# Patient Record
Sex: Male | Born: 1955
Health system: Southern US, Community
[De-identification: ages and names within clinical notes are randomized; demographics above are authoritative.]

## PROBLEM LIST (undated history)

## (undated) ENCOUNTER — Emergency Department (HOSPITAL_COMMUNITY): Payer: PPO

## (undated) DIAGNOSIS — R0600 Dyspnea, unspecified: Secondary | ICD-10-CM

## (undated) DIAGNOSIS — M1990 Unspecified osteoarthritis, unspecified site: Secondary | ICD-10-CM

## (undated) DIAGNOSIS — J189 Pneumonia, unspecified organism: Secondary | ICD-10-CM

## (undated) DIAGNOSIS — G43909 Migraine, unspecified, not intractable, without status migrainosus: Secondary | ICD-10-CM

## (undated) DIAGNOSIS — E7800 Pure hypercholesterolemia, unspecified: Secondary | ICD-10-CM

## (undated) DIAGNOSIS — I739 Peripheral vascular disease, unspecified: Secondary | ICD-10-CM

## (undated) DIAGNOSIS — I6529 Occlusion and stenosis of unspecified carotid artery: Secondary | ICD-10-CM

## (undated) DIAGNOSIS — N200 Calculus of kidney: Secondary | ICD-10-CM

## (undated) DIAGNOSIS — I214 Non-ST elevation (NSTEMI) myocardial infarction: Secondary | ICD-10-CM

## (undated) DIAGNOSIS — I25810 Atherosclerosis of coronary artery bypass graft(s) without angina pectoris: Secondary | ICD-10-CM

## (undated) DIAGNOSIS — Q2730 Arteriovenous malformation, site unspecified: Secondary | ICD-10-CM

## (undated) DIAGNOSIS — D500 Iron deficiency anemia secondary to blood loss (chronic): Secondary | ICD-10-CM

## (undated) DIAGNOSIS — I10 Essential (primary) hypertension: Secondary | ICD-10-CM

## (undated) DIAGNOSIS — I639 Cerebral infarction, unspecified: Secondary | ICD-10-CM

## (undated) DIAGNOSIS — Z86010 Personal history of colonic polyps: Secondary | ICD-10-CM

## (undated) DIAGNOSIS — I119 Hypertensive heart disease without heart failure: Secondary | ICD-10-CM

## (undated) DIAGNOSIS — D126 Benign neoplasm of colon, unspecified: Secondary | ICD-10-CM

## (undated) DIAGNOSIS — I209 Angina pectoris, unspecified: Secondary | ICD-10-CM

## (undated) DIAGNOSIS — Z8719 Personal history of other diseases of the digestive system: Secondary | ICD-10-CM

## (undated) DIAGNOSIS — E1110 Type 2 diabetes mellitus with ketoacidosis without coma: Secondary | ICD-10-CM

## (undated) DIAGNOSIS — E119 Type 2 diabetes mellitus without complications: Secondary | ICD-10-CM

## (undated) DIAGNOSIS — Z9289 Personal history of other medical treatment: Secondary | ICD-10-CM

## (undated) HISTORY — DX: Cerebral infarction, unspecified: I63.9

## (undated) HISTORY — DX: Peripheral vascular disease, unspecified: I73.9

## (undated) HISTORY — DX: Calculus of kidney: N20.0

## (undated) HISTORY — DX: Iron deficiency anemia secondary to blood loss (chronic): D50.0

## (undated) HISTORY — DX: Occlusion and stenosis of unspecified carotid artery: I65.29

## (undated) HISTORY — DX: Benign neoplasm of colon, unspecified: D12.6

## (undated) HISTORY — DX: Atherosclerosis of coronary artery bypass graft(s) without angina pectoris: I25.810

## (undated) HISTORY — DX: Non-ST elevation (NSTEMI) myocardial infarction: I21.4

## (undated) HISTORY — DX: Type 2 diabetes mellitus without complications: E11.9

## (undated) HISTORY — DX: Hypertensive heart disease without heart failure: I11.9

## (undated) HISTORY — DX: Angina pectoris, unspecified: I20.9

## (undated) HISTORY — DX: Personal history of colonic polyps: Z86.010

---

## 2005-10-04 HISTORY — PX: CORONARY ARTERY BYPASS GRAFT: SHX141

## 2006-04-04 ENCOUNTER — Ambulatory Visit: Payer: Self-pay | Admitting: Family Medicine

## 2006-04-04 ENCOUNTER — Ambulatory Visit: Payer: Self-pay | Admitting: *Deleted

## 2006-07-01 ENCOUNTER — Ambulatory Visit: Payer: Self-pay | Admitting: Family Medicine

## 2006-07-04 ENCOUNTER — Ambulatory Visit (HOSPITAL_COMMUNITY): Admission: RE | Admit: 2006-07-04 | Discharge: 2006-07-04 | Payer: Self-pay | Admitting: Family Medicine

## 2006-07-26 ENCOUNTER — Ambulatory Visit: Payer: Self-pay | Admitting: Family Medicine

## 2006-08-18 ENCOUNTER — Ambulatory Visit: Payer: Self-pay | Admitting: Family Medicine

## 2006-10-12 ENCOUNTER — Ambulatory Visit: Payer: Self-pay | Admitting: Family Medicine

## 2006-11-15 ENCOUNTER — Ambulatory Visit: Payer: Self-pay | Admitting: Family Medicine

## 2007-03-16 ENCOUNTER — Ambulatory Visit: Payer: Self-pay | Admitting: Family Medicine

## 2007-06-21 ENCOUNTER — Encounter (INDEPENDENT_AMBULATORY_CARE_PROVIDER_SITE_OTHER): Payer: Self-pay | Admitting: *Deleted

## 2007-12-18 ENCOUNTER — Ambulatory Visit: Payer: Self-pay | Admitting: Family Medicine

## 2007-12-18 LAB — CONVERTED CEMR LAB
ALT: 23 units/L (ref 0–53)
AST: 15 units/L (ref 0–37)
Alkaline Phosphatase: 72 units/L (ref 39–117)
CO2: 24 meq/L (ref 19–32)
Cholesterol: 116 mg/dL (ref 0–200)
Creatinine, Ser: 0.92 mg/dL (ref 0.40–1.50)
Free T4: 0.99 ng/dL (ref 0.89–1.80)
LDL Cholesterol: 66 mg/dL (ref 0–99)
PSA: 0.8 ng/mL (ref 0.10–4.00)
Sodium: 140 meq/L (ref 135–145)
TSH: 1.338 microintl units/mL (ref 0.350–5.50)
Total Bilirubin: 0.3 mg/dL (ref 0.3–1.2)
Total CHOL/HDL Ratio: 4.3
Total Protein: 6.5 g/dL (ref 6.0–8.3)
VLDL: 23 mg/dL (ref 0–40)

## 2008-01-24 ENCOUNTER — Ambulatory Visit: Payer: Self-pay | Admitting: Family Medicine

## 2008-09-20 ENCOUNTER — Ambulatory Visit: Payer: Self-pay | Admitting: Family Medicine

## 2008-10-24 ENCOUNTER — Encounter: Payer: Self-pay | Admitting: Family Medicine

## 2008-10-24 ENCOUNTER — Ambulatory Visit: Payer: Self-pay | Admitting: Family Medicine

## 2008-10-24 LAB — CONVERTED CEMR LAB
ALT: 18 units/L (ref 0–53)
AST: 17 units/L (ref 0–37)
Albumin: 3.9 g/dL (ref 3.5–5.2)
Alkaline Phosphatase: 62 units/L (ref 39–117)
BUN: 11 mg/dL (ref 6–23)
Basophils Absolute: 0 10*3/uL (ref 0.0–0.1)
Basophils Relative: 0 % (ref 0–1)
Calcium: 8.9 mg/dL (ref 8.4–10.5)
Chloride: 107 meq/L (ref 96–112)
Eosinophils Absolute: 0.2 10*3/uL (ref 0.0–0.7)
HDL: 36 mg/dL — ABNORMAL LOW (ref >39)
LDL Cholesterol: 129 mg/dL — ABNORMAL HIGH (ref 0–99)
MCHC: 32.2 g/dL (ref 30.0–36.0)
MCV: 86.5 fL (ref 78.0–100.0)
Monocytes Relative: 11 % (ref 3–12)
Neutro Abs: 1.8 10*3/uL (ref 1.7–7.7)
Neutrophils Relative %: 36 % — ABNORMAL LOW (ref 43–77)
PSA: 0.58 ng/mL (ref 0.10–4.00)
Platelets: 211 10*3/uL (ref 150–400)
Potassium: 4.3 meq/L (ref 3.5–5.3)
RDW: 15.6 % — ABNORMAL HIGH (ref 11.5–15.5)
TSH: 0.998 microintl units/mL (ref 0.350–4.50)
Total CHOL/HDL Ratio: 5.3

## 2008-12-19 ENCOUNTER — Ambulatory Visit: Payer: Self-pay | Admitting: Family Medicine

## 2009-09-18 ENCOUNTER — Emergency Department (HOSPITAL_COMMUNITY): Admission: EM | Admit: 2009-09-18 | Discharge: 2009-09-18 | Payer: Self-pay | Admitting: Family Medicine

## 2014-04-12 ENCOUNTER — Encounter (HOSPITAL_COMMUNITY): Payer: Self-pay | Admitting: Emergency Medicine

## 2014-04-12 ENCOUNTER — Emergency Department (INDEPENDENT_AMBULATORY_CARE_PROVIDER_SITE_OTHER)
Admission: EM | Admit: 2014-04-12 | Discharge: 2014-04-12 | Disposition: A | Discharge status: Home / Self Care | Payer: PRIVATE HEALTH INSURANCE | Source: Home / Self Care | Attending: Emergency Medicine | Admitting: Emergency Medicine

## 2014-04-12 DIAGNOSIS — R05 Cough: Secondary | ICD-10-CM

## 2014-04-12 DIAGNOSIS — N400 Enlarged prostate without lower urinary tract symptoms: Secondary | ICD-10-CM

## 2014-04-12 DIAGNOSIS — R059 Cough, unspecified: Secondary | ICD-10-CM

## 2014-04-12 DIAGNOSIS — E7800 Pure hypercholesterolemia, unspecified: Secondary | ICD-10-CM

## 2014-04-12 DIAGNOSIS — I10 Essential (primary) hypertension: Secondary | ICD-10-CM

## 2014-04-12 HISTORY — DX: Essential (primary) hypertension: I10

## 2014-04-12 HISTORY — DX: Pure hypercholesterolemia, unspecified: E78.00

## 2014-04-12 LAB — POCT I-STAT, CHEM 8
BUN: 7 mg/dL (ref 6–23)
CHLORIDE: 104 meq/L (ref 96–112)
CREATININE: 1.1 mg/dL (ref 0.50–1.35)
Calcium, Ion: 1.3 mmol/L — ABNORMAL HIGH (ref 1.12–1.23)
Glucose, Bld: 78 mg/dL (ref 70–99)
HCT: 45 % (ref 39.0–52.0)
Hemoglobin: 15.3 g/dL (ref 13.0–17.0)
POTASSIUM: 4.1 meq/L (ref 3.7–5.3)
SODIUM: 140 meq/L (ref 137–147)
TCO2: 27 mmol/L (ref 0–100)

## 2014-04-12 MED ORDER — LISINOPRIL 5 MG PO TABS: 5.0000 mg | ORAL_TABLET | Freq: Every day | ORAL | Status: DC

## 2014-04-12 MED ORDER — LOVASTATIN 40 MG PO TABS: 40.0000 mg | ORAL_TABLET | Freq: Every day | ORAL | Status: DC

## 2014-04-12 MED ORDER — TERAZOSIN HCL 1 MG PO CAPS: 1.0000 mg | ORAL_CAPSULE | Freq: Every day | ORAL | Status: DC

## 2014-04-12 MED ORDER — HYDROCHLOROTHIAZIDE 12.5 MG PO TABS: 12.5000 mg | ORAL_TABLET | Freq: Every day | ORAL | Status: DC

## 2014-04-12 MED ORDER — FLUTICASONE PROPIONATE 50 MCG/ACT NA SUSP: 2.0000 | Freq: Every day | NASAL | Status: DC

## 2014-04-12 MED ORDER — FEXOFENADINE HCL 180 MG PO TABS: 180.0000 mg | ORAL_TABLET | Freq: Every day | ORAL | Status: DC

## 2014-04-12 MED ORDER — DICLOFENAC SODIUM 75 MG PO TBEC: 75.0000 mg | DELAYED_RELEASE_TABLET | Freq: Two times a day (BID) | ORAL | Status: DC

## 2014-04-12 MED ORDER — METOPROLOL TARTRATE 50 MG PO TABS: 50.0000 mg | ORAL_TABLET | Freq: Two times a day (BID) | ORAL | Status: DC

## 2014-04-12 NOTE — ED Notes (Signed)
Pt     Reports       He  Is  Out of  His     Fluid  Pill  And  bp  Pill  He has  Not  Had      Timely  PCP   followup               He  Is  Awake  And  Alert  And  Oriented   At  This  Time

## 2014-04-12 NOTE — Discharge Instructions (Signed)
Blood pressure over the ideal can put you at higher risk for stroke, heart disease, and kidney failure.  For this reason, it's important to try to get your blood pressure as close as possible to the ideal.  The ideal blood pressure is 120/80.  Blood pressures from 469-629 systolic over 52-84 diastolic are labeled as "prehypertension."  This means you are at higher risk of developing hypertension in the future.  Blood pressures in this range are not treated with medication, but lifestyle changes are recommended to prevent progression to hypertension.  Blood pressures of 132 and above systolic over 90 and above diastolic are classified as hypertension and are treated with medications.  Lifestyle changes which can benefit both prehypertension and hypertension include the following:   Salt and sodium restriction.  Weight loss.  Regular exercise.  Avoidance of tobacco.  Avoidance of excess alcohol.  The "D.A.S.H" diet.   People with hypertension and prehypertension should limit their salt intake to less than 1500 mg daily.  Reading the nutrition information on the label of many prepared foods can give you an idea of how much sodium you're consuming at each meal.  Remember that the most important number on the nutrition information is the serving size.  It may be smaller than you think.  Try to avoid adding extra salt at the table.  You may add small amounts of salt while cooking.  Remember that salt is an acquired taste and you may get used to a using a whole lot less salt than you are using now.  Using less salt lets the food's natural flavors come through.  You might want to consider using salt substitutes, potassium chloride, pepper, or blends of herbs and spices to enhance the flavor of your food.  Foods that contain the most salt include: processed meats (like ham, bacon, lunch meat, sausage, hot dogs, and breakfast meat), chips, pretzels, salted nuts, soups, salty snacks, canned foods, junk  food, fast food, restaurant food, mustard, pickles, pizza, popcorn, soy sauce, and worcestershire sauce--quite a list!  You might ask, "Is there anything I can eat?"  The answer is, "yes."  Fruits and vegetables are usually low in salt.  Fresh is better than frozen which is better than canned.  If you have canned vegetables, you can cut down on the salt content by rinsing them in tap water 3 times before cooking.     Weight loss is the second thing you can do to lower your blood pressure.  Getting to and maintaining ideal weight will often normalize your blood pressure and allow you to avoid medications, entirely, cut way down on your dosage of medications, or allow to wean off your meds.  (Note, this should only be done under the supervision of your primary care doctor.)  Of course, weight loss takes time and you may need to be on medication in the meantime.  You shoot for a body mass index of 20-25.  When you go to the urgent care or to your primary care doctor, they should calculate your BMI.  If you don't know what it is, ask.  You can calculate your BMI with the following formula:  Weight in pounds x 703/ (height in inches) x (height in inches).  There are many good diets out there: Weight Watchers and the D.A.S.H. Diet are the best, but often, just modifying a few factors can be helpful:  Don't skip meals, don't eat out, and keeping a food diary.  I do not recommend  fad diets or diet pills which often raise blood pressure.  ° °· Everyone should get regular exercise, but this is particularly important for people with high blood pressure.  Just about any exercise is good.  The only exercise which may be harmful is lifting extreme heavy weights.  I recommend moderate exercise such as walking for 30 minutes 5 days a week.  Going to the gym for a 50 minute workout 3 times a week is also good.  This amounts to 150 minutes of exercise weekly. ° °· Anyone with high blood pressure should avoid any use of tobacco.   Tobacco use does not elevate blood pressure, but it increases the risk of heart disease and stroke.  If you are interested in quitting, discuss with your doctor how to quit.  If you are not interested in quitting, ask yourself, "What would my life be like in 10 years if I continue to smoke?"  "How will I know when it is time to quit?"  "How would my life be better if I were to quit." ° °· Excess alcohol intake can raise the blood pressure.  The safe alcohol intake is 2 drinks or less per day for men and 1 drink per day or less for women. ° °· There is a very good diet which I recommend that has been designed for people with blood pressure called the D.A.S.H. Diet (dietary approaches to stop hypertension).  It consists of fruits, vegetables, lean meats, low fat dairy, whole grains, nuts and seeds.  It is very low in salt and sodium.  It has also been found to have other beneficial health effects such as lowering cholesterol and helping lose weight.  It has been developed by the National Institutes of Health and can be downloaded from the internet without any cost. Just do a web search on "D.A.S.H. Diet." or go the NIH website (www.nih.gov).  There are also cookbooks and diet plans that can be gotten from [employer] to help you with this diet. ° °DASH Eating Plan °DASH stands for "Dietary Approaches to Stop Hypertension." The DASH eating plan is a healthy eating plan that has been shown to reduce high blood pressure (hypertension). Additional health benefits may include reducing the risk of type 2 diabetes mellitus, heart disease, and stroke. The DASH eating plan may also help with weight loss. °WHAT DO I NEED TO KNOW ABOUT THE DASH EATING PLAN? °For the DASH eating plan, you will follow these general guidelines: °· Choose foods with a percent daily value for sodium of less than 5% (as listed on the food label). °· Use salt-free seasonings or herbs instead of table salt or sea salt. °· Check with your health care provider  or pharmacist before using salt substitutes. °· Eat lower-sodium products, often labeled as "lower sodium" or "no salt added." °· Eat fresh foods. °· Eat more vegetables, fruits, and low-fat dairy products. °· Choose whole grains. Look for the word "whole" as the first word in the ingredient list. °· Choose fish and skinless chicken or turkey more often than red meat. Limit fish, poultry, and meat to 6 oz (170 g) each day. °· Limit sweets, desserts, sugars, and sugary drinks. °· Choose heart-healthy fats. °· Limit cheese to 1 oz (28 g) per day. °· Eat more home-cooked food and less restaurant, buffet, and fast food. °· Limit fried foods. °· Cook foods using methods other than frying. °· Limit canned vegetables. If you do use them, rinse them well to decrease   the sodium.  When eating at a restaurant, ask that your food be prepared with less salt, or no salt if possible. WHAT FOODS CAN I EAT? Seek help from a dietitian for individual calorie needs. Grains Whole grain or whole wheat bread. Brown rice. Whole grain or whole wheat pasta. Quinoa, bulgur, and whole grain cereals. Low-sodium cereals. Corn or whole wheat flour tortillas. Whole grain cornbread. Whole grain crackers. Low-sodium crackers. Vegetables Fresh or frozen vegetables (raw, steamed, roasted, or grilled). Low-sodium or reduced-sodium tomato and vegetable juices. Low-sodium or reduced-sodium tomato sauce and paste. Low-sodium or reduced-sodium canned vegetables.  Fruits All fresh, canned (in natural juice), or frozen fruits. Meat and Other Protein Products Ground beef (85% or leaner), grass-fed beef, or beef trimmed of fat. Skinless chicken or [place]. Ground chicken or [place]. Pork trimmed of fat. All fish and seafood. Eggs. Dried beans, peas, or lentils. Unsalted nuts and seeds. Unsalted canned beans. Dairy Low-fat dairy products, such as skim or 1% milk, 2% or reduced-fat cheeses, low-fat ricotta or cottage cheese, or plain low-fat yogurt.  Low-sodium or reduced-sodium cheeses. Fats and Oils Tub margarines without trans fats. Light or reduced-fat mayonnaise and salad dressings (reduced sodium). Avocado. Safflower, olive, or canola oils. Natural peanut or almond butter. Other Unsalted popcorn and pretzels. The items listed above may not be a complete list of recommended foods or beverages. Contact your dietitian for more options. WHAT FOODS ARE NOT RECOMMENDED? Grains White bread. White pasta. White rice. Refined cornbread. Bagels and croissants. Crackers that contain trans fat. Vegetables Creamed or fried vegetables. Vegetables in a cheese sauce. Regular canned vegetables. Regular canned tomato sauce and paste. Regular tomato and vegetable juices. Fruits Dried fruits. Canned fruit in light or heavy syrup. Fruit juice. Meat and Other Protein Products Fatty cuts of meat. Ribs, chicken wings, bacon, sausage, bologna, salami, chitterlings, fatback, hot dogs, bratwurst, and packaged luncheon meats. Salted nuts and seeds. Canned beans with salt. Dairy Whole or 2% milk, cream, half-and-half, and cream cheese. Whole-fat or sweetened yogurt. Full-fat cheeses or blue cheese. Nondairy creamers and whipped toppings. Processed cheese, cheese spreads, or cheese curds. Condiments Onion and garlic salt, seasoned salt, table salt, and sea salt. Canned and packaged gravies. Worcestershire sauce. Tartar sauce. Barbecue sauce. Teriyaki sauce. Soy sauce, including reduced sodium. Steak sauce. Fish sauce. Oyster sauce. Cocktail sauce. Horseradish. Ketchup and mustard. Meat flavorings and tenderizers. Bouillon cubes. Hot sauce. Tabasco sauce. Marinades. Taco seasonings. Relishes. Fats and Oils Butter, stick margarine, lard, shortening, ghee, and bacon fat. Coconut, palm kernel, or palm oils. Regular salad dressings. Other Pickles and olives. Salted popcorn and pretzels. The items listed above may not be a complete list of foods and beverages to avoid.  Contact your dietitian for more information. WHERE CAN I FIND MORE INFORMATION? National Heart, Lung, and Blood Institute: travelstabloid.com Document Released: 09/09/2011 Document Revised: 09/25/2013 Document Reviewed: 07/25/2013 [facility] Patient Information 2015 [place], [place]. This information is not intended to replace advice given to you by your health care provider. Make sure you discuss any questions you have with your health care provider.

## 2014-04-12 NOTE — ED Provider Notes (Signed)
Chief Complaint   Chief Complaint  Patient presents with  . Medication Refill    History of Present Illness   Jason Velasquez is a 58 year old male who comes in today for refills on some medications. He is on hydrochlorothiazide 12.5 mg once a day and lisinopril 5 mg a day for his blood pressure as well as metoprolol tartarate 50 mg twice a day. 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 He denies any side effects from his medications. Specifically his head no dizziness, cough, muscle aches, syncope, presyncope, or abdominal pain. He denies any headache, he states his vision is sometimes blurry, he is occasionally short of breath although he describes this as being more nasal congestion, he denies any coughing or wheezing. There's been no chest pain. He has occasional dizzy spells. Also he notes numbness of his tongue at times. He's had a one-month history of sneezing, nasal congestion, rhinorrhea, and intermittent cough.  Review of Systems     Other than as noted above, the patient denies any of the following symptoms: Systemic:  No fever, chills, fatigue, myalgias, headache, or anorexia. Eye:  No redness, pain or drainage. ENT:  No earache, nasal congestion, rhinorrhea, sinus pressure, or sore throat. Lungs:  No cough, sputum production, wheezing, shortness of breath.  Cardiovascular:  No chest pain, palpitations, or syncope. GI:  No nausea, vomiting, abdominal pain or diarrhea. GU:  No dysuria, frequency, or hematuria. Skin:  No rash or pruritis.   [place]     Past medical history, family history, social history, meds, and allergies were reviewed.    Physical Examination    Vital signs:  BP 147/91  Pulse 82  Temp(Src) 98 F (36.7 C) (Oral)  Resp 14  SpO2 99% General:  Alert, in no distress. Eye:  PERRL, full EOMs.  Lids and conjunctivas were normal. ENT:  TMs and canals  were normal, without erythema or inflammation.  Nasal mucosa was clear and uncongested, without drainage.  Mucous membranes were moist.  Pharynx was clear, without exudate or drainage.  There were no oral ulcerations or lesions. Neck:  Supple, no adenopathy, tenderness or mass. Thyroid was normal. Lungs:  No respiratory distress.  Lungs were clear to auscultation, without wheezes, rales or rhonchi.  Breath sounds were clear and equal bilaterally. Heart:  Regular rhythm, without gallops, murmers or rubs. Abdomen:  Soft, flat, and non-tender to palpation.  No hepatosplenomagaly or mass. Skin:  Clear, warm, and dry, without rash or lesions.  Labs    Results for orders placed during the hospital encounter of 04/12/14  POCT I-STAT, CHEM 8      Result Value Ref Range   Sodium 140  137 - 147 mEq/L   Potassium 4.1  3.7 - 5.3 mEq/L   Chloride 104  96 - 112 mEq/L   BUN 7  6 - 23 mg/dL   Creatinine, Ser 1.10  0.50 - 1.35 mg/dL   Glucose, Bld 78  70 - 99 mg/dL   Calcium, Ion 1.30 (*) 1.12 - 1.23 mmol/L   TCO2 27  0 - 100 mmol/L   Hemoglobin 15.3  13.0 - 17.0 g/dL   HCT 45.0  39.0 - 52.0 %    Assessment   The primary encounter diagnosis was Essential hypertension. Diagnoses of Hypercholesterolemia, BPH (benign prostatic hyperplasia), and Cough were also pertinent to this visit.  The patient  was provided with enough refills to get him through the next several months and suggest [facility].  Plan     1.  Meds:  The following meds were prescribed:   Discharge Medication List as of 04/12/2014  6:08 PM    START taking these medications   Details  diclofenac (VOLTAREN) 75 MG EC tablet Take 1 tablet (75 mg total) by mouth 2 (two) times daily., Starting 04/12/2014, Until Discontinued, Normal    fexofenadine (ALLEGRA) 180 MG tablet Take 1 tablet (180 mg total) by mouth daily., Starting 04/12/2014, Until Discontinued, Normal    fluticasone (FLONASE) 50 MCG/ACT nasal  spray Place 2 sprays into both nostrils daily., Starting 04/12/2014, Until Discontinued, Normal    hydrochlorothiazide (HYDRODIURIL) 12.5 MG tablet Take 1 tablet (12.5 mg total) by mouth daily., Starting 04/12/2014, Until Discontinued, Normal    !! lisinopril (PRINIVIL,ZESTRIL) 5 MG tablet Take 1 tablet (5 mg total) by mouth daily., Starting 04/12/2014, Until Discontinued, Normal    !! lovastatin (MEVACOR) 40 MG tablet Take 1 tablet (40 mg total) by mouth at bedtime., Starting 04/12/2014, Until Discontinued, Normal    !! metoprolol (LOPRESSOR) 50 MG tablet Take 1 tablet (50 mg total) by mouth 2 (two) times daily., Starting 04/12/2014, Until Discontinued, Normal    terazosin (HYTRIN) 1 MG capsule Take 1 capsule (1 mg total) by mouth at bedtime., Starting 04/12/2014, Until Discontinued, Normal     !! - Potential duplicate medications found. Please discuss with provider.      2.  Patient Education/Counseling:  The patient was given appropriate handouts, self care instructions, and instructed in symptomatic relief.    3.  Follow up:  The patient was told to follow up here if no better in 3 to 4 days, or sooner if becoming worse in any way, and given some red flag symptoms such as headache, vision changes, shortness of breath, chest pain, or dizziness which would prompt immediate return.  [facility].        Harden Mo, MD 04/12/14 2213

## 2014-06-25 ENCOUNTER — Ambulatory Visit: Payer: Self-pay

## 2014-06-25 ENCOUNTER — Ambulatory Visit (INDEPENDENT_AMBULATORY_CARE_PROVIDER_SITE_OTHER): Payer: No Typology Code available for payment source | Admitting: Internal Medicine

## 2014-06-25 ENCOUNTER — Encounter: Payer: Self-pay | Admitting: Internal Medicine

## 2014-06-25 VITALS — BP 138/87 | HR 68 | Temp 98.0°F | Ht 70.5 in | Wt 207.0 lb

## 2014-06-25 DIAGNOSIS — I209 Angina pectoris, unspecified: Secondary | ICD-10-CM

## 2014-06-25 DIAGNOSIS — I25708 Atherosclerosis of coronary artery bypass graft(s), unspecified, with other forms of angina pectoris: Secondary | ICD-10-CM

## 2014-06-25 DIAGNOSIS — Z0000 Encounter for general adult medical examination without abnormal findings: Secondary | ICD-10-CM

## 2014-06-25 DIAGNOSIS — I10 Essential (primary) hypertension: Secondary | ICD-10-CM

## 2014-06-25 DIAGNOSIS — I25810 Atherosclerosis of coronary artery bypass graft(s) without angina pectoris: Secondary | ICD-10-CM

## 2014-06-25 HISTORY — DX: Atherosclerosis of coronary artery bypass graft(s) without angina pectoris: I25.810

## 2014-06-25 LAB — LIPID PANEL
CHOL/HDL RATIO: 6 ratio
Cholesterol: 162 mg/dL (ref 0–200)
HDL: 27 mg/dL — ABNORMAL LOW (ref >39)
LDL CALC: 61 mg/dL (ref 0–99)
Triglycerides: 370 mg/dL — ABNORMAL HIGH (ref <150)
VLDL: 74 mg/dL — AB (ref 0–40)

## 2014-06-25 LAB — COMPLETE METABOLIC PANEL WITH GFR
ALT: 46 U/L (ref 0–53)
AST: 28 U/L (ref 0–37)
Albumin: 4.1 g/dL (ref 3.5–5.2)
Alkaline Phosphatase: 68 U/L (ref 39–117)
BUN: 13 mg/dL (ref 6–23)
CALCIUM: 9.5 mg/dL (ref 8.4–10.5)
CHLORIDE: 100 meq/L (ref 96–112)
CO2: 27 meq/L (ref 19–32)
Creat: 1.2 mg/dL (ref 0.50–1.35)
GFR, EST AFRICAN AMERICAN: 76 mL/min
GFR, Est Non African American: 66 mL/min
Glucose, Bld: 112 mg/dL — ABNORMAL HIGH (ref 70–99)
POTASSIUM: 4.1 meq/L (ref 3.5–5.3)
SODIUM: 138 meq/L (ref 135–145)
TOTAL PROTEIN: 6.6 g/dL (ref 6.0–8.3)
Total Bilirubin: 0.4 mg/dL (ref 0.2–1.2)

## 2014-06-25 LAB — GLUCOSE, CAPILLARY: Glucose-Capillary: 120 mg/dL — ABNORMAL HIGH (ref 70–99)

## 2014-06-25 LAB — POCT GLYCOSYLATED HEMOGLOBIN (HGB A1C): Hemoglobin A1C: 6.5

## 2014-06-25 LAB — TSH: TSH: 1.582 u[IU]/mL (ref 0.350–4.500)

## 2014-06-25 MED ORDER — METOPROLOL TARTRATE 50 MG PO TABS: 50.0000 mg | ORAL_TABLET | Freq: Two times a day (BID) | ORAL | Status: DC

## 2014-06-25 MED ORDER — LOVASTATIN 40 MG PO TABS: 40.0000 mg | ORAL_TABLET | Freq: Every day | ORAL | Status: DC

## 2014-06-25 MED ORDER — TERAZOSIN HCL 1 MG PO CAPS: 1.0000 mg | ORAL_CAPSULE | Freq: Every day | ORAL | Status: DC

## 2014-06-25 MED ORDER — ASPIRIN 81 MG PO TABS: 81.0000 mg | ORAL_TABLET | Freq: Every day | ORAL | Status: DC

## 2014-06-25 MED ORDER — HYDROCHLOROTHIAZIDE 12.5 MG PO TABS: 12.5000 mg | ORAL_TABLET | Freq: Every day | ORAL | Status: DC

## 2014-06-25 NOTE — Patient Instructions (Addendum)
General Instructions: We will be doing some blood work on you today.  We will also like to get information form your former PCPs office to update our records since you will be establishing care here.  Thank you for bringing your medicines today. This helps [place] keep you safe from mistakes.    Exercise to Lose Weight Exercise and a healthy diet may help you lose weight. Your doctor may suggest specific exercises. EXERCISE IDEAS AND TIPS  Choose low-cost things you enjoy doing, such as walking, bicycling, or exercising to workout videos.  Take stairs instead of the elevator.  Walk during your lunch break.  Park your car further away from work or school.  Go to a gym or an exercise class.  Start with 5 to 10 minutes of exercise each day. Build up to 30 minutes of exercise 4 to 6 days a week.  Wear shoes with good support and comfortable clothes.  Stretch before and after working out.  Work out until you breathe harder and your heart beats faster.  Drink extra water when you exercise.  Do not do so much that you hurt yourself, feel dizzy, or get very short of breath. Exercises that burn about 150 calories:  Running 1  miles in 15 minutes.  Playing volleyball for 45 to 60 minutes.  Washing and waxing a car for 45 to 60 minutes.  Playing touch football for 45 minutes.  Walking 1  miles in 35 minutes.  Pushing a stroller 1  miles in 30 minutes.  Playing basketball for 30 minutes.  Raking leaves for 30 minutes.  Bicycling 5 miles in 30 minutes.  Walking 2 miles in 30 minutes.  Dancing for 30 minutes.  Shoveling snow for 15 minutes.  Swimming laps for 20 minutes.  Walking up stairs for 15 minutes.  Bicycling 4 miles in 15 minutes.  Gardening for 30 to 45 minutes.  Jumping rope for 15 minutes.  Washing windows or floors for 45 to 60 minutes.

## 2014-06-25 NOTE — Assessment & Plan Note (Signed)
Pt refused flu shot, never had it and doesn't want to start now. Get records from PCP on colonoscopy.

## 2014-06-25 NOTE — Assessment & Plan Note (Addendum)
BP Readings from Last 3 Encounters:  06/25/14 138/87  04/12/14 147/91    Lab Results  Component Value Date   NA 140 04/12/2014   K 4.1 04/12/2014   CREATININE 1.10 04/12/2014    Assessment: Blood pressure control:  Controlled Progress toward BP goal:   At goal Comments: Currently on Lisinopril- 5mg  daily, HCTZ- 12.5 mg daily, Metoprolol tartrate - 50mg  BID, also on terazosin- 1mg  daily ( No hx of BPH or prostate issues in the past).  Plan: Medications:  continue current medications Educational resources provided:   Self management tools provided:   Other plans: CMEt today. - TSH - Request records from former PCP. Pt signed release of information paper work today. - UA. - meet with deb Hill to sort out orange card and insurance.

## 2014-06-25 NOTE — Progress Notes (Signed)
Patient ID: Jason Velasquez, male   DOB: June 05, 1956, 59 y.o.   MRN: [id number]   Subjective:   Patient ID: Jason Velasquez male   DOB: Sep 12, 1956 58 y.o.   MRN: [id number]  HPI: Jason Velasquez is a 58 y.o. presented here today as a new patient to establish care here- Previous PCP- Dr Amalia Hailey blount, pt couldnt afford his PCP and had trouble seeing him and making appointments. He is currently unemployed. Pt therefore will like to establish care here. Pt has a past medical hx of HTN, CAD- s/p CADG in 2007, and hypercholesterolemia. Pt is very complaint with his medication. Was in the ED- 04/12/2014 to ask for refill of his medication, as he had run out.  Pt today is in good health, except for ben tired during the day, most days, which is very unusual for him, denies snoring at night, constipation, or skin changes, no cold intolerance, without chest pains, SOB, cough , headaches, fever, dysuria or frequency. Denies dizziness or falls. Takes flonase for occasional allergies. Denies diabetes hx.   HTN- Checks his blood pressure at home. Usually < 967R systolic. Denies dizziness. On metop, Lisinopril, terazosin, and HCTZ.  CAD- Had CABG- 2007, saw a cardiologist- about 6 months after his procedure. Has not followed up with them ever since. Quit smoking 2011, and has not smoked since then. Currently on Lisinopril, Aspirin, metop- tartrate. Doesn't take alcohol and doesn't use illicit drugs.   Past Medical History  Diagnosis Date  . Hypertension   . Hypercholesterolemia    Current Outpatient Prescriptions  Medication Sig Dispense Refill  . aspirin 81 MG tablet Take 1 tablet (81 mg total) by mouth daily.  30 tablet  5  . diclofenac (VOLTAREN) 75 MG EC tablet Take 1 tablet (75 mg total) by mouth 2 (two) times daily.  60 tablet  0  . fexofenadine (ALLEGRA) 180 MG tablet Take 1 tablet (180 mg total) by mouth daily.  30 tablet  3  . fluticasone (FLONASE) 50 MCG/ACT nasal spray Place 2 sprays into both nostrils daily.   16 g  3  . hydrochlorothiazide (HYDRODIURIL) 12.5 MG tablet Take 1 tablet (12.5 mg total) by mouth daily.  30 tablet  5  . lisinopril (PRINIVIL,ZESTRIL) 5 MG tablet Take 1 tablet (5 mg total) by mouth daily.  30 tablet  5  . lovastatin (MEVACOR) 40 MG tablet Take 1 tablet (40 mg total) by mouth at bedtime.  30 tablet  5  . metoprolol (LOPRESSOR) 50 MG tablet Take 1 tablet (50 mg total) by mouth 2 (two) times daily.  60 tablet  5  . NIACIN PO Take by mouth.      . terazosin (HYTRIN) 1 MG capsule Take 1 capsule (1 mg total) by mouth at bedtime.  30 capsule  5   No current facility-administered medications for this visit.   Family History  Problem Relation Age of Onset  . Hypertension Mother   . Diabetes Father   . Hypertension Sister    History   Social History  . Marital Status: Divorced    Spouse Name: N/A    Number of Children: N/A  . Years of Education: N/A   Social History Main Topics  . Smoking status: Former Smoker    Quit date: 10/04/2009  . Smokeless tobacco: None  . Alcohol Use: No  . Drug Use: None  . Sexual Activity: None   Other Topics Concern  . None   Social History Narrative  . None  Review of Systems: CONSTITUTIONAL- No Fever, weightloss, night sweat or change in appetite. SKIN- No Rash, colour changes or itching. HEAD- No Headache or dizziness. Mouth/throat- No Sorethroat, dentures, or bleeding gums. RESPIRATORY- No Cough or SOB. CARDIAC- No Palpitations, DOE, PND or chest pain. GI- No nausea, vomiting, diarrhoea, constipation, abd pain. URINARY- No Frequency, urgency, straining or dysuria. NEUROLOGIC- No Numbness, syncope, seizures or burning. [facility]- Denies depression or anxiety symptoms.  Objective:  Physical Exam: Filed Vitals:   06/25/14 0957  BP: 138/87  Pulse: 68  Temp: 98 F (36.7 C)  TempSrc: Oral  Height: 5' 10.5" (1.791 m)  Weight: 207 lb (93.895 kg)  SpO2: 99%   GENERAL- alert, pleasant , co-operative, appears as stated age,  not in any distress. HEENT- Atraumatic, normocephalic, PERRL, EOMI, oral mucosa appears moist, no cervical LN enlargement, thyroid does not appear enlarged, neck supple. CARDIAC- RRR, no murmurs, rubs or gallops. RESP- Moving equal volumes of air, and clear to auscultation bilaterally, no wheezes or crackles. ABDOMEN- Soft, nontender, no guarding or rebound, no palpable masses or organomegaly, bowel sounds present. BACK- Normal curvature of the spine, No tenderness along the vertebrae, no CVA tenderness. NEURO- No obvious Cr N abnormality, strenght upper and lower extremities- 5/5, Gait- Normal. EXTREMITIES- pulse 2+, symmetric, no pedal edema. SKIN- Warm, dry, No rash or lesion. PSYCH- Normal mood and affect, appropriate thought content and speech.  Assessment & Plan:  The patient's case and plan of care was discussed with attending physician, Dr. Eppie Gibson.  Please see problem based charting for assessment and plan.

## 2014-06-25 NOTE — Assessment & Plan Note (Signed)
Currently stable. On aspirin- 81mg  daily,Metop- tartrate- 50mg  BID, lisinopril- 5mg  daily. No chest pains. Pt say she has not had lab work done in over a year.  Plan- Refill of all medications given to patient. - Explained how our clinic operates and that he will see his PCP hopefully next visit. - Will check Hgba1c to rule out diabetes. - Lipid profile.

## 2014-06-26 LAB — URINALYSIS, ROUTINE W REFLEX MICROSCOPIC
Bilirubin Urine: NEGATIVE
GLUCOSE, UA: NEGATIVE mg/dL
Hgb urine dipstick: NEGATIVE
Ketones, ur: NEGATIVE mg/dL
LEUKOCYTES UA: NEGATIVE
Nitrite: NEGATIVE
PH: 5.5 (ref 5.0–8.0)
Protein, ur: NEGATIVE mg/dL
SPECIFIC GRAVITY, URINE: 1.027 (ref 1.005–1.030)
Urobilinogen, UA: 0.2 mg/dL (ref 0.0–1.0)

## 2014-06-26 NOTE — Progress Notes (Signed)
Case discussed with Dr. Emokpae soon after the resident saw the patient. We reviewed the resident's history and exam and pertinent patient test results. I agree with the assessment, diagnosis, and plan of care documented in the resident's note. 

## 2014-07-04 ENCOUNTER — Emergency Department (HOSPITAL_COMMUNITY): Payer: Self-pay

## 2014-07-04 ENCOUNTER — Emergency Department (HOSPITAL_COMMUNITY)
Admission: EM | Admit: 2014-07-04 | Discharge: 2014-07-04 | Disposition: A | Discharge status: Home / Self Care | Payer: Self-pay | Attending: Emergency Medicine | Admitting: Emergency Medicine

## 2014-07-04 ENCOUNTER — Encounter (HOSPITAL_COMMUNITY): Payer: Self-pay | Admitting: Emergency Medicine

## 2014-07-04 DIAGNOSIS — R200 Anesthesia of skin: Secondary | ICD-10-CM

## 2014-07-04 DIAGNOSIS — I6789 Other cerebrovascular disease: Secondary | ICD-10-CM | POA: Insufficient documentation

## 2014-07-04 DIAGNOSIS — Z7982 Long term (current) use of aspirin: Secondary | ICD-10-CM | POA: Insufficient documentation

## 2014-07-04 DIAGNOSIS — Z7951 Long term (current) use of inhaled steroids: Secondary | ICD-10-CM | POA: Insufficient documentation

## 2014-07-04 DIAGNOSIS — E780 Pure hypercholesterolemia: Secondary | ICD-10-CM | POA: Insufficient documentation

## 2014-07-04 DIAGNOSIS — Z791 Long term (current) use of non-steroidal anti-inflammatories (NSAID): Secondary | ICD-10-CM | POA: Insufficient documentation

## 2014-07-04 DIAGNOSIS — I10 Essential (primary) hypertension: Secondary | ICD-10-CM | POA: Insufficient documentation

## 2014-07-04 DIAGNOSIS — Z951 Presence of aortocoronary bypass graft: Secondary | ICD-10-CM | POA: Insufficient documentation

## 2014-07-04 DIAGNOSIS — R202 Paresthesia of skin: Secondary | ICD-10-CM

## 2014-07-04 DIAGNOSIS — Z87891 Personal history of nicotine dependence: Secondary | ICD-10-CM | POA: Insufficient documentation

## 2014-07-04 DIAGNOSIS — Z9889 Other specified postprocedural states: Secondary | ICD-10-CM | POA: Insufficient documentation

## 2014-07-04 DIAGNOSIS — Z79899 Other long term (current) drug therapy: Secondary | ICD-10-CM | POA: Insufficient documentation

## 2014-07-04 LAB — URINALYSIS, ROUTINE W REFLEX MICROSCOPIC
BILIRUBIN URINE: NEGATIVE
GLUCOSE, UA: NEGATIVE mg/dL
HGB URINE DIPSTICK: NEGATIVE
Ketones, ur: NEGATIVE mg/dL
Leukocytes, UA: NEGATIVE
Nitrite: NEGATIVE
PH: 5.5 (ref 5.0–8.0)
Protein, ur: NEGATIVE mg/dL
SPECIFIC GRAVITY, URINE: 1.02 (ref 1.005–1.030)
Urobilinogen, UA: 0.2 mg/dL (ref 0.0–1.0)

## 2014-07-04 LAB — CBC WITH DIFFERENTIAL/PLATELET
BASOS ABS: 0 10*3/uL (ref 0.0–0.1)
Basophils Relative: 0 % (ref 0–1)
EOS ABS: 0.1 10*3/uL (ref 0.0–0.7)
Eosinophils Relative: 3 % (ref 0–5)
HCT: 40.2 % (ref 39.0–52.0)
Hemoglobin: 13.3 g/dL (ref 13.0–17.0)
LYMPHS ABS: 2.1 10*3/uL (ref 0.7–4.0)
LYMPHS PCT: 46 % (ref 12–46)
MCH: 27.3 pg (ref 26.0–34.0)
MCHC: 33.1 g/dL (ref 30.0–36.0)
MCV: 82.5 fL (ref 78.0–100.0)
Monocytes Absolute: 0.4 10*3/uL (ref 0.1–1.0)
Monocytes Relative: 10 % (ref 3–12)
NEUTROS PCT: 41 % — AB (ref 43–77)
Neutro Abs: 1.8 10*3/uL (ref 1.7–7.7)
PLATELETS: 164 10*3/uL (ref 150–400)
RBC: 4.87 MIL/uL (ref 4.22–5.81)
RDW: 14.4 % (ref 11.5–15.5)
WBC: 4.4 10*3/uL (ref 4.0–10.5)

## 2014-07-04 LAB — COMPREHENSIVE METABOLIC PANEL
ALK PHOS: 66 U/L (ref 39–117)
ALT: 39 U/L (ref 0–53)
AST: 27 U/L (ref 0–37)
Albumin: 3.8 g/dL (ref 3.5–5.2)
Anion gap: 13 (ref 5–15)
BUN: 17 mg/dL (ref 6–23)
CALCIUM: 9 mg/dL (ref 8.4–10.5)
CO2: 24 mEq/L (ref 19–32)
Chloride: 102 mEq/L (ref 96–112)
Creatinine, Ser: 1.24 mg/dL (ref 0.50–1.35)
GFR calc non Af Amer: 62 mL/min — ABNORMAL LOW (ref >90)
GFR, EST AFRICAN AMERICAN: 72 mL/min — AB (ref >90)
GLUCOSE: 95 mg/dL (ref 70–99)
POTASSIUM: 4.2 meq/L (ref 3.7–5.3)
SODIUM: 139 meq/L (ref 137–147)
TOTAL PROTEIN: 7.1 g/dL (ref 6.0–8.3)
Total Bilirubin: 0.4 mg/dL (ref 0.3–1.2)

## 2014-07-04 LAB — CBG MONITORING, ED: GLUCOSE-CAPILLARY: 103 mg/dL — AB (ref 70–99)

## 2014-07-04 MED ORDER — KETOROLAC TROMETHAMINE 0.5 % OP SOLN
1.0000 [drp] | Freq: Four times a day (QID) | OPHTHALMIC | Status: DC
Start: 2014-07-04 — End: 2014-07-04

## 2014-07-04 MED ORDER — ERYTHROMYCIN 5 MG/GM OP OINT: 1.0000 "application " | TOPICAL_OINTMENT | Freq: Once | OPHTHALMIC | Status: DC

## 2014-07-04 NOTE — Discharge Instructions (Signed)
Continue taking 325 mg of aspirin daily for stroke prophylaxis. Followup with neurology. Return to the ER if you develop any sudden weakness, change in vision, slurred speech, loss of function, severe headache, nausea, vomiting, chest pain, shortness of breath.   Stroke Prevention Some medical conditions and behaviors are associated with an increased chance of having a stroke. You may prevent a stroke by making healthy choices and managing medical conditions. HOW CAN I REDUCE MY RISK OF HAVING A STROKE?   Stay physically active. Get at least 30 minutes of activity on most or all days.  Do not smoke. It may also be helpful to avoid exposure to secondhand smoke.  Limit alcohol use. Moderate alcohol use is considered to be:  No more than 2 drinks per day for men.  No more than 1 drink per day for nonpregnant women.  Eat healthy foods. This involves:  Eating 5 or more servings of fruits and vegetables a day.  Making dietary changes that address high blood pressure (hypertension), high cholesterol, diabetes, or obesity.  Manage your cholesterol levels.  Making food choices that are high in fiber and low in saturated fat, trans fat, and cholesterol may control cholesterol levels.  Take any prescribed medicines to control cholesterol as directed by your health care provider.  Manage your diabetes.  Controlling your carbohydrate and sugar intake is recommended to manage diabetes.  Take any prescribed medicines to control diabetes as directed by your health care provider.  Control your hypertension.  Making food choices that are low in salt (sodium), saturated fat, trans fat, and cholesterol is recommended to manage hypertension.  Take any prescribed medicines to control hypertension as directed by your health care provider.  Maintain a healthy weight.  Reducing calorie intake and making food choices that are low in sodium, saturated fat, trans fat, and cholesterol are recommended  to manage weight.  Stop drug abuse.  Avoid taking birth control pills.  Talk to your health care provider about the risks of taking birth control pills if you are over 22 years old, smoke, get migraines, or have ever had a blood clot.  Get evaluated for sleep disorders (sleep apnea).  Talk to your health care provider about getting a sleep evaluation if you snore a lot or have excessive sleepiness.  Take medicines only as directed by your health care provider.  For some people, aspirin or blood thinners (anticoagulants) are helpful in reducing the risk of forming abnormal blood clots that can lead to stroke. If you have the irregular heart rhythm of atrial fibrillation, you should be on a blood thinner unless there is a good reason you cannot take them.  Understand all your medicine instructions.  Make sure that other conditions (such as anemia or atherosclerosis) are addressed. SEEK IMMEDIATE MEDICAL CARE IF:   You have sudden weakness or numbness of the face, arm, or leg, especially on one side of the body.  Your face or eyelid droops to one side.  You have sudden confusion.  You have trouble speaking (aphasia) or understanding.  You have sudden trouble seeing in one or both eyes.  You have sudden trouble walking.  You have dizziness.  You have a loss of balance or coordination.  You have a sudden, severe headache with no known cause.  You have new chest pain or an irregular heartbeat. Any of these symptoms may represent a serious problem that is an emergency. Do not wait to see if the symptoms will go away. Get medical  help at once. Call your local emergency services (911 in [place] Do not drive yourself to the hospital. Document Released: 10/28/2004 Document Revised: 02/04/2014 Document Reviewed: 03/23/2013 [facility] Patient Information 2015 [place], [place]. This information is not intended to replace advice given to you by your health care provider. Make sure you discuss  any questions you have with your health care provider.

## 2014-07-04 NOTE — Discharge Planning (Signed)
[place] [facility]   Patient is a current orange card holder at [facility]. Follow up appointment made with the practice for  Friday Oct 9,2015 at 3:45pm, pt verbalized understanding of the appointment. My contact information provided for any future questions or concerns. No other needs identified at this time.

## 2014-07-04 NOTE — ED Provider Notes (Signed)
CSN: [id number]     Arrival date & time 07/04/14  7371 History   First MD Initiated Contact with Patient 07/04/14 0935     Chief Complaint  Patient presents with  . Weakness     (Consider location/radiation/quality/duration/timing/severity/associated sxs/prior Treatment) HPI Mr. Fauver is a 58 year old male with past medical history of hypertension, hypercholesterolemia who presents to the ER today with one day of numbness. Patient states she's eating dinner last night, and noticed the numbness began in his mouth. Patient states he was "unable to taste on the left side of his mouth". He stated the numbness gradually progressed into his neck, trunk and left leg, sparing his left arm. He reports mild associated, intermittent headache this morning, and states the headache resolves spontaneously. Patient also reports some mild associated weakness in his left leg, however he has continued to be able to bear weight, and has since not affected his gait. He also reports some mild associated dizziness, and states this has been ongoing for "a long time". Patient also states that 8 years ago he experienced similar symptoms with the numbness in the left side of his body in the mouth which resolved spontaneously. Patient states he did not seek any treatment at that time. Patient denies any associated fever, nausea, vomiting, chest pain, shortness of breath, syncope, blurred vision. Patient denies history of cancer, IV drug use, saddle anesthesia.   Past Medical History  Diagnosis Date  . Hypertension   . Hypercholesterolemia    Past Surgical History  Procedure Laterality Date  . Cardiac surgery    . Coronary artery bypass graft     Family History  Problem Relation Age of Onset  . Hypertension Mother   . Diabetes Father   . Hypertension Sister    History  Substance Use Topics  . Smoking status: Former Smoker    Quit date: 10/04/2009  . Smokeless tobacco: Not on file  . Alcohol Use: No     Review of Systems  Constitutional: Negative for fever.  HENT: Negative for trouble swallowing.   Eyes: Negative for visual disturbance.  Respiratory: Negative for shortness of breath.   Cardiovascular: Negative for chest pain.  Gastrointestinal: Negative for nausea, vomiting and abdominal pain.  Genitourinary: Negative for dysuria.  Musculoskeletal: Negative for neck pain.  Skin: Negative for rash.  Neurological: Positive for numbness and headaches. Negative for dizziness, syncope, facial asymmetry, weakness and light-headedness.  Psychiatric/Behavioral: Negative.       Allergies  Review of patient's allergies indicates no known allergies.  Home Medications   Prior to Admission medications   Medication Sig Start Date End Date Taking? Authorizing Provider  aspirin EC 325 MG tablet Take 325 mg by mouth daily.   Yes Historical Provider, MD  diclofenac (VOLTAREN) 75 MG EC tablet Take 1 tablet (75 mg total) by mouth 2 (two) times daily. 04/12/14  Yes Harden Mo, MD  fexofenadine (ALLEGRA) 180 MG tablet Take 1 tablet (180 mg total) by mouth daily. 04/12/14  Yes Harden Mo, MD  fluticasone (FLONASE) 50 MCG/ACT nasal spray Place 2 sprays into both nostrils daily. 04/12/14  Yes Harden Mo, MD  hydrochlorothiazide (HYDRODIURIL) 12.5 MG tablet Take 1 tablet (12.5 mg total) by mouth daily. 06/25/14  Yes Ejiroghene E Emokpae, MD  lisinopril (PRINIVIL,ZESTRIL) 5 MG tablet Take 1 tablet (5 mg total) by mouth daily. 04/12/14  Yes Harden Mo, MD  lovastatin (MEVACOR) 40 MG tablet Take 1 tablet (40 mg total) by mouth at bedtime. 06/25/14  Yes Ejiroghene Arlyce Dice, MD  metoprolol (LOPRESSOR) 50 MG tablet Take 1 tablet (50 mg total) by mouth 2 (two) times daily. 06/25/14  Yes Ejiroghene E Emokpae, MD  NIACIN PO Take 1 tablet by mouth daily.    Yes Historical Provider, MD  terazosin (HYTRIN) 1 MG capsule Take 1 capsule (1 mg total) by mouth at bedtime. 06/25/14  Yes Ejiroghene Arlyce Dice, MD   aspirin 81 MG tablet Take 1 tablet (81 mg total) by mouth daily. 06/25/14   Ejiroghene E Emokpae, MD   BP 156/92  Pulse 74  Temp(Src) 98 F (36.7 C) (Oral)  Resp 13  SpO2 100% Physical Exam  Constitutional: He appears well-developed and well-nourished. No distress.  HENT:  Head: Normocephalic and atraumatic.  Mouth/Throat: Oropharynx is clear and moist. No oropharyngeal exudate.  Eyes: Right eye exhibits no discharge. Left eye exhibits no discharge. No scleral icterus.  Neck: Normal range of motion.  Cardiovascular: Normal rate, regular rhythm and normal heart sounds.   No murmur heard. Pulmonary/Chest: Effort normal and breath sounds normal. No respiratory distress.  Abdominal: Soft. There is no tenderness.  Musculoskeletal: Normal range of motion. He exhibits no edema and no tenderness.  Neurological: He has normal strength. No cranial nerve deficit or sensory deficit. He displays a negative Romberg sign. Coordination and gait normal. GCS eye subscore is 4. GCS verbal subscore is 5. GCS motor subscore is 6.  Patient fully alert answering questions appropriately in full, clear sentences. No slurred speech. Motor strength 5 out of 5 in all major muscle groups of upper and lower extremities. Cranial nerves II through XII grossly intact. Finger to nose normal. Patient has normal sensation to bilateral lower extremities to soft, sharp, dull touch.  Skin: Skin is warm and dry. No rash noted. He is not diaphoretic.  Psychiatric: He has a normal mood and affect.    ED Course  Procedures (including critical care time) Labs Review Labs Reviewed  CBC WITH DIFFERENTIAL - Abnormal; Notable for the following:    Neutrophils Relative % 41 (*)    All other components within normal limits  COMPREHENSIVE METABOLIC PANEL - Abnormal; Notable for the following:    GFR calc non Af Amer 62 (*)    GFR calc Af Amer 72 (*)    All other components within normal limits  CBG MONITORING, ED - Abnormal;  Notable for the following:    Glucose-Capillary 103 (*)    All other components within normal limits  URINALYSIS, ROUTINE W REFLEX MICROSCOPIC    Imaging Review Ct Head Wo Contrast  07/04/2014   CLINICAL DATA:  Onset of left-sided weakness and numbness beginning yesterday; difficulty walking ; no neurologic deficits currently  EXAM: CT HEAD WITHOUT CONTRAST  TECHNIQUE: Contiguous axial images were obtained from the base of the skull through the vertex without intravenous contrast.  COMPARISON:  Noncontrast CT scan of the brain dated July 04, 2006  FINDINGS: The ventricles are normal in size and position. There is an old lacunar infarction in the anterior limb of the left internal capsule. There is slightly decreased density superior and medial to this which may reflect subacute or acute ischemic change. There is no acute intracranial hemorrhage. The cerebellum and brainstem exhibit no acute abnormalities. There is an old anterior left cerebellar lacune.  The observed paranasal sinuses and mastoid air cells are clear. There is no skull fracture.  IMPRESSION: *There is an old lacunar infarction in the right cerebellar hemisphere and in the anterior limb  of the left internal capsule. Slightly decreased density just above the old lacunar infarction in the left internal capsule may reflect an area of acute ischemic change. *There is no acute intracranial hemorrhage nor intracranial mass effect. *Follow-up noncontrast CT scanning or MRI is recommended. * These results were called by telephone at the time of interpretation on 07/04/2014 at 12:32 pm to Dr. Vanita Panda, who verbally acknowledged these results.   Electronically Signed   By: David  Martinique   On: 07/04/2014 12:35   Mr Brain Wo Contrast  07/04/2014   CLINICAL DATA:  Onset of left-sided weakness and numbness beginning yesterday. Difficulty walking.  EXAM: MRI HEAD WITHOUT CONTRAST  TECHNIQUE: Multiplanar, multiecho pulse sequences of the brain and  surrounding structures were obtained without intravenous contrast.  COMPARISON:  Head CT 07/04/2014  FINDINGS: Images are mildly degraded by motion artifact.  There is no evidence of acute infarct, mass, midline shift, or extra-axial fluid collection. There is a remote infarct in the left basal ganglia with associated chronic blood products. Focus of remote microhemorrhage is noted in the left thalamus/ posterior limb of the left internal capsule. Remote, small infarcts are noted in the right cerebellum. Foci of T2 hyperintensity throughout the subcortical and deep cerebral white matter bilaterally are nonspecific but compatible with mild chronic small vessel ischemic disease, mildly advanced for age. There is mild cerebral and cerebellar atrophy.  Orbits are unremarkable. Paranasal sinuses and mastoid air cells are clear. Major intracranial vascular flow voids are preserved.  IMPRESSION: 1. No acute intracranial abnormality. 2. 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 3. Mild chronic small vessel ischemic disease and cerebral atrophy.   Electronically Signed   By: Logan Bores   On: 07/04/2014 14:32     EKG Interpretation None      MDM   Final diagnoses:  Numbness and tingling  Generalized ischemic cerebrovascular disease    Left-sided numbness starting yesterday in patient's mouth with difficulty tasting, and numbness radiating to patient's left leg, sparing his left arm. Patient with no obvious neuro deficit on exam today, alert, oriented, answering questions in full clear sentences, in no acute distress. Will workup with rule out of CVA, TIA. Patient does not meet any criteria for code stroke.   Patient's labs unremarkable. No leukocytosis, no anemia--no concern for B12 deficiency. Renal function within normal limits.   CT head without contrast impression: IMPRESSION: *There is an old lacunar infarction in the right cerebellar hemisphere and in the anterior limb of  the left internal capsule. Slightly decreased density just above the old lacunar infarction in the left internal capsule may reflect an area of acute ischemic change. *There is no acute intracranial hemorrhage nor intracranial mass effect. *Follow-up noncontrast CT scanning or MRI is recommended.   MR brain without contrast impression:  1. No acute intracranial abnormality. 2. 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 3. Mild chronic small vessel ischemic disease and cerebral atrophy.  Patient remaining fully alert, with normal neuro exam. Patient asymptomatic during entire ED stay. Based on results of CT and MRI, benign neuro exam and asymptomatic stay in the ED, we will have patient followup with neurology outpatient. I gave patient strict return precautions and spent time educating on signs and symptoms of CVA/TIA. I recommended patient continue 325 mg aspirin for stroke prophylaxis. I strongly encouraged patient to follow up with neurology and return to the ER if his symptoms return, worsen, change or should he have any  of the discussed aforementioned symptoms included in return precautions.  BP 156/92  Pulse 74  Temp(Src) 98 F (36.7 C) (Oral)  Resp 13  SpO2 100%  Signed,  Dahlia Bailiff, PA-C 5:21 PM  This patient seen and discussed with Dr. Carmin Muskrat, M.D.    Carrie Mew, PA-C 07/04/14 1721

## 2014-07-04 NOTE — ED Notes (Signed)
Pt c/o left sided weakness and numbness starting yesterday; pt sts some difficulty walking and numbness to left side of tongue; pt with no obvious neuro deficits at present

## 2014-07-04 NOTE — ED Notes (Signed)
Pt. Reports that his lt. Arm numbness is gone. He reports having numbness to his lt.cheek area very mild

## 2014-07-05 NOTE — ED Provider Notes (Signed)
  This was a shared visit with a mid-level provided (NP or PA).  Throughout the patient's course I was available for consultation/collaboration.  I saw the ECG (if appropriate), relevant labs and studies - I agree with the interpretation.  On my exam the patient was in no distress.  He had no ongoing substantial neurologic complaints, and his neurologic exam was unremarkable.  Given his history of prior stroke, CT, MRI were performed. The MRI was reassuring. Given the absence of objective neurologic deficits, the patient was discharged in stable condition to follow up with primary care, with a mitral prophylaxis via full-strength aspirin.        Carmin Muskrat, MD 07/05/14 Kathyrn Drown

## 2014-07-12 ENCOUNTER — Encounter: Payer: Self-pay | Admitting: Internal Medicine

## 2014-07-12 ENCOUNTER — Ambulatory Visit (INDEPENDENT_AMBULATORY_CARE_PROVIDER_SITE_OTHER): Payer: Self-pay | Admitting: Internal Medicine

## 2014-07-12 VITALS — BP 140/90 | HR 99 | Temp 98.4°F | Ht 70.5 in | Wt 211.7 lb

## 2014-07-12 DIAGNOSIS — Z8673 Personal history of transient ischemic attack (TIA), and cerebral infarction without residual deficits: Secondary | ICD-10-CM | POA: Insufficient documentation

## 2014-07-12 DIAGNOSIS — I25708 Atherosclerosis of coronary artery bypass graft(s), unspecified, with other forms of angina pectoris: Secondary | ICD-10-CM

## 2014-07-12 DIAGNOSIS — R7309 Other abnormal glucose: Secondary | ICD-10-CM

## 2014-07-12 DIAGNOSIS — I10 Essential (primary) hypertension: Secondary | ICD-10-CM

## 2014-07-12 DIAGNOSIS — I639 Cerebral infarction, unspecified: Secondary | ICD-10-CM

## 2014-07-12 DIAGNOSIS — R7303 Prediabetes: Secondary | ICD-10-CM | POA: Insufficient documentation

## 2014-07-12 DIAGNOSIS — I25810 Atherosclerosis of coronary artery bypass graft(s) without angina pectoris: Secondary | ICD-10-CM

## 2014-07-12 HISTORY — DX: Cerebral infarction, unspecified: I63.9

## 2014-07-12 LAB — POCT GLYCOSYLATED HEMOGLOBIN (HGB A1C): HEMOGLOBIN A1C: 6.4

## 2014-07-12 LAB — GLUCOSE, CAPILLARY: Glucose-Capillary: 114 mg/dL — ABNORMAL HIGH (ref 70–99)

## 2014-07-12 MED ORDER — ASPIRIN 81 MG PO TABS: ORAL_TABLET | ORAL | Status: DC

## 2014-07-12 MED ORDER — LISINOPRIL 20 MG PO TABS: 20.0000 mg | ORAL_TABLET | Freq: Every day | ORAL | Status: DC

## 2014-07-12 NOTE — Assessment & Plan Note (Signed)
Seen in the ED on 07/04/2014 for stroke symptoms. It seems like it was a TIA but brain MRI revealed remote infarct in the right cerebellum and left basal ganglion. No neurologic deficits and his neurologic exam is normal today. LDL is 61(on statin therapy). A1c 6.4%. TSH 1.582. Plan -Patient has been doubling his 81 mg aspirin, which I encouraged him to continue. -I discussed with him that Clopidogrel would be a better antiplatelet for him but is unable to afford it at this time. It is not on the [employer] $4 list. -referral to neurologist. Has orange card -Due to his risk factors for stroke, will tighten his treatment for hypertension. I will increase lisinopril  from 5 mg daily to 20 mg daily. -For his pre-diabetes, however, encouraged him to continue with lifestyle modifications with followup.

## 2014-07-12 NOTE — Patient Instructions (Signed)
General Instructions: Please increase Lisinopril from 5 mg to 20 mg daily  Please come back in 2-4 weeks to see your regular doctor Please take aspirin as before  We will refer you to neurology   Please bring your medicines with you each time you come to clinic.  Medicines may include prescription medications, over-the-counter medications, herbal remedies, eye drops, vitamins, or other pills. Please bring your medicines with you each time you come.   Medicines may be  Eye drops  Herbal   Vitamins  Pills  Seeing these help [place] take care of you.  Progress Toward Treatment Goals:  Treatment Goal 07/12/2014  Blood pressure unchanged    Self Care Goals & Plans:  Self Care Goal 07/12/2014  Manage my medications take my medicines as prescribed; bring my medications to every visit; refill my medications on time; follow the sick day instructions if I am sick    No flowsheet data found.   Care Management & Community Referrals:  Referral 07/12/2014  Referrals made for care management support none needed

## 2014-07-12 NOTE — Assessment & Plan Note (Signed)
Currently asymptomatic. Has not seen a cardiologist for a while. He will need to be referred once insurance issues have been sorted out.

## 2014-07-12 NOTE — Progress Notes (Signed)
Patient ID: Jason Velasquez, male   DOB: 1956/08/24, 58 y.o.   MRN: [id number]   Subjective:   HPI: [name] is a 58 y.o. gentleman with PMH of HTN, CAD- s/p CADG in 2007, and hypercholesterolemia and remote CVA seen on brain MRI recently.     Reason(s) for this visit: ED followup for stroke: He was evaluated in the ED on 07/04/2014 for left sided numbness and tingling associated with weakness, which resolved shortly. Head CT scan did not reveal acute intracranial process, but brain MRI revealed remote infarct in the right cerebellum and left basal ganglia. There was also noted mild chronic small vessel ischemic disease and cerebral atrophy. Patient currently feels well and without neurological symptoms. Symptoms resolved in the emergency department. He was encouraged to see neurology as outpatient. In the emergency department, he was discharged on aspirin 325 mg daily, but instead he has been taking 2 pills of aspirin 81 mg due to cost   Patient has significant problems with affording his medications. He recently got the orange card and is collection coupons to assist pay for his medications.   Please see the A&P for the status of the pt's chronic medical problems.  ROS: Constitutional: Denies fever, chills, diaphoresis, appetite change and fatigue.  Respiratory: Denies SOB, DOE, cough, chest tightness, and wheezing. Denies chest pain. CVS: No chest pain, palpitations and leg swelling.  GI: No abdominal pain, nausea, vomiting, bloody stools GU: No dysuria, frequency, hematuria, or flank pain.  MSK: No myalgias, back pain, joint swelling, arthralgias  Psych: No depression symptoms. No SI or SA.    Objective:  Physical Exam: Filed Vitals:   07/12/14 1616  BP: 140/90  Pulse: 99  Temp: 98.4 F (36.9 C)  TempSrc: Oral  Height: 5' 10.5" (1.791 m)  Weight: 211 lb 11.2 oz (96.026 kg)  SpO2: 100%   General: Well nourished. No acute distress.  HEENT: Normal oral mucosa. MMM.  Lungs:  CTA bilaterally. Heart: RRR; no extra sounds or murmurs  Abdomen: Non-distended, normal bowel sounds, soft, nontender; no hepatosplenomegaly  Extremities: No pedal edema. No joint swelling or tenderness. Neurologic: Normal EOM,  pupils are equal and round, and reactive to light. Alert and oriented x3. No focal neurological symptoms. Sensation is intact. Assessment & Plan:  Discussed case with my attending in the clinic, Dr. Lynnae January. See problem based charting.

## 2014-07-12 NOTE — Assessment & Plan Note (Addendum)
BP Readings from Last 3 Encounters:  07/12/14 140/90  07/04/14 156/92  06/25/14 138/87    Lab Results  Component Value Date   NA 139 07/04/2014   K 4.2 07/04/2014   CREATININE 1.24 07/04/2014    Assessment: Blood pressure control: mildly elevated Progress toward BP goal:  unchanged Comments: compliant   Plan: Medications:  Increase lisinopril from 5 mg daily to 20 mg daily. Continue with terazosin 1 mg at bedtime, metoprolol 50 mg twice daily, and hydrochlorothiazide 12.5 mg daily. Educational resources provided:   Self management tools provided:   Other plans: If blood pressure is not well controlled on next visit, we can increase hydrochlorothiazide to 50 mg daily.  Follow up in 2-3 weeks with PCP

## 2014-07-12 NOTE — Assessment & Plan Note (Signed)
Cont with lifestyle modification. Will be followed up closely.

## 2014-07-15 NOTE — Progress Notes (Signed)
Internal Medicine Clinic Attending  Case discussed with Dr. Kazibwe soon after the resident saw the patient.  We reviewed the resident's history and exam and pertinent patient test results.  I agree with the assessment, diagnosis, and plan of care documented in the resident's note. 

## 2014-08-12 ENCOUNTER — Encounter: Payer: Self-pay | Admitting: *Deleted

## 2014-09-11 NOTE — Addendum Note (Signed)
Addended by: Hulan Fray on: 09/11/2014 05:48 PM   Modules accepted: Orders

## 2014-11-27 ENCOUNTER — Telehealth: Payer: Self-pay | Admitting: Internal Medicine

## 2014-11-27 NOTE — Telephone Encounter (Signed)
Call to patient to confirm appointment for 11/28/14 at 2:15. Phone number is invalid

## 2014-11-28 ENCOUNTER — Ambulatory Visit (INDEPENDENT_AMBULATORY_CARE_PROVIDER_SITE_OTHER): Payer: Self-pay | Admitting: Internal Medicine

## 2014-11-28 ENCOUNTER — Encounter: Payer: Self-pay | Admitting: Internal Medicine

## 2014-11-28 VITALS — BP 113/94 | HR 96 | Temp 97.4°F | Wt 197.6 lb

## 2014-11-28 DIAGNOSIS — Z8673 Personal history of transient ischemic attack (TIA), and cerebral infarction without residual deficits: Secondary | ICD-10-CM

## 2014-11-28 DIAGNOSIS — H538 Other visual disturbances: Secondary | ICD-10-CM | POA: Insufficient documentation

## 2014-11-28 DIAGNOSIS — I10 Essential (primary) hypertension: Secondary | ICD-10-CM

## 2014-11-28 DIAGNOSIS — R7309 Other abnormal glucose: Secondary | ICD-10-CM

## 2014-11-28 DIAGNOSIS — I25810 Atherosclerosis of coronary artery bypass graft(s) without angina pectoris: Secondary | ICD-10-CM

## 2014-11-28 DIAGNOSIS — I25708 Atherosclerosis of coronary artery bypass graft(s), unspecified, with other forms of angina pectoris: Secondary | ICD-10-CM

## 2014-11-28 DIAGNOSIS — R7303 Prediabetes: Secondary | ICD-10-CM

## 2014-11-28 DIAGNOSIS — Z7982 Long term (current) use of aspirin: Secondary | ICD-10-CM

## 2014-11-28 MED ORDER — LOVASTATIN 20 MG PO TABS: 20.0000 mg | ORAL_TABLET | Freq: Every day | ORAL | Status: DC

## 2014-11-28 MED ORDER — LISINOPRIL-HYDROCHLOROTHIAZIDE 10-12.5 MG PO TABS: 1.0000 | ORAL_TABLET | Freq: Every day | ORAL | Status: DC

## 2014-11-28 NOTE — Progress Notes (Signed)
[facility] Subjective:   Patient ID: Jason Velasquez male   DOB: 1955-12-25 59 y.o.   MRN: [id number]  HPI: [name] is a 59 y.o. male with a PMH below who presents for follow up as he received a letter from our office that he was past due for an appointment..  At his last visit his lisinopril was increased, and was referred to neurology for some remote CVAs seen on MRI at ED visit in Oct 2015.  He reports that he has been out of all of his medications for a few months.  He has only been taking Asprin.  His only complaint today is some blurry vision,  He notes that for the last few weeks his vision he feels is a little worse,  Feels like he needs new glasses.  He denies any eye pain.  He does reports some depression, decreased energy, poor sleep.  Denies any suicidal ideation.   Past Medical History  Diagnosis Date  . Hypertension   . Hypercholesterolemia   . CAD (coronary artery disease) of artery bypass graft 06/25/2014    CABG- 2007   . History of CVA (cerebrovascular accident) 07/12/2014   Current Outpatient Prescriptions  Medication Sig Dispense Refill  . aspirin 81 MG tablet Take 2 pills daily 30 tablet 5  . lisinopril-hydrochlorothiazide (PRINZIDE) 10-12.5 MG per tablet Take 1 tablet by mouth daily. 30 tablet 5  . lovastatin (MEVACOR) 20 MG tablet Take 1 tablet (20 mg total) by mouth at bedtime. 30 tablet 11   No current facility-administered medications for this visit.   Family History  Problem Relation Age of Onset  . Hypertension Mother   . Diabetes Father   . Hypertension Sister    History   Social History  . Marital Status: Divorced    Spouse Name: N/A  . Number of Children: N/A  . Years of Education: N/A   Social History Main Topics  . Smoking status: Former Smoker    Quit date: 10/04/2009  . Smokeless tobacco: Not on file  . Alcohol Use: No  . Drug Use: Not on file  . Sexual Activity: Not on file   Other Topics Concern  .  None   Social History Narrative   Review of Systems: Review of Systems  Constitutional: Positive for malaise/fatigue. Negative for fever.  Eyes: Positive for blurred vision. Negative for double vision, photophobia, pain, discharge and redness.  Respiratory: Negative for cough and shortness of breath.   Cardiovascular: Negative for chest pain and leg swelling.  Gastrointestinal: Negative for heartburn and abdominal pain.  Genitourinary: Negative for dysuria.  Musculoskeletal: Negative for myalgias.  Neurological: Negative for dizziness and headaches.  Psychiatric/Behavioral: Positive for depression. The patient has insomnia.      Objective:  Physical Exam: Filed Vitals:   11/28/14 1428  BP: 113/94  Pulse: 96  Temp: 97.4 F (36.3 C)  TempSrc: Oral  Weight: 197 lb 9.6 oz (89.631 kg)  SpO2: 100%  Physical Exam  Constitutional: He is well-developed, well-nourished, and in no distress.  HENT:  Head: Normocephalic and atraumatic.  Eyes: Conjunctivae and EOM are normal. Pupils are equal, round, and reactive to light.  Cardiovascular: Normal rate, regular rhythm and normal heart sounds.   No murmur heard. Pulmonary/Chest: Effort normal and breath sounds normal.  Abdominal: Soft. Bowel sounds are normal.  Musculoskeletal: He exhibits no edema.  Psychiatric: Mood and affect normal.  Nursing note and vitals reviewed.   Assessment & Plan:  Case discussed  with Dr. Daryll Drown  HTN (hypertension) BP Readings from Last 3 Encounters:  11/28/14 113/94  07/12/14 140/90  07/04/14 156/92    Lab Results  Component Value Date   NA 139 07/04/2014   K 4.2 07/04/2014   CREATININE 1.24 07/04/2014    Assessment: Blood pressure control: mildly elevated Progress toward BP goal:  improved Comments: he is Rx multiple medications but only has mild htn currently off all medications  Plan: Medications:  D/C previous medications, Start Lisinopril-HCTZ 10-12.5mg  Educational resources provided:  brochure Self management tools provided:   Other plans: Check BMP in one month, follow up in 3 for cost.  I am simplifying his regimen as much as possible for his affordability.  He reports he can only afford about 12 dollars of medications a month now.  This combination pill should only cost $4.      CAD (coronary artery disease) of artery bypass graft -Given his extreme difficulty in affording his medications I do not feel that we need to add the extra cost right now of sending him to cardiology.  Will try to maximize the benefit with as much cost effective medications as possible. - Lisinopril HCTZ for BP - Lovastatin 20mg  for CAD ($4 list) - Continue 162mg  ASA. - If able to afford more medications can resume BB.   Prediabetes -Encouraged diet and exercise.   History of CVA (cerebrovascular accident) - May benefit from change to plavix but unable to afford this. - Continue ASA 162mg  daily - Lovastatin 20mg  daily - Do not see much additional value in neuro referral at this time as well be unlikely to afford additional medications.  Will focus on risk factor control in primary care setting.   Blurry vision, bilateral - place referral for optometry.     Medications Ordered Meds ordered this encounter  Medications  . lovastatin (MEVACOR) 20 MG tablet    Sig: Take 1 tablet (20 mg total) by mouth at bedtime.    Dispense:  30 tablet    Refill:  11    D/C 40mg  Lovastatin Rx  . lisinopril-hydrochlorothiazide (PRINZIDE) 10-12.5 MG per tablet    Sig: Take 1 tablet by mouth daily.    Dispense:  30 tablet    Refill:  5    Please D/C Rx for Lisinopril 20mg , HCTZ 12.5mg , Terazosin 1mg , Metoprolol 50mg    Other Orders Orders Placed This Encounter  Procedures  . BMP with Estimated GFR ([id number])    Standing Status: Future     Number of Occurrences:      Standing Expiration Date: 01/31/2015  . Ambulatory referral to Optometry    Referral Priority:  Routine    Referral Type:   Vision Transport planner)    Referral Reason:  Specialty Services Required    Requested Specialty:  Optometry    Number of Visits Requested:  1

## 2014-11-28 NOTE — Patient Instructions (Signed)
General Instructions: Please continue taking Aspirin daily and pick up and take the medications Lovastatin 20mg  daily and Lisinopril-HCTZ 10-12.5mg  daily.  We will call you about the eye doctor referral  Please bring your medicines with you each time you come to clinic.  Medicines may include prescription medications, over-the-counter medications, herbal remedies, eye drops, vitamins, or other pills.   Progress Toward Treatment Goals:  Treatment Goal 11/28/2014  Blood pressure improved    Self Care Goals & Plans:  Self Care Goal 11/28/2014  Manage my medications take my medicines as prescribed; bring my medications to every visit; refill my medications on time  Eat healthy foods eat foods that are low in salt; eat baked foods instead of fried foods  Be physically active find an activity I enjoy    No flowsheet data found.   Care Management & Community Referrals:  Referral 11/28/2014  Referrals made for care management support none needed

## 2014-11-29 NOTE — Assessment & Plan Note (Addendum)
-  Given his extreme difficulty in affording his medications I do not feel that we need to add the extra cost right now of sending him to cardiology.  Will try to maximize the benefit with as much cost effective medications as possible. - Lisinopril HCTZ for BP - Lovastatin 20mg  for CAD ($4 list) - Continue 162mg  ASA. - If able to afford more medications can resume BB.

## 2014-11-29 NOTE — Assessment & Plan Note (Signed)
BP Readings from Last 3 Encounters:  11/28/14 113/94  07/12/14 140/90  07/04/14 156/92    Lab Results  Component Value Date   NA 139 07/04/2014   K 4.2 07/04/2014   CREATININE 1.24 07/04/2014    Assessment: Blood pressure control: mildly elevated Progress toward BP goal:  improved Comments: he is Rx multiple medications but only has mild htn currently off all medications  Plan: Medications:  D/C previous medications, Start Lisinopril-HCTZ 10-12.5mg  Educational resources provided: brochure Self management tools provided:   Other plans: Check BMP in one month, follow up in 3 for cost.  I am simplifying his regimen as much as possible for his affordability.  He reports he can only afford about 12 dollars of medications a month now.  This combination pill should only cost $4.

## 2014-11-29 NOTE — Assessment & Plan Note (Signed)
-   May benefit from change to plavix but unable to afford this. - Continue ASA 162mg  daily - Lovastatin 20mg  daily - Do not see much additional value in neuro referral at this time as well be unlikely to afford additional medications.  Will focus on risk factor control in primary care setting.

## 2014-11-29 NOTE — Assessment & Plan Note (Signed)
-   place referral for optometry.

## 2014-11-29 NOTE — Assessment & Plan Note (Signed)
Encouraged diet and exercise.  

## 2014-12-03 NOTE — Progress Notes (Signed)
Internal Medicine Clinic Attending  Case discussed with Dr. Hoffman soon after the resident saw the patient.  We reviewed the resident's history and exam and pertinent patient test results.  I agree with the assessment, diagnosis, and plan of care documented in the resident's note. 

## 2014-12-06 ENCOUNTER — Encounter (HOSPITAL_COMMUNITY): Payer: Self-pay | Admitting: *Deleted

## 2014-12-06 DIAGNOSIS — I129 Hypertensive chronic kidney disease with stage 1 through stage 4 chronic kidney disease, or unspecified chronic kidney disease: Secondary | ICD-10-CM | POA: Diagnosis present

## 2014-12-06 DIAGNOSIS — Z8249 Family history of ischemic heart disease and other diseases of the circulatory system: Secondary | ICD-10-CM

## 2014-12-06 DIAGNOSIS — N179 Acute kidney failure, unspecified: Secondary | ICD-10-CM | POA: Diagnosis present

## 2014-12-06 DIAGNOSIS — N182 Chronic kidney disease, stage 2 (mild): Secondary | ICD-10-CM | POA: Diagnosis present

## 2014-12-06 DIAGNOSIS — E1310 Other specified diabetes mellitus with ketoacidosis without coma: Principal | ICD-10-CM | POA: Diagnosis present

## 2014-12-06 DIAGNOSIS — I25810 Atherosclerosis of coronary artery bypass graft(s) without angina pectoris: Secondary | ICD-10-CM | POA: Diagnosis present

## 2014-12-06 DIAGNOSIS — K760 Fatty (change of) liver, not elsewhere classified: Secondary | ICD-10-CM | POA: Diagnosis present

## 2014-12-06 DIAGNOSIS — Z87891 Personal history of nicotine dependence: Secondary | ICD-10-CM

## 2014-12-06 DIAGNOSIS — Z833 Family history of diabetes mellitus: Secondary | ICD-10-CM

## 2014-12-06 DIAGNOSIS — Z7982 Long term (current) use of aspirin: Secondary | ICD-10-CM

## 2014-12-06 DIAGNOSIS — E780 Pure hypercholesterolemia: Secondary | ICD-10-CM | POA: Diagnosis present

## 2014-12-06 DIAGNOSIS — Z9114 Patient's other noncompliance with medication regimen: Secondary | ICD-10-CM | POA: Diagnosis present

## 2014-12-06 DIAGNOSIS — Z8673 Personal history of transient ischemic attack (TIA), and cerebral infarction without residual deficits: Secondary | ICD-10-CM

## 2014-12-06 DIAGNOSIS — H538 Other visual disturbances: Secondary | ICD-10-CM | POA: Diagnosis present

## 2014-12-06 DIAGNOSIS — E860 Dehydration: Secondary | ICD-10-CM | POA: Diagnosis present

## 2014-12-06 DIAGNOSIS — Z79899 Other long term (current) drug therapy: Secondary | ICD-10-CM

## 2014-12-06 DIAGNOSIS — R748 Abnormal levels of other serum enzymes: Secondary | ICD-10-CM | POA: Diagnosis present

## 2014-12-06 DIAGNOSIS — N200 Calculus of kidney: Secondary | ICD-10-CM | POA: Diagnosis present

## 2014-12-06 LAB — CBC WITH DIFFERENTIAL/PLATELET
Basophils Absolute: 0 10*3/uL (ref 0.0–0.1)
Basophils Relative: 0 % (ref 0–1)
Eosinophils Absolute: 0 10*3/uL (ref 0.0–0.7)
Eosinophils Relative: 0 % (ref 0–5)
HEMATOCRIT: 49.1 % (ref 39.0–52.0)
Hemoglobin: 16.3 g/dL (ref 13.0–17.0)
LYMPHS PCT: 22 % (ref 12–46)
Lymphs Abs: 1.7 10*3/uL (ref 0.7–4.0)
MCH: 26.8 pg (ref 26.0–34.0)
MCHC: 33.2 g/dL (ref 30.0–36.0)
MCV: 80.8 fL (ref 78.0–100.0)
Monocytes Absolute: 0.5 10*3/uL (ref 0.1–1.0)
Monocytes Relative: 7 % (ref 3–12)
NEUTROS ABS: 5.7 10*3/uL (ref 1.7–7.7)
NEUTROS PCT: 71 % (ref 43–77)
PLATELETS: 303 10*3/uL (ref 150–400)
RBC: 6.08 MIL/uL — AB (ref 4.22–5.81)
RDW: 15 % (ref 11.5–15.5)
WBC: 8 10*3/uL (ref 4.0–10.5)

## 2014-12-06 LAB — COMPREHENSIVE METABOLIC PANEL
ALT: 29 U/L (ref 0–53)
AST: 20 U/L (ref 0–37)
Albumin: 4.1 g/dL (ref 3.5–5.2)
Alkaline Phosphatase: 103 U/L (ref 39–117)
Anion gap: 21 — ABNORMAL HIGH (ref 5–15)
BUN: 41 mg/dL — ABNORMAL HIGH (ref 6–23)
CHLORIDE: 95 mmol/L — AB (ref 96–112)
CO2: 20 mmol/L (ref 19–32)
Calcium: 9.9 mg/dL (ref 8.4–10.5)
Creatinine, Ser: 2.32 mg/dL — ABNORMAL HIGH (ref 0.50–1.35)
GFR calc Af Amer: 34 mL/min — ABNORMAL LOW (ref >90)
GFR calc non Af Amer: 29 mL/min — ABNORMAL LOW (ref >90)
GLUCOSE: 968 mg/dL — AB (ref 70–99)
Potassium: 4.9 mmol/L (ref 3.5–5.1)
Sodium: 136 mmol/L (ref 135–145)
Total Bilirubin: 1.3 mg/dL — ABNORMAL HIGH (ref 0.3–1.2)
Total Protein: 7.7 g/dL (ref 6.0–8.3)

## 2014-12-06 NOTE — ED Notes (Signed)
No pain anywhere 

## 2014-12-06 NOTE — ED Notes (Signed)
The pt is c/o being  Weak since he left the ed last Monday for the same symptoms.  The wife reports that the pt has been falling around at home. The pt is alert he reports that  He is more tired than usual and is more thirsty .

## 2014-12-07 ENCOUNTER — Observation Stay (HOSPITAL_COMMUNITY): Payer: Self-pay

## 2014-12-07 ENCOUNTER — Encounter (HOSPITAL_COMMUNITY): Payer: Self-pay | Admitting: Internal Medicine

## 2014-12-07 ENCOUNTER — Inpatient Hospital Stay (HOSPITAL_COMMUNITY)
Admission: EM | Admit: 2014-12-07 | Discharge: 2014-12-09 | DRG: 638 | Disposition: A | Discharge status: Home / Self Care | Payer: Self-pay | Attending: Internal Medicine | Admitting: Internal Medicine

## 2014-12-07 ENCOUNTER — Other Ambulatory Visit (HOSPITAL_COMMUNITY): Payer: Self-pay

## 2014-12-07 DIAGNOSIS — E119 Type 2 diabetes mellitus without complications: Secondary | ICD-10-CM

## 2014-12-07 DIAGNOSIS — Z8673 Personal history of transient ischemic attack (TIA), and cerebral infarction without residual deficits: Secondary | ICD-10-CM

## 2014-12-07 DIAGNOSIS — E1110 Type 2 diabetes mellitus with ketoacidosis without coma: Secondary | ICD-10-CM | POA: Diagnosis present

## 2014-12-07 DIAGNOSIS — I10 Essential (primary) hypertension: Secondary | ICD-10-CM | POA: Diagnosis present

## 2014-12-07 DIAGNOSIS — R739 Hyperglycemia, unspecified: Secondary | ICD-10-CM

## 2014-12-07 DIAGNOSIS — K760 Fatty (change of) liver, not elsewhere classified: Secondary | ICD-10-CM | POA: Diagnosis present

## 2014-12-07 DIAGNOSIS — R112 Nausea with vomiting, unspecified: Secondary | ICD-10-CM

## 2014-12-07 DIAGNOSIS — R748 Abnormal levels of other serum enzymes: Secondary | ICD-10-CM

## 2014-12-07 DIAGNOSIS — E11319 Type 2 diabetes mellitus with unspecified diabetic retinopathy without macular edema: Secondary | ICD-10-CM

## 2014-12-07 DIAGNOSIS — I120 Hypertensive chronic kidney disease with stage 5 chronic kidney disease or end stage renal disease: Secondary | ICD-10-CM

## 2014-12-07 DIAGNOSIS — R0600 Dyspnea, unspecified: Secondary | ICD-10-CM

## 2014-12-07 DIAGNOSIS — N200 Calculus of kidney: Secondary | ICD-10-CM

## 2014-12-07 DIAGNOSIS — E1151 Type 2 diabetes mellitus with diabetic peripheral angiopathy without gangrene: Secondary | ICD-10-CM

## 2014-12-07 DIAGNOSIS — N182 Chronic kidney disease, stage 2 (mild): Secondary | ICD-10-CM | POA: Diagnosis present

## 2014-12-07 HISTORY — DX: Type 2 diabetes mellitus with ketoacidosis without coma: E11.10

## 2014-12-07 HISTORY — DX: Calculus of kidney: N20.0

## 2014-12-07 HISTORY — DX: Type 2 diabetes mellitus without complications: E11.9

## 2014-12-07 LAB — MRSA PCR SCREENING: MRSA by PCR: NEGATIVE

## 2014-12-07 LAB — URINALYSIS, ROUTINE W REFLEX MICROSCOPIC
Bilirubin Urine: NEGATIVE
Glucose, UA: >1000 mg/dL — AB
Hgb urine dipstick: NEGATIVE
KETONES UR: 15 mg/dL — AB
LEUKOCYTES UA: NEGATIVE
NITRITE: NEGATIVE
Protein, ur: NEGATIVE mg/dL
Specific Gravity, Urine: 1.035 — ABNORMAL HIGH (ref 1.005–1.030)
UROBILINOGEN UA: 0.2 mg/dL (ref 0.0–1.0)
pH: 5 (ref 5.0–8.0)

## 2014-12-07 LAB — BASIC METABOLIC PANEL
Anion gap: 14 (ref 5–15)
Anion gap: 10 (ref 5–15)
Anion gap: 10 (ref 5–15)
Anion gap: 9 (ref 5–15)
BUN: 42 mg/dL — ABNORMAL HIGH (ref 6–23)
BUN: 39 mg/dL — AB (ref 6–23)
BUN: 40 mg/dL — ABNORMAL HIGH (ref 6–23)
BUN: 43 mg/dL — ABNORMAL HIGH (ref 6–23)
CALCIUM: 9.8 mg/dL (ref 8.4–10.5)
CHLORIDE: 104 mmol/L (ref 96–112)
CHLORIDE: 112 mmol/L (ref 96–112)
CHLORIDE: 117 mmol/L — AB (ref 96–112)
CO2: 22 mmol/L (ref 19–32)
CO2: 27 mmol/L (ref 19–32)
CO2: 25 mmol/L (ref 19–32)
CO2: 26 mmol/L (ref 19–32)
CREATININE: 1.77 mg/dL — AB (ref 0.50–1.35)
Calcium: 9.1 mg/dL (ref 8.4–10.5)
Calcium: 9.3 mg/dL (ref 8.4–10.5)
Calcium: 9.5 mg/dL (ref 8.4–10.5)
Chloride: 116 mmol/L — ABNORMAL HIGH (ref 96–112)
Creatinine, Ser: 2 mg/dL — ABNORMAL HIGH (ref 0.50–1.35)
Creatinine, Ser: 1.73 mg/dL — ABNORMAL HIGH (ref 0.50–1.35)
Creatinine, Ser: 1.37 mg/dL — ABNORMAL HIGH (ref 0.50–1.35)
GFR calc Af Amer: 40 mL/min — ABNORMAL LOW (ref >90)
GFR calc Af Amer: 64 mL/min — ABNORMAL LOW (ref >90)
GFR calc Af Amer: 47 mL/min — ABNORMAL LOW (ref >90)
GFR calc non Af Amer: 35 mL/min — ABNORMAL LOW (ref >90)
GFR calc non Af Amer: 41 mL/min — ABNORMAL LOW (ref >90)
GFR calc non Af Amer: 40 mL/min — ABNORMAL LOW (ref >90)
GFR, EST AFRICAN AMERICAN: 48 mL/min — AB (ref >90)
GFR, EST NON AFRICAN AMERICAN: 55 mL/min — AB (ref >90)
GLUCOSE: 799 mg/dL — AB (ref 70–99)
GLUCOSE: 317 mg/dL — AB (ref 70–99)
GLUCOSE: 105 mg/dL — AB (ref 70–99)
Glucose, Bld: 151 mg/dL — ABNORMAL HIGH (ref 70–99)
POTASSIUM: 4.6 mmol/L (ref 3.5–5.1)
POTASSIUM: 4.2 mmol/L (ref 3.5–5.1)
Potassium: 3.8 mmol/L (ref 3.5–5.1)
Potassium: 3.8 mmol/L (ref 3.5–5.1)
SODIUM: 140 mmol/L (ref 135–145)
SODIUM: 149 mmol/L — AB (ref 135–145)
SODIUM: 151 mmol/L — AB (ref 135–145)
Sodium: 152 mmol/L — ABNORMAL HIGH (ref 135–145)

## 2014-12-07 LAB — GLUCOSE, CAPILLARY
GLUCOSE-CAPILLARY: 224 mg/dL — AB (ref 70–99)
GLUCOSE-CAPILLARY: 90 mg/dL (ref 70–99)
GLUCOSE-CAPILLARY: 387 mg/dL — AB (ref 70–99)
Glucose-Capillary: 213 mg/dL — ABNORMAL HIGH (ref 70–99)
Glucose-Capillary: 345 mg/dL — ABNORMAL HIGH (ref 70–99)
Glucose-Capillary: 356 mg/dL — ABNORMAL HIGH (ref 70–99)

## 2014-12-07 LAB — TROPONIN I
TROPONIN I: 0.06 ng/mL — AB (ref <0.031)
Troponin I: 0.04 ng/mL — ABNORMAL HIGH (ref <0.031)
Troponin I: 0.05 ng/mL — ABNORMAL HIGH (ref <0.031)
Troponin I: 0.04 ng/mL — ABNORMAL HIGH (ref <0.031)

## 2014-12-07 LAB — CBG MONITORING, ED
GLUCOSE-CAPILLARY: 599 mg/dL — AB (ref 70–99)
GLUCOSE-CAPILLARY: 346 mg/dL — AB (ref 70–99)
GLUCOSE-CAPILLARY: 110 mg/dL — AB (ref 70–99)
Glucose-Capillary: >600 mg/dL (ref 70–99)
Glucose-Capillary: 260 mg/dL — ABNORMAL HIGH (ref 70–99)
Glucose-Capillary: 271 mg/dL — ABNORMAL HIGH (ref 70–99)
Glucose-Capillary: 156 mg/dL — ABNORMAL HIGH (ref 70–99)
Glucose-Capillary: 150 mg/dL — ABNORMAL HIGH (ref 70–99)

## 2014-12-07 LAB — URINE MICROSCOPIC-ADD ON

## 2014-12-07 LAB — LACTIC ACID, PLASMA
LACTIC ACID, VENOUS: 2.2 mmol/L — AB (ref 0.5–2.0)
LACTIC ACID, VENOUS: 3.1 mmol/L — AB (ref 0.5–2.0)

## 2014-12-07 LAB — PHOSPHORUS: PHOSPHORUS: 4.2 mg/dL (ref 2.3–4.6)

## 2014-12-07 LAB — BILIRUBIN, FRACTIONATED(TOT/DIR/INDIR)
BILIRUBIN DIRECT: 0.2 mg/dL (ref 0.0–0.5)
Indirect Bilirubin: 1.2 mg/dL — ABNORMAL HIGH (ref 0.3–0.9)
Total Bilirubin: 1.4 mg/dL — ABNORMAL HIGH (ref 0.3–1.2)

## 2014-12-07 LAB — MAGNESIUM: Magnesium: 3.4 mg/dL — ABNORMAL HIGH (ref 1.5–2.5)

## 2014-12-07 LAB — OSMOLALITY: Osmolality: 356 mOsm/kg — ABNORMAL HIGH (ref 275–300)

## 2014-12-07 LAB — SODIUM, URINE, RANDOM: Sodium, Ur: 14 mmol/L

## 2014-12-07 LAB — RAPID URINE DRUG SCREEN, HOSP PERFORMED
Amphetamines: NOT DETECTED
BENZODIAZEPINES: NOT DETECTED
Barbiturates: NOT DETECTED
Cocaine: NOT DETECTED
Opiates: NOT DETECTED
TETRAHYDROCANNABINOL: NOT DETECTED

## 2014-12-07 LAB — UREA NITROGEN, URINE: Urea Nitrogen, Ur: 1576 mg/dL

## 2014-12-07 LAB — BETA-HYDROXYBUTYRIC ACID: BETA-HYDROXYBUTYRIC ACID: 4.01 mmol/L — AB (ref 0.05–0.27)

## 2014-12-07 LAB — LIPASE, BLOOD: Lipase: 82 U/L — ABNORMAL HIGH (ref 11–59)

## 2014-12-07 MED ORDER — SODIUM CHLORIDE 0.9 % IV SOLN
1000.0000 mL | Freq: Once | INTRAVENOUS | Status: AC
  Administered 2014-12-07: 1000 mL via INTRAVENOUS

## 2014-12-07 MED ORDER — INSULIN NPH (HUMAN) (ISOPHANE) 100 UNIT/ML ~~LOC~~ SUSP
10.0000 [IU] | Freq: Two times a day (BID) | SUBCUTANEOUS | Status: DC
  Administered 2014-12-07: 10 [IU] via SUBCUTANEOUS

## 2014-12-07 MED ORDER — INSULIN NPH (HUMAN) (ISOPHANE) 100 UNIT/ML ~~LOC~~ SUSP
10.0000 [IU] | Freq: Once | SUBCUTANEOUS | Status: AC
  Administered 2014-12-07: 10 [IU] via SUBCUTANEOUS
  Filled 2014-12-07: qty 10

## 2014-12-07 MED ORDER — HEPARIN SODIUM (PORCINE) 5000 UNIT/ML IJ SOLN
5000.0000 [IU] | Freq: Three times a day (TID) | INTRAMUSCULAR | Status: DC
  Administered 2014-12-07 – 2014-12-09 (×6): 5000 [IU] via SUBCUTANEOUS
  Filled 2014-12-07 (×9): qty 1

## 2014-12-07 MED ORDER — SODIUM CHLORIDE 0.9 % IV SOLN
INTRAVENOUS | Status: DC
  Administered 2014-12-07: 5.4 [IU]/h via INTRAVENOUS
  Filled 2014-12-07: qty 2.5

## 2014-12-07 MED ORDER — DEXTROSE-NACL 5-0.45 % IV SOLN
INTRAVENOUS | Status: DC
  Administered 2014-12-07: 11:00:00 via INTRAVENOUS

## 2014-12-07 MED ORDER — SODIUM CHLORIDE 0.9 % IV SOLN
1000.0000 mL | INTRAVENOUS | Status: DC
  Administered 2014-12-07: 1000 mL via INTRAVENOUS

## 2014-12-07 MED ORDER — SODIUM CHLORIDE 0.45 % IV SOLN
INTRAVENOUS | Status: DC
  Administered 2014-12-07: 21:00:00 via INTRAVENOUS

## 2014-12-07 MED ORDER — HEPARIN SODIUM (PORCINE) 5000 UNIT/ML IJ SOLN: 5000.0000 [IU] | Freq: Three times a day (TID) | INTRAMUSCULAR | Status: DC

## 2014-12-07 MED ORDER — ONDANSETRON HCL 4 MG PO TABS: 4.0000 mg | ORAL_TABLET | Freq: Four times a day (QID) | ORAL | Status: DC | PRN

## 2014-12-07 MED ORDER — SODIUM CHLORIDE 0.9 % IV SOLN
INTRAVENOUS | Status: DC
  Administered 2014-12-07: 19:00:00 via INTRAVENOUS

## 2014-12-07 MED ORDER — INSULIN ASPART 100 UNIT/ML ~~LOC~~ SOLN
0.0000 [IU] | Freq: Three times a day (TID) | SUBCUTANEOUS | Status: DC
  Administered 2014-12-08: 9 [IU] via SUBCUTANEOUS

## 2014-12-07 MED ORDER — ONDANSETRON HCL 4 MG/2ML IJ SOLN: 4.0000 mg | Freq: Four times a day (QID) | INTRAMUSCULAR | Status: DC | PRN

## 2014-12-07 NOTE — ED Notes (Signed)
Spoke with Admitting MD Internal Medicine. She is going to review the chart.  Serum Osmality 356, no orders received

## 2014-12-07 NOTE — ED Notes (Signed)
CBG 110. 

## 2014-12-07 NOTE — ED Notes (Signed)
Reported to Dr. Arlyce Dice that pt.s CBG 156 and we started th D5%-45% Sodium Chloride. Informed Dr. Denton Brick that pt. Is on an Insulin drip with a Glucomander.  No orders receive tp stop the Glucomander/insulin drip.

## 2014-12-07 NOTE — Progress Notes (Signed)
PT Cancellation Note  Patient Details Name: Jason Velasquez MRN: [id number] DOB: 09-Dec-1955   Cancelled Treatment:    Reason Eval/Treat Not Completed: Patient not medically ready;Medical issues which prohibited therapy. Troponin level trending up at this time. Will hold on PT until medically ready.    Elie Confer [place], [place] 12/07/2014, 12:28 PM

## 2014-12-07 NOTE — Progress Notes (Signed)
Subjective:  Patient was seen and examined this morning. Patient states he has been feeling unwell and weak for the past 2-3 weeks. He admits to polyuria, polydipsia, blurry vision and a 20 pound weight loss in the last several weeks. He admits to nausea and vomiting which have now greatly improved. He also has increased shortness of breath above baseline, but none currently. He denies any fever, chills, chest pain, abdominal pain, back pain, or dysuria.   Objective: Vital signs in last 24 hours: Filed Vitals:   12/07/14 0448 12/07/14 0500 12/07/14 0515 12/07/14 0630  BP: 115/75 125/85 122/93 135/96  Pulse: 115 115 116 115  Temp:      TempSrc:      Resp: 18 16 16 14   Height:      SpO2: 97% 100% 99% 98%   General: Vital signs reviewed.  Patient is well-developed and well-nourished, in no acute distress and cooperative with exam.  HEENT: No scleral icterus, dry oral mucosa, no ketone breath Cardiovascular: RRR Pulmonary/Chest: Clear to auscultation bilaterally, no wheezes, rales, or rhonchi. Abdominal: Soft, non-tender, non-distended, BS + Extremities: No lower extremity edema bilaterally Neurological: A&O x3 Psychiatric: Normal mood and affect. Speech and behavior is normal. Cognition and memory are normal.   Lab Results: Basic Metabolic Panel:  Recent Labs Lab 12/07/14 0238 12/07/14 0721  NA 140 149*  K 4.6 4.2  CL 104 112  CO2 22 27  GLUCOSE 799* 317*  BUN 42* 39*  CREATININE 2.00* 1.73*  CALCIUM 9.1 9.8  MG 3.4*  --   PHOS 4.2  --    Liver Function Tests:  Recent Labs Lab 12/06/14 2301 12/07/14 0238  AST 20  --   ALT 29  --   ALKPHOS 103  --   BILITOT 1.3* 1.4*  PROT 7.7  --   ALBUMIN 4.1  --     Recent Labs Lab 12/07/14 0238  LIPASE 82*   CBC:  Recent Labs Lab 12/06/14 2301  WBC 8.0  NEUTROABS 5.7  HGB 16.3  HCT 49.1  MCV 80.8  PLT 303   Cardiac Enzymes:  Recent Labs Lab 12/07/14 0238 12/07/14 0841  TROPONINI 0.04* 0.05*    CBG:  Recent Labs Lab 12/07/14 0227 12/07/14 0405 12/07/14 0506 12/07/14 0638 12/07/14 0756 12/07/14 0910  GLUCAP >600* 599* >600* 346* 260* 271*   Urinalysis:  Recent Labs Lab 12/07/14 0112  COLORURINE YELLOW  LABSPEC 1.035*  PHURINE 5.0  GLUCOSEU >1000*  HGBUR NEGATIVE  BILIRUBINUR NEGATIVE  KETONESUR 15*  PROTEINUR NEGATIVE  UROBILINOGEN 0.2  NITRITE NEGATIVE  LEUKOCYTESUR NEGATIVE   Studies/Results: Ct Abdomen Pelvis Wo Contrast  12/07/2014   CLINICAL DATA:  Initial encounter. Weakness. Nausea and vomiting. Symptoms for 3 weeks.  EXAM: CT ABDOMEN AND PELVIS WITHOUT CONTRAST  TECHNIQUE: Multidetector CT imaging of the abdomen and pelvis was performed following the standard protocol without IV contrast.  COMPARISON:  12/07/2014.  FINDINGS: Musculoskeletal: Motion artifact is present on the sagittal reconstructions. No aggressive osseous lesions. Scattered Schmorl's nodes in the thoracic spine.  Lung Bases: Dependent atelectasis. Coronary artery atherosclerosis is present. If office based assessment of coronary risk factors has not been performed, it is now recommended.  Liver: Unenhanced CT was performed per clinician order. Lack of IV contrast limits sensitivity and specificity, especially for evaluation of abdominal/pelvic solid viscera. Old granulomatous disease of the liver.  Spleen: Tiny low-density lesion measuring under a cm, probably a cyst or hemangioma.  Gallbladder:  Normal.  Common bile duct:  Normal.  Pancreas:  Normal.  Adrenal glands:  Normal bilaterally.  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 9 mm nonobstructing LEFT inferior pole renal collecting system calculus. Vascular renal hilar calcifications bilaterally. Both ureters are normal.  Stomach:  Grossly normal.  Small bowel: Submucosal fatty infiltration is present along portions of distal small bowel, a nonspecific finding that can be seen in inflammatory bowel  disease. No stranding, bowel dilation or mesenteric adenopathy to suggest an acute abnormality.  Colon:   Normal appendix.  Diverticulosis without diverticulitis.  Pelvic Genitourinary:  Bladder partially collapsed.  Peritoneum: No free air or free fluid.  Vasculature: Atherosclerosis. Calcified mural plaque in the infrarenal abdominal aorta. No acute vascular abnormality.  Body Wall: Normal.  IMPRESSION: 1. No acute abnormality. 2. Nonobstructing 9 mm LEFT inferior pole renal collecting system calculus. 3. Atherosclerosis.   Electronically Signed   By: Dereck Ligas M.D.   On: 12/07/2014 07:49   Dg Chest 2 View  12/07/2014   CLINICAL DATA:  Dyspnea.  Fall.  EXAM: CHEST  2 VIEW  COMPARISON:  None.  FINDINGS: Normal heart size and aortic contours post CABG. There is no edema, consolidation, effusion, or pneumothorax. No acute osseous findings related to reported fall.  IMPRESSION: No active cardiopulmonary disease.   Electronically Signed   By: Monte Fantasia M.D.   On: 12/07/2014 04:35   US Abdomen Complete  12/07/2014   CLINICAL DATA:  Nausea and vomiting  EXAM: ULTRASOUND ABDOMEN COMPLETE  COMPARISON:  None.  FINDINGS: Gallbladder: No gallstones or wall thickening visualized. No sonographic Murphy sign noted.  Common bile duct: Diameter: 4 mm  Liver: Diffuse coarse and echogenic texture. No focal lesion is seen. Antegrade flow in the imaged portal venous system.  IVC: Not visualized  Pancreas: Not visualized  Spleen: Size and appearance within normal limits.  Right Kidney: Length: 10 cm. Echogenicity within normal limits. No mass or hydronephrosis visualized.  Left Kidney: Length: 10 cm. Echogenicity within normal limits. No mass or hydronephrosis visualized.  Abdominal aorta: Not visualized  IMPRESSION: 1. No explanation for nausea and vomiting. 2. Hepatic steatosis. 3. Nonvisualization of the pancreas, IVC, and aorta due to bowel gas.   Electronically Signed   By: Monte Fantasia M.D.   On: 12/07/2014  06:12   Medications: I have reviewed the patient's current medications. Prior to Admission:  (Not in a hospital admission) Scheduled Meds: . heparin  5,000 Units Subcutaneous 3 times per day   Continuous Infusions: . sodium chloride 1,000 mL (12/07/14 0236)  . dextrose 5 % and 0.45% NaCl    . insulin (NOVOLIN-R) infusion 8.4 Units/hr (12/07/14 0913)   PRN Meds:.ondansetron **OR** ondansetron (ZOFRAN) IV Assessment/Plan: Principal Problem:   DKA (diabetic ketoacidoses) Active Problems:   HTN (hypertension)   CAD (coronary artery disease) of artery bypass graft   History of CVA (cerebrovascular accident)   Diabetes mellitus, new onset   Chronic kidney disease (CKD), stage II (mild)   Hepatic steatosis  Anion-Gapped Diabetic Ketoacidosis: Patient presented with nausea and vomiting and was found to have a glucose of 968, UA with ketones 15 and an anion gap of 21. Unclear cause as previous HgbA1c 6.4 on 07/12/14. CXR showed no active cardiopulmonary disease. CT abdomen/pelvis was only significant for a non-obstructing 9 mm left inferior pold renal collecting system calculus. While lipase was elevated at 82, pancreas was normal on CT. Patient was started on an insulin drip with NS at 125 mL/hr and frequent BMET  and CBG checks. Patient's symptoms have greatly improved and he admits to hunger and thirst. AG is closed x 1 at 10 and glucose is down to 260. Lactic acid 3.1, osmolality 356, We will continue drip until gap is closed x 2, give Lantus (around 15 units), wait 1-2 hours and then d/c the drip. We will transition to D5-1/2NS once glucose is less than 250.  -Check BMET Q4H and CBGs QH2 prn  -HgbA1c pending -Normal saline 125 mL/hr -Give Lantus 15 units once gap closed x 2 and then 1-2 hours later d/c insulin gtt -Start D5-1/2 NS when CBG less than 250  Elevated Troponin: Troponin I 0.04. Patient denies any chest pain. Patient does have history of CABG. EKG showed tachycardia and  non-specific TWI in V2 and is without evidence of significant ST elevation or depression. EKG compared to prior on 07/04/14 and is similar. We will repeat EKG tomorrow morning and continue to trend troponins.  -Continue to trend troponins -Repeat EKG tomorrow am  Acute on Chronic Kidney Disease Stage II: Creatinine 2.3>1.73, up from baseline 1-1.2. Likely in the setting of his acute illness. Elevated magnesium at 3.4 and normal phosphorus.  -Holding home lisinopril/HCTZ pending resolution of AKI -Continue IVF -Continue to trend BMETs  Coronary Artery Disease s/p CABG: No chest pain or current shortness of breath. -EKG and troponin as noted above -Hold home aspirin pending resolution DKA  History of CVA: Found to have remote infarcts of the right cerebellum and left basal ganglion on 07/04/14. Unsure of his current gait instability is related to this. -Consult PT/OT -Hold lovastatin pending resolution of DKA  Hypertension: 135/96. -Continue to hold home lisinopril/HCTZ in setting of AKI  #FEN:  -Diet: NPO  #DVT prophylaxis: Heparin  #CODE STATUS: FULL CODE -Defer to wife Seth Bake [phone] if patients lacks decision-making capacity -Confirmed with patient on admission  Dispo: Disposition is deferred at this time, awaiting improvement of current medical problems.  Anticipated discharge in approximately 1-2 day(s).   The patient does have a current PCP [name], DO) and does need an [facility] hospital follow-up appointment after discharge.  The patient does not have transportation limitations that hinder transportation to clinic appointments.  .Services Needed at time of discharge: Y = Yes, Blank = No PT:   OT:   RN:   Equipment:   Other:     LOS: 0 days   Osa Craver, DO PGY-1 Internal Medicine Resident Pager # [phone] 12/07/2014 9:45 AM

## 2014-12-07 NOTE — H&P (Signed)
Date: 12/07/2014               Patient Name:  Jason Velasquez MRN: [id number]  DOB: 10-19-1955 Age / Sex: 59 y.o., male   PCP: Jason Groves, DO         Medical Service: Internal Medicine Teaching Service         Attending Physician: Dr. Aldine Contes, MD    First Contact: Dr. Osa Craver  Pager: [phone]  Second Contact: Dr. Bing Neighbors  Pager: [phone]       After Hours (After 5p/  First Contact Pager: [phone]  weekends / holidays): Second Contact Pager: [phone]   Chief Complaint: Hyperglycemia  History of Present Illness: [name] is a 59 year old male with hypertension, type 2 diabetes, coronary artery disease status post CABG, history of CVA who presents to the ED with hyperglycemia. His wife Jason Velasquez is present at bedside and contributed to the interview when he gave inconsistent details.  For the past 2-3 weeks, he reports that he has had increased weakness as well as trouble with his balance. He also reports associated dizziness which he describes as lightheadedness. He feels things got worse about maybe 1 week ago when he had a salad from the food bank which precipitated nausea and vomiting. He says his appetite has been poor during this time though he is hungry but just cannot tolerate food. Of note, he recently started a "fatty food" diet from one of his wife's magazines which recommends eating fatty foods in the morning to stave off  hunger later in the day; he reports his diet mostly consists of cheese, eggs, salads, grapes, cookies, candy, Coke. During this time, he also reports polydipsia [drinking 4-5 bottles of water], weight loss of about 16 pounds over the past month, polyuria, worsening blurry vision, metallic taste associated with drinking water though he denies any sick contacts, recent tobacco/alcohol/illicit drug use, abdominal pain, hematuria, hematochezia, chest pain, shortness of breath. He was last seen in clinic on 11/28/14 by his PCP, Dr. Heber [place] which  time he was started on lisinopril/HCTZ 10/12.5 mg and lovastatin 20 mg. He reports that he had been out of his medications since August as he lost his job in July. In the ED, he was found to have glucose 968 and was given 2 L of normal saline.    Meds: Current Facility-Administered Medications  Medication Dose Route Frequency Provider Last Rate Last Dose  . 0.9 %  sodium chloride infusion  1,000 mL Intravenous Continuous Shari A Upstill, PA-C      . dextrose 5 %-0.45 % sodium chloride infusion   Intravenous Continuous Shari A Upstill, PA-C      . insulin regular (NOVOLIN R,HUMULIN R) 250 Units in sodium chloride 0.9 % 250 mL (1 Units/mL) infusion   Intravenous Continuous Dewaine Oats, PA-C       Current Outpatient Prescriptions  Medication Sig Dispense Refill  . aspirin 81 MG tablet Take 2 pills daily 30 tablet 5  . lisinopril-hydrochlorothiazide (PRINZIDE) 10-12.5 MG per tablet Take 1 tablet by mouth daily. 30 tablet 5  . lovastatin (MEVACOR) 20 MG tablet Take 1 tablet (20 mg total) by mouth at bedtime. 30 tablet 11    Allergies: Allergies as of 12/06/2014  . (No Known Allergies)   Past Medical History  Diagnosis Date  . Hypertension   . Hypercholesterolemia   . CAD (coronary artery disease) of artery bypass graft 06/25/2014    CABG- 2007   . History  of CVA (cerebrovascular accident) 07/12/2014   Past Surgical History  Procedure Laterality Date  . Cardiac surgery    . Coronary artery bypass graft     Family History  Problem Relation Age of Onset  . Hypertension Mother   . Diabetes Father   . Hypertension Sister    History   Social History  . Marital Status: Divorced    Spouse Name: N/A  . Number of Children: N/A  . Years of Education: N/A   Occupational History  . Not on file.   Social History Main Topics  . Smoking status: Former Smoker    Quit date: 10/04/2009  . Smokeless tobacco: Not on file  . Alcohol Use: No  . Drug Use: Not on file  . Sexual  Activity: Not on file   Other Topics Concern  . Not on file   Social History Narrative    Review of Systems: Review of Systems  Constitutional: Positive for weight loss. Negative for fever.  Respiratory: Negative for cough and shortness of breath.   Cardiovascular: Negative for chest pain.  Gastrointestinal: Positive for nausea. Negative for vomiting, abdominal pain and diarrhea.  Genitourinary: Negative for hematuria.  Neurological: Positive for dizziness.  Endo/Heme/Allergies: Positive for polydipsia.  Psychiatric/Behavioral: Negative for substance abuse.     Physical Exam: Blood pressure 130/104, pulse 132, temperature 97.9 F (36.6 C), temperature source Oral, resp. rate 18, height 5\' 10"  (1.778 m), SpO2 98 %. General: Thin African-American male, NAD HEENT: PERRL, EOMI, no scleral icterus, dry mucous membranes Cardiac: Sinus tachycardia, no rubs, murmurs or gallops; midsternal scar consistent with prior CABG procedure Pulm: clear to auscultation bilaterally, no wheezes, rales, or rhonchi Abd: soft, nontender, nondistended, BS present, Murphy's sign negative Ext: warm and well perfused, no pedal or tibial edema Neuro: CN II-XII intact, 5/5 upper and lower extremity strength, 2+ grip strength, finger to nose intact, unable to assess rapid alternating movements as he could not follow instructions    Lab results: Basic Metabolic Panel:  Recent Labs  12/06/14 2301  NA 136  K 4.9  CL 95*  CO2 20  GLUCOSE 968*  BUN 41*  CREATININE 2.32*  CALCIUM 9.9   Liver Function Tests:  Recent Labs  12/06/14 2301  AST 20  ALT 29  ALKPHOS 103  BILITOT 1.3*  PROT 7.7  ALBUMIN 4.1    CBC:  Recent Labs  12/06/14 2301  WBC 8.0  NEUTROABS 5.7  HGB 16.3  HCT 49.1  MCV 80.8  PLT 303    Urinalysis:  Recent Labs  12/07/14 0112  COLORURINE YELLOW  LABSPEC 1.035*  PHURINE 5.0  GLUCOSEU >1000*  HGBUR NEGATIVE  BILIRUBINUR NEGATIVE  KETONESUR 15*  PROTEINUR  NEGATIVE  UROBILINOGEN 0.2  NITRITE NEGATIVE  LEUKOCYTESUR NEGATIVE     Assessment & Plan by Problem: Diabetic ketoacidosis: UA with ketones 15 and anion gap 21 in conjunction with elevated glucose as noted above though A1c 6.4 on 07/12/14. Unsure of what may precipitated this episode though he did have suspected food poisoning from eating a salad. Poor diet is also contributory. Polyuria, polydipsia, weight loss likely related to his diabetes. Mildly elevated bilirubin suggestive of hepatobiliary process though physical exam findings were otherwise reassuring. ACS less likely in the absence of chest pain though cannot be ascertained without an EKG. Infection, like pneumonia, appears also less likely in the absence of fever or elevated white count. -Check BMET every 4 hours and CBGs every 2 hours -Recheck A1c -Continue normal saline  125 mL an hour -Continue Glucomander until anion gap is closed on BMET 2  -Start D5 half-normal saline when CBG less than 250 -Check point-of-care troponin and EKG to rule out ACS -Order chest x-ray  -Check complete abdominal ultrasound, lipase to better characterize his abnormal total bilirubin -Check lactate and serum ketones  Acute on chronic kidney disease stage II: Creatinine 2.3, up from baseline 1-1.2. Likely in the setting of his acute illness. -Check magnesium and phosphorus -Holding home lisinopril/HCTZ pending resolution of AKI  Coronary artery disease status post CABG: No chest pain at the time of interview is reassuring. Currently ACS may be a trigger for DKA, especially given prior history of cardiac disease. -EKG and troponin as noted above -Hold home aspirin pending resolution DKA  History of CVA: Found to have remote infarcts of the right cerebellum and left basal ganglion on 07/04/14. Unsure of his current gait instability is related to this. -Consult PT/OT -Hold lovastatin pending resolution of DKA  Hypertension: Blood pressure initially  elevated but now 112/66. -As noted above  #FEN:  -Diet: Nothing by mouth  #DVT prophylaxis: Heparin  #CODE STATUS: FULL CODE -Defer to wife Jason Velasquez [phone] if patients lacks decision-making capacity -Confirmed with patient on admission  Dispo: Disposition is deferred at this time, awaiting improvement of current medical problems.   The patient does have a current PCP Jason Groves, DO) and does need an [facility] hospital follow-up appointment after discharge.  The patient does not know have transportation limitations that hinder transportation to clinic appointments.  Signed: Charlott Rakes, MD 12/07/2014, 1:25 AM

## 2014-12-07 NOTE — ED Notes (Addendum)
Patient transported to radiology

## 2014-12-07 NOTE — ED Provider Notes (Signed)
CSN: [id number]     Arrival date & time 12/06/14  2250 History   First MD Initiated Contact with Patient 12/07/14 0019     Chief Complaint  Patient presents with  . Weakness     (Consider location/radiation/quality/duration/timing/severity/associated sxs/prior Treatment) Patient is a 59 y.o. male presenting with weakness. The history is provided by the patient. No language interpreter was used.  Weakness This is a new problem. The current episode started in the past 7 days. Associated symptoms include nausea, vomiting and weakness. Pertinent negatives include no abdominal pain, arthralgias, chest pain, chills, fever, headaches or myalgias. Associated symptoms comments: Increasing weakness over the last week causing multiple falls without injury. He has had excessive urination without dysuria or hematuria. No fevers. He denies cough, congestion, headache, visual changes. He has experienced nausea with vomiting x 2 in the last 1-2 days. No chest pain, SOB or peripheral swelling..    Past Medical History  Diagnosis Date  . Hypertension   . Hypercholesterolemia   . CAD (coronary artery disease) of artery bypass graft 06/25/2014    CABG- 2007   . History of CVA (cerebrovascular accident) 07/12/2014  . DKA (diabetic ketoacidoses) 12/07/2014   Past Surgical History  Procedure Laterality Date  . Cardiac surgery    . Coronary artery bypass graft     Family History  Problem Relation Age of Onset  . Hypertension Mother   . Diabetes Father   . Hypertension Sister    History  Substance Use Topics  . Smoking status: Former Smoker    Quit date: 10/04/2009  . Smokeless tobacco: Not on file  . Alcohol Use: No    Review of Systems  Constitutional: Negative for fever and chills.  HENT: Negative.   Respiratory: Negative.  Negative for shortness of breath.   Cardiovascular: Negative.  Negative for chest pain.  Gastrointestinal: Positive for nausea and vomiting. Negative for abdominal pain.   Endocrine: Positive for polydipsia.  Genitourinary: Positive for frequency. Negative for dysuria and hematuria.  Musculoskeletal: Negative.  Negative for myalgias and arthralgias.  Skin: Negative.  Negative for wound.  Neurological: Positive for weakness and light-headedness. Negative for syncope and headaches.  Psychiatric/Behavioral: Negative for confusion.      Allergies  Review of patient's allergies indicates no known allergies.  Home Medications   Prior to Admission medications   Medication Sig Start Date End Date Taking? Authorizing Provider  aspirin 81 MG tablet Take 2 pills daily 07/12/14  Yes Jessee Avers, MD  lisinopril-hydrochlorothiazide (PRINZIDE) 10-12.5 MG per tablet Take 1 tablet by mouth daily. 11/28/14 11/28/15 Yes [name], DO  lovastatin (MEVACOR) 20 MG tablet Take 1 tablet (20 mg total) by mouth at bedtime. 11/28/14 11/28/15 Yes Erik C Hoffman, DO   BP 112/66 mmHg  Pulse 112  Temp(Src) 97.7 F (36.5 C) (Oral)  Resp 18  Ht 5\' 10"  (1.778 m)  SpO2 98% Physical Exam  Constitutional: He is oriented to person, place, and time. He appears well-developed and well-nourished.  HENT:  Head: Normocephalic.  Mouth/Throat: Mucous membranes are dry.  Neck: Normal range of motion. Neck supple.  Cardiovascular: Normal rate and regular rhythm.   Pulmonary/Chest: Effort normal and breath sounds normal.  Abdominal: Soft. Bowel sounds are normal. There is no tenderness. There is no rebound and no guarding.  Musculoskeletal: Normal range of motion. He exhibits no edema.  Neurological: He is alert and oriented to person, place, and time.  Skin: Skin is warm and dry. No rash noted.  Psychiatric: He has a normal mood and affect.    ED Course  Procedures (including critical care time) Labs Review Labs Reviewed  CBC WITH DIFFERENTIAL/PLATELET - Abnormal; Notable for the following:    RBC 6.08 (*)    All other components within normal limits  COMPREHENSIVE METABOLIC  PANEL - Abnormal; Notable for the following:    Chloride 95 (*)    Glucose, Bld 968 (*)    BUN 41 (*)    Creatinine, Ser 2.32 (*)    Total Bilirubin 1.3 (*)    GFR calc non Af Amer 29 (*)    GFR calc Af Amer 34 (*)    Anion gap 21 (*)    All other components within normal limits  URINALYSIS, ROUTINE W REFLEX MICROSCOPIC - Abnormal; Notable for the following:    Specific Gravity, Urine 1.035 (*)    Glucose, UA >1000 (*)    Ketones, ur 15 (*)    All other components within normal limits  BASIC METABOLIC PANEL - Abnormal; Notable for the following:    Glucose, Bld 799 (*)    BUN 42 (*)    Creatinine, Ser 2.00 (*)    GFR calc non Af Amer 35 (*)    GFR calc Af Amer 40 (*)    All other components within normal limits  MAGNESIUM - Abnormal; Notable for the following:    Magnesium 3.4 (*)    All other components within normal limits  BILIRUBIN, FRACTIONATED(TOT/DIR/INDIR) - Abnormal; Notable for the following:    Total Bilirubin 1.4 (*)    Indirect Bilirubin 1.2 (*)    All other components within normal limits  LIPASE, BLOOD - Abnormal; Notable for the following:    Lipase 82 (*)    All other components within normal limits  TROPONIN I - Abnormal; Notable for the following:    Troponin I 0.04 (*)    All other components within normal limits  LACTIC ACID, PLASMA - Abnormal; Notable for the following:    Lactic Acid, Venous 2.2 (*)    All other components within normal limits  CBG MONITORING, ED - Abnormal; Notable for the following:    Glucose-Capillary >600 (*)    All other components within normal limits  MRSA PCR SCREENING  PHOSPHORUS  URINE MICROSCOPIC-ADD ON  SODIUM, URINE, RANDOM  UREA NITROGEN, URINE  BASIC METABOLIC PANEL  BASIC METABOLIC PANEL  BASIC METABOLIC PANEL  HIV ANTIBODY (ROUTINE TESTING)  BETA-HYDROXYBUTYRIC ACID  HEMOGLOBIN A1C  OSMOLALITY  URINE RAPID DRUG SCREEN (HOSP PERFORMED)    Imaging Review No results found.   EKG Interpretation None       MDM   Final diagnoses:  Diabetic ketoacidosis without coma associated with type 2 diabetes mellitus  Diabetes mellitus, new onset    He appears well. VS - tachycardia without hypoxia or fever. He appears dry.  Glucose significantly elevated (968) without history of DM, with anion gap of 21 indicating DKA. IV boluses started. Patient placed on glucomander for stabilization. No further vomiting in ED. Discussed with Internal Medicine for admission.     Dewaine Oats, PA-C 12/07/14 0407  Everlene Balls, MD 12/07/14 [phone]

## 2014-12-07 NOTE — Discharge Summary (Signed)
Name: Jason Velasquez MRN: [id number] DOB: 05-Mar-1956 59 y.o. PCP: [name], DO  Date of Admission: 12/07/2014 12:12 AM Date of Discharge: 12/09/2014 Attending Physician: Aldine Contes, MD  Discharge Diagnosis:  Principal Problem:   DKA (diabetic ketoacidoses) Active Problems:   HTN (hypertension)   CAD (coronary artery disease) of artery bypass graft   History of CVA (cerebrovascular accident)   Diabetes mellitus, new onset   Chronic kidney disease (CKD), stage II (mild)   Hepatic steatosis   Left nephrolithiasis  Discharge Medications:   Medication List    STOP taking these medications        lisinopril-hydrochlorothiazide 10-12.5 MG per tablet  Commonly known as:  PRINZIDE      TAKE these medications        aspirin 81 MG tablet  Take 2 pills daily     BLOOD GLUCOSE METER DISPOSABLE Devi  1 strip by Does not apply route 4 (four) times daily -  before meals and at bedtime.     insulin NPH-regular Human (70-30) 100 UNIT/ML injection  Commonly known as:  NOVOLIN 70/30 RELION  Inject 30 Units into the skin 2 (two) times daily with a meal.     Insulin Syringes (Disposable) U-100 0.5 ML Misc  1 applicator by Does not apply route 2 (two) times daily with a meal.     Lancets Misc  1 Device by Does not apply route 4 (four) times daily -  before meals and at bedtime.     living well with diabetes book Misc  1 each by Does not apply route once.     lovastatin 20 MG tablet  Commonly known as:  MEVACOR  Take 1 tablet (20 mg total) by mouth at bedtime.     RELION PRIME MONITOR Devi  1 Device by Does not apply route 4 (four) times daily -  with meals and at bedtime.        Disposition and follow-up:   Jason Velasquez was discharged from [facility] in Good condition.  At the hospital follow up visit please address:  1.  New-onset T2DM: HgbA1c >15. Please assess glucose control on 70/30 30 units BID. Please assess patient's understanding of  insulin administration and checking glucose. Consider diabetes education with Butch Penny.  HTN: Normotensive off meds. ACEI and HCTZ likely causing AKI.   AKI: Resolved, but please consider repeating BMET  2.  Labs / imaging needed at time of follow-up: BMET  3.  Pending labs/ test needing follow-up: None  Follow-up Appointments: Follow-up Information    Follow up with [name], DO On 12/19/2014.   Specialty:  Internal Medicine   Why:  2:15 pm   Contact information:   [place] [id number] [phone]       Discharge Instructions:   Consultations:  None  Procedures Performed:  Ct Abdomen Pelvis Wo Contrast  12/07/2014   CLINICAL DATA:  Initial encounter. Weakness. Nausea and vomiting. Symptoms for 3 weeks.  EXAM: CT ABDOMEN AND PELVIS WITHOUT CONTRAST  TECHNIQUE: Multidetector CT imaging of the abdomen and pelvis was performed following the standard protocol without IV contrast.  COMPARISON:  12/07/2014.  FINDINGS: Musculoskeletal: Motion artifact is present on the sagittal reconstructions. No aggressive osseous lesions. Scattered Schmorl's nodes in the thoracic spine.  Lung Bases: Dependent atelectasis. Coronary artery atherosclerosis is present. If office based assessment of coronary risk factors has not been performed, it is now recommended.  Liver: Unenhanced CT was  performed per clinician order. Lack of IV contrast limits sensitivity and specificity, especially for evaluation of abdominal/pelvic solid viscera. Old granulomatous disease of the liver.  Spleen: Tiny low-density lesion measuring under a cm, probably a cyst or hemangioma.  Gallbladder:  Normal.  Common bile duct:  Normal.  Pancreas:  Normal.  Adrenal glands:  Normal bilaterally.  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 9 mm nonobstructing LEFT inferior pole renal collecting system calculus. Vascular renal hilar calcifications bilaterally. Both ureters are  normal.  Stomach:  Grossly normal.  Small bowel: Submucosal fatty infiltration is present along portions of distal small bowel, a nonspecific finding that can be seen in inflammatory bowel disease. No stranding, bowel dilation or mesenteric adenopathy to suggest an acute abnormality.  Colon:   Normal appendix.  Diverticulosis without diverticulitis.  Pelvic Genitourinary:  Bladder partially collapsed.  Peritoneum: No free air or free fluid.  Vasculature: Atherosclerosis. Calcified mural plaque in the infrarenal abdominal aorta. No acute vascular abnormality.  Body Wall: Normal.  IMPRESSION: 1. No acute abnormality. 2. Nonobstructing 9 mm LEFT inferior pole renal collecting system calculus. 3. Atherosclerosis.   Electronically Signed   By: Dereck Ligas M.D.   On: 12/07/2014 07:49   Dg Chest 2 View  12/07/2014   CLINICAL DATA:  Dyspnea.  Fall.  EXAM: CHEST  2 VIEW  COMPARISON:  None.  FINDINGS: Normal heart size and aortic contours post CABG. There is no edema, consolidation, effusion, or pneumothorax. No acute osseous findings related to reported fall.  IMPRESSION: No active cardiopulmonary disease.   Electronically Signed   By: Monte Fantasia M.D.   On: 12/07/2014 04:35   US Abdomen Complete  12/07/2014   CLINICAL DATA:  Nausea and vomiting  EXAM: ULTRASOUND ABDOMEN COMPLETE  COMPARISON:  None.  FINDINGS: Gallbladder: No gallstones or wall thickening visualized. No sonographic Murphy sign noted.  Common bile duct: Diameter: 4 mm  Liver: Diffuse coarse and echogenic texture. No focal lesion is seen. Antegrade flow in the imaged portal venous system.  IVC: Not visualized  Pancreas: Not visualized  Spleen: Size and appearance within normal limits.  Right Kidney: Length: 10 cm. Echogenicity within normal limits. No mass or hydronephrosis visualized.  Left Kidney: Length: 10 cm. Echogenicity within normal limits. No mass or hydronephrosis visualized.  Abdominal aorta: Not visualized  IMPRESSION: 1. No explanation  for nausea and vomiting. 2. Hepatic steatosis. 3. Nonvisualization of the pancreas, IVC, and aorta due to bowel gas.   Electronically Signed   By: Monte Fantasia M.D.   On: 12/07/2014 06:12    Admission HPI: Jason Velasquez is a 59 year old male with hypertension, type 2 diabetes, coronary artery disease status post CABG, history of CVA who presents to the ED with hyperglycemia. His wife Seth Bake is present at bedside and contributed to the interview when he gave inconsistent details.  For the past 2-3 weeks, he reports that he has had increased weakness as well as trouble with his balance. He also reports associated dizziness which he describes as lightheadedness. He feels things got worse about maybe 1 week ago when he had a salad from the food bank which precipitated nausea and vomiting. He says his appetite has been poor during this time though he is hungry but just cannot tolerate food. Of note, he recently started a "fatty food" diet from one of his wife's magazines which recommends eating fatty foods in the morning to stave off hunger later in the day; he reports  his diet mostly consists of cheese, eggs, salads, grapes, cookies, candy, Coke. During this time, he also reports polydipsia [drinking 4-5 bottles of water], weight loss of about 16 pounds over the past month, polyuria, worsening blurry vision, metallic taste associated with drinking water though he denies any sick contacts, recent tobacco/alcohol/illicit drug use, abdominal pain, hematuria, hematochezia, chest pain, shortness of breath. He was last seen in clinic on 11/28/14 by his PCP, Dr. Heber [place] which time he was started on lisinopril/HCTZ 10/12.5 mg and lovastatin 20 mg. He reports that he had been out of his medications since August as he lost his job in July. In the ED, he was found to have glucose 968 and was given 2 L of normal saline.  Hospital Course by problem list: Principal Problem:   DKA (diabetic ketoacidoses) Active  Problems:   HTN (hypertension)   CAD (coronary artery disease) of artery bypass graft   History of CVA (cerebrovascular accident)   Diabetes mellitus, new onset   Chronic kidney disease (CKD), stage II (mild)   Hepatic steatosis   Left nephrolithiasis   Diabetic Ketoacidosis vs HHS: Secondary to new-onset T2DM.  Patient presented with nausea and vomiting and was found to have a glucose of 968, UA with ketones 15 and an anion gap of 21. Previous HgbA1c 6.4 on 07/12/14, HgbA1c >15 on admission. CXR showed no active cardiopulmonary disease. CT abdomen/pelvis was only significant for a non-obstructing 9 mm left inferior pold renal collecting system calculus. Patient's DKA versus HHS resolved and patient was managed on NPH 22 units BID. However, his blood glucose remained elevated. Given concern for patient understanding of insulin administration, patient was kept for an additional day for education. Patient was discharged on Novolin 70/30 30 units BID and given appropriate supplies for new-onset diabetes. On follow up, please assess glucose control on current regimen and adjust as necessary. Please evaluate patient's understand of glucose checks and insulin administration.   Acute on Chronic Kidney Disease Stage II: Resolved. Creatinine 2.3>1.73>1.3>1.7>>1.7>1.19, baseline 1-1.2 (last check in October). AKI may be secondary to dehydration and ACEI/HCTZ intolerance. We discontinued Lisinopril/HCTZ. Please repeat BMET as outpatient on discharge  Coronary Artery Disease s/p CABG: No chest pain or shortness of breath.Patient is on ASA at home, statin and lisinopril. Troponins trended down 0.04>0.05>0.06>0.04>0.05>0.04. Patient denies any chest pain or shortness of breath. EKG showed tachycardia and non-specific TWI in V2 and is without evidence of significant ST elevation or depression. Repeat EKG unchanged from prior. ASA 81 mg daily, lovastatin 20 mg daily continued on discharge.  History of CVA: Found to  have remote infarcts of the right cerebellum and left basal ganglion on 07/04/14. PT recommended home PT which was ordered.   Hypertension: Patient was normotensive off of medications. Due to AKI likely secondary to ACEI/HCTZ, we discontinued ACEI/HCTZ. Please follow up blood pressure on discharge and consider BB or very low dose ACEI on follow up and trend BMETs if restarted on ACEI.   Discharge Vitals:   BP 123/57 mmHg  Pulse 78  Temp(Src) 99.3 F (37.4 C) (Oral)  Resp 16  Ht 5\' 10"  (1.778 m)  Wt 179 lb 7.3 oz (81.4 kg)  BMI 25.75 kg/m2  SpO2 100%  Discharge Labs:  Results for orders placed or performed during the hospital encounter of 12/07/14 (from the past 24 hour(s))  Glucose, capillary     Status: Abnormal   Collection Time: 12/08/14  5:36 PM  Result Value Ref Range   Glucose-Capillary 294 (H) 70 -  99 mg/dL  Glucose, capillary     Status: Abnormal   Collection Time: 12/08/14  8:04 PM  Result Value Ref Range   Glucose-Capillary 391 (H) 70 - 99 mg/dL  Glucose, capillary     Status: Abnormal   Collection Time: 12/09/14 12:05 AM  Result Value Ref Range   Glucose-Capillary 372 (H) 70 - 99 mg/dL   Comment 1 Notify RN    Comment 2 Documented in Char   Glucose, capillary     Status: Abnormal   Collection Time: 12/09/14  5:09 AM  Result Value Ref Range   Glucose-Capillary 325 (H) 70 - 99 mg/dL  Glucose, capillary     Status: Abnormal   Collection Time: 12/09/14  7:49 AM  Result Value Ref Range   Glucose-Capillary 309 (H) 70 - 99 mg/dL  Basic metabolic panel     Status: Abnormal   Collection Time: 12/09/14  8:40 AM  Result Value Ref Range   Sodium 137 135 - 145 mmol/L   Potassium 3.8 3.5 - 5.1 mmol/L   Chloride 107 96 - 112 mmol/L   CO2 25 19 - 32 mmol/L   Glucose, Bld 329 (H) 70 - 99 mg/dL   BUN 31 (H) 6 - 23 mg/dL   Creatinine, Ser 1.19 0.50 - 1.35 mg/dL   Calcium 8.4 8.4 - 10.5 mg/dL   GFR calc non Af Amer 65 (L) >90 mL/min   GFR calc Af Amer 76 (L) >90 mL/min    Anion gap 5 5 - 15  Glucose, capillary     Status: Abnormal   Collection Time: 12/09/14 12:38 PM  Result Value Ref Range   Glucose-Capillary 277 (H) 70 - 99 mg/dL    Signed: Osa Craver, DO PGY-1 Internal Medicine Resident Pager # [phone] 12/09/2014 3:12 PM

## 2014-12-07 NOTE — ED Notes (Signed)
Spoke with Dr. Marvel Plan, and she will notify us when she wants [place] to stop the Insulin drip.  She wants the pt. To receive the NPH insulin and to eat  Lunch.

## 2014-12-07 NOTE — ED Notes (Signed)
Patient transported to Ultrasound 

## 2014-12-08 DIAGNOSIS — R740 Nonspecific elevation of levels of transaminase and lactic acid dehydrogenase [LDH]: Secondary | ICD-10-CM

## 2014-12-08 DIAGNOSIS — N182 Chronic kidney disease, stage 2 (mild): Secondary | ICD-10-CM

## 2014-12-08 DIAGNOSIS — N179 Acute kidney failure, unspecified: Secondary | ICD-10-CM

## 2014-12-08 DIAGNOSIS — E1010 Type 1 diabetes mellitus with ketoacidosis without coma: Secondary | ICD-10-CM

## 2014-12-08 DIAGNOSIS — I129 Hypertensive chronic kidney disease with stage 1 through stage 4 chronic kidney disease, or unspecified chronic kidney disease: Secondary | ICD-10-CM

## 2014-12-08 LAB — HIV ANTIBODY (ROUTINE TESTING W REFLEX): HIV Screen 4th Generation wRfx: NONREACTIVE

## 2014-12-08 LAB — BASIC METABOLIC PANEL
ANION GAP: 10 (ref 5–15)
BUN: 45 mg/dL — ABNORMAL HIGH (ref 6–23)
CHLORIDE: 110 mmol/L (ref 96–112)
CO2: 23 mmol/L (ref 19–32)
Calcium: 8.8 mg/dL (ref 8.4–10.5)
Creatinine, Ser: 1.77 mg/dL — ABNORMAL HIGH (ref 0.50–1.35)
GFR calc non Af Amer: 40 mL/min — ABNORMAL LOW (ref >90)
GFR, EST AFRICAN AMERICAN: 47 mL/min — AB (ref >90)
Glucose, Bld: 350 mg/dL — ABNORMAL HIGH (ref 70–99)
POTASSIUM: 4.5 mmol/L (ref 3.5–5.1)
Sodium: 143 mmol/L (ref 135–145)

## 2014-12-08 LAB — TROPONIN I
Troponin I: 0.05 ng/mL — ABNORMAL HIGH (ref <0.031)
Troponin I: 0.04 ng/mL — ABNORMAL HIGH (ref <0.031)

## 2014-12-08 LAB — GLUCOSE, CAPILLARY
GLUCOSE-CAPILLARY: 355 mg/dL — AB (ref 70–99)
GLUCOSE-CAPILLARY: 368 mg/dL — AB (ref 70–99)
Glucose-Capillary: 371 mg/dL — ABNORMAL HIGH (ref 70–99)
Glucose-Capillary: 294 mg/dL — ABNORMAL HIGH (ref 70–99)

## 2014-12-08 MED ORDER — INSULIN ASPART 100 UNIT/ML ~~LOC~~ SOLN
0.0000 [IU] | Freq: Three times a day (TID) | SUBCUTANEOUS | Status: DC
  Administered 2014-12-08: 15 [IU] via SUBCUTANEOUS
  Administered 2014-12-08: 8 [IU] via SUBCUTANEOUS
  Administered 2014-12-09: 5 [IU] via SUBCUTANEOUS
  Administered 2014-12-09: 8 [IU] via SUBCUTANEOUS
  Administered 2014-12-09: 11 [IU] via SUBCUTANEOUS

## 2014-12-08 MED ORDER — INSULIN NPH (HUMAN) (ISOPHANE) 100 UNIT/ML ~~LOC~~ SUSP
15.0000 [IU] | Freq: Two times a day (BID) | SUBCUTANEOUS | Status: DC
  Administered 2014-12-08 (×2): 15 [IU] via SUBCUTANEOUS

## 2014-12-08 MED ORDER — ASPIRIN EC 81 MG PO TBEC
81.0000 mg | DELAYED_RELEASE_TABLET | Freq: Every day | ORAL | Status: DC
  Administered 2014-12-08 – 2014-12-09 (×2): 81 mg via ORAL
  Filled 2014-12-08 (×2): qty 1

## 2014-12-08 MED ORDER — INSULIN STARTER KIT- SYRINGES (ENGLISH)
1.0000 | Freq: Once | Status: AC
  Administered 2014-12-08: 1
  Filled 2014-12-08: qty 1

## 2014-12-08 NOTE — Progress Notes (Signed)
PT Cancellation Note  Patient Details Name: Jason Velasquez MRN: [id number] DOB: Apr 17, 1956   Cancelled Treatment:    Reason Eval/Treat Not Completed: Medical issues which prohibited therapy Patient continues to have elevated troponin. Per departmental guideline will hold formal therapy evaluation until pt demonstrates downward trend of troponin or is cleared by MD.  Ellouise Newer 12/08/2014, 8:48 AM Elayne Snare, [place]

## 2014-12-08 NOTE — Evaluation (Signed)
Physical Therapy Evaluation Patient Details Name: Jason Velasquez MRN: [id number] DOB: Feb 12, 1956 Today's Date: 12/08/2014   History of Present Illness  59 y.o. male admitted with diabetic ketoacidosis. Hx of CVA.  Clinical Impression  Pt admitted with above diagnosis. Pt currently with functional limitations due to the deficits listed below (see PT Problem List). Reports he has had several falls at home in the past month. Ambulates with minor balance deficits today and declines using a cane for support. He did not require physical assist for balance and states that although he feels that he is tipping a little towards his left side, his balance feels much better today than it has in a while. Jason Velasquez would greatly benefit from HHPT follow up for home evaluation. Pt will benefit from skilled PT to increase their independence and safety with mobility to allow discharge to the venue listed below.       Follow Up Recommendations Home health PT;Supervision - Intermittent    Equipment Recommendations  Cane    Recommendations for Other Services       Precautions / Restrictions Precautions Precautions: Fall Restrictions Weight Bearing Restrictions: No      Mobility  Bed Mobility               General bed mobility comments: Edge of bed at start of therapy  Transfers Overall transfer level: Modified independent                  Ambulation/Gait Ambulation/Gait assistance: Supervision Ambulation Distance (Feet): 175 Feet Assistive device: None Gait Pattern/deviations: Step-through pattern;Decreased stride length;Staggering right     General Gait Details: Ambulates with occasional stagger towards his right side. States he feels as if he is leaning towards his left side. Denies dizziness. No balance loss that required physical assistance. Tolerated dynamic gait challenges including high marching, variable speeds, quick turns and backwards stepping. Declines use of  cane.  Stairs            Wheelchair Mobility    Modified Rankin (Stroke Patients Only)       Balance Overall balance assessment: Needs assistance;History of Falls Sitting-balance support: No upper extremity supported;Feet supported Sitting balance-Leahy Scale: Normal     Standing balance support: No upper extremity supported Standing balance-Leahy Scale: Fair                               Pertinent Vitals/Pain Pain Assessment: No/denies pain    Home Living Family/patient expects to be discharged to:: Private residence Living Arrangements: Alone Available Help at Discharge: Neighbor;Available PRN/intermittently;Family Type of Home: House Home Access: Stairs to enter Entrance Stairs-Rails: Psychiatric nurse of Steps: 5 Home Layout: One level Home Equipment: None Additional Comments: States his ex-wife checks on him    Prior Function Level of Independence: Independent               Hand Dominance   Dominant Hand: Right    Extremity/Trunk Assessment   Upper Extremity Assessment: Defer to OT evaluation           Lower Extremity Assessment: Overall WFL for tasks assessed (normal heel to shin test)         Communication   Communication: No difficulties  Cognition Arousal/Alertness: Awake/alert Behavior During Therapy: WFL for tasks assessed/performed Overall Cognitive Status: Within Functional Limits for tasks assessed  General Comments General comments (skin integrity, edema, etc.): States he has had several falls at home in the past month. Cannot recall why he falls, and thinks it is due to his feet being dry.    Exercises        Assessment/Plan    PT Assessment Patient needs continued PT services  PT Diagnosis Difficulty walking;Abnormality of gait   PT Problem List Decreased activity tolerance;Decreased balance;Decreased mobility;Decreased knowledge of use of DME;Decreased  safety awareness  PT Treatment Interventions DME instruction;Gait training;Stair training;Functional mobility training;Therapeutic activities;Therapeutic exercise;Balance training;Neuromuscular re-education;Patient/family education   PT Goals (Current goals can be found in the Care Plan section) Acute Rehab PT Goals Patient Stated Goal: go home PT Goal Formulation: With patient Time For Goal Achievement: 12/22/14 Potential to Achieve Goals: Good    Frequency Min 3X/week   Barriers to discharge Decreased caregiver support lives alone    Co-evaluation               End of Session Equipment Utilized During Treatment: Gait belt Activity Tolerance: Patient tolerated treatment well Patient left: in chair;with call bell/phone within reach;with chair alarm set Nurse Communication: Mobility status         Time: 0932-3557 PT Time Calculation (min) (ACUTE ONLY): 32 min   Charges:   PT Evaluation $Initial PT Evaluation Tier I: 1 Procedure PT Treatments $Gait Training: 8-22 mins   PT G CodesEllouise Newer 12/08/2014, 4:28 PM Camille Bal [place], [place]

## 2014-12-08 NOTE — Progress Notes (Signed)
OT Cancellation Note  Patient Details Name: Jason Velasquez MRN: [id number] DOB: April 11, 1956   Cancelled Treatment:    Reason Eval/Treat Not Completed: Medical issues which prohibited therapy. Pt with continued elevated troponins. Acute OT will hold evaluation until pt medically ready.   V[place] Herb M 12/08/2014, 9:10 AM

## 2014-12-08 NOTE — Progress Notes (Signed)
Subjective:  Patient was seen and examined this morning. Patient is feeling well, tolerating a regular diet, no nausea, vomiting or abdominal pain. No headaches, fever or chills. Patient does feel overwhelmed with new diagnosis of diabetes.   Objective: Vital signs in last 24 hours: Filed Vitals:   12/07/14 1400 12/07/14 1427 12/07/14 2140 12/08/14 0533  BP: 119/87 116/77 130/70 129/72  Pulse: 104   92  Temp:  97.8 F (36.6 C) 99.4 F (37.4 C) 99.4 F (37.4 C)  TempSrc:  Oral Axillary Oral  Resp: _0 Height:  5' 10" (1.778 m)    Weight:  179 lb 7.3 oz (81.4 kg)  179 lb 7.3 oz (81.4 kg)  SpO2: 99% 100% 99% 100%   General: Vital signs reviewed.  Patient is well-developed and well-nourished, in no acute distress and cooperative with exam.  Cardiovascular: RRR Pulmonary/Chest: Clear to auscultation bilaterally, no wheezes, rales, or rhonchi. Abdominal: Soft, non-tender, non-distended, BS + Extremities: No lower extremity edema bilaterally Neurological: A&O x3 Psychiatric: Normal mood and affect. Speech and behavior is normal. Cognition and memory are normal.   Lab Results: Basic Metabolic Panel:  Recent Labs Lab 12/07/14 0238  12/07/14 1558 12/08/14 0557  NA 140  < > 151* 143  K 4.6  < > 3.8 4.5  CL 104  < > 116* 110  CO2 22  < > 26 23  GLUCOSE 799*  < > 151* 350*  BUN 42*  < > 43* 45*  CREATININE 2.00*  < > 1.77* 1.77*  CALCIUM 9.1  < > 9.5 8.8  MG 3.4*  --   --   --   PHOS 4.2  --   --   --   < > = values in this interval not displayed. Liver Function Tests:  Recent Labs Lab 12/06/14 2301 12/07/14 0238  AST 20  --   ALT 29  --   ALKPHOS 103  --   BILITOT 1.3* 1.4*  PROT 7.7  --   ALBUMIN 4.1  --     Recent Labs Lab 12/07/14 0238  LIPASE 82*   CBC:  Recent Labs Lab 12/06/14 2301  WBC 8.0  NEUTROABS 5.7  HGB 16.3  HCT 49.1  MCV 80.8  PLT 303   Cardiac Enzymes:  Recent Labs Lab 12/07/14 2149 12/08/14 0557 12/08/14 1040    TROPONINI 0.04* 0.05* 0.04*   CBG:  Recent Labs Lab 12/07/14 1606 12/07/14 1805 12/07/14 2136 12/08/14 0001 12/08/14 0411 12/08/14 0805  GLUCAP 213* 90 387* 356* 355* 371*   Urinalysis:  Recent Labs Lab 12/07/14 0112  COLORURINE YELLOW  LABSPEC 1.035*  PHURINE 5.0  GLUCOSEU >1000*  HGBUR NEGATIVE  BILIRUBINUR NEGATIVE  KETONESUR 15*  PROTEINUR NEGATIVE  UROBILINOGEN 0.2  NITRITE NEGATIVE  LEUKOCYTESUR NEGATIVE   Studies/Results: Ct Abdomen Pelvis Wo Contrast  12/07/2014   CLINICAL DATA:  Initial encounter. Weakness. Nausea and vomiting. Symptoms for 3 weeks.  EXAM: CT ABDOMEN AND PELVIS WITHOUT CONTRAST  TECHNIQUE: Multidetector CT imaging of the abdomen and pelvis was performed following the standard protocol without IV contrast.  COMPARISON:  12/07/2014.  FINDINGS: Musculoskeletal: Motion artifact is present on the sagittal reconstructions. No aggressive osseous lesions. Scattered Schmorl's nodes in the thoracic spine.  Lung Bases: Dependent atelectasis. Coronary artery atherosclerosis is present. If office based assessment of coronary risk factors has not been performed, it is now recommended.  Liver: Unenhanced CT was performed per clinician order. Lack of IV contrast limits sensitivity  and specificity, especially for evaluation of abdominal/pelvic solid viscera. Old granulomatous disease of the liver.  Spleen: Tiny low-density lesion measuring under a cm, probably a cyst or hemangioma.  Gallbladder:  Normal.  Common bile duct:  Normal.  Pancreas:  Normal.  Adrenal glands:  Normal bilaterally.  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 9 mm nonobstructing LEFT inferior pole renal collecting system calculus. Vascular renal hilar calcifications bilaterally. Both ureters are normal.  Stomach:  Grossly normal.  Small bowel: Submucosal fatty infiltration is present along portions of distal small bowel, a nonspecific finding that can be seen  in inflammatory bowel disease. No stranding, bowel dilation or mesenteric adenopathy to suggest an acute abnormality.  Colon:   Normal appendix.  Diverticulosis without diverticulitis.  Pelvic Genitourinary:  Bladder partially collapsed.  Peritoneum: No free air or free fluid.  Vasculature: Atherosclerosis. Calcified mural plaque in the infrarenal abdominal aorta. No acute vascular abnormality.  Body Wall: Normal.  IMPRESSION: 1. No acute abnormality. 2. Nonobstructing 9 mm LEFT inferior pole renal collecting system calculus. 3. Atherosclerosis.   Electronically Signed   By: Dereck Ligas M.D.   On: 12/07/2014 07:49   Dg Chest 2 View  12/07/2014   CLINICAL DATA:  Dyspnea.  Fall.  EXAM: CHEST  2 VIEW  COMPARISON:  None.  FINDINGS: Normal heart size and aortic contours post CABG. There is no edema, consolidation, effusion, or pneumothorax. No acute osseous findings related to reported fall.  IMPRESSION: No active cardiopulmonary disease.   Electronically Signed   By: Monte Fantasia M.D.   On: 12/07/2014 04:35   US Abdomen Complete  12/07/2014   CLINICAL DATA:  Nausea and vomiting  EXAM: ULTRASOUND ABDOMEN COMPLETE  COMPARISON:  None.  FINDINGS: Gallbladder: No gallstones or wall thickening visualized. No sonographic Murphy sign noted.  Common bile duct: Diameter: 4 mm  Liver: Diffuse coarse and echogenic texture. No focal lesion is seen. Antegrade flow in the imaged portal venous system.  IVC: Not visualized  Pancreas: Not visualized  Spleen: Size and appearance within normal limits.  Right Kidney: Length: 10 cm. Echogenicity within normal limits. No mass or hydronephrosis visualized.  Left Kidney: Length: 10 cm. Echogenicity within normal limits. No mass or hydronephrosis visualized.  Abdominal aorta: Not visualized  IMPRESSION: 1. No explanation for nausea and vomiting. 2. Hepatic steatosis. 3. Nonvisualization of the pancreas, IVC, and aorta due to bowel gas.   Electronically Signed   By: Monte Fantasia  M.D.   On: 12/07/2014 06:12   Medications: I have reviewed the patient's current medications. Prior to Admission:  Prescriptions prior to admission  Medication Sig Dispense Refill Last Dose  . aspirin 81 MG tablet Take 2 pills daily 30 tablet 5 12/05/2014  . lisinopril-hydrochlorothiazide (PRINZIDE) 10-12.5 MG per tablet Take 1 tablet by mouth daily. 30 tablet 5 12/05/2014  . lovastatin (MEVACOR) 20 MG tablet Take 1 tablet (20 mg total) by mouth at bedtime. 30 tablet 11 12/05/2014   Scheduled Meds: . aspirin EC  81 mg Oral Daily  . heparin  5,000 Units Subcutaneous 3 times per day  . insulin aspart  0-15 Units Subcutaneous TID WC  . insulin NPH Human  15 Units Subcutaneous BID AC & HS  . insulin starter kit- syringes  1 kit Other Once   Continuous Infusions:   PRN Meds:. Assessment/Plan: Principal Problem:   DKA (diabetic ketoacidoses) Active Problems:   HTN (hypertension)   CAD (coronary artery disease) of artery bypass graft  History of CVA (cerebrovascular accident)   Diabetes mellitus, new onset   Chronic kidney disease (CKD), stage II (mild)   Hepatic steatosis   Left nephrolithiasis  Anion-Gapped Diabetic Ketoacidosis vs HHS: New diagnosis of T2DM. Patient's gapped closed yesterday and blood sugars trended down. Patient was transitioned off of the insulin gtt and on to NPH 10 units BID and SSI-S. Glucose remains elevated in the 300s.  -HgbA1c pending -Carb modified diet -Increase NPH to 15 units BID -Increase SSI-Moderate -CBG checks AC/HS -Consult to diabetic educator -Insulin starter kit -Consult to dietician  Elevated Troponin: In setting of CAD s/p CABG. Patient is on ASA at home, statin and lisinopril. Troponins have trended down. 0.04>0.05>0.06>0.04>0.05>0.04. Patient denies any chest pain or shortness of breath. EKG showed tachycardia and non-specific TWI in V2 and is without evidence of significant ST elevation or depression. Repeat EKG unchanged from  prior. -Restart ASA 81 mg daily -Restart home lovastatin 20 mg daily on discharge -Hold Lisinopril/HCTZ in setting of AKI  Acute on Chronic Kidney Disease Stage II: Creatinine 2.3>1.73>1.7, up from baseline 1-1.2 (last check in October). AKI may be secondary to dehydration as it slightly improved to 1.3, but then trended back up to 1.7. However, since last check of creatinine in October at 1.2, patient had ACEI increased to 20 mg daily. He was out of it for a while, then it was restarted in February. -[place] -Repeat BMET as outpatient  Hypertension: Patient continues to be normotensive off of medications. Due to AKI likely secondary to ACEI/HCTZ and dehydration and normotension, we will discontinue ACEI/HCTZ. -Discontinue lisinopril/HCTZ in setting of AKI -Follow up blood pressure on discharge -Check BMET on follow up  -Consider BB or very low dose ACEI if AKI resolves  FEN:  -Diet: Carb modified  DVT prophylaxis: Heparin SQ TID  CODE STATUS: FULL CODE -Defer to wife Seth Bake [phone] if patients lacks decision-making capacity -Confirmed with patient on admission  Dispo: Disposition is deferred at this time, awaiting improvement of current medical problems.  Anticipated discharge in approximately 1-2 day(s).   The patient does have a current PCP [name], DO) and does need an [facility] hospital follow-up appointment after discharge.  The patient does not have transportation limitations that hinder transportation to clinic appointments.  .Services Needed at time of discharge: Y = Yes, Blank = No PT:   OT:   RN:   Equipment:   Other:     LOS: 1 day   Osa Craver, DO PGY-1 Internal Medicine Resident Pager # [phone] 12/08/2014 12:20 PM

## 2014-12-09 DIAGNOSIS — I2510 Atherosclerotic heart disease of native coronary artery without angina pectoris: Secondary | ICD-10-CM

## 2014-12-09 DIAGNOSIS — Z8673 Personal history of transient ischemic attack (TIA), and cerebral infarction without residual deficits: Secondary | ICD-10-CM

## 2014-12-09 LAB — BASIC METABOLIC PANEL
ANION GAP: 5 (ref 5–15)
BUN: 31 mg/dL — ABNORMAL HIGH (ref 6–23)
CALCIUM: 8.4 mg/dL (ref 8.4–10.5)
CHLORIDE: 107 mmol/L (ref 96–112)
CO2: 25 mmol/L (ref 19–32)
CREATININE: 1.19 mg/dL (ref 0.50–1.35)
GFR calc Af Amer: 76 mL/min — ABNORMAL LOW (ref >90)
GFR, EST NON AFRICAN AMERICAN: 65 mL/min — AB (ref >90)
Glucose, Bld: 329 mg/dL — ABNORMAL HIGH (ref 70–99)
Potassium: 3.8 mmol/L (ref 3.5–5.1)
Sodium: 137 mmol/L (ref 135–145)

## 2014-12-09 LAB — HEMOGLOBIN A1C
HEMOGLOBIN A1C: 15.8 % — AB (ref 4.8–5.6)
Mean Plasma Glucose: 407 mg/dL

## 2014-12-09 LAB — GLUCOSE, CAPILLARY
GLUCOSE-CAPILLARY: 266 mg/dL — AB (ref 70–99)
GLUCOSE-CAPILLARY: 277 mg/dL — AB (ref 70–99)
GLUCOSE-CAPILLARY: 372 mg/dL — AB (ref 70–99)
GLUCOSE-CAPILLARY: 391 mg/dL — AB (ref 70–99)
Glucose-Capillary: 325 mg/dL — ABNORMAL HIGH (ref 70–99)
Glucose-Capillary: 309 mg/dL — ABNORMAL HIGH (ref 70–99)
Glucose-Capillary: 238 mg/dL — ABNORMAL HIGH (ref 70–99)

## 2014-12-09 MED ORDER — BLOOD GLUC METER DISP-STRIPS DEVI: 1.0000 | Freq: Three times a day (TID) | Status: DC

## 2014-12-09 MED ORDER — LANCETS MISC: 1.0000 | Freq: Three times a day (TID) | Status: DC

## 2014-12-09 MED ORDER — INSULIN NPH ISOPHANE & REGULAR (70-30) 100 UNIT/ML ~~LOC~~ SUSP
30.0000 [IU] | Freq: Two times a day (BID) | SUBCUTANEOUS | Status: DC
Start: 2014-12-09 — End: 2014-12-12

## 2014-12-09 MED ORDER — RELION PRIME MONITOR DEVI
1.0000 | Freq: Three times a day (TID) | Status: DC
Start: 2014-12-09 — End: 2017-02-18

## 2014-12-09 MED ORDER — LIVING WELL WITH DIABETES BOOK
Freq: Once | Status: AC
  Administered 2014-12-09: 16:00:00
  Filled 2014-12-09 (×3): qty 1

## 2014-12-09 MED ORDER — INSULIN NPH (HUMAN) (ISOPHANE) 100 UNIT/ML ~~LOC~~ SUSP
22.0000 [IU] | Freq: Two times a day (BID) | SUBCUTANEOUS | Status: DC
  Administered 2014-12-09 (×2): 22 [IU] via SUBCUTANEOUS
  Filled 2014-12-09 (×3): qty 10

## 2014-12-09 MED ORDER — INSULIN SYRINGES (DISPOSABLE) U-100 0.5 ML MISC
1.0000 | Freq: Two times a day (BID) | Status: DC
Start: 2014-12-09 — End: 2017-02-18

## 2014-12-09 MED ORDER — LIVING WELL WITH DIABETES BOOK: 1.0000 | Freq: Once | Status: DC

## 2014-12-09 NOTE — Progress Notes (Signed)
Subjective:  Patient was seen and examined this morning. Patient denies any complaints. No nausea, vomiting or abdominal pain. He continues to have blurry vision. He injected himself with the NPH last night.   Objective: Vital signs in last 24 hours: Filed Vitals:   12/08/14 0533 12/08/14 1519 12/08/14 2134 12/09/14 0443  BP: 129/72 129/86 139/74 117/69  Pulse: 92 91 71 78  Temp: 99.4 F (37.4 C)  97.8 F (36.6 C) 98.5 F (36.9 C)  TempSrc: Oral  Oral   Resp: $Remo'14 16 18 18  'fQuIK$ Height:      Weight: 179 lb 7.3 oz (81.4 kg)     SpO2: 100% 100% 100% 100%   General: Vital signs reviewed.  Patient is well-developed and well-nourished, in no acute distress and cooperative with exam.  Cardiovascular: RRR Pulmonary/Chest: Clear to auscultation bilaterally, no wheezes, rales, or rhonchi. Abdominal: Soft, non-tender, non-distended, BS + Extremities: No lower extremity edema bilaterally Neurological: A&O x3 Psychiatric: Normal mood and affect. Speech and behavior is normal. Cognition and memory are normal.   Lab Results: Basic Metabolic Panel:  Recent Labs Lab 12/07/14 0238  12/08/14 0557 12/09/14 0840  NA 140  < > 143 137  K 4.6  < > 4.5 3.8  CL 104  < > 110 107  CO2 22  < > 23 25  GLUCOSE 799*  < > 350* 329*  BUN 42*  < > 45* 31*  CREATININE 2.00*  < > 1.77* 1.19  CALCIUM 9.1  < > 8.8 8.4  MG 3.4*  --   --   --   PHOS 4.2  --   --   --   < > = values in this interval not displayed. Liver Function Tests:  Recent Labs Lab 12/06/14 2301 12/07/14 0238  AST 20  --   ALT 29  --   ALKPHOS 103  --   BILITOT 1.3* 1.4*  PROT 7.7  --   ALBUMIN 4.1  --     Recent Labs Lab 12/07/14 0238  LIPASE 82*   CBC:  Recent Labs Lab 12/06/14 2301  WBC 8.0  NEUTROABS 5.7  HGB 16.3  HCT 49.1  MCV 80.8  PLT 303   Cardiac Enzymes:  Recent Labs Lab 12/07/14 2149 12/08/14 0557 12/08/14 1040  TROPONINI 0.04* 0.05* 0.04*   CBG:  Recent Labs Lab 12/08/14 1151  12/08/14 1736 12/08/14 2004 12/09/14 0005 12/09/14 0509 12/09/14 0749  GLUCAP 368* 294* 391* 372* 325* 309*   Urinalysis:  Recent Labs Lab 12/07/14 0112  COLORURINE YELLOW  LABSPEC 1.035*  PHURINE 5.0  GLUCOSEU >1000*  HGBUR NEGATIVE  BILIRUBINUR NEGATIVE  KETONESUR 15*  PROTEINUR NEGATIVE  UROBILINOGEN 0.2  NITRITE NEGATIVE  LEUKOCYTESUR NEGATIVE   Studies/Results: No results found. Medications: I have reviewed the patient's current medications. Prior to Admission:  Prescriptions prior to admission  Medication Sig Dispense Refill Last Dose  . aspirin 81 MG tablet Take 2 pills daily 30 tablet 5 12/05/2014  . lisinopril-hydrochlorothiazide (PRINZIDE) 10-12.5 MG per tablet Take 1 tablet by mouth daily. 30 tablet 5 12/05/2014  . lovastatin (MEVACOR) 20 MG tablet Take 1 tablet (20 mg total) by mouth at bedtime. 30 tablet 11 12/05/2014   Scheduled Meds: . aspirin EC  81 mg Oral Daily  . heparin  5,000 Units Subcutaneous 3 times per day  . insulin aspart  0-15 Units Subcutaneous TID WC  . insulin NPH Human  22 Units Subcutaneous BID AC & HS  . living well  with diabetes book   Does not apply Once   Continuous Infusions:   PRN Meds:. Assessment/Plan: Principal Problem:   DKA (diabetic ketoacidoses) Active Problems:   HTN (hypertension)   CAD (coronary artery disease) of artery bypass graft   History of CVA (cerebrovascular accident)   Diabetes mellitus, new onset   Chronic kidney disease (CKD), stage II (mild)   Hepatic steatosis   Left nephrolithiasis  Anion-Gapped Diabetic Ketoacidosis vs HHS: Resolved. New diagnosis of T2DM with HgbA1c of 15.8. Glucose remains elevated in the 300s on NPH 15 BID and SSI-M. Will increase NPH and await consults today. Likely discharge home later today.  -Carb modified diet -Increase NPH to 22 units BID -SSI-Moderate -CBG checks AC/HS -Consult to diabetic educator -Consult diabetic coordinator -Insulin starter kit -Consult to  dietician -Will discharge on 70/30 30 units BID with follow up with outpatient clinic  CAD s/p CABG: Patient is on ASA at home, statin and lisinopril. No chest pain or shortness of breath. -ASA 81 mg daily -Restart home lovastatin 20 mg daily on discharge  Acute on Chronic Kidney Disease Stage II: Resolved. Creatinine 2.3>1.73>1.3>1.7>>1.7>1.19, baseline 1-1.2 (last check in October). AKI may be secondary to dehydration and ACEI/HCTZ intolerance. -Discontinue Lisinopril/HCTZ -Repeat BMET as outpatient on discharge  Hypertension: Patient continues to be normotensive off of medications. Due to AKI likely secondary to ACEI/HCTZ and dehydration and normotension, we will discontinue ACEI/HCTZ. -Discontinue lisinopril/HCTZ in setting of AKI -Follow up blood pressure on discharge -Consider BB or very low dose ACEI on follow up and trend BMETs  FEN:  -Diet: Carb modified  DVT prophylaxis: Heparin SQ TID  CODE STATUS: FULL CODE -Defer to wife Seth Bake [phone] if patients lacks decision-making capacity -Confirmed with patient on admission  Dispo: Disposition is deferred at this time, awaiting improvement of current medical problems.  Anticipated discharge in approximately 1-2 day(s).   The patient does have a current PCP [name], DO) and does need an [facility] hospital follow-up appointment after discharge.  The patient does not have transportation limitations that hinder transportation to clinic appointments.  .Services Needed at time of discharge: Y = Yes, Blank = No PT:   OT:   RN:   Equipment:   Other:     LOS: 2 days   Osa Craver, DO PGY-1 Internal Medicine Resident Pager # [phone] 12/09/2014 11:57 AM

## 2014-12-09 NOTE — Progress Notes (Signed)
Paged HO Narendra to ask if a consult to diabetic case management and diabetic educator could be put in for the patient due to newly diagnosed diabetes.

## 2014-12-09 NOTE — Discharge Instructions (Signed)
Thank you for allowing [place] to be involved in your healthcare while you were hospitalized at [facility].   Please note that there have been changes to your home medications.  --> PLEASE LOOK AT YOUR DISCHARGE MEDICATION LIST FOR DETAILS.  Please call your PCP if you have any questions or concerns, or any difficulty getting any of your medications.  Please return to the ER if you have worsening of your symptoms or new severe symptoms arise.  FOR YOUR DIABETES:  1. CHECK YOUR BLOOD SUGAR BEFORE BREAKFAST, LUNCH, DINNER, AND BEFORE BEDTIME  2. INJECT YOURSELF WITH 30 UNITS OF INSULIN (IT'S CALLED 70/30 INSULIN) WITH BREAKFAST AND WITH DINNER. MAKE SURE YOU EAT REGULAR MEALS EACH DAY  3. FOLLOW UP IN THE CLINIC ON 12/19/14 AT 2:15 PM AS LISTED IN THIS PACKET  Blood Glucose Monitoring Monitoring your blood glucose (also know as blood sugar) helps you to manage your diabetes. It also helps you and your health care provider monitor your diabetes and determine how well your treatment plan is working. WHY SHOULD YOU MONITOR YOUR BLOOD GLUCOSE?  It can help you understand how food, exercise, and medicine affect your blood glucose.  It allows you to know what your blood glucose is at any given moment. You can quickly tell if you are having low blood glucose (hypoglycemia) or high blood glucose (hyperglycemia).  It can help you and your health care provider know how to adjust your medicines.  It can help you understand how to manage an illness or adjust medicine for exercise. WHEN SHOULD YOU TEST? Your health care provider will help you decide how often you should check your blood glucose. This may depend on the type of diabetes you have, your diabetes control, or the types of medicines you are taking. Be sure to write down all of your blood glucose readings so that this information can be reviewed with your health care provider. See below for examples of testing times that your  health care provider may suggest. Type 1 Diabetes  Test 4 times a day if you are in good control, using an insulin pump, or perform multiple daily injections.  If your diabetes is not well controlled or if you are sick, you may need to monitor more often.  It is a good idea to also monitor:  Before and after exercise.  Between meals and 2 hours after a meal.  Occasionally between 2:00 a.m. and 3:00 a.m. Type 2 Diabetes  It can vary with each person, but generally, if you are on insulin, test 4 times a day.  If you take medicines by mouth (orally), test 2 times a day.  If you are on a controlled diet, test once a day.  If your diabetes is not well controlled or if you are sick, you may need to monitor more often. HOW TO MONITOR YOUR BLOOD GLUCOSE Supplies Needed  Blood glucose meter.  Test strips for your meter. Each meter has its own strips. You must use the strips that go with your own meter.  A pricking needle (lancet).  A device that holds the lancet (lancing device).  A journal or log book to write down your results. Procedure  Wash your hands with soap and water. Alcohol is not preferred.  Prick the side of your finger (not the tip) with the lancet.  Gently milk the finger until a small drop of blood appears.  Follow the instructions that come with your meter for inserting the  test strip, applying blood to the strip, and using your blood glucose meter. Other Areas to Get Blood for Testing Some meters allow you to use other areas of your body (other than your finger) to test your blood. These areas are called alternative sites. The most common alternative sites are:  The forearm.  The thigh.  The back area of the lower leg.  The palm of the hand. The blood flow in these areas is slower. Therefore, the blood glucose values you get may be delayed, and the numbers are different from what you would get from your fingers. Do not use alternative sites if you think  you are having hypoglycemia. Your reading will not be accurate. Always use a finger if you are having hypoglycemia. Also, if you cannot feel your lows (hypoglycemia unawareness), always use your fingers for your blood glucose checks. ADDITIONAL TIPS FOR GLUCOSE MONITORING  Do not reuse lancets.  Always carry your supplies with you.  All blood glucose meters have a 24-hour "hotline" number to call if you have questions or need help.  Adjust (calibrate) your blood glucose meter with a control solution after finishing a few boxes of strips. BLOOD GLUCOSE RECORD KEEPING It is a good idea to keep a daily record or log of your blood glucose readings. Most glucose meters, if not all, keep your glucose records stored in the meter. Some meters come with the ability to download your records to your home computer. Keeping a record of your blood glucose readings is especially helpful if you are wanting to look for patterns. Make notes to go along with the blood glucose readings because you might forget what happened at that exact time. Keeping good records helps you and your health care provider to work together to achieve good diabetes management.  Document Released: 09/23/2003 Document Revised: 02/04/2014 Document Reviewed: 02/12/2013 [facility] Patient Information 2015 [place], [place]. This information is not intended to replace advice given to you by your health care provider. Make sure you discuss any questions you have with your health care provider.

## 2014-12-09 NOTE — Progress Notes (Signed)
NURSING PROGRESS NOTE  Jason Velasquez [id number] Discharge Data: Patient To Be Discharged Tonight At Roughly 2100 per Patient, Night Shift RN confirmed she will right progress note to update official time of d/c. Attending Provider: Aldine Contes, MD JQG:BEEFEOF, Rachel Moulds, DO   [name] to be D/C'd Home per MD order.    All IV's will be discontinued and monitored for bleeding.  All belongings will be returned to patient for patient to take home. All d/c paperwork given to patient, patient verbalizes understanding that he needs to pick up prescriptions from the [facility] he provided listed on his d/c paperwork.  Last Documented Vital Signs:  Blood pressure 123/57, pulse 78, temperature 99.3 F (37.4 C), temperature source Oral, resp. rate 16, height 5\' 10"  (1.778 m), weight 81.4 kg (179 lb 7.3 oz), SpO2 100 %.  Hendricks Limes RN, BS, BSN

## 2014-12-09 NOTE — Progress Notes (Signed)
Diabetic Booklet received from pharmacy patient states he is calling his ride now will keep [place] updated on arrival. He estimates his ride will be here around 5pm.

## 2014-12-09 NOTE — Progress Notes (Signed)
Patient performed self injection of insulin with limited prompting. Joslyn Hy, MSN, RN, [employer]

## 2014-12-09 NOTE — Progress Notes (Addendum)
Inpatient Diabetes Program Recommendations  AACE/ADA: New Consensus Statement on Inpatient Glycemic Control (2013)  Target Ranges:  Prepandial:   less than 140 mg/dL      Peak postprandial:   less than 180 mg/dL (1-2 hours)      Critically ill patients:  140 - 180 mg/dL     Results for Jason Velasquez, BOLEY (MRN [id number]) as of 12/09/2014 11:07  Ref. Range 12/09/2014 00:05 12/09/2014 05:09 12/09/2014 07:49  Glucose-Capillary Latest Range: 70-99 mg/dL 372 (H) 325 (H) 309 (H)    Chief Complaint: Hyperglycemia/ Glucose 968 mg/dl on admission/ New diagnosis of DM  History: HTN, CAD, CABG, CVA  Home DM Meds: None  Current DM Orders: NPH insulin- 22 units bid (increased today)    Novolog Moderate SSI   **Spoke with pt about new diagnosis.  Explained basic pathophysiology of DM Type 2, basic home care, basic diabetes diet nutrition principles, importance of checking CBGs and maintaining good CBG control to prevent long-term and short-term complications.  Reviewed signs and symptoms of hyperglycemia and hypoglycemia and how to treat hypoglycemia at home.  Also reviewed blood sugar goals at home.    **RNs to provide ongoing basic DM education at bedside with this patient.  Have ordered educational booklet, insulin starter kit, and DM videos.  Have also placed RD consult for DM diet education for this patient.  **Patient needs major reinforcement with his learning.  Had many questions about timing of insulin, what kind of insulin, when to eat, etc.  I explained to patient that the MD needs to decide what kind of insulin to send patient home on b/c that will determine the timing of his insulin.  Explained to patient that if he only goes home on NPH insulin that he will only need to take the insulin once with breakfast and once at bedtime but that he will need to check his CBGs often.  Gave patient a blood sugar meter diary and explained to him how to use it.  **Also discussed DM diet information with patient.   Encouraged patient to avoid beverages with sugar (regular soda, sweet tea, lemonade, fruit juice) and to consume mostly water.  Discussed what foods contain carbohydrates and how carbohydrates affect the body's blood sugar levels.  Encouraged patient to be careful with his portion sizes (especially grains, starchy vegetables, and fruits). Explained the plate method with patient using a visual aid and encouraged patient to increase his consumption of non starchy veggies.  **Have RN caring for patient to allow pt to draw up and administer all insulin injections in the hospital.  Have asked the NT to please allow pt to practice checking his own fingerstick glucose levels with the hospital lancets.   12PM Addendum: Spoke with Dr. Marvel Plan by phone.  Discussed the possibility of switching patient to 70/30 insulin bid instead of NPH bid.  Recommend starting patient on 70/30 insulin 30 units bidwc to start.  Patient can get 70/30 insulin from [facility] for $25 per vial.  Also recommend patient buy CBG meter OTC at [facility] as well.  Reli-on Prime blood glucose Meter is $16 and a box of 50 strips is $9.     Will follow Wyn Quaker RN, MSN, CDE Diabetes Coordinator Inpatient Diabetes Program Team Pager: [phone] (8a-10p)

## 2014-12-09 NOTE — Progress Notes (Addendum)
Patient states his ex-wife whom will be picking him up gets off of work at [place], after he talked to his ex-wife's sister. His phone died, and he is waiting on the ex-wife's sister to call his room phone back to give him the ex- wife's phone number so he can get in contact with his ex-wife to arrange for her to pick him up. Currently he states she gets off work at [employer] and will most likely be here at 9pm. This was just conveyed to me by the patient.

## 2014-12-09 NOTE — Plan of Care (Signed)
Problem: Food- and Nutrition-Related Knowledge Deficit (NB-1.1) Goal: Nutrition education Formal process to instruct or train a patient/client in a skill or to impart knowledge to help patients/clients voluntarily manage or modify food choices and eating behavior to maintain or improve health.  Outcome: Adequate for Discharge  RD consulted for nutrition education regarding diabetes.     Lab Results  Component Value Date    HGBA1C 15.8* 12/07/2014   Pt reports concern over diabetes diagnosis, as he "thought it skipped a generation". He reports he is concerned not only for himself, but for his family who is also at risk. Additionally, pt reports concern about being able to care for his diabetes due to lack on insurance and financial difficulties. He reported that DM coordinator educated pt on plate method earlier this AM. Education session was directed towards ideas for low-cost food options to help pt best manage his diabetes.   RD provided "Carbohydrate Counting for People with Diabetes" handout from the Academy of Nutrition and Dietetics. Discussed different food groups and their effects on blood sugar, emphasizing carbohydrate-containing foods. Provided list of carbohydrates and recommended serving sizes of common foods.  Discussed importance of controlled and consistent carbohydrate intake throughout the day. Provided examples of ways to balance meals/snacks and encouraged intake of high-fiber, whole grain complex carbohydrates. Teach back method used.  Expect fair to good compliance.  Body mass index is 25.75 kg/(m^2). Pt meets criteria for overweight based on current BMI.  Current diet order is carb modified, patient is consuming approximately 100% of meals at this time. Labs and medications reviewed. No further nutrition interventions warranted at this time. RD contact information provided. If additional nutrition issues arise, please re-consult RD.  Panzy Bubeck A. Jimmye [name], RD, LDN,  CDE Pager: [phone] After hours Pager: [phone]

## 2014-12-09 NOTE — Progress Notes (Signed)
Patient ready for d/c, still waiting on diabetic booklet from pharmacy, pharmacy messaged.

## 2014-12-09 NOTE — Progress Notes (Signed)
Pt was was given his discharged instructions and voiced understanding had no questions IV were removed  And 22 units of Noviln given as scheduled. Arthor Captain LPN

## 2014-12-10 LAB — GLUTAMIC ACID DECARBOXYLASE AUTO ABS: Glutamic Acid Decarb Ab: <5 U/mL (ref 0.0–5.0)

## 2014-12-12 ENCOUNTER — Ambulatory Visit (INDEPENDENT_AMBULATORY_CARE_PROVIDER_SITE_OTHER): Payer: Self-pay | Admitting: Internal Medicine

## 2014-12-12 ENCOUNTER — Telehealth: Payer: Self-pay | Admitting: *Deleted

## 2014-12-12 VITALS — BP 109/59 | HR 87 | Temp 98.6°F | Wt 190.5 lb

## 2014-12-12 DIAGNOSIS — E1165 Type 2 diabetes mellitus with hyperglycemia: Secondary | ICD-10-CM

## 2014-12-12 DIAGNOSIS — E1122 Type 2 diabetes mellitus with diabetic chronic kidney disease: Secondary | ICD-10-CM

## 2014-12-12 DIAGNOSIS — I10 Essential (primary) hypertension: Secondary | ICD-10-CM

## 2014-12-12 DIAGNOSIS — IMO0002 Reserved for concepts with insufficient information to code with codable children: Secondary | ICD-10-CM

## 2014-12-12 DIAGNOSIS — N182 Chronic kidney disease, stage 2 (mild): Secondary | ICD-10-CM

## 2014-12-12 LAB — GLUCOSE, CAPILLARY: GLUCOSE-CAPILLARY: 343 mg/dL — AB (ref 70–99)

## 2014-12-12 MED ORDER — INSULIN NPH ISOPHANE & REGULAR (70-30) 100 UNIT/ML ~~LOC~~ SUSP: 40.0000 [IU] | Freq: Two times a day (BID) | SUBCUTANEOUS | Status: DC

## 2014-12-12 NOTE — Progress Notes (Signed)
[facility] Subjective:   Patient ID: Bynum Mccullars male   DOB: 1956/08/23 59 y.o.   MRN: [id number]  HPI: Mr.Edenilson Lederman is a 59 y.o. male with a PMH below who presents after recently being discharged from the hospital where he was found to have uncontrolled DM with DKA and AKI.  DM: He was found to have ketone bodies in his blood during the admission and an A1c of 15 up from 6.4 just a few months ago.  He has not gained a significant amount of weight. He was started on 70/30 insulin 30 u BID which he reports he is compliant with and brings his meter which reveals he has continued to be uncontrolled with most readings >300.  HTN: patient has borderline low BP today, per D/C Summary he was instructed to hold ACEi/HCTZ at discharge but resumed it.  He does have some dizziness and generalized weakness.    Past Medical History  Diagnosis Date  . Hypertension   . Hypercholesterolemia   . CAD (coronary artery disease) of artery bypass graft 06/25/2014    CABG- 2007   . History of CVA (cerebrovascular accident) 07/12/2014  . DKA (diabetic ketoacidoses) 12/07/2014   Current Outpatient Prescriptions  Medication Sig Dispense Refill  . aspirin 81 MG tablet Take 2 pills daily (Patient taking differently: Take 81 mg by mouth daily. Take 2 pills daily) 30 tablet 5  . Blood Gluc Meter Disp-Strips (BLOOD GLUCOSE METER DISPOSABLE) DEVI 1 strip by Does not apply route 4 (four) times daily -  before meals and at bedtime. 50 each 11  . Blood Glucose Monitoring Suppl (RELION PRIME MONITOR) DEVI 1 Device by Does not apply route 4 (four) times daily -  with meals and at bedtime. 1 Device 3  . insulin NPH-regular Human (NOVOLIN 70/30 RELION) (70-30) 100 UNIT/ML injection Inject 40 Units into the skin 2 (two) times daily with a meal. 10 mL 11  . Insulin Syringes, Disposable, U-100 0.5 ML MISC 1 applicator by Does not apply route 2 (two) times daily with a meal. 100 each 11  . Lancets MISC 1  Device by Does not apply route 4 (four) times daily -  before meals and at bedtime. 100 each 11  . living well with diabetes book MISC 1 each by Does not apply route once. 1 each 0  . lovastatin (MEVACOR) 20 MG tablet Take 1 tablet (20 mg total) by mouth at bedtime. 30 tablet 11   No current facility-administered medications for this visit.   Family History  Problem Relation Age of Onset  . Hypertension Mother   . Diabetes Father   . Hypertension Sister    History   Social History  . Marital Status: Divorced    Spouse Name: N/A  . Number of Children: N/A  . Years of Education: N/A   Social History Main Topics  . Smoking status: Former Smoker    Quit date: 10/04/2009  . Smokeless tobacco: Not on file  . Alcohol Use: No  . Drug Use: Not on file  . Sexual Activity: Not on file   Other Topics Concern  . Not on file   Social History Narrative   Review of Systems: Review of Systems  Constitutional: Positive for malaise/fatigue. Negative for fever and chills.  Eyes: Negative for blurred vision.  Respiratory: Negative for cough and shortness of breath.   Cardiovascular: Negative for chest pain.  Gastrointestinal: Negative for heartburn and abdominal pain.  Genitourinary: Positive for frequency.  Negative for dysuria.  Musculoskeletal: Negative for back pain.  Neurological: Positive for weakness. Negative for headaches.  Endo/Heme/Allergies: Positive for polydipsia.  Psychiatric/Behavioral: Negative for depression.     Objective:  Physical Exam: Filed Vitals:   12/12/14 1350  BP: 109/59  Pulse: 87  Temp: 98.6 F (37 C)  TempSrc: Oral  Weight: 190 lb 8 oz (86.41 kg)  SpO2: 100%   Physical Exam  Constitutional: No distress.  HENT:  Head: Normocephalic and atraumatic.  Cardiovascular: Normal rate, regular rhythm, normal heart sounds and intact distal pulses.   No murmur heard. 2+ dp and pt bilaterally  Pulmonary/Chest: Effort normal and breath sounds normal.   Abdominal: Soft. Bowel sounds are normal. There is no tenderness.  Musculoskeletal: He exhibits no edema.  Neurological:  Monofilament exam of feet normal bilaterally  Skin: Skin is warm and dry.  Psychiatric: Affect and judgment normal.  Nursing note and vitals reviewed.   Assessment & Plan:  Case discussed with Dr. Lynnae January  Essential hypertension - Borderline BP today, he has resumed his Lisinopril-HCTZ, I suspect he is still mildly dehyrated from his hyperglycemia.   - Hold Lisinopril-HCTZ until follow up visit in 1 week.   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  I suspect he may have some sort of autoimmune or pancreatic problem like a type 1 diabetic.  He did have anti GAD65 testing in the hospital which was negative, however the sensitivity of this test alone is poor.  HE did have a CT of the  - Will test Anti-islet cell ab to increase sensitivity of T1DM testing - If negative also could consider ZnT8 testing -Will increase his 70/30 insulin to 40u BID - Obtain urine microalbumin/Cr - Obtain retinal scan - DM foot exam completed today -FU in 1 week     Medications Ordered Meds ordered this encounter  Medications  . DISCONTD: lisinopril-hydrochlorothiazide (PRINZIDE,ZESTORETIC) 10-12.5 MG per tablet    Sig: Take 1 tablet by mouth daily.  . insulin NPH-regular Human (NOVOLIN 70/30 RELION) (70-30) 100 UNIT/ML injection    Sig: Inject 40 Units into the skin 2 (two) times daily with a meal.    Dispense:  10 mL    Refill:  11   Other Orders Orders Placed This Encounter  Procedures  . Glucose, capillary  . Microalbumin / Creatinine Urine Ratio  . Anti-islet cell antibody  . Ambulatory referral to  diabetic education    Referral Priority:  Routine    Referral Type:  Consultation    Referral Reason:  Specialty Services Required    Referred to Provider:  Chauncey Reading Plyler, RD    Number of Visits Requested:  1  . Retinal/fundus photography    Standing Status: Future     Number of Occurrences:      Standing Expiration Date: 12/12/2015

## 2014-12-12 NOTE — Progress Notes (Signed)
Internal Medicine Clinic Attending  Case discussed with Dr. Hoffman at the time of the visit.  We reviewed the resident's history and exam and pertinent patient test results.  I agree with the assessment, diagnosis, and plan of care documented in the resident's note.  

## 2014-12-12 NOTE — Patient Instructions (Signed)
General Instructions: Please see Marlana Latus on your way out  I want you to increase the insulin to 40u two times a day.  Please continue checking your sugars and do not take your blood pressure medication for now.  Thank you for bringing your medicines today. This helps [place] keep you safe from mistakes.   Progress Toward Treatment Goals:  Treatment Goal 11/28/2014  Blood pressure improved    Self Care Goals & Plans:  Self Care Goal 11/28/2014  Manage my medications take my medicines as prescribed; bring my medications to every visit; refill my medications on time  Eat healthy foods eat foods that are low in salt; eat baked foods instead of fried foods  Be physically active find an activity I enjoy    No flowsheet data found.   Care Management & Community Referrals:  Referral 11/28/2014  Referrals made for care management support none needed

## 2014-12-12 NOTE — Assessment & Plan Note (Signed)
-   Very odd case, he has become a very uncontrolled diabetic very quickly with an essentially normal (6.4) A1c in October to very uncontrolled now (>15) he is only mildly overweight and has not gained a significant amount of weight in that time.  I suspect he may have some sort of autoimmune or pancreatic problem like a type 1 diabetic.  He did have anti GAD65 testing in the hospital which was negative, however the sensitivity of this test alone is poor.  HE did have a CT of the  - Will test Anti-islet cell ab to increase sensitivity of T1DM testing - If negative also could consider ZnT8 testing -Will increase his 70/30 insulin to 40u BID - Obtain urine microalbumin/Cr - Obtain retinal scan - DM foot exam completed today -FU in 1 week

## 2014-12-12 NOTE — Assessment & Plan Note (Signed)
-   Borderline BP today, he has resumed his Lisinopril-HCTZ, I suspect he is still mildly dehyrated from his hyperglycemia.   - Hold Lisinopril-HCTZ until follow up visit in 1 week.

## 2014-12-12 NOTE — Telephone Encounter (Signed)
Pt presented to front desk c/o meter not working and feeling weak and dizzy, placed in 1345 dr Heber [place]

## 2014-12-17 ENCOUNTER — Telehealth: Payer: Self-pay | Admitting: Internal Medicine

## 2014-12-17 NOTE — Telephone Encounter (Signed)
Call to patient to confirm appointment for 12/18/14 at 2:00 busy signal

## 2014-12-18 ENCOUNTER — Telehealth: Payer: Self-pay | Admitting: Internal Medicine

## 2014-12-18 ENCOUNTER — Ambulatory Visit: Payer: Self-pay

## 2014-12-18 NOTE — Telephone Encounter (Signed)
Call to patient to confirm appointment for 12/19/14 at 2:15 phone is bad number

## 2014-12-19 ENCOUNTER — Ambulatory Visit (INDEPENDENT_AMBULATORY_CARE_PROVIDER_SITE_OTHER): Payer: Self-pay | Admitting: Internal Medicine

## 2014-12-19 ENCOUNTER — Encounter: Payer: Self-pay | Admitting: Internal Medicine

## 2014-12-19 ENCOUNTER — Telehealth: Payer: Self-pay | Admitting: Internal Medicine

## 2014-12-19 ENCOUNTER — Ambulatory Visit: Payer: Self-pay | Admitting: Dietician

## 2014-12-19 VITALS — BP 133/75 | HR 72 | Temp 98.4°F | Ht 70.0 in | Wt 198.3 lb

## 2014-12-19 DIAGNOSIS — I129 Hypertensive chronic kidney disease with stage 1 through stage 4 chronic kidney disease, or unspecified chronic kidney disease: Secondary | ICD-10-CM

## 2014-12-19 DIAGNOSIS — I10 Essential (primary) hypertension: Secondary | ICD-10-CM

## 2014-12-19 DIAGNOSIS — IMO0002 Reserved for concepts with insufficient information to code with codable children: Secondary | ICD-10-CM

## 2014-12-19 DIAGNOSIS — N182 Chronic kidney disease, stage 2 (mild): Secondary | ICD-10-CM

## 2014-12-19 DIAGNOSIS — E1165 Type 2 diabetes mellitus with hyperglycemia: Secondary | ICD-10-CM

## 2014-12-19 DIAGNOSIS — E1122 Type 2 diabetes mellitus with diabetic chronic kidney disease: Secondary | ICD-10-CM

## 2014-12-19 LAB — HM DIABETES EYE EXAM

## 2014-12-19 LAB — GLUCOSE, CAPILLARY
GLUCOSE-CAPILLARY: 60 mg/dL — AB (ref 70–99)
GLUCOSE-CAPILLARY: 174 mg/dL — AB (ref 70–99)

## 2014-12-19 MED ORDER — INSULIN ASPART PROT & ASPART (70-30 MIX) 100 UNIT/ML ~~LOC~~ SUSP: SUBCUTANEOUS | Status: DC

## 2014-12-19 NOTE — Assessment & Plan Note (Signed)
Lab Results  Component Value Date   HGBA1C 15.8* 12/07/2014   HGBA1C 6.4 07/12/2014   HGBA1C 6.5 06/25/2014     Assessment: Diabetes control: poor control (HgbA1C >9%) Progress toward A1C goal:  improved Comments: hypoglycemia on 40 u of 70/30 BID  Plan: Medications:  Novlin 70/30 40 u in AM and 30 u in PM Home glucose monitoring: Frequency: 3 times a day Timing: before meals Instruction/counseling given: reminded to get eye exam and discussed diet Educational resources provided:   Self management tools provided:   Other plans: Decrease his PM dose to 30 units for AM hypoglycemia.  He may be recovering from insulin resistance in DKA.  Will check Anti-Islet cell ab and have him get an eye exam.  Close follow up in 2-4 weeks.

## 2014-12-19 NOTE — Progress Notes (Signed)
[facility] Subjective:   Patient ID: Jason Velasquez male   DOB: 06/11/1956 59 y.o.   MRN: [id number]  HPI: Jason Velasquez is a 59 y.o. male with a PMH below who presents after recently being discharged from the hospital where he was found to have uncontrolled DM with DKA and AKI.  I saw him last week as a walk in after his discharge.  He reports he has been taking Novolin 70/30 40u BID (although varies the time he takes his PM dose) he [facility] meter [facility] which he feels is more accurate.  He does bring his meter and download shows that over the past 2 days he has had some lows in talking about this it seems that his lows are in the morning and he is very hyperglycemic at night.  Here in our office his is hypoglycemic at 59 with some mild "gitters" he reports he only had a biscuit all day so far because he was concerned about getting blood work.     Past Medical History  Diagnosis Date  . Hypertension   . Hypercholesterolemia   . CAD (coronary artery disease) of artery bypass graft 06/25/2014    CABG- 2007   . History of CVA (cerebrovascular accident) 07/12/2014  . DKA (diabetic ketoacidoses) 12/07/2014   Current Outpatient Prescriptions  Medication Sig Dispense Refill  . aspirin 81 MG tablet Take 2 pills daily (Patient taking differently: Take 81 mg by mouth daily. Take 2 pills daily) 30 tablet 5  . Blood Gluc Meter Disp-Strips (BLOOD GLUCOSE METER DISPOSABLE) DEVI 1 strip by Does not apply route 4 (four) times daily -  before meals and at bedtime. 50 each 11  . Blood Glucose Monitoring Suppl (RELION PRIME MONITOR) DEVI 1 Device by Does not apply route 4 (four) times daily -  with meals and at bedtime. 1 Device 3  . insulin aspart protamine- aspart (NOVOLOG MIX 70/30) (70-30) 100 UNIT/ML injection Inject 40 units in the morning and 30 units in the evening before meals. 10 mL 2  . Insulin Syringes, Disposable, U-100 0.5 ML MISC 1  applicator by Does not apply route 2 (two) times daily with a meal. 100 each 11  . Lancets MISC 1 Device by Does not apply route 4 (four) times daily -  before meals and at bedtime. 100 each 11  . living well with diabetes book MISC 1 each by Does not apply route once. 1 each 0  . lovastatin (MEVACOR) 20 MG tablet Take 1 tablet (20 mg total) by mouth at bedtime. 30 tablet 11   No current facility-administered medications for this visit.   Family History  Problem Relation Age of Onset  . Hypertension Mother   . Diabetes Father   . Hypertension Sister    History   Social History  . Marital Status: Divorced    Spouse Name: N/A  . Number of Children: N/A  . Years of Education: N/A   Social History Main Topics  . Smoking status: Former Smoker    Quit date: 10/04/2009  . Smokeless tobacco: Not on file  . Alcohol Use: No  . Drug Use: Not on file  . Sexual Activity: Not on file   Other Topics Concern  . None   Social History Narrative   Review of Systems: Review of Systems  Constitutional: Positive for malaise/fatigue. Negative for fever and chills.  Eyes: Negative for blurred vision.  Respiratory: Negative for cough  and shortness of breath.   Cardiovascular: Negative for chest pain.  Gastrointestinal: Negative for abdominal pain.  Genitourinary: Negative for dysuria.  Musculoskeletal: Positive for falls (a couple over the last few weeks).  Skin: Negative for rash.  Neurological: Negative for headaches.  Endo/Heme/Allergies: Negative for polydipsia.     Objective:  Physical Exam: Filed Vitals:   12/19/14 1445  BP: 133/75  Pulse: 72  Temp: 98.4 F (36.9 C)  TempSrc: Oral  Height: 5\' 10"  (1.778 m)  Weight: 198 lb 4.8 oz (89.948 kg)  SpO2: 100%   Physical Exam  Constitutional: No distress.  HENT:  Head: Normocephalic and atraumatic.  Cardiovascular: Normal rate, regular rhythm, normal heart sounds and intact distal pulses.   No murmur heard. Pulmonary/Chest:  Effort normal and breath sounds normal.  Abdominal: Soft. Bowel sounds are normal. There is no tenderness.  Musculoskeletal: He exhibits no edema.  Nursing note and vitals reviewed.   Assessment & Plan:  Case discussed with Dr. 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  He may be recovering from insulin resistance in DKA.  Will check Anti-Islet cell ab and have him get an eye exam.  Close follow up in 2-4 weeks.      Essential hypertension BP Readings from Last 3 Encounters:  12/19/14 133/75  12/12/14 109/59  12/09/14 130/83    Lab Results  Component Value Date   NA 137 12/09/2014   K 3.8 12/09/2014   CREATININE 1.19 12/09/2014    Assessment: Blood pressure control: controlled Progress toward BP goal:  at goal Comments: off medication  Plan: Medications:  none Educational resources provided:   Self management tools provided:   Other plans: normotensive today, continue to hold BP medications      Medications Ordered Meds ordered this encounter  Medications  . insulin aspart protamine- aspart (NOVOLOG MIX 70/30) (70-30) 100 UNIT/ML injection    Sig: Inject 40 units in the morning and 30 units in the evening before meals.    Dispense:  10 mL    Refill:  2   Other Orders Orders Placed This Encounter  Procedures  . Glucose, capillary  .  Glucose, capillary

## 2014-12-19 NOTE — Telephone Encounter (Signed)
Call to patient to confirm appointment for 12/23/14 at 2:00 bad phone number

## 2014-12-19 NOTE — Patient Instructions (Signed)
General Instructions: Please continue to hold the blood pressure medication  Continue taking Novolog 70/30 40 units in the morning but take 30 units at night.  Thank you for bringing your medicines today. This helps [place] keep you safe from mistakes.   Progress Toward Treatment Goals:  Treatment Goal 12/19/2014  Hemoglobin A1C improved  Blood pressure at goal  Prevent falls unchanged    Self Care Goals & Plans:  Self Care Goal 12/19/2014  Manage my medications take my medicines as prescribed; bring my medications to every visit; refill my medications on time  Eat healthy foods eat more vegetables; eat foods that are low in salt; eat baked foods instead of fried foods  Be physically active find an activity I enjoy    Home Blood Glucose Monitoring 12/19/2014  Check my blood sugar 3 times a day  When to check my blood sugar before meals     Care Management & Community Referrals:  Referral 12/19/2014  Referrals made for care management support diabetes educator

## 2014-12-19 NOTE — Assessment & Plan Note (Signed)
BP Readings from Last 3 Encounters:  12/19/14 133/75  12/12/14 109/59  12/09/14 130/83    Lab Results  Component Value Date   NA 137 12/09/2014   K 3.8 12/09/2014   CREATININE 1.19 12/09/2014    Assessment: Blood pressure control: controlled Progress toward BP goal:  at goal Comments: off medication  Plan: Medications:  none Educational resources provided:   Self management tools provided:   Other plans: normotensive today, continue to hold BP medications

## 2014-12-20 LAB — MICROALBUMIN / CREATININE URINE RATIO
Creatinine, Urine: 86.1 mg/dL
MICROALB UR: 0.5 mg/dL (ref <2.0)
MICROALB/CREAT RATIO: 5.8 mg/g (ref 0.0–30.0)

## 2014-12-23 ENCOUNTER — Ambulatory Visit: Payer: Self-pay

## 2014-12-23 ENCOUNTER — Encounter: Payer: Self-pay | Admitting: *Deleted

## 2014-12-24 LAB — ANTI-ISLET CELL ANTIBODY: Pancreatic Islet Cell Antibody: <5 JDF Units (ref <5)

## 2014-12-24 NOTE — Progress Notes (Signed)
Case discussed with Dr. Hoffman at time of visit. We reviewed the resident's history and exam and pertinent patient test results. I agree with the assessment, diagnosis, and plan of care documented in the resident's note. 

## 2014-12-26 ENCOUNTER — Other Ambulatory Visit: Payer: Self-pay

## 2014-12-26 ENCOUNTER — Other Ambulatory Visit: Payer: Self-pay | Admitting: Internal Medicine

## 2014-12-26 ENCOUNTER — Telehealth: Payer: Self-pay | Admitting: *Deleted

## 2014-12-26 MED ORDER — ROSUVASTATIN CALCIUM 20 MG PO TABS: 20.0000 mg | ORAL_TABLET | Freq: Every day | ORAL | Status: DC

## 2014-12-26 NOTE — Telephone Encounter (Signed)
[facility] called about insulin - pt had been on Novolin 70/30 and Rx was written for Novolog 70/30. Talked with Dr Heber [place] - depends on which insulin pharmacy carrys - either Novolin or Novolog - has to be 70/30. Also called in Crestor 20mg  #30 with 11 refills  for Dr Heber [place] at [facility]. Hilda Blades Odessia Asleson RN 12/26/14 1:20PM

## 2015-01-03 ENCOUNTER — Telehealth: Payer: Self-pay | Admitting: *Deleted

## 2015-01-03 NOTE — Telephone Encounter (Signed)
Patient has a history of severe CAD s/p CABG he ideally should be on a high intensity statin such as Crestor 20mg  daily, we had reduced him to Lovastatin 20mg  for cost purposes however he was at goal LDL with Lovastatin 40mg  daily.  I would recommend that he continue Crestor 20mg  or if not available he could do Crestor 10mg  daily.

## 2015-01-03 NOTE — Telephone Encounter (Signed)
Pharmacist at [facility] calls to question new script for crestor, pt has been on lovastatin 20mg  but pharm can no longer provide that so they ask for a change to crestor, it was changed to 20mg  crestor from 20mg  lovastatin, crestor it is much stronger, do you want 20 mg or would 5 or 10mg  be good?

## 2015-01-03 NOTE — Telephone Encounter (Signed)
Verified w/ pharm, will do 20mg  crestor

## 2015-01-07 ENCOUNTER — Ambulatory Visit (INDEPENDENT_AMBULATORY_CARE_PROVIDER_SITE_OTHER): Payer: Self-pay | Admitting: Internal Medicine

## 2015-01-07 ENCOUNTER — Telehealth: Payer: Self-pay | Admitting: *Deleted

## 2015-01-07 VITALS — BP 155/97 | HR 82 | Temp 98.7°F | Ht 70.0 in | Wt 201.4 lb

## 2015-01-07 DIAGNOSIS — E1165 Type 2 diabetes mellitus with hyperglycemia: Secondary | ICD-10-CM

## 2015-01-07 DIAGNOSIS — E1122 Type 2 diabetes mellitus with diabetic chronic kidney disease: Secondary | ICD-10-CM

## 2015-01-07 DIAGNOSIS — IMO0002 Reserved for concepts with insufficient information to code with codable children: Secondary | ICD-10-CM

## 2015-01-07 DIAGNOSIS — N182 Chronic kidney disease, stage 2 (mild): Secondary | ICD-10-CM

## 2015-01-07 DIAGNOSIS — I10 Essential (primary) hypertension: Secondary | ICD-10-CM

## 2015-01-07 LAB — BASIC METABOLIC PANEL WITH GFR
BUN: 7 mg/dL (ref 6–23)
CHLORIDE: 104 meq/L (ref 96–112)
CO2: 24 mEq/L (ref 19–32)
Calcium: 8.7 mg/dL (ref 8.4–10.5)
Creat: 0.92 mg/dL (ref 0.50–1.35)
GFR, Est African American: >89 mL/min
Glucose, Bld: 124 mg/dL — ABNORMAL HIGH (ref 70–99)
POTASSIUM: 3.9 meq/L (ref 3.5–5.3)
SODIUM: 138 meq/L (ref 135–145)

## 2015-01-07 NOTE — Assessment & Plan Note (Signed)
Lab Results  Component Value Date   HGBA1C 15.8* 12/07/2014   HGBA1C 6.4 07/12/2014   HGBA1C 6.5 06/25/2014     Assessment: Diabetes control: poor control (HgbA1C >9%) Progress toward A1C goal:  improved Comments: glucose control improved per report  Plan: Medications:  Novolin 70/30 40u in AM and 30u in PM Home glucose monitoring: Frequency: 2 times a day Timing: before meals Instruction/counseling given: reminded to get eye exam, reminded to bring blood glucose meter & log to each visit and reminded to bring medications to each visit Educational resources provided:   Self management tools provided:   Other plans: none

## 2015-01-07 NOTE — Progress Notes (Signed)
[facility] Subjective:   Patient ID: Jason Velasquez male   DOB: Mar 27, 1956 59 y.o.   MRN: [id number]  HPI: [name] is a 59 y.o. male with a PMH detailed below who presents to clinic today with concern about elevated blood pressures at home.  He has a previous history of HTN but his BP medication (lisinopril-hctz) was discontinued at his hospitalization for DKA due to AKI and normotension.  HTN: Recently started using a home blood pressure cuff and reports readings of >160 which made him concerned to come in.  He reports an occasional mild headache.  DM: He has taking Novolin 70/30 40u in AM and 30 u in PM.  He does not bring his meter in today but reports his sugars have been much better ranging from 80s-150s.  He notes that he in general is feeling better and that his blurry vision has improved some lately.  Past Medical History  Diagnosis Date  . Hypertension   . Hypercholesterolemia   . CAD (coronary artery disease) of artery bypass graft 06/25/2014    CABG- 2007   . History of CVA (cerebrovascular accident) 07/12/2014  . DKA (diabetic ketoacidoses) 12/07/2014   Current Outpatient Prescriptions  Medication Sig Dispense Refill  . aspirin 81 MG tablet Take 2 pills daily (Patient taking differently: Take 81 mg by mouth daily. Take 2 pills daily) 30 tablet 5  . Blood Gluc Meter Disp-Strips (BLOOD GLUCOSE METER DISPOSABLE) DEVI 1 strip by Does not apply route 4 (four) times daily -  before meals and at bedtime. 50 each 11  . Blood Glucose Monitoring Suppl (RELION PRIME MONITOR) DEVI 1 Device by Does not apply route 4 (four) times daily -  with meals and at bedtime. 1 Device 3  . insulin aspart protamine- aspart (NOVOLOG MIX 70/30) (70-30) 100 UNIT/ML injection Inject 40 units in the morning and 30 units in the evening before meals. 10 mL 2  . Insulin Syringes, Disposable, U-100 0.5 ML MISC 1 applicator by Does not apply route 2 (two) times daily with a meal. 100  each 11  . Lancets MISC 1 Device by Does not apply route 4 (four) times daily -  before meals and at bedtime. 100 each 11  . lisinopril-hydrochlorothiazide (PRINZIDE,ZESTORETIC) 10-12.5 MG per tablet Take 1 tablet by mouth daily.    . rosuvastatin (CRESTOR) 20 MG tablet Take 1 tablet (20 mg total) by mouth at bedtime. (Patient not taking: Reported on 01/07/2015) 30 tablet 11   No current facility-administered medications for this visit.   Family History  Problem Relation Age of Onset  . Hypertension Mother   . Diabetes Father   . Hypertension Sister    History   Social History  . Marital Status: Divorced    Spouse Name: N/A  . Number of Children: N/A  . Years of Education: 13   Occupational History  .  Unemployed   Social History Main Topics  . Smoking status: Former Smoker    Quit date: 10/04/2009  . Smokeless tobacco: Not on file  . Alcohol Use: No  . Drug Use: Not on file  . Sexual Activity: Not on file   Other Topics Concern  . Not on file   Social History Narrative   Review of Systems: Review of Systems  Constitutional: Negative for fever and malaise/fatigue.  HENT: Negative for tinnitus.   Eyes: Positive for blurred vision (improved). Negative for double vision.  Respiratory: Negative for cough and shortness of breath.  Cardiovascular: Negative for chest pain, palpitations and leg swelling.  Gastrointestinal: Negative for abdominal pain.  Genitourinary: Negative for dysuria.  Musculoskeletal: Negative for myalgias.  Neurological: Positive for headaches.  Endo/Heme/Allergies: Negative for polydipsia.     Objective:  Physical Exam: Filed Vitals:   01/07/15 1154  BP: 155/97  Pulse: 82  Temp: 98.7 F (37.1 C)  TempSrc: Oral  Height: 5\' 10"  (1.778 m)  Weight: 201 lb 6.4 oz (91.354 kg)  SpO2: 100%   Physical Exam  Constitutional: He is well-developed, well-nourished, and in no distress.  HENT:  Head: Normocephalic and atraumatic.  Eyes: Conjunctivae  are normal.  Cardiovascular: Normal rate, regular rhythm and normal heart sounds.   No murmur heard. Pulmonary/Chest: Effort normal and breath sounds normal. No respiratory distress. He has no wheezes.  Abdominal: Soft. Bowel sounds are normal.  Musculoskeletal: He exhibits no edema.  Skin: Skin is warm and dry.  Nursing note and vitals reviewed.    Assessment & Plan:  Case discussed with Dr. Eppie Gibson  Essential hypertension BP Readings from Last 3 Encounters:  01/07/15 155/97  12/19/14 133/75  12/12/14 109/59    Lab Results  Component Value Date   NA 137 12/09/2014   K 3.8 12/09/2014   CREATININE 1.19 12/09/2014    Assessment: Blood pressure control: moderately elevated Progress toward BP goal:  deteriorated Comments:   Plan: Medications:  Will have patient resume lisinopril HCTZ 10-12.5mg  daily Educational resources provided:   Self management tools provided:   Other plans: Check BMP for renal function and potassium today, patient to return in 3-4 weeks for repeat BMP and return visit in 2 months.    Uncontrolled diabetes mellitus with stage 2 chronic kidney disease Lab Results  Component Value Date   HGBA1C 15.8* 12/07/2014   HGBA1C 6.4 07/12/2014   HGBA1C 6.5 06/25/2014     Assessment: Diabetes control: poor control (HgbA1C >9%) Progress toward A1C goal:  improved Comments: glucose control improved per report  Plan: Medications:  Novolin 70/30 40u in AM and 30u in PM Home glucose monitoring: Frequency: 2 times a day Timing: before meals Instruction/counseling given: reminded to get eye exam, reminded to bring blood glucose meter & log to each visit and reminded to bring medications to each visit Educational resources provided:   Self management tools provided:   Other plans: none      Chronic kidney disease (CKD), stage II (mild) - Resuming lisinopril and monitoring renal function.     Medications Ordered Meds ordered this encounter   Medications  . lisinopril-hydrochlorothiazide (PRINZIDE,ZESTORETIC) 10-12.5 MG per tablet    Sig: Take 1 tablet by mouth daily.   Other Orders Orders Placed This Encounter  Procedures  . BMP with Estimated GFR (CPT-80048)  . BMP with Estimated GFR ([id number])    Standing Status: Future     Number of Occurrences:      Standing Expiration Date: 01/07/2016

## 2015-01-07 NOTE — Patient Instructions (Signed)
General Instructions:  I want you to restart your blood pressure medication, call me if you feel weak like you are going to pass out or have very low blood pressures.   Please bring your medicines with you each time you come to clinic.  Medicines may include prescription medications, over-the-counter medications, herbal remedies, eye drops, vitamins, or other pills.   Progress Toward Treatment Goals:  Treatment Goal 12/19/2014  Hemoglobin A1C improved  Blood pressure at goal  Prevent falls unchanged    Self Care Goals & Plans:  Self Care Goal 12/19/2014  Manage my medications take my medicines as prescribed; bring my medications to every visit; refill my medications on time  Eat healthy foods eat more vegetables; eat foods that are low in salt; eat baked foods instead of fried foods  Be physically active find an activity I enjoy    Home Blood Glucose Monitoring 12/19/2014  Check my blood sugar 3 times a day  When to check my blood sugar before meals     Care Management & Community Referrals:  Referral 12/19/2014  Referrals made for care management support diabetes educator

## 2015-01-07 NOTE — Telephone Encounter (Signed)
Walk in, states BP up from use of a BP machine at home, hasnt been used in a while, not been calibrated recently, spoke to dr Heber Centuria, he will see the pt

## 2015-01-07 NOTE — Assessment & Plan Note (Signed)
BP Readings from Last 3 Encounters:  01/07/15 155/97  12/19/14 133/75  12/12/14 109/59    Lab Results  Component Value Date   NA 137 12/09/2014   K 3.8 12/09/2014   CREATININE 1.19 12/09/2014    Assessment: Blood pressure control: moderately elevated Progress toward BP goal:  deteriorated Comments:   Plan: Medications:  Will have patient resume lisinopril HCTZ 10-12.5mg  daily Educational resources provided:   Self management tools provided:   Other plans: Check BMP for renal function and potassium today, patient to return in 3-4 weeks for repeat BMP and return visit in 2 months.

## 2015-01-07 NOTE — Assessment & Plan Note (Signed)
-   Resuming lisinopril and monitoring renal function.

## 2015-01-08 NOTE — Progress Notes (Signed)
Case discussed with Dr. Hoffman soon after the resident saw the patient.  We reviewed the resident's history and exam and pertinent patient test results.  I agree with the assessment, diagnosis, and plan of care documented in the resident's note. 

## 2015-01-10 MED ORDER — INSULIN ASPART PROT & ASPART (70-30 MIX) 100 UNIT/ML ~~LOC~~ SUSP: SUBCUTANEOUS | Status: DC

## 2015-01-10 NOTE — Addendum Note (Signed)
Addended by: Joni Reining C on: 01/10/2015 04:55 PM   Modules accepted: Orders

## 2015-02-06 LAB — HM DIABETES EYE EXAM

## 2015-02-12 ENCOUNTER — Encounter: Payer: Self-pay | Admitting: *Deleted

## 2015-02-26 ENCOUNTER — Telehealth: Payer: Self-pay | Admitting: Internal Medicine

## 2015-02-26 NOTE — Telephone Encounter (Signed)
Call to patient to confirm appointment for 02/27/15 at 2:15 and 3:30 phone is disconnected

## 2015-02-27 ENCOUNTER — Ambulatory Visit (INDEPENDENT_AMBULATORY_CARE_PROVIDER_SITE_OTHER): Payer: Self-pay | Admitting: Dietician

## 2015-02-27 ENCOUNTER — Encounter: Payer: Self-pay | Admitting: Internal Medicine

## 2015-02-27 ENCOUNTER — Other Ambulatory Visit: Payer: Self-pay | Admitting: Internal Medicine

## 2015-02-27 ENCOUNTER — Ambulatory Visit (INDEPENDENT_AMBULATORY_CARE_PROVIDER_SITE_OTHER): Payer: Self-pay | Admitting: Internal Medicine

## 2015-02-27 VITALS — BP 129/78 | HR 96 | Temp 98.2°F | Ht 70.0 in | Wt 210.0 lb

## 2015-02-27 DIAGNOSIS — Z0000 Encounter for general adult medical examination without abnormal findings: Secondary | ICD-10-CM

## 2015-02-27 DIAGNOSIS — Z79899 Other long term (current) drug therapy: Secondary | ICD-10-CM

## 2015-02-27 DIAGNOSIS — I2510 Atherosclerotic heart disease of native coronary artery without angina pectoris: Secondary | ICD-10-CM

## 2015-02-27 DIAGNOSIS — I10 Essential (primary) hypertension: Secondary | ICD-10-CM

## 2015-02-27 DIAGNOSIS — N182 Chronic kidney disease, stage 2 (mild): Secondary | ICD-10-CM

## 2015-02-27 DIAGNOSIS — Z713 Dietary counseling and surveillance: Secondary | ICD-10-CM

## 2015-02-27 DIAGNOSIS — IMO0002 Reserved for concepts with insufficient information to code with codable children: Secondary | ICD-10-CM

## 2015-02-27 DIAGNOSIS — E1122 Type 2 diabetes mellitus with diabetic chronic kidney disease: Secondary | ICD-10-CM

## 2015-02-27 DIAGNOSIS — I129 Hypertensive chronic kidney disease with stage 1 through stage 4 chronic kidney disease, or unspecified chronic kidney disease: Secondary | ICD-10-CM

## 2015-02-27 DIAGNOSIS — Z794 Long term (current) use of insulin: Secondary | ICD-10-CM

## 2015-02-27 DIAGNOSIS — E1165 Type 2 diabetes mellitus with hyperglycemia: Secondary | ICD-10-CM

## 2015-02-27 DIAGNOSIS — Z7982 Long term (current) use of aspirin: Secondary | ICD-10-CM

## 2015-02-27 DIAGNOSIS — E119 Type 2 diabetes mellitus without complications: Secondary | ICD-10-CM

## 2015-02-27 DIAGNOSIS — I25708 Atherosclerosis of coronary artery bypass graft(s), unspecified, with other forms of angina pectoris: Secondary | ICD-10-CM

## 2015-02-27 LAB — GLUCOSE, CAPILLARY: Glucose-Capillary: 106 mg/dL — ABNORMAL HIGH (ref 65–99)

## 2015-02-27 MED ORDER — INSULIN ASPART PROT & ASPART (70-30 MIX) 100 UNIT/ML ~~LOC~~ SUSP: SUBCUTANEOUS | Status: DC

## 2015-02-27 MED ORDER — METFORMIN HCL 500 MG PO TABS: 500.0000 mg | ORAL_TABLET | Freq: Two times a day (BID) | ORAL | Status: DC

## 2015-02-27 NOTE — Patient Instructions (Signed)
Please make an appointment for 2 weeks.    Be sure to eat a small snack about 2-3 PM and bedtime  You can have low blood sugars if the metformin helps your body use your insulin better.  Keep on the lookout and always carry candy in your pocket.  Call if you have questions- you can leave a message on my machine any time of day.   Butch Penny [phone]

## 2015-02-27 NOTE — Progress Notes (Signed)
[facility] Subjective:   Patient ID: Jason Velasquez male   DOB: May 07, 1956 59 y.o.   MRN: [id number]  HPI: [name] is a 59 y.o. male with a PMH detailed below who presents for follow up of HTN and DM  DM: he has been taking novolog 70/30, 40 units in AM and 30 in PM, reports no issues, he brings his meter, review shows some mild lows (high 60s) in the afternoon with at goal am sugars.  HTN: reports he is doing well with BP meds.    Past Medical History  Diagnosis Date  . Hypertension   . Hypercholesterolemia   . CAD (coronary artery disease) of artery bypass graft 06/25/2014    CABG- 2007   . History of CVA (cerebrovascular accident) 07/12/2014  . DKA (diabetic ketoacidoses) 12/07/2014   Current Outpatient Prescriptions  Medication Sig Dispense Refill  . aspirin 81 MG tablet Take 2 pills daily (Patient taking differently: Take 81 mg by mouth daily. Take 2 pills daily) 30 tablet 5  . insulin aspart protamine- aspart (NOVOLOG MIX 70/30) (70-30) 100 UNIT/ML injection Inject 30 units in the morning and 30 units in the evening before meals. 10 mL 5  . lisinopril-hydrochlorothiazide (PRINZIDE,ZESTORETIC) 10-12.5 MG per tablet Take 1 tablet by mouth daily.    . rosuvastatin (CRESTOR) 20 MG tablet Take 1 tablet (20 mg total) by mouth at bedtime. 30 tablet 11  . Blood Gluc Meter Disp-Strips (BLOOD GLUCOSE METER DISPOSABLE) DEVI 1 strip by Does not apply route 4 (four) times daily -  before meals and at bedtime. 50 each 11  . Blood Glucose Monitoring Suppl (RELION PRIME MONITOR) DEVI 1 Device by Does not apply route 4 (four) times daily -  with meals and at bedtime. 1 Device 3  . Insulin Syringes, Disposable, U-100 0.5 ML MISC 1 applicator by Does not apply route 2 (two) times daily with a meal. 100 each 11  . Lancets MISC 1 Device by Does not apply route 4 (four) times daily -  before meals and at bedtime. 100 each 11  . metFORMIN (GLUCOPHAGE) 500 MG tablet Take 1  tablet (500 mg total) by mouth 2 (two) times daily with a meal. (Patient not taking: Reported on 02/28/2015) 60 tablet 11   No current facility-administered medications for this visit.   Family History  Problem Relation Age of Onset  . Hypertension Mother   . Diabetes Father   . Hypertension Sister    History   Social History  . Marital Status: Divorced    Spouse Name: N/A  . Number of Children: N/A  . Years of Education: 13   Occupational History  .  Unemployed   Social History Main Topics  . Smoking status: Former Smoker    Quit date: 10/04/2009  . Smokeless tobacco: Not on file  . Alcohol Use: No  . Drug Use: Not on file  . Sexual Activity: Not on file   Other Topics Concern  . None   Social History Narrative   Review of Systems: Review of Systems  Constitutional: Negative for fever, chills, weight loss and malaise/fatigue.  Eyes: Negative for blurred vision.  Respiratory: Negative for cough and shortness of breath.   Cardiovascular: Negative for chest pain and leg swelling.  Gastrointestinal: Negative for heartburn.  Genitourinary: Negative for dysuria and frequency.  Neurological: Negative for headaches.  Endo/Heme/Allergies: Negative for polydipsia.  Psychiatric/Behavioral: Negative for depression.     Objective:  Physical Exam: Filed Vitals:  02/27/15 1446  BP: 129/78  Pulse: 96  Temp: 98.2 F (36.8 C)  TempSrc: Oral  Height: 5\' 10"  (1.778 m)  Weight: 210 lb (95.255 kg)  SpO2: 100%  Physical Exam  Constitutional: He is well-developed, well-nourished, and in no distress.  HENT:  Head: Normocephalic and atraumatic.  Cardiovascular: Normal rate, regular rhythm, normal heart sounds and intact distal pulses.   No murmur heard. Pulmonary/Chest: Effort normal and breath sounds normal.  Abdominal: Soft. Bowel sounds are normal.  Musculoskeletal: He exhibits no edema.  Skin:  Feet visually inspected, no lesions.  Nursing note and vitals reviewed.    Assessment & Plan:  Case discussed with Dr. Eppie Gibson  Uncontrolled diabetes mellitus with stage 2 chronic kidney disease Lab Results  Component Value Date   HGBA1C 15.8* 12/07/2014   HGBA1C 6.4 07/12/2014   HGBA1C 6.5 06/25/2014     Assessment: Diabetes control: poor control (HgbA1C >9%) Progress toward A1C goal:  improved Comments: Great improvement  Plan: Medications:  Decrease insulin to Novolog 70/30 30u BID, will add metformin 500mg  BID Home glucose monitoring: Frequency: 2 times a day Timing:   Instruction/counseling given: discussed the need for weight loss Educational resources provided:   Self management tools provided:   Other plans: weight gain with insulin will add metformin and decrease insulin some. Return in 1 month for A1c.      Preventative health care Has never had colonoscopy will place referral for screening colonoscopy.   CAD (coronary artery disease) of artery bypass graft No chest pain Now on much more ideal medical regimen thanks to [employer] med assist. Continue ASA, Lisinioril-HCTZ, Crestor 20mg  daily, can add BB if needed in future.   Essential hypertension BP Readings from Last 3 Encounters:  02/27/15 129/78  01/07/15 155/97  12/19/14 133/75    Lab Results  Component Value Date   NA 138 01/07/2015   K 3.9 01/07/2015   CREATININE 0.92 01/07/2015    Assessment: Blood pressure control: controlled Progress toward BP goal:  at goal Comments:   Plan: Medications:  Lisinopril HCTZ 10-12.5 mg daily Educational resources provided:   Self management tools provided:   Other plans:       Medications Ordered Meds ordered this encounter  Medications  . metFORMIN (GLUCOPHAGE) 500 MG tablet    Sig: Take 1 tablet (500 mg total) by mouth 2 (two) times daily with a meal.    Dispense:  60 tablet    Refill:  11  . insulin aspart protamine- aspart (NOVOLOG MIX 70/30) (70-30) 100 UNIT/ML injection    Sig: Inject 30 units in the  morning and 30 units in the evening before meals.    Dispense:  10 mL    Refill:  5   Other Orders Orders Placed This Encounter  Procedures  . Ambulatory referral to Gastroenterology    Referral Priority:  Routine    Referral Type:  Consultation    Referral Reason:  Specialty Services Required    Requested Specialty:  Gastroenterology    Number of Visits Requested:  1

## 2015-02-27 NOTE — Patient Instructions (Addendum)
I am starting you on metformin take 500mg  once a day for a week then start taking twice a day with meals.  Decrease your insulin to 30units twice a day.  General Instructions:   Thank you for bringing your medicines today. This helps [place] keep you safe from mistakes.   Progress Toward Treatment Goals:  Treatment Goal 02/27/2015  Hemoglobin A1C improved  Blood pressure at goal  Prevent falls -    Self Care Goals & Plans:  Self Care Goal 02/27/2015  Manage my medications take my medicines as prescribed; bring my medications to every visit; refill my medications on time  Eat healthy foods eat more vegetables; eat foods that are low in salt; eat baked foods instead of fried foods  Be physically active take a walk every day    Home Blood Glucose Monitoring 02/27/2015  Check my blood sugar 2 times a day  When to check my blood sugar -     Care Management & Community Referrals:  Referral 02/27/2015  Referrals made for care management support diabetes educator

## 2015-02-28 ENCOUNTER — Encounter: Payer: Self-pay | Admitting: Internal Medicine

## 2015-02-28 NOTE — Assessment & Plan Note (Signed)
Lab Results  Component Value Date   HGBA1C 15.8* 12/07/2014   HGBA1C 6.4 07/12/2014   HGBA1C 6.5 06/25/2014     Assessment: Diabetes control: poor control (HgbA1C >9%) Progress toward A1C goal:  improved Comments: Great improvement  Plan: Medications:  Decrease insulin to Novolog 70/30 30u BID, will add metformin 500mg  BID Home glucose monitoring: Frequency: 2 times a day Timing:   Instruction/counseling given: discussed the need for weight loss Educational resources provided:   Self management tools provided:   Other plans: weight gain with insulin will add metformin and decrease insulin some. Return in 1 month for A1c.

## 2015-02-28 NOTE — Progress Notes (Signed)
Diabetes Self-Management Education  Visit Type: First/Initial Appt. Start Time: 1500 Appt. End Time: 1600 02/28/2015  Jason Velasquez, identified by name and date of birth, is a 59 y.o. male with a diagnosis of Diabetes: Type 2.        ASSESSMENT  Has gained 9# since last visit, He is not comfortable with this weight gain.  Initial Visit Information:  Are you currently following a meal plan?: Yes What type of meal plan do you follow?:  (balanced meals, decreased starch and sweets) Are you taking your medications as prescribed?: Yes     How often do you need to have someone help you when you read instructions, pamphlets, or other written materials from your doctor or pharmacy?: 2 - Rarely What is the last grade level you completed in school?: 12  Psychosocial:   Patient Belief/Attitude about Diabetes: Motivated to manage diabetes Self-care barriers: Lack of transportation, Lack of material resources Self-management support: Doctor's office, Friends, CDE visits Patient Concerns: Nutrition/Meal planning, Medication, Monitoring, Glycemic Control, Weight Control Special Needs: None Preferred Learning Style: No preference indicated Learning Readiness: Ready  Complications:   How often do you check your blood sugar?: 1-2 times/day Fasting Blood glucose range (mg/dL): 70-129, 130-179 Postprandial Blood glucose range (mg/dL): 130-179, 180-200, >200 Number of hypoglycemic episodes per month: 2 Can you tell when your blood sugar is low?: Yes What do you do if your blood sugar is low?: eat candy or drink juice Number of hyperglycemic episodes per week:  (2) Have you had a dilated eye exam in the past 12 months?: Yes Have you had a dental exam in the past 12 months?: No  Diet Intake:    he eats three meals a day, would like to reduce his weight, trying to decrease portions of starches, sugars and eat more vegetables, no fried foods  Exercise:  Exercise: Light (walking / raking  leaves)  Individualized Plan for Diabetes Self-Management Training:   Learning Objective:  Patient will have a greater understanding of diabetes self-management.  Patient education plan per assessed needs and concerns is to attend individual sessions for What is diabetes    Meal planning Monitoring Exercise Medications Acute and long term complications   Education Topics Reviewed with Patient Today:    Role of diet in the treatment of diabetes and the relationship between the three main macronutrients and blood glucose level Role of exercise on diabetes management, blood pressure control and cardiac health. Reviewed patients medication for diabetes, action, purpose, timing of dose and side effects. Identified appropriate SMBG and/or A1C goals. Taught treatment of hypoglycemia - the 15 rule.          PATIENTS GOALS/Plan (Developed by the patient):  Reducing Risk: treat hypoglycemia with 15 grams of carbs if blood glucose less than 70mg /dL  Plan:   Patient Instructions  Please make an appointment for 2 weeks.    Be sure to eat a small snack about 2-3 PM and bedtime  You can have low blood sugars if the metformin helps your body use your insulin better.  Keep on the lookout and always carry candy in your pocket.  Call if you have questions- you can leave a message on my machine any time of day.   Butch Penny [phone]   Expected Outcomes:  Demonstrated interest in learning. Expect positive outcomes Education material provided: My Plate If problems or questions, patient to contact team via:  Phone Future DSME appointment: 2 wks

## 2015-02-28 NOTE — Assessment & Plan Note (Signed)
BP Readings from Last 3 Encounters:  02/27/15 129/78  01/07/15 155/97  12/19/14 133/75    Lab Results  Component Value Date   NA 138 01/07/2015   K 3.9 01/07/2015   CREATININE 0.92 01/07/2015    Assessment: Blood pressure control: controlled Progress toward BP goal:  at goal Comments:   Plan: Medications:  Lisinopril HCTZ 10-12.5 mg daily Educational resources provided:   Self management tools provided:   Other plans:

## 2015-02-28 NOTE — Assessment & Plan Note (Signed)
Has never had colonoscopy will place referral for screening colonoscopy.

## 2015-02-28 NOTE — Assessment & Plan Note (Signed)
No chest pain Now on much more ideal medical regimen thanks to [employer] med assist. Continue ASA, Lisinioril-HCTZ, Crestor 20mg  daily, can add BB if needed in future.

## 2015-03-04 NOTE — Progress Notes (Signed)
Case discussed with Dr. Hoffman at time of visit. We reviewed the resident's history and exam and pertinent patient test results. I agree with the assessment, diagnosis, and plan of care documented in the resident's note. 

## 2015-03-07 NOTE — Addendum Note (Signed)
Addended by: Orson Gear on: 03/07/2015 11:19 AM   Modules accepted: Orders

## 2015-03-28 ENCOUNTER — Other Ambulatory Visit: Payer: Self-pay

## 2015-03-28 ENCOUNTER — Ambulatory Visit (INDEPENDENT_AMBULATORY_CARE_PROVIDER_SITE_OTHER): Payer: Self-pay | Admitting: Internal Medicine

## 2015-03-28 ENCOUNTER — Encounter: Payer: Self-pay | Admitting: Internal Medicine

## 2015-03-28 VITALS — BP 113/76 | HR 92 | Temp 98.2°F | Ht 70.0 in | Wt 210.7 lb

## 2015-03-28 DIAGNOSIS — R252 Cramp and spasm: Secondary | ICD-10-CM

## 2015-03-28 DIAGNOSIS — E119 Type 2 diabetes mellitus without complications: Secondary | ICD-10-CM

## 2015-03-28 DIAGNOSIS — I10 Essential (primary) hypertension: Secondary | ICD-10-CM

## 2015-03-28 DIAGNOSIS — I2510 Atherosclerotic heart disease of native coronary artery without angina pectoris: Secondary | ICD-10-CM

## 2015-03-28 DIAGNOSIS — Z87891 Personal history of nicotine dependence: Secondary | ICD-10-CM

## 2015-03-28 DIAGNOSIS — Z79899 Other long term (current) drug therapy: Secondary | ICD-10-CM

## 2015-03-28 DIAGNOSIS — Z23 Encounter for immunization: Secondary | ICD-10-CM

## 2015-03-28 DIAGNOSIS — Z951 Presence of aortocoronary bypass graft: Secondary | ICD-10-CM

## 2015-03-28 DIAGNOSIS — Z0000 Encounter for general adult medical examination without abnormal findings: Secondary | ICD-10-CM

## 2015-03-28 DIAGNOSIS — Z794 Long term (current) use of insulin: Secondary | ICD-10-CM

## 2015-03-28 DIAGNOSIS — Z7982 Long term (current) use of aspirin: Secondary | ICD-10-CM

## 2015-03-28 LAB — POCT GLYCOSYLATED HEMOGLOBIN (HGB A1C): Hemoglobin A1C: 6.6

## 2015-03-28 LAB — GLUCOSE, CAPILLARY: Glucose-Capillary: 105 mg/dL — ABNORMAL HIGH (ref 65–99)

## 2015-03-28 MED ORDER — METFORMIN HCL 1000 MG PO TABS: 1000.0000 mg | ORAL_TABLET | Freq: Two times a day (BID) | ORAL | Status: DC

## 2015-03-28 MED ORDER — INSULIN ASPART PROT & ASPART (70-30 MIX) 100 UNIT/ML ~~LOC~~ SUSP: SUBCUTANEOUS | Status: DC

## 2015-03-28 NOTE — Assessment & Plan Note (Addendum)
BP Readings from Last 3 Encounters:  03/28/15 113/76  02/27/15 129/78  01/07/15 155/97    Lab Results  Component Value Date   NA 138 01/07/2015   K 3.9 01/07/2015   CREATININE 0.92 01/07/2015    Assessment: Blood pressure control: controlled Progress toward BP goal:  at goal Comments:   Plan: Medications:  Continue lisinopril- HCTZ 10-12.5mg  Educational resources provided:   Self management tools provided:   Other plans: if needed would add BB in the future for CAD but currently doing fine and cost is a MAJOR factor.

## 2015-03-28 NOTE — Progress Notes (Signed)
[facility] Subjective:   Patient ID: Jason Velasquez male   DOB: 10/11/1955 59 y.o.   MRN: [id number]  HPI: [name] is a 59 y.o. male with a PMH detailed below who presents for 1 month follow up of his DM.  He has done very well with his diabeties since being diagnosed about 3 months ago.  It was certainly very odd that he quickly became a very uncontrolled diabetic and likely has regained some insulin sensitivity with starting on insulin. He has been doing very well on 70/30 taking 30 units a day.  HE is taking metformin 500mg  twice a day without side effects.  HE is very pleased with his progress.   Past Medical History  Diagnosis Date  . Hypertension   . Hypercholesterolemia   . CAD (coronary artery disease) of artery bypass graft 06/25/2014    CABG- 2007   . History of CVA (cerebrovascular accident) 07/12/2014  . DKA (diabetic ketoacidoses) 12/07/2014  . Controlled type 2 diabetes mellitus without complication 06/04/4781    Dx March of 2016. GAD65 negative Anti Islet cell negative    . CAD (coronary artery disease), native coronary artery 06/25/2014    CABG- 2007   . 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 aspirin 81 MG tablet Take 2 pills daily (Patient taking differently: Take 81 mg by mouth daily. Take 2 pills daily) 30 tablet 5  . Blood Gluc Meter Disp-Strips (BLOOD GLUCOSE METER DISPOSABLE) DEVI 1 strip by Does not apply route 4 (four) times daily -  before meals and at bedtime. 50 each 11  . Blood Glucose Monitoring Suppl (RELION PRIME MONITOR) DEVI 1 Device by Does not apply route 4 (four) times daily -  with meals and at bedtime. 1 Device 3  . insulin aspart protamine- aspart (NOVOLOG MIX 70/30) (70-30) 100 UNIT/ML injection Inject 20 units in the morning and 20 units in the evening before meals. 10 mL 5  .  lisinopril-hydrochlorothiazide (PRINZIDE,ZESTORETIC) 10-12.5 MG per tablet Take 1 tablet by mouth daily.    . metFORMIN (GLUCOPHAGE) 1000 MG tablet Take 1 tablet (1,000 mg total) by mouth 2 (two) times daily with a meal. 60 tablet 11  . rosuvastatin (CRESTOR) 20 MG tablet Take 1 tablet (20 mg total) by mouth at bedtime. 30 tablet 11  . Insulin Syringes, Disposable, U-100 0.5 ML MISC 1 applicator by Does not apply route 2 (two) times daily with a meal. 100 each 11  . Lancets MISC 1 Device by Does not apply route 4 (four) times daily -  before meals and at bedtime. 100 each 11   No current facility-administered medications for this visit.   Family History  Problem Relation Age of Onset  . Hypertension Mother   . Diabetes Father   . Hypertension Sister    History   Social History  . Marital Status: Divorced    Spouse Name: N/A  . Number of Children: N/A  . Years of Education: 13   Occupational History  .  Unemployed   Social History Main Topics  . Smoking status: Former Smoker    Quit date: 10/04/2009  . Smokeless tobacco: Not on file  . Alcohol Use: No  . Drug Use: No  . Sexual Activity: Not on file   Other Topics Concern  . None   Social History Narrative   Review of  Systems: Review of Systems  Constitutional: Negative for fever, chills and malaise/fatigue.  Eyes: Negative for blurred vision.  Respiratory: Negative for shortness of breath.   Cardiovascular: Negative for chest pain and leg swelling.  Gastrointestinal: Negative for heartburn and abdominal pain.  Genitourinary: Negative for dysuria.  Musculoskeletal: Negative for myalgias.  Skin: Negative for rash.  Neurological: Negative for dizziness and headaches.  Psychiatric/Behavioral: Negative for depression.     Objective:  Physical Exam: Filed Vitals:   03/28/15 0925  BP: 113/76  Pulse: 92  Temp: 98.2 F (36.8 C)  TempSrc: Oral  Height: 5\' 10"  (1.778 m)  Weight: 210 lb 11.2 oz (95.573 kg)  SpO2: 99%   Physical Exam  Constitutional: He is oriented to person, place, and time and well-developed, well-nourished, and in no distress.  HENT:  Head: Normocephalic and atraumatic.  Cardiovascular: Normal rate and regular rhythm.   No murmur heard. Pulmonary/Chest: Effort normal and breath sounds normal.  Abdominal: Soft. Bowel sounds are normal.  Neurological: He is alert and oriented to person, place, and time.  Skin: Skin is warm and dry.  Psychiatric: Mood normal.  Nursing note and vitals reviewed.   Wt Readings from Last 5 Encounters:  03/28/15 210 lb 11.2 oz (95.573 kg)  02/27/15 210 lb (95.255 kg)  01/07/15 201 lb 6.4 oz (91.354 kg)  12/19/14 198 lb 4.8 oz (89.948 kg)  12/12/14 190 lb 8 oz (86.41 kg)    Assessment & Plan:  Case discussed with Dr. Daryll Drown  Controlled type 2 diabetes mellitus without complication Lab Results  Component Value Date   HGBA1C 6.6 03/28/2015   HGBA1C 15.8* 12/07/2014   HGBA1C 6.4 07/12/2014     Assessment: Diabetes control: good control (HgbA1C at goal) Progress toward A1C goal:  at goal Comments: he has done remarkably well, very compliant  Plan: Medications:  Increase metformin to 1g BID, decrease Novolog 70/30 to 20u BID.  Home glucose monitoring: Frequency: 2 times a day Timing: before meals Instruction/counseling given: discussed the need for weight loss and discussed diet Educational resources provided:   Self management tools provided: copy of home glucose meter download Other plans: I told him it is my hope we can eventually get off insulin (at least for a period of time).  He is very motivated and has been aggressive about his care.  I will drop visits back to Q3 months. But invited patient to please call with insulin issues and I would be happy to assit him with further titration by phone.  I have removed CKD as a issue has his renal function has now returned to normal and his urine micro showed no  microalbuminuria.    Preventative health care - Gave Tdap, and pneumococcal 23 vaccine - Has appointment for colonoscopy. - It is very inspiring how much care he has taken of his health after the diagnosis of DM, I hope this will continue.  Bilateral leg cramps - He reports to me today that he has noticed bilateral leg and calf cramping when walking for over 10 minutes, he does notice this also if he walks uphill. - Given his previous CAD, HTN, DM, and HLD I have a high suspicion for atherosclerosis, however he is being treated now with ASA and high intensity statin. - I will obtain ABI on his legs to ascertain disease severity.  I encouraged him to increase his exercise.  Essential hypertension BP Readings from Last 3 Encounters:  03/28/15 113/76  02/27/15 129/78  01/07/15 155/97  Lab Results  Component Value Date   NA 138 01/07/2015   K 3.9 01/07/2015   CREATININE 0.92 01/07/2015    Assessment: Blood pressure control: controlled Progress toward BP goal:  at goal Comments:   Plan: Medications:  Continue lisinopril- HCTZ 10-12.5mg  Educational resources provided:   Self management tools provided:   Other plans: if needed would add BB in the future for CAD but currently doing fine and cost is a MAJOR factor.   CAD (coronary artery disease), native coronary artery - continue asa, crestor 20.  No BB right now BP is at goal and cost is a factor.  S/P CABG (coronary artery bypass graft) - No current issues.    Medications Ordered Meds ordered this encounter  Medications  . metFORMIN (GLUCOPHAGE) 1000 MG tablet    Sig: Take 1 tablet (1,000 mg total) by mouth 2 (two) times daily with a meal.    Dispense:  60 tablet    Refill:  11    Please note change in dose (d/c 500mg  BID)  . insulin aspart protamine- aspart (NOVOLOG MIX 70/30) (70-30) 100 UNIT/ML injection    Sig: Inject 20 units in the morning and 20 units in the evening before meals.    Dispense:  10 mL     Refill:  5   Other Orders Orders Placed This Encounter  Procedures  . Pneumococcal polysaccharide vaccine 23-valent greater than or equal to 2yo subcutaneous/IM  . Tdap vaccine greater than or equal to 7yo IM  . Glucose, capillary  . POCT HgB A1C   Follow Up: Return in about 3 months (around 06/28/2015).

## 2015-03-28 NOTE — Assessment & Plan Note (Signed)
-   He reports to me today that he has noticed bilateral leg and calf cramping when walking for over 10 minutes, he does notice this also if he walks uphill. - Given his previous CAD, HTN, DM, and HLD I have a high suspicion for atherosclerosis, however he is being treated now with ASA and high intensity statin. - I will obtain ABI on his legs to ascertain disease severity.  I encouraged him to increase his exercise.

## 2015-03-28 NOTE — Assessment & Plan Note (Addendum)
Lab Results  Component Value Date   HGBA1C 6.6 03/28/2015   HGBA1C 15.8* 12/07/2014   HGBA1C 6.4 07/12/2014     Assessment: Diabetes control: good control (HgbA1C at goal) Progress toward A1C goal:  at goal Comments: he has done remarkably well, very compliant  Plan: Medications:  Increase metformin to 1g BID, decrease Novolog 70/30 to 20u BID.  Home glucose monitoring: Frequency: 2 times a day Timing: before meals Instruction/counseling given: discussed the need for weight loss and discussed diet Educational resources provided:   Self management tools provided: copy of home glucose meter download Other plans: I told him it is my hope we can eventually get off insulin (at least for a period of time).  He is very motivated and has been aggressive about his care.  I will drop visits back to Q3 months. But invited patient to please call with insulin issues and I would be happy to assit him with further titration by phone.  I have removed CKD as a issue has his renal function has now returned to normal and his urine micro showed no microalbuminuria.

## 2015-03-28 NOTE — Assessment & Plan Note (Signed)
-   Gave Tdap, and pneumococcal 23 vaccine - Has appointment for colonoscopy. - It is very inspiring how much care he has taken of his health after the diagnosis of DM, I hope this will continue.

## 2015-03-28 NOTE — Assessment & Plan Note (Signed)
No current issues

## 2015-03-28 NOTE — Patient Instructions (Signed)
General Instructions:  I want you to decrease insulin to 20 units twice a day and increase the metformin to 1g BID, please call if you have low sugars and I will have you decrease insulin further.  Thank you for bringing your medicines today. This helps [place] keep you safe from mistakes.   Progress Toward Treatment Goals:  Treatment Goal 03/28/2015  Hemoglobin A1C at goal  Blood pressure at goal  Prevent falls -    Self Care Goals & Plans:  Self Care Goal 03/28/2015  Manage my medications take my medicines as prescribed; bring my medications to every visit; refill my medications on time  Eat healthy foods eat more vegetables; eat foods that are low in salt; eat baked foods instead of fried foods  Be physically active take a walk every day    Home Blood Glucose Monitoring 03/28/2015  Check my blood sugar 2 times a day  When to check my blood sugar before meals     Care Management & Community Referrals:  Referral 02/27/2015  Referrals made for care management support diabetes educator

## 2015-03-28 NOTE — Assessment & Plan Note (Addendum)
-   continue asa, crestor 20.  No BB right now BP is at goal and cost is a factor.

## 2015-04-01 NOTE — Progress Notes (Signed)
Internal Medicine Clinic Attending  Case discussed with Dr. Hoffman soon after the resident saw the patient.  We reviewed the resident's history and exam and pertinent patient test results.  I agree with the assessment, diagnosis, and plan of care documented in the resident's note. 

## 2015-05-06 ENCOUNTER — Ambulatory Visit (AMBULATORY_SURGERY_CENTER): Payer: Self-pay | Admitting: *Deleted

## 2015-05-06 VITALS — Ht 70.5 in | Wt 206.0 lb

## 2015-05-06 DIAGNOSIS — Z1211 Encounter for screening for malignant neoplasm of colon: Secondary | ICD-10-CM

## 2015-05-06 MED ORDER — POLYETHYLENE GLYCOL 3350 17 GM/SCOOP PO POWD: 1.0000 | Freq: Once | ORAL | Status: DC

## 2015-05-06 MED ORDER — BISACODYL 5 MG PO TBEC: DELAYED_RELEASE_TABLET | ORAL | Status: DC

## 2015-05-06 NOTE — Progress Notes (Signed)
No egg or soy allergy. No anesthesia problems.  No home O2.  No diet meds.  

## 2015-05-19 ENCOUNTER — Telehealth: Payer: Self-pay | Admitting: Internal Medicine

## 2015-05-20 ENCOUNTER — Ambulatory Visit (AMBULATORY_SURGERY_CENTER): Payer: Self-pay | Admitting: Internal Medicine

## 2015-05-20 ENCOUNTER — Encounter: Payer: Self-pay | Admitting: Internal Medicine

## 2015-05-20 VITALS — BP 118/82 | HR 81 | Temp 98.7°F | Resp 49 | Ht 70.5 in | Wt 206.0 lb

## 2015-05-20 DIAGNOSIS — D124 Benign neoplasm of descending colon: Secondary | ICD-10-CM

## 2015-05-20 DIAGNOSIS — D125 Benign neoplasm of sigmoid colon: Secondary | ICD-10-CM

## 2015-05-20 DIAGNOSIS — Z1211 Encounter for screening for malignant neoplasm of colon: Secondary | ICD-10-CM

## 2015-05-20 LAB — GLUCOSE, CAPILLARY
GLUCOSE-CAPILLARY: 67 mg/dL (ref 65–99)
Glucose-Capillary: 63 mg/dL — ABNORMAL LOW (ref 65–99)
Glucose-Capillary: 175 mg/dL — ABNORMAL HIGH (ref 65–99)

## 2015-05-20 MED ORDER — SODIUM CHLORIDE 0.9 % IV SOLN: 500.0000 mL | INTRAVENOUS | Status: DC

## 2015-05-20 MED ORDER — DEXTROSE 5 % IV SOLN: INTRAVENOUS | Status: DC

## 2015-05-20 NOTE — Progress Notes (Addendum)
Pt. CBG=67 rechecked 63. Pt. Denies s/s of  Hypoglycemia D5W  Up doctor aware. pt. Taken to procedure room before rechecking will follow up repeat of CBG In recovery.

## 2015-05-20 NOTE — Progress Notes (Signed)
Transferred to recovery room. A/O x3, pleased with MAC.  VSS.  Report to Jane, RN. 

## 2015-05-20 NOTE — Progress Notes (Signed)
Called to room to assist during endoscopic procedure.  Patient ID and intended procedure confirmed with present staff. Received instructions for my participation in the procedure from the performing physician.  

## 2015-05-20 NOTE — Op Note (Signed)
[facility]  [address] [place], [address]   COLONOSCOPY PROCEDURE REPORT  PATIENT: Jason, Velasquez  MR#: [id number] BIRTHDATE: December 03, 1955 , 59  yrs. old GENDER: male ENDOSCOPIST: Gatha Mayer, MD, [facility] PROCEDURE DATE:  05/20/2015 PROCEDURE:   Colonoscopy, screening First Screening Colonoscopy - Avg.  risk and is 50 yrs.  old or older Yes.  Prior Negative Screening - Now for repeat screening. N/A  History of Adenoma - Now for follow-up colonoscopy & has been > or = to 3 yrs.  N/A  Polyps removed today? Yes ASA CLASS:   Class II INDICATIONS:Screening for colonic neoplasia and Colorectal Neoplasm Risk Assessment for this procedure is average risk. MEDICATIONS: Propofol 300 mg IV and Monitored anesthesia care  DESCRIPTION OF PROCEDURE:   After the risks benefits and alternatives of the procedure were thoroughly explained, informed consent was obtained.  The digital rectal exam revealed no abnormalities of the rectum, revealed no prostatic nodules, and revealed the prostate was not enlarged.   The LB [id number] endoscope was introduced through the anus and advanced to the cecum, which was identified by both the appendix and ileocecal valve. No adverse events experienced.   The quality of the prep was adequate (MiraLax was used)  The instrument was then slowly withdrawn as the colon was fully examined. Estimated blood loss is zero unless otherwise noted in this procedure report.  COLON FINDINGS: Three  polyps were found in the descending colon (1 diminutive cold snare)  and distal sigmoid colon (both hot 1 diminutive and 1 20 mm pretx w/ EPI injection x 3 cc polyp and stalk).  Polypectomies were performed with a cold snare and using snare cautery.  The resection was complete, the polyp tissue was completely retrieved and sent to histology.   The examination was otherwise normal.  Retroflexed views revealed no abnormalities. The time to cecum = 3.0  Withdrawal time = 15.6   The scope was withdrawn and the procedure completed. COMPLICATIONS: There were no immediate complications.  ENDOSCOPIC IMPRESSION: 1.   Three pedunculated polyps were found in the descending colon and sigmoid colon; polypectomies were performed with a cold snare and using snare cautery 2.   The examination was otherwise normal  RECOMMENDATIONS: 1.  Hold Aspirin and all other NSAIDS for 2 weeks. 2.  Timing of repeat colonoscopy will be determined by pathology findings.  eSigned:  Gatha Mayer, MD, [facility] 05/20/2015 11:14 AM   cc: Dr. Joni Reining and The Patient

## 2015-05-20 NOTE — Patient Instructions (Addendum)
I found and removed 3 polyps - one was large. I will let you know pathology results and when to have another routine colonoscopy by mail.  I appreciate the opportunity to care for you. Gatha Mayer, MD, FACG  YOU HAD AN ENDOSCOPIC PROCEDURE TODAY AT [facility]:   Refer to the procedure report that was given to you for any specific questions about what was found during the examination.  If the procedure report does not answer your questions, please call your gastroenterologist to clarify.  If you requested that your care partner not be given the details of your procedure findings, then the procedure report has been included in a sealed envelope for you to review at your convenience later.  YOU SHOULD EXPECT: Some feelings of bloating in the abdomen. Passage of more gas than usual.  Walking can help get rid of the air that was put into your GI tract during the procedure and reduce the bloating. If you had a lower endoscopy (such as a colonoscopy or flexible sigmoidoscopy) you may notice spotting of blood in your stool or on the toilet paper. If you underwent a bowel prep for your procedure, you may not have a normal bowel movement for a few days.  Please Note:  You might notice some irritation and congestion in your nose or some drainage.  This is from the oxygen used during your procedure.  There is no need for concern and it should clear up in a day or so.  SYMPTOMS TO REPORT IMMEDIATELY:   Following lower endoscopy (colonoscopy or flexible sigmoidoscopy):  Excessive amounts of blood in the stool  Significant tenderness or worsening of abdominal pains  Swelling of the abdomen that is new, acute  Fever of 100F or higher    For urgent or emergent issues, a gastroenterologist can be reached at any hour by calling [phone].   DIET: Your first meal following the procedure should be a small meal and then it is ok to progress to your normal diet. Heavy or fried  foods are harder to digest and may make you feel nauseous or bloated.  Likewise, meals heavy in dairy and vegetables can increase bloating.  Drink plenty of fluids but you should avoid alcoholic beverages for 24 hours.  ACTIVITY:  You should plan to take it easy for the rest of today and you should NOT DRIVE or use heavy machinery until tomorrow (because of the sedation medicines used during the test).    FOLLOW UP: Our staff will call the number listed on your records the next business day following your procedure to check on you and address any questions or concerns that you may have regarding the information given to you following your procedure. If we do not reach you, we will leave a message.  However, if you are feeling well and you are not experiencing any problems, there is no need to return our call.  We will assume that you have returned to your regular daily activities without incident.  If any biopsies were taken you will be contacted by phone or by letter within the next 1-3 weeks.  Please call us at [phone] if you have not heard about the biopsies in 3 weeks.    SIGNATURES/CONFIDENTIALITY: You and/or your care partner have signed paperwork which will be entered into your electronic medical record.  These signatures attest to the fact that that the information above on your After Visit Summary has been reviewed and  is understood.  Full responsibility of the confidentiality of this discharge information lies with you and/or your care-partner.  Polyp information given.

## 2015-05-21 ENCOUNTER — Telehealth: Payer: Self-pay | Admitting: *Deleted

## 2015-05-21 NOTE — Telephone Encounter (Signed)
  Follow up Call-  Call back number 05/20/2015  Post procedure Call Back phone  # [phone]  Permission to leave phone message Yes     Patient questions:  Do you have a fever, pain , or abdominal swelling? No. Pain Score  0 *  Have you tolerated food without any problems? Yes.    Have you been able to return to your normal activities? Yes.    Do you have any questions about your discharge instructions: Diet   No. Medications  No. Follow up visit  No.  Do you have questions or concerns about your Care? No.  Actions: * If pain score is 4 or above: No action needed, pain <4.

## 2015-05-27 ENCOUNTER — Encounter: Payer: Self-pay | Admitting: Internal Medicine

## 2015-05-27 DIAGNOSIS — Z86010 Personal history of colonic polyps: Secondary | ICD-10-CM

## 2015-05-27 DIAGNOSIS — Z860101 Personal history of adenomatous and serrated colon polyps: Secondary | ICD-10-CM | POA: Insufficient documentation

## 2015-05-27 HISTORY — DX: Personal history of colonic polyps: Z86.010

## 2015-05-27 HISTORY — DX: Personal history of adenomatous and serrated colon polyps: Z86.0101

## 2015-05-27 NOTE — Progress Notes (Signed)
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

## 2015-05-31 ENCOUNTER — Encounter: Payer: Self-pay | Admitting: Internal Medicine

## 2015-05-31 DIAGNOSIS — D126 Benign neoplasm of colon, unspecified: Secondary | ICD-10-CM

## 2015-05-31 HISTORY — DX: Benign neoplasm of colon, unspecified: D12.6

## 2015-07-02 ENCOUNTER — Telehealth: Payer: Self-pay | Admitting: Dietician

## 2015-07-02 NOTE — Telephone Encounter (Signed)
Called to follow up on diabetes self management training started in may 2016 after new diagnosis. Patient says he is maintaining blood sugars at 90-120 most of the time with 160 sometimes after eating. He rates himself a 4/5 on  Mis goal of treating and carrying candy or something sweet for treatment of low blood sugar. He reports no other issues or concerns. Scheduled him to follow up with CDE on same day he sees the doctor.

## 2015-07-10 ENCOUNTER — Ambulatory Visit (INDEPENDENT_AMBULATORY_CARE_PROVIDER_SITE_OTHER): Payer: Self-pay | Admitting: Internal Medicine

## 2015-07-10 ENCOUNTER — Ambulatory Visit: Payer: Self-pay | Admitting: Dietician

## 2015-07-10 ENCOUNTER — Ambulatory Visit (INDEPENDENT_AMBULATORY_CARE_PROVIDER_SITE_OTHER): Payer: Self-pay | Admitting: Dietician

## 2015-07-10 VITALS — BP 121/74 | HR 94 | Temp 98.4°F | Ht 71.0 in | Wt 209.5 lb

## 2015-07-10 DIAGNOSIS — R0789 Other chest pain: Secondary | ICD-10-CM

## 2015-07-10 DIAGNOSIS — Z0000 Encounter for general adult medical examination without abnormal findings: Secondary | ICD-10-CM

## 2015-07-10 DIAGNOSIS — E119 Type 2 diabetes mellitus without complications: Secondary | ICD-10-CM

## 2015-07-10 DIAGNOSIS — Z860101 Personal history of adenomatous and serrated colon polyps: Secondary | ICD-10-CM

## 2015-07-10 DIAGNOSIS — Z794 Long term (current) use of insulin: Secondary | ICD-10-CM

## 2015-07-10 DIAGNOSIS — Z951 Presence of aortocoronary bypass graft: Secondary | ICD-10-CM

## 2015-07-10 DIAGNOSIS — R079 Chest pain, unspecified: Secondary | ICD-10-CM | POA: Insufficient documentation

## 2015-07-10 DIAGNOSIS — I2510 Atherosclerotic heart disease of native coronary artery without angina pectoris: Secondary | ICD-10-CM

## 2015-07-10 DIAGNOSIS — Z86010 Personal history of colonic polyps: Secondary | ICD-10-CM

## 2015-07-10 DIAGNOSIS — I10 Essential (primary) hypertension: Secondary | ICD-10-CM

## 2015-07-10 DIAGNOSIS — Z713 Dietary counseling and surveillance: Secondary | ICD-10-CM

## 2015-07-10 MED ORDER — INSULIN ASPART PROT & ASPART (70-30 MIX) 100 UNIT/ML ~~LOC~~ SUSP: SUBCUTANEOUS | Status: DC

## 2015-07-10 NOTE — Assessment & Plan Note (Signed)
-  Declined Flu shot - Hep C screening

## 2015-07-10 NOTE — Patient Instructions (Addendum)
General Instructions: You are doing a great job.  I do want to refer you over to the cardiologist to talk about your chest pain.  Thank you for bringing your medicines today. This helps [place] keep you safe from mistakes.   Progress Toward Treatment Goals:  Treatment Goal 07/10/2015  Hemoglobin A1C at goal  Blood pressure at goal  Prevent falls improved    Self Care Goals & Plans:  Self Care Goal 07/10/2015  Manage my medications take my medicines as prescribed; bring my medications to every visit; refill my medications on time  Monitor my health keep track of my blood glucose; bring my glucose meter and log to each visit; check my feet daily  Eat healthy foods -  Be physically active -    Home Blood Glucose Monitoring 07/10/2015  Check my blood sugar 2 times a day  When to check my blood sugar before breakfast     Care Management & Community Referrals:  Referral 07/10/2015  Referrals made for care management support none needed

## 2015-07-10 NOTE — Assessment & Plan Note (Signed)
Since our last visit he has had a colonosocpy that found TV adenoma, his repeat Colonoscopy with [name] is due in 2019.

## 2015-07-10 NOTE — Assessment & Plan Note (Signed)
Of concern is his report of exertional chest pain.  We have no records of recent cardiac catheretization or stress testing.  He cannot remember any visits with cardiology since his CABG.    -Given his known history of CAD as well as multiple risk factors (DM, HTN, HLD) and until recently poor control of these risk factors I am concerned for CAD of his CABG.  I will refer him over to cardiology without a stress test given I am concerned for his need of a cardiac catheretization. - His risk factors today are very well controlled and he is on ideal medications with the exception that he is not on a BB.  This can be added but he is hesitant to start another medication.  I have also offered him SL NTG but he was concerned and said he would not take it if it was prescribed.

## 2015-07-10 NOTE — Progress Notes (Signed)
Diabetes Self-Management Education  Visit Type:  Follow-up  Appt. Start Time: 1400 Appt. End Time: 1430  07/10/2015  [name], identified by name and date of birth, is a 59 y.o. male with a diagnosis of Diabetes Type 2  .   ASSESSMENT   weight  209.5# BMI.29       Diabetes Self-Management Education - 07/10/15 1500    Health Coping   How would you rate your overall health? Good   Psychosocial Assessment   Patient Belief/Attitude about Diabetes Motivated to manage diabetes   Self-care barriers Lack of transportation;Lack of material resources   Self-management support Doctor's office;Friends;CDE visits   Patient Concerns Nutrition/Meal planning;Problem Solving;Support   Special Needs None   Preferred Learning Style No preference indicated   Learning Readiness Ready   Complications   Last HgB A1C per patient/outside source --  6.6   How often do you check your blood sugar? 1-2 times/day   Fasting Blood glucose range (mg/dL) 70-129;130-179   Postprandial Blood glucose range (mg/dL) 130-179;180-200   Number of hypoglycemic episodes per month --  0   Number of hyperglycemic episodes per week 1   Can you tell when your blood sugar is high? Yes   What do you do if your blood sugar is high? --  he didn;t know so we covered this today   Have you had a dilated eye exam in the past 12 months? Yes   Have you had a dental exam in the past 12 months? No  he does not have teeth   Are you checking your feet? Yes   How many days per week are you checking your feet? 4   Dietary Intake   Breakfast cereal- noney nuit or frosted flakes or oatmeal  2 cups coffee with sweet n low   Lunch beans, yans, cabbage, carrots, peppers   Beverage(s) coffee, water, diet pepsi   Exercise   Exercise Type ADL's;Light (walking / raking leaves)   Patient Education   Previous Diabetes Education Yes -here last spring-02/27/15 and in the hospital   Disease state  Definition of diabetes, type 1 and 2,  and the diagnosis of diabetes;Factors that contribute to the development of diabetes   Nutrition management  Effects of alcohol on blood glucose and safety factors with consumption of alcohol.;Meal options for control of blood glucose level and chronic complications.   Acute complications Discussed and identified patients' treatment of hyperglycemia.   Chronic complications Relationship between chronic complications and blood glucose control;Assessed and discussed foot care and prevention of foot problems;Lipid levels, blood glucose control and heart disease;Retinopathy and reason for yearly dilated eye exams;Nephropathy, what it is, prevention of, the use of ACE, ARB's and early detection of through urine microalbumia.   Psychosocial adjustment Role of stress on diabetes   Personal strategies to promote health DSMS is to talk with his woman friends who looks up information on the Internet and has personal experience with diabetes, Doctor's office and CDE    Individualized Goals (developed by patient)   Reducing Risk treat hypoglycemia with 15 grams of carbs if blood glucose less than 70mg /dL   Patient Self-Evaluation of Goals - Patient rates self as meeting previously set goals (% of time)   Monitoring >75%   Outcomes   Program Status Completed   Subsequent Visit   Since your last visit have you continued or begun to take your medications as prescribed? Yes   Since your last visit have you had your blood  pressure checked? Yes   Is your most recent blood pressure lower, unchanged, or higher since your last visit? Unchanged   Since your last visit have you experienced any weight changes? No change   Since your last visit, are you checking your blood glucose at least once a day? Yes      Learning Objective:  Patient will have a greater understanding of diabetes self-management. Patient education plan is to attend individual and/or group sessions per assessed needs and concerns.   Plan:    There are no Patient Instructions on file for this visit.   Expected Outcomes:  Demonstrated interest in learning. Expect positive outcomes Education material provided: Living Well with Diabetes If problems or questions, patient to contact team via:  Phone Future DSME appointment: - Yearly

## 2015-07-10 NOTE — Assessment & Plan Note (Signed)
BP Readings from Last 3 Encounters:  07/10/15 121/74  05/20/15 118/82  03/28/15 113/76    Lab Results  Component Value Date   NA 138 01/07/2015   K 3.9 01/07/2015   CREATININE 0.92 01/07/2015    Assessment: Blood pressure control: controlled Progress toward BP goal:  at goal Comments: well controlled  Plan: Medications:  Lisinopril-HCTZ 10-12.5mg  daily Educational resources provided:   Self management tools provided:   Other plans: can consider adding low dose BB if future

## 2015-07-10 NOTE — Assessment & Plan Note (Signed)
Lab Results  Component Value Date   HGBA1C 6.6 03/28/2015   HGBA1C 15.8* 12/07/2014   HGBA1C 6.4 07/12/2014    He reports he has been taking metformin BID and 20 unitso f 70/30 Novolin BID.  He notes sugars in 80-130 range, highest was 200 (after meal),  He does note one sugar of 60s due to waiting to long to eat.  He does report a couple of other times feeling like his sugar is low and eating sweet tarts (which he keeps in his pocket). Assessment: Diabetes control: good control (HgbA1C at goal) Progress toward A1C goal:  at goal Comments: reports well controlled sugars (no meter) suspect he may be having some mild hypoglycemic symptoms.  Plan: Medications:  Metformin 1g BID,. Novoling 70/30 15 units BID (decrease) Home glucose monitoring: Frequency: 2 times a day Timing: before breakfast Instruction/counseling given: reminded to bring blood glucose meter & log to each visit and reminded to bring medications to each visit Educational resources provided:  (REFUSED) Self management tools provided:   Other plans: dropping his insulin as I suspect he is underreporting some hypoglycemia.  Will have him back in 3 months.  We discussed the possibility of getting off insulin at that time.

## 2015-07-10 NOTE — Progress Notes (Signed)
[name] Subjective:   Patient ID: Jason Velasquez male   DOB: [birth year] 59 y.o.   MRN: [id number]  HPI: [name] is a 59 y.o. male with a PMH detailed below who presents for 3 month follow up of HTN and DM. For his DM he has done very well, gaining rapid glycemic control after an admission for DKA in march.  Please see problem based A&P below for further details of the status of his chronic medical problems.  Past Medical History  Diagnosis Date  . Hypertension   . Hypercholesterolemia   . CAD (coronary artery disease) of artery bypass graft 06/25/2014    CABG- 2007   . History of CVA (cerebrovascular accident) 07/12/2014  . DKA (diabetic ketoacidoses) 12/07/2014  . Controlled type 2 diabetes mellitus without complication 11/08/4980    Dx March of 2016. GAD65 negative Anti Islet cell negative    . CAD (coronary artery disease), native coronary artery 06/25/2014    CABG- 2007   . 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 Stroke     2015  . Hx of adenomatous colonic polyps 05/27/2015  . Tubular adenoma of colon 05/31/2015    Colonoscopy 05/20/15 with [name]: 3 tubullar adenoma's largest 2.3cm.  Plan for repeat Colonoscopy in 2019.   Current Outpatient Prescriptions  Medication Sig Dispense Refill  . aspirin 81 MG tablet Take 2 pills daily (Patient taking differently: Take 81 mg by mouth daily. Take 2 pills daily) 30 tablet 5  . Blood Gluc Meter Disp-Strips (BLOOD GLUCOSE METER DISPOSABLE) DEVI 1 strip by Does not apply route 4 (four) times daily -  before meals and at bedtime. 50 each 11  . Blood Glucose Monitoring Suppl (RELION PRIME MONITOR) DEVI 1 Device by Does not apply route 4 (four) times daily -  with meals and at bedtime. 1 Device 3  . insulin aspart protamine- aspart (NOVOLOG MIX 70/30) (70-30) 100 UNIT/ML injection Inject 15 units in the morning and 15 units in the evening before meals. 10 mL 0  .  Insulin Syringes, Disposable, U-100 0.5 ML MISC 1 applicator by Does not apply route 2 (two) times daily with a meal. 100 each 11  . Lancets MISC 1 Device by Does not apply route 4 (four) times daily -  before meals and at bedtime. 100 each 11  . lisinopril-hydrochlorothiazide (PRINZIDE,ZESTORETIC) 10-12.5 MG per tablet Take 1 tablet by mouth daily.    . metFORMIN (GLUCOPHAGE) 1000 MG tablet Take 1 tablet (1,000 mg total) by mouth 2 (two) times daily with a meal. 60 tablet 11  . rosuvastatin (CRESTOR) 20 MG tablet Take 1 tablet (20 mg total) by mouth at bedtime. 30 tablet 11   No current facility-administered medications for this visit.   Family History  Problem Relation Age of Onset  . Hypertension Mother   . Diabetes Father   . Hypertension Sister   . Colon cancer Neg Hx    Social History   Social History  . Marital Status: Divorced    Spouse Name: N/A  . Number of Children: N/A  . Years of Education: 13   Occupational History  .  Unemployed   Social History Main Topics  . Smoking status: Former Smoker    Quit date: 10/04/2009  . Smokeless tobacco: Never Used  . Alcohol Use: No  . Drug Use: No  . Sexual Activity: Not on file   Other Topics  Concern  . Not on file   Social History Narrative   Review of Systems: Review of Systems  Constitutional: Negative for fever, weight loss and malaise/fatigue.  Eyes: Negative for blurred vision.  Respiratory: Negative for cough and shortness of breath.   Cardiovascular: Positive for chest pain (reports when doing heavy lifting. substernal, left sided, with no radiation). Negative for palpitations, orthopnea, claudication and leg swelling.  Gastrointestinal: Negative for heartburn and abdominal pain.  Genitourinary: Negative for dysuria.  Musculoskeletal: Negative for myalgias.  Skin: Negative for rash.  Neurological: Negative for dizziness and headaches.  Endo/Heme/Allergies: Negative for polydipsia.  Psychiatric/Behavioral:  Negative for depression.     Objective:  Physical Exam: Filed Vitals:   07/10/15 1325  BP: 121/74  Pulse: 94  Temp: 98.4 F (36.9 C)  TempSrc: Oral  Height: 5\' 11"  (1.803 m)  Weight: 209 lb 8 oz (95.029 kg)  SpO2: 99%   Physical Exam  Constitutional: He is well-developed, well-nourished, and in no distress. No distress.  HENT:  Head: Normocephalic and atraumatic.  Eyes: Conjunctivae are normal.  Cardiovascular: Normal rate, regular rhythm, normal heart sounds and intact distal pulses.   No murmur heard. Pulmonary/Chest: Effort normal and breath sounds normal. No respiratory distress. He has no wheezes. He has no rales.  Abdominal: Soft. Bowel sounds are normal. He exhibits no distension. There is no tenderness.  Musculoskeletal: He exhibits no edema.  Neurological: He is alert.  Skin: Skin is warm and dry. He is not diaphoretic.  Psychiatric: Affect normal.  Nursing note and vitals reviewed.    Assessment & Plan:  Case discussed with Dr. [name]  Hx of adenomatous colonic polyps Since our last visit he has had a colonosocpy that found TV adenoma, his repeat Colonoscopy with [name] is due in 2019.  Essential hypertension BP Readings from Last 3 Encounters:  07/10/15 121/74  05/20/15 118/82  03/28/15 113/76    Lab Results  Component Value Date   NA 138 01/07/2015   K 3.9 01/07/2015   CREATININE 0.92 01/07/2015    Assessment: Blood pressure control: controlled Progress toward BP goal:  at goal Comments: well controlled  Plan: Medications:  Lisinopril-HCTZ 10-12.5mg  daily Educational resources provided:   Self management tools provided:   Other plans: can consider adding low dose BB if future   CAD (coronary artery disease), native coronary artery Of concern is his report of exertional chest pain.  We have no records of recent cardiac catheretization or stress testing.  He cannot remember any visits with cardiology since his CABG.    -Given his known  history of CAD as well as multiple risk factors (DM, HTN, HLD) and until recently poor control of these risk factors I am concerned for CAD of his CABG.  I will refer him over to cardiology without a stress test given I am concerned for his need of a cardiac catheretization. - His risk factors today are very well controlled and he is on ideal medications with the exception that he is not on a BB.  This can be added but he is hesitant to start another medication.  I have also offered him SL NTG but he was concerned and said he would not take it if it was prescribed.  Controlled type 2 diabetes mellitus without complication Lab Results  Component Value Date   HGBA1C 6.6 03/28/2015   HGBA1C 15.8* 12/07/2014   HGBA1C 6.4 07/12/2014    He reports he has been taking metformin BID and 20 unitso  f 70/30 Novolin BID.  He notes sugars in 80-130 range, highest was 200 (after meal),  He does note one sugar of 60s due to waiting to long to eat.  He does report a couple of other times feeling like his sugar is low and eating sweet tarts (which he keeps in his pocket). Assessment: Diabetes control: good control (HgbA1C at goal) Progress toward A1C goal:  at goal Comments: reports well controlled sugars (no meter) suspect he may be having some mild hypoglycemic symptoms.  Plan: Medications:  Metformin 1g BID,. Novoling 70/30 15 units BID (decrease) Home glucose monitoring: Frequency: 2 times a day Timing: before breakfast Instruction/counseling given: reminded to bring blood glucose meter & log to each visit and reminded to bring medications to each visit Educational resources provided:  (REFUSED) Self management tools provided:   Other plans: dropping his insulin as I suspect he is underreporting some hypoglycemia.  Will have him back in 3 months.  We discussed the possibility of getting off insulin at that time.     Preventative health care -Declined Flu shot - Hep C screening  Exertional chest  pain - Referral to Cardiology for consideration of cardiac cath.    Medications Ordered Meds ordered this encounter  Medications  . insulin aspart protamine- aspart (NOVOLOG MIX 70/30) (70-30) 100 UNIT/ML injection    Sig: Inject 15 units in the morning and 15 units in the evening before meals.    Dispense:  10 mL    Refill:  0   Other Orders Orders Placed This Encounter  Procedures  . Lipid Profile  . Hepatitis c antibody (reflex)  . HCV Comment:  . Ambulatory referral to Cardiology    Referral Priority:  Routine    Referral Type:  Consultation    Referral Reason:  Specialty Services Required    Requested Specialty:  Cardiology    Number of Visits Requested:  1   Follow Up: Return in about 3 months (around 10/10/2015).

## 2015-07-11 ENCOUNTER — Telehealth: Payer: Self-pay | Admitting: *Deleted

## 2015-07-11 ENCOUNTER — Ambulatory Visit: Payer: Self-pay

## 2015-07-11 LAB — LIPID PANEL
CHOL/HDL RATIO: 6.1 ratio — AB (ref 0.0–5.0)
Cholesterol, Total: 129 mg/dL (ref 100–199)
HDL: 21 mg/dL — AB (ref >39)
LDL CALC: 45 mg/dL (ref 0–99)
Triglycerides: 314 mg/dL — ABNORMAL HIGH (ref 0–149)
VLDL CHOLESTEROL CAL: 63 mg/dL — AB (ref 5–40)

## 2015-07-11 LAB — HCV COMMENT:

## 2015-07-11 LAB — HEPATITIS C ANTIBODY (REFLEX): HCV Ab: <0.1 s/co ratio (ref 0.0–0.9)

## 2015-07-11 NOTE — Telephone Encounter (Signed)
LVM FOR  PATIENT REGARDING HIS CARDIOLOGY APPT.  OCT. 24.016 / ARRIVE 1:15PM / DR. Engelhard Eschbach. / [address]  / [phone].

## 2015-07-13 NOTE — Assessment & Plan Note (Signed)
-   Referral to Cardiology for consideration of cardiac cath.

## 2015-07-16 ENCOUNTER — Ambulatory Visit: Payer: Self-pay

## 2015-07-18 ENCOUNTER — Telehealth: Payer: Self-pay | Admitting: *Deleted

## 2015-07-18 NOTE — Telephone Encounter (Signed)
Pt walked in to clinic for an appt. Since last visit  07/10/15 having problems with left eye - tunnel vision, if he lightly touches eyelid see stars and any fast motion with turning or sitting /standing - vision goes black. Suggest to go to ER. Pt insists in an appt - Wed 07/23/15 8:15AM Dr Arcelia Jew. No open appt in clinic 07/18/15, 07/21/15 and 07/22/15. Long talk with pt about ER. Hilda Blades Jalisa Sacco RN 07/18/15 11:30AM

## 2015-07-18 NOTE — Progress Notes (Signed)
Internal Medicine Clinic Attending  Case discussed with Dr. Hoffman soon after the resident saw the patient.  We reviewed the resident's history and exam and pertinent patient test results.  I agree with the assessment, diagnosis, and plan of care documented in the resident's note. 

## 2015-07-21 ENCOUNTER — Encounter: Payer: Self-pay | Admitting: Internal Medicine

## 2015-07-21 NOTE — Telephone Encounter (Signed)
Nurse spoke to pt. °

## 2015-07-23 ENCOUNTER — Ambulatory Visit (INDEPENDENT_AMBULATORY_CARE_PROVIDER_SITE_OTHER): Payer: Self-pay | Admitting: Internal Medicine

## 2015-07-23 ENCOUNTER — Ambulatory Visit (HOSPITAL_COMMUNITY)
Admission: RE | Admit: 2015-07-23 | Discharge: 2015-07-23 | Disposition: A | Discharge status: Home / Self Care | Payer: Self-pay | Source: Ambulatory Visit | Attending: Internal Medicine | Admitting: Internal Medicine

## 2015-07-23 ENCOUNTER — Encounter: Payer: Self-pay | Admitting: Internal Medicine

## 2015-07-23 VITALS — BP 130/76 | HR 90 | Temp 98.0°F | Ht 70.0 in | Wt 209.8 lb

## 2015-07-23 DIAGNOSIS — I672 Cerebral atherosclerosis: Secondary | ICD-10-CM | POA: Insufficient documentation

## 2015-07-23 DIAGNOSIS — G319 Degenerative disease of nervous system, unspecified: Secondary | ICD-10-CM | POA: Insufficient documentation

## 2015-07-23 DIAGNOSIS — H539 Unspecified visual disturbance: Secondary | ICD-10-CM

## 2015-07-23 DIAGNOSIS — Z8673 Personal history of transient ischemic attack (TIA), and cerebral infarction without residual deficits: Secondary | ICD-10-CM | POA: Insufficient documentation

## 2015-07-23 LAB — CBC WITH DIFFERENTIAL/PLATELET
Basophils Absolute: 0 10*3/uL (ref 0.0–0.1)
Basophils Relative: 0 %
EOS ABS: 0.1 10*3/uL (ref 0.0–0.7)
EOS PCT: 2 %
HCT: 39.7 % (ref 39.0–52.0)
HEMOGLOBIN: 12.7 g/dL — AB (ref 13.0–17.0)
LYMPHS ABS: 2.3 10*3/uL (ref 0.7–4.0)
Lymphocytes Relative: 50 %
MCH: 26.3 pg (ref 26.0–34.0)
MCHC: 32 g/dL (ref 30.0–36.0)
MCV: 82.4 fL (ref 78.0–100.0)
MONOS PCT: 12 %
Monocytes Absolute: 0.5 10*3/uL (ref 0.1–1.0)
Neutro Abs: 1.6 10*3/uL — ABNORMAL LOW (ref 1.7–7.7)
Neutrophils Relative %: 36 %
PLATELETS: 201 10*3/uL (ref 150–400)
RBC: 4.82 MIL/uL (ref 4.22–5.81)
RDW: 16.5 % — ABNORMAL HIGH (ref 11.5–15.5)
WBC: 4.6 10*3/uL (ref 4.0–10.5)

## 2015-07-23 LAB — SEDIMENTATION RATE: SED RATE: 8 mm/h (ref 0–16)

## 2015-07-23 LAB — GLUCOSE, CAPILLARY: Glucose-Capillary: 138 mg/dL — ABNORMAL HIGH (ref 65–99)

## 2015-07-23 LAB — HM DIABETES EYE EXAM

## 2015-07-23 LAB — C-REACTIVE PROTEIN: CRP: <0.5 mg/dL (ref <1.0)

## 2015-07-23 NOTE — Patient Instructions (Signed)
-   CT head negative for new stroke. Only shows your old strokes. - Please go across the street to Dr. Zenia Resides office - I will have you follow up with Dr. Heber [place]  General Instructions:   Please bring your medicines with you each time you come to clinic.  Medicines may include prescription medications, over-the-counter medications, herbal remedies, eye drops, vitamins, or other pills.   Progress Toward Treatment Goals:  Treatment Goal 07/10/2015  Hemoglobin A1C at goal  Blood pressure at goal  Prevent falls improved    Self Care Goals & Plans:  Self Care Goal 07/23/2015  Manage my medications bring my medications to every visit; take my medicines as prescribed; refill my medications on time  Monitor my health keep track of my blood glucose; bring my glucose meter and log to each visit  Eat healthy foods drink diet soda or water instead of juice or soda; eat more vegetables; eat foods that are low in salt; eat baked foods instead of fried foods; eat fruit for snacks and desserts  Be physically active take a walk every day  Meeting treatment goals maintain the current self-care plan    Home Blood Glucose Monitoring 07/10/2015  Check my blood sugar 2 times a day  When to check my blood sugar before breakfast     Care Management & Community Referrals:  Referral 07/10/2015  Referrals made for care management support none needed

## 2015-07-23 NOTE — Assessment & Plan Note (Signed)
Patient's symptoms of acute transient visual changes in setting of prior history of stroke, diabetes, and HTN concerning for possible TIA/stroke vs an ophthalmologic etiology such as retinal detachment. Will send patient for CT head without contrast STAT and send over to opthalmology to be evaluated. - CT Head without contrast>> No acute infarct. Evidence of prior strokes in right cerebellum and left basal ganglia which is consistent with his scans in 2015.  - Immediate evaluation with opthalmology- I spoke with Dr. Katy Fitch on phone and he graciously agreed to see patient today for urgent evaluation. Patient given instructions/directions to report to Dr. Zenia Resides office immediately. - Checking ESR, CRP, CBC per Dr. Zenia Resides recommendations - Will have patient follow up with PCP in a few weeks

## 2015-07-23 NOTE — Progress Notes (Signed)
   Subjective:    Patient ID: Jason Velasquez, male    DOB: 01-29-1956, 59 y.o.   MRN: [id number]  HPI Mr. Oelkers is a 59yo man with PMHx of HTN, CAD s/p CABG, type 2 DM, and previous CVA who presents today for acute vision changes.  He reports about 1 week ago he started to see darkness that would turn into tunnel vision and then slowly come back to normal vision anywhere between 30 seconds to 2 minutes. This first occurred in both eyes for a few days but is now only on the left side. He describes these visual changes happened every 20-30 minutes for the first few days, but now is only occurring if he rubs his left eye. He notes the symptoms were brought on whenever he leaned forward or turned his head too fast as well, but this has resolved. He also describes a pressure in his left eye that started 2 days ago. He denies any associated eye pain, headache, trouble swallowing, dizziness, vertigo, weakness in his extremities, difficulty walking, or changes in speech. He notes the sunlight will sometimes bring on the visual changes as well. He also reports he noticed while driving at night he lost vision in both of his eyes temporarily while looking at the oncoming car's headlights. He reports he was told in the past that he may be developing a cataract.    Review of Systems General: Denies fever, chills, night sweats, changes in weight, changes in appetite HEENT: Denies ear pain, rhinorrhea, sore throat CV: Denies CP, palpitations, SOB, orthopnea Pulm: Denies SOB, cough, wheezing GI: Denies abdominal pain, nausea, vomiting, diarrhea, constipation, melena, hematochezia GU: Denies dysuria, hematuria, frequency Msk: Denies muscle cramps, joint pains Neuro: See HPI Skin: Denies rashes, bruising Psych: Denies depression, anxiety, hallucinations    Objective:   Physical Exam General: alert, sitting up, NAD HEENT: [place]/AT, EOMI, PERRLA. Fundoscopic examination performed and no hemorrhages appreciated.    Neck: supple, no carotid bruits appreciated, no tenderness or limitation in ROM CV: RRR, no m/g/r Pulm: CTA bilaterally, breaths non-labored Ext: warm, no peripheral edema Neuro: alert and oriented x 3. EOMI, PERRLA. Visual fields intact. Smile symmetric. Shoulder shrug intact. Tongue midline. Strength symmetric and 5/5 in both upper and lower extremities. Gait normal.      Assessment & Plan:  Please refer to A&P documentation.

## 2015-07-25 NOTE — Progress Notes (Signed)
Internal Medicine Clinic Attending  Case discussed with Dr. Rivet soon after the resident saw the patient.  We reviewed the resident's history and exam and pertinent patient test results.  I agree with the assessment, diagnosis, and plan of care documented in the resident's note.  

## 2015-07-27 NOTE — Progress Notes (Signed)
Cardiology Office Note   Date:  07/28/2015   ID:  Jason Velasquez, DOB 09/30/1956, MRN [id number]  PCP:  Jason Groves, DO  Cardiologist:   Jason Harness, MD   Chief Complaint  Patient presents with  . New Evaluation    patient reports chest pain with exertion, goes away with rest, pain in the back of legs when walking.      History of Present Illness: Jason Velasquez is a 59 y.o. male with CAD s/p CABG, hypertension, diabetes and CVA who presents for an evaluation of chest pain.  He mentioned to Dr. Heber Velasquez that he had an episode of chest pain when helping his friend move.  He only notices it when he is lifting heavy things or running.  The pain is substernal and does not radiate.  It is 5/10 in severity and lasts for minutes.  He denies associated nausea, shortness of breath or diaphoresis.  This has been onging for the last couple of months.  Jason Velasquez underwent CABG in 2007.  Prior to CABG he had similar symptoms but the pain was sharper and lasted longer.  Jason Velasquez denies lower extremity edema, orthopnea or PND. He also denies lightheadedness, dizziness or palpitations. He likes to walk for exercise most days of the week. He typically walks for 10-15 minutes.  He notes pain in his calves that happens while walking and is alleviated with rest. 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 He reports eating a good diet and avoids fried foods. He likes to eat lots of vegetables and salads. He tries to avoid high-calorie drinks.  Jason Velasquez quit smoking 4 years ago.    Past Medical History  Diagnosis Date  . Hypertension   . Hypercholesterolemia   . CAD (coronary artery disease) of artery bypass graft 06/25/2014    CABG- 2007   . History of CVA (cerebrovascular accident) 07/12/2014  . DKA (diabetic ketoacidoses) ([place]) 12/07/2014  . Controlled type 2 diabetes mellitus without complication ([place]) 10/06/7515    Dx  March of 2016. GAD65 negative Anti Islet cell negative    . CAD (coronary artery disease), native coronary artery 06/25/2014    CABG- 2007   . S/P CABG (coronary artery bypass graft) 03/28/2015    2007 at [facility]   . Stroke ([place])     2015  . Hx of adenomatous colonic polyps 05/27/2015  . Tubular adenoma of colon 05/31/2015    Colonoscopy 05/20/15 with [name]: 3 tubullar adenoma's largest 2.3cm.  Plan for repeat Colonoscopy in 2019.    Past Surgical History  Procedure Laterality Date  . Cardiac surgery    . Coronary artery bypass graft      Current Outpatient Prescriptions  Medication Sig Dispense Refill  . aspirin 81 MG tablet Take 2 pills daily (Patient taking differently: Take 81 mg by mouth daily. Take 2 pills daily) 30 tablet 5  . Blood Gluc Meter Disp-Strips (BLOOD GLUCOSE METER DISPOSABLE) DEVI 1 strip by Does not apply route 4 (four) times daily -  before meals and at bedtime. 50 each 11  . Blood Glucose Monitoring Suppl (RELION PRIME MONITOR) DEVI 1 Device by Does not apply route 4 (four) times daily -  with meals and at bedtime. 1 Device 3  . insulin aspart protamine- aspart (NOVOLOG MIX 70/30) (70-30) 100 UNIT/ML injection Inject 15 units in the morning and  15 units in the evening before meals. 10 mL 0  . Insulin Syringes, Disposable, U-100 0.5 ML MISC 1 applicator by Does not apply route 2 (two) times daily with a meal. 100 each 11  . Lancets MISC 1 Device by Does not apply route 4 (four) times daily -  before meals and at bedtime. 100 each 11  . lisinopril-hydrochlorothiazide (PRINZIDE,ZESTORETIC) 10-12.5 MG per tablet Take 1 tablet by mouth daily.    . metFORMIN (GLUCOPHAGE) 1000 MG tablet Take 1 tablet (1,000 mg total) by mouth 2 (two) times daily with a meal. 60 tablet 11  . rosuvastatin (CRESTOR) 20 MG tablet Take 1 tablet (20 mg total) by mouth at bedtime. 30 tablet 11   No current facility-administered medications for this visit.    Allergies:    Review of patient's allergies indicates no known allergies.    Social History:  The patient  reports that he quit smoking about 5 years ago. He has never used smokeless tobacco. He reports that he does not drink alcohol or use illicit drugs.   Family History:  The patient's family history includes Diabetes in his father; Hypertension in his mother and sister. There is no history of Colon cancer.    ROS:  Please see the history of present illness.   Otherwise, review of systems are positive for none.   All other systems are reviewed and negative.    PHYSICAL EXAM: VS:  BP 124/76 mmHg  Pulse 97  Ht 5' 10.5" (1.791 m)  Wt 94.212 kg (207 lb 11.2 oz)  BMI 29.37 kg/m2 , BMI Body mass index is 29.37 kg/(m^2). GENERAL:  Well appearing HEENT:  Pupils equal round and reactive, fundi not visualized, oral mucosa unremarkable NECK:  No jugular venous distention, waveform within normal limits, carotid upstroke brisk and symmetric, no bruits, no thyromegaly LYMPHATICS:  No cervical adenopathy LUNGS:  Clear to auscultation bilaterally HEART:  RRR.  PMI not displaced or sustained,S1 and S2 within normal limits, no S3, no S4, no clicks, no rubs, no murmurs ABD:  Flat, positive bowel sounds normal in frequency in pitch, no bruits, no rebound, no guarding, no midline pulsatile mass, no hepatomegaly, no splenomegaly EXT:  1+ DP/PT bilaterally, no edema, no cyanosis no clubbing SKIN:  No rashes no nodules NEURO:  Cranial nerves II through XII grossly intact, motor grossly intact throughout PSYCH:  Cognitively intact, oriented to person place and time   EKG:  EKG is ordered today. The ekg ordered today demonstrates sinus tachycardia at 103 bpm.  R axis deviation.  Prior inferior infarct.   Recent Labs: 12/06/2014: ALT 29 12/07/2014: Magnesium 3.4* 01/07/2015: BUN 7; Creat 0.92; Potassium 3.9; Sodium 138 07/23/2015: Hemoglobin 12.7*; Platelets 201    Lipid Panel    Component Value Date/Time   CHOL 129  07/10/2015 1418   CHOL 162 06/25/2014 1102   TRIG 314* 07/10/2015 1418   HDL 21* 07/10/2015 1418   HDL 27* 06/25/2014 1102   CHOLHDL 6.1* 07/10/2015 1418   CHOLHDL 6.0 06/25/2014 1102   VLDL 74* 06/25/2014 1102   LDLCALC 45 07/10/2015 1418   LDLCALC 61 06/25/2014 1102      Wt Readings from Last 3 Encounters:  07/28/15 94.212 kg (207 lb 11.2 oz)  07/23/15 95.165 kg (209 lb 12.8 oz)  07/10/15 95.029 kg (209 lb 8 oz)      ASSESSMENT AND PLAN:  # Chest pain, CAD: Jason Velasquez symptoms are concerning for angina.  We will refer him for exercise Cardiolite.  He is on aspirin and rosuvastatin but not on a beta blocker.  We will likely start this after his stress test.    # Hypertension: BP well-controlled.  Continue lisinopril and HCTZ.  Will likely start a beta blocker post stress, even if HCTZ has be discontinued.  # Hyperlipidemia: LDL at goal.  Triglycerides are elevated.  We discussed the need to increase his physical activity to 30-40 minutes most days per week.  # Claudication: Jason Velasquez reports leg pain with ambulation.  We will get ABIs to rule out out PVD.  Current medicines are reviewed at length with the patient today.  The patient does not have concerns regarding medicines.  The following changes have been made:  no change  Labs/ tests ordered today include:   Orders Placed This Encounter  Procedures  . Myocardial Perfusion Imaging  . EKG 12-Lead     Disposition:   FU with Jason Velasquez C. Oval Linsey, MD in 6 months.    Signed, Jason Harness, MD  07/28/2015 2:29 PM    [place]

## 2015-07-28 ENCOUNTER — Ambulatory Visit (INDEPENDENT_AMBULATORY_CARE_PROVIDER_SITE_OTHER): Payer: Self-pay | Admitting: Cardiovascular Disease

## 2015-07-28 ENCOUNTER — Encounter: Payer: Self-pay | Admitting: Cardiovascular Disease

## 2015-07-28 VITALS — BP 124/76 | HR 97 | Ht 70.5 in | Wt 207.7 lb

## 2015-07-28 DIAGNOSIS — I25709 Atherosclerosis of coronary artery bypass graft(s), unspecified, with unspecified angina pectoris: Secondary | ICD-10-CM

## 2015-07-28 DIAGNOSIS — I10 Essential (primary) hypertension: Secondary | ICD-10-CM

## 2015-07-28 DIAGNOSIS — E785 Hyperlipidemia, unspecified: Secondary | ICD-10-CM

## 2015-07-28 DIAGNOSIS — I739 Peripheral vascular disease, unspecified: Secondary | ICD-10-CM

## 2015-07-28 NOTE — Patient Instructions (Signed)
Medication Instructions:  Dr Oval Linsey has made no changes in your current medications. Please continue medicines per the list provided.  Labwork: NONE  Testing/Procedures: Your physician has requested that you have en exercise stress myoview. For further information please visit [email or web address]. Please follow instruction sheet, as given.  Your physician has requested that you have a lower extremity arterial duplex. This test is an ultrasound of the arteries in the legs. It looks at arterial blood flow in the legs. Allow one hour for Lower Arterial scans. There are no restrictions or special instructions.  Follow-Up: Dr Oval Linsey recommends that you schedule a follow-up appointment in 6 months. You will receive a reminder letter in the mail two months in advance. If you don't receive a letter, please call our office to schedule the follow-up appointment.  If you need a refill on your cardiac medications before your next appointment, please call your pharmacy.

## 2015-08-01 ENCOUNTER — Encounter: Payer: Self-pay | Admitting: *Deleted

## 2015-08-05 ENCOUNTER — Other Ambulatory Visit: Payer: Self-pay | Admitting: Internal Medicine

## 2015-08-07 ENCOUNTER — Telehealth (HOSPITAL_COMMUNITY): Payer: Self-pay

## 2015-08-07 NOTE — Telephone Encounter (Signed)
Encounter complete. 

## 2015-08-11 ENCOUNTER — Encounter: Payer: Self-pay | Admitting: Internal Medicine

## 2015-08-11 DIAGNOSIS — H35033 Hypertensive retinopathy, bilateral: Secondary | ICD-10-CM | POA: Insufficient documentation

## 2015-08-12 ENCOUNTER — Ambulatory Visit (HOSPITAL_COMMUNITY)
Admission: RE | Admit: 2015-08-12 | Discharge: 2015-08-12 | Disposition: A | Discharge status: Home / Self Care | Payer: Self-pay | Source: Ambulatory Visit | Attending: Cardiovascular Disease | Admitting: Cardiovascular Disease

## 2015-08-12 ENCOUNTER — Other Ambulatory Visit: Payer: Self-pay | Admitting: Cardiovascular Disease

## 2015-08-12 DIAGNOSIS — E7800 Pure hypercholesterolemia, unspecified: Secondary | ICD-10-CM | POA: Insufficient documentation

## 2015-08-12 DIAGNOSIS — E119 Type 2 diabetes mellitus without complications: Secondary | ICD-10-CM | POA: Insufficient documentation

## 2015-08-12 DIAGNOSIS — I25709 Atherosclerosis of coronary artery bypass graft(s), unspecified, with unspecified angina pectoris: Secondary | ICD-10-CM

## 2015-08-12 DIAGNOSIS — I739 Peripheral vascular disease, unspecified: Secondary | ICD-10-CM

## 2015-08-12 DIAGNOSIS — I10 Essential (primary) hypertension: Secondary | ICD-10-CM | POA: Insufficient documentation

## 2015-08-12 DIAGNOSIS — I70203 Unspecified atherosclerosis of native arteries of extremities, bilateral legs: Secondary | ICD-10-CM | POA: Insufficient documentation

## 2015-08-12 DIAGNOSIS — I700 Atherosclerosis of aorta: Secondary | ICD-10-CM | POA: Insufficient documentation

## 2015-08-14 ENCOUNTER — Telehealth: Payer: Self-pay | Admitting: *Deleted

## 2015-08-14 NOTE — Telephone Encounter (Signed)
Spoke to patient.  ABI-Result given . Verbalized understanding Appointment schedule 09/02/15 2 pm

## 2015-08-14 NOTE — Telephone Encounter (Signed)
-----   Message from Skeet Latch, MD sent at 08/12/2015  9:37 PM EST ----- ABIs show abnormal blood flow.  Please refer to Dr. Gwenlyn Found.

## 2015-08-19 ENCOUNTER — Telehealth (HOSPITAL_COMMUNITY): Payer: Self-pay

## 2015-08-19 NOTE — Telephone Encounter (Signed)
Encounter complete. 

## 2015-08-21 ENCOUNTER — Encounter: Payer: Self-pay | Admitting: Cardiology

## 2015-08-21 ENCOUNTER — Encounter (HOSPITAL_COMMUNITY): Payer: Self-pay | Admitting: *Deleted

## 2015-08-21 ENCOUNTER — Telehealth: Payer: Self-pay | Admitting: *Deleted

## 2015-08-21 ENCOUNTER — Ambulatory Visit (HOSPITAL_COMMUNITY)
Admission: RE | Admit: 2015-08-21 | Discharge: 2015-08-21 | Disposition: A | Discharge status: Home / Self Care | Payer: Self-pay | Source: Ambulatory Visit | Attending: Cardiology | Admitting: Cardiology

## 2015-08-21 DIAGNOSIS — R9439 Abnormal result of other cardiovascular function study: Secondary | ICD-10-CM | POA: Insufficient documentation

## 2015-08-21 DIAGNOSIS — Z01818 Encounter for other preprocedural examination: Secondary | ICD-10-CM

## 2015-08-21 DIAGNOSIS — I25709 Atherosclerosis of coronary artery bypass graft(s), unspecified, with unspecified angina pectoris: Secondary | ICD-10-CM

## 2015-08-21 DIAGNOSIS — Z87891 Personal history of nicotine dependence: Secondary | ICD-10-CM | POA: Insufficient documentation

## 2015-08-21 DIAGNOSIS — E663 Overweight: Secondary | ICD-10-CM | POA: Insufficient documentation

## 2015-08-21 DIAGNOSIS — E119 Type 2 diabetes mellitus without complications: Secondary | ICD-10-CM | POA: Insufficient documentation

## 2015-08-21 DIAGNOSIS — Z6829 Body mass index (BMI) 29.0-29.9, adult: Secondary | ICD-10-CM | POA: Insufficient documentation

## 2015-08-21 DIAGNOSIS — D689 Coagulation defect, unspecified: Secondary | ICD-10-CM

## 2015-08-21 DIAGNOSIS — I739 Peripheral vascular disease, unspecified: Secondary | ICD-10-CM | POA: Insufficient documentation

## 2015-08-21 DIAGNOSIS — R079 Chest pain, unspecified: Secondary | ICD-10-CM | POA: Insufficient documentation

## 2015-08-21 DIAGNOSIS — I10 Essential (primary) hypertension: Secondary | ICD-10-CM | POA: Insufficient documentation

## 2015-08-21 LAB — MYOCARDIAL PERFUSION IMAGING
CHL CUP NUCLEAR SDS: 2
CHL CUP NUCLEAR SRS: 0
CHL CUP NUCLEAR SSS: 2
CSEPPHR: 104 {beats}/min
LV dias vol: 82 mL
LVSYSVOL: 35 mL
Rest HR: 86 {beats}/min
TID: 1.29

## 2015-08-21 MED ORDER — AMINOPHYLLINE 25 MG/ML IV SOLN
75.0000 mg | Freq: Once | INTRAVENOUS | Status: AC
  Administered 2015-08-21: 75 mg via INTRAVENOUS

## 2015-08-21 MED ORDER — REGADENOSON 0.4 MG/5ML IV SOLN
0.4000 mg | Freq: Once | INTRAVENOUS | Status: AC
  Administered 2015-08-21: 0.4 mg via INTRAVENOUS

## 2015-08-21 MED ORDER — TECHNETIUM TC 99M SESTAMIBI GENERIC - CARDIOLITE
30.8000 | Freq: Once | INTRAVENOUS | Status: AC | PRN
  Administered 2015-08-21: 30.8 via INTRAVENOUS

## 2015-08-21 MED ORDER — TECHNETIUM TC 99M SESTAMIBI GENERIC - CARDIOLITE
10.9000 | Freq: Once | INTRAVENOUS | Status: AC | PRN
  Administered 2015-08-21: 10.9 via INTRAVENOUS

## 2015-08-21 NOTE — Telephone Encounter (Signed)
Spoke to patient. Result given . Verbalized understanding PATIENT IS IN AGREEMENT TO PROCEED WITH CARDIAC CATH. LABS WORK NEED WILL BE DONE Monday 08/25/15, UNABLE TO PRIOR THAT TIME. DISCUSS NPO,WHAT MEDICATION TO TAKE. PATIENT AWARE SCHEDULER WILL CONTACT HIM.

## 2015-08-21 NOTE — Progress Notes (Signed)
Dr Martinique reviewed [name]. [name] discharge pt home.[name] A

## 2015-08-21 NOTE — Telephone Encounter (Signed)
-----   Message from Skeet Latch, MD sent at 08/21/2015  1:46 PM EST ----- Stress test is abnormal.  Jason Velasquez is not getting enough blood flow through his coronary arteries.  Please schedule for LHC asap.

## 2015-08-21 NOTE — Telephone Encounter (Signed)
Spoke with patient regarding time and date for heart cath.  Scheduled for 08/26/15 @ 7:30 am---arrive at short stay at 5:30 am---NPO after midnight except for morning meds.---Last dose of Metformin will be Sunday 08/24/15.  Patient to have labs Monday 08/25/15---patient voiced his understanding.  He will pick up instruction letter on Monday when he comes for labs.

## 2015-08-24 DIAGNOSIS — I209 Angina pectoris, unspecified: Secondary | ICD-10-CM | POA: Diagnosis present

## 2015-08-24 HISTORY — DX: Angina pectoris, unspecified: I20.9

## 2015-08-25 ENCOUNTER — Other Ambulatory Visit: Payer: Self-pay | Admitting: *Deleted

## 2015-08-25 ENCOUNTER — Other Ambulatory Visit: Payer: Self-pay | Admitting: Cardiovascular Disease

## 2015-08-25 DIAGNOSIS — Z01818 Encounter for other preprocedural examination: Secondary | ICD-10-CM

## 2015-08-25 LAB — APTT: aPTT: 32 seconds (ref 24–37)

## 2015-08-25 LAB — PROTIME-INR
INR: 0.92 (ref <1.50)
Prothrombin Time: 12.5 seconds (ref 11.6–15.2)

## 2015-08-26 ENCOUNTER — Encounter (HOSPITAL_COMMUNITY)
Admission: RE | Disposition: A | Discharge status: Home / Self Care | Payer: Self-pay | Source: Ambulatory Visit | Attending: Cardiology

## 2015-08-26 ENCOUNTER — Ambulatory Visit (HOSPITAL_COMMUNITY)
Admission: RE | Admit: 2015-08-26 | Discharge: 2015-08-26 | Disposition: A | Discharge status: Home / Self Care | Payer: Self-pay | Source: Ambulatory Visit | Attending: Cardiology | Admitting: Cardiology

## 2015-08-26 DIAGNOSIS — I10 Essential (primary) hypertension: Secondary | ICD-10-CM | POA: Diagnosis present

## 2015-08-26 DIAGNOSIS — Z7982 Long term (current) use of aspirin: Secondary | ICD-10-CM | POA: Insufficient documentation

## 2015-08-26 DIAGNOSIS — Z794 Long term (current) use of insulin: Secondary | ICD-10-CM | POA: Insufficient documentation

## 2015-08-26 DIAGNOSIS — E7800 Pure hypercholesterolemia, unspecified: Secondary | ICD-10-CM | POA: Insufficient documentation

## 2015-08-26 DIAGNOSIS — Z951 Presence of aortocoronary bypass graft: Secondary | ICD-10-CM

## 2015-08-26 DIAGNOSIS — E11319 Type 2 diabetes mellitus with unspecified diabetic retinopathy without macular edema: Secondary | ICD-10-CM

## 2015-08-26 DIAGNOSIS — I25119 Atherosclerotic heart disease of native coronary artery with unspecified angina pectoris: Secondary | ICD-10-CM

## 2015-08-26 DIAGNOSIS — I209 Angina pectoris, unspecified: Secondary | ICD-10-CM | POA: Diagnosis present

## 2015-08-26 DIAGNOSIS — Z7984 Long term (current) use of oral hypoglycemic drugs: Secondary | ICD-10-CM | POA: Insufficient documentation

## 2015-08-26 DIAGNOSIS — E1151 Type 2 diabetes mellitus with diabetic peripheral angiopathy without gangrene: Secondary | ICD-10-CM

## 2015-08-26 DIAGNOSIS — Z87891 Personal history of nicotine dependence: Secondary | ICD-10-CM | POA: Insufficient documentation

## 2015-08-26 DIAGNOSIS — I2582 Chronic total occlusion of coronary artery: Secondary | ICD-10-CM | POA: Insufficient documentation

## 2015-08-26 DIAGNOSIS — E119 Type 2 diabetes mellitus without complications: Secondary | ICD-10-CM | POA: Insufficient documentation

## 2015-08-26 DIAGNOSIS — Z8673 Personal history of transient ischemic attack (TIA), and cerebral infarction without residual deficits: Secondary | ICD-10-CM

## 2015-08-26 DIAGNOSIS — I25810 Atherosclerosis of coronary artery bypass graft(s) without angina pectoris: Secondary | ICD-10-CM | POA: Insufficient documentation

## 2015-08-26 DIAGNOSIS — Z01818 Encounter for other preprocedural examination: Secondary | ICD-10-CM

## 2015-08-26 HISTORY — PX: CARDIAC CATHETERIZATION: SHX172

## 2015-08-26 LAB — CBC
HEMATOCRIT: 39.4 % (ref 39.0–52.0)
HEMOGLOBIN: 13.1 g/dL (ref 13.0–17.0)
MCH: 26.4 pg (ref 26.0–34.0)
MCHC: 33.2 g/dL (ref 30.0–36.0)
MCV: 79.4 fL (ref 78.0–100.0)
MPV: 8.5 fL — ABNORMAL LOW (ref 8.6–12.4)
Platelets: 216 10*3/uL (ref 150–400)
RBC: 4.96 MIL/uL (ref 4.22–5.81)
RDW: 17.4 % — ABNORMAL HIGH (ref 11.5–15.5)
WBC: 4.8 10*3/uL (ref 4.0–10.5)

## 2015-08-26 LAB — BASIC METABOLIC PANEL
BUN: 14 mg/dL (ref 7–25)
CALCIUM: 9.3 mg/dL (ref 8.6–10.3)
CHLORIDE: 102 mmol/L (ref 98–110)
CO2: 27 mmol/L (ref 20–31)
CREATININE: 0.99 mg/dL (ref 0.70–1.25)
GLUCOSE: 106 mg/dL — AB (ref 65–99)
Potassium: 4.6 mmol/L (ref 3.5–5.3)
Sodium: 136 mmol/L (ref 135–146)

## 2015-08-26 LAB — GLUCOSE, CAPILLARY
GLUCOSE-CAPILLARY: 131 mg/dL — AB (ref 65–99)
GLUCOSE-CAPILLARY: 141 mg/dL — AB (ref 65–99)
Glucose-Capillary: 107 mg/dL — ABNORMAL HIGH (ref 65–99)

## 2015-08-26 LAB — PROTIME-INR
INR: 1 (ref 0.00–1.49)
PROTHROMBIN TIME: 13.4 s (ref 11.6–15.2)

## 2015-08-26 SURGERY — LEFT HEART CATH AND CORS/GRAFTS ANGIOGRAPHY
Anesthesia: LOCAL

## 2015-08-26 MED ORDER — FENTANYL CITRATE (PF) 100 MCG/2ML IJ SOLN
INTRAMUSCULAR | Status: AC
  Filled 2015-08-26: qty 2

## 2015-08-26 MED ORDER — SODIUM CHLORIDE 0.9 % IJ SOLN: 3.0000 mL | Freq: Two times a day (BID) | INTRAMUSCULAR | Status: DC

## 2015-08-26 MED ORDER — FENTANYL CITRATE (PF) 100 MCG/2ML IJ SOLN
INTRAMUSCULAR | Status: AC
Start: 2015-08-26 — End: 2015-08-26
  Filled 2015-08-26: qty 2

## 2015-08-26 MED ORDER — ONDANSETRON HCL 4 MG/2ML IJ SOLN: 4.0000 mg | Freq: Four times a day (QID) | INTRAMUSCULAR | Status: DC | PRN

## 2015-08-26 MED ORDER — LIDOCAINE HCL (PF) 1 % IJ SOLN
INTRAMUSCULAR | Status: AC
  Filled 2015-08-26: qty 30

## 2015-08-26 MED ORDER — MIDAZOLAM HCL 2 MG/2ML IJ SOLN
INTRAMUSCULAR | Status: DC | PRN
  Administered 2015-08-26 (×2): 1 mg via INTRAVENOUS

## 2015-08-26 MED ORDER — SODIUM CHLORIDE 0.9 % IJ SOLN: 3.0000 mL | INTRAMUSCULAR | Status: DC | PRN

## 2015-08-26 MED ORDER — MIDAZOLAM HCL 2 MG/2ML IJ SOLN
INTRAMUSCULAR | Status: AC
  Filled 2015-08-26: qty 2

## 2015-08-26 MED ORDER — HEPARIN (PORCINE) IN NACL 2-0.9 UNIT/ML-% IJ SOLN
INTRAMUSCULAR | Status: AC
  Filled 2015-08-26: qty 1000

## 2015-08-26 MED ORDER — MORPHINE SULFATE (PF) 2 MG/ML IV SOLN: 2.0000 mg | INTRAVENOUS | Status: DC | PRN

## 2015-08-26 MED ORDER — RANOLAZINE ER 500 MG PO TB12: 500.0000 mg | ORAL_TABLET | Freq: Two times a day (BID) | ORAL | Status: DC

## 2015-08-26 MED ORDER — IOHEXOL 350 MG/ML SOLN
INTRAVENOUS | Status: DC | PRN
  Administered 2015-08-26: 110 mL via INTRA_ARTERIAL

## 2015-08-26 MED ORDER — ACETAMINOPHEN 325 MG PO TABS: 650.0000 mg | ORAL_TABLET | ORAL | Status: DC | PRN

## 2015-08-26 MED ORDER — SODIUM CHLORIDE 0.9 % WEIGHT BASED INFUSION: 3.0000 mL/kg/h | INTRAVENOUS | Status: AC

## 2015-08-26 MED ORDER — ASPIRIN 81 MG PO CHEW
CHEWABLE_TABLET | ORAL | Status: AC
  Administered 2015-08-26: 81 mg
  Filled 2015-08-26: qty 1

## 2015-08-26 MED ORDER — CARVEDILOL 3.125 MG PO TABS: 3.1250 mg | ORAL_TABLET | Freq: Two times a day (BID) | ORAL | Status: DC

## 2015-08-26 MED ORDER — SODIUM CHLORIDE 0.9 % IV SOLN
INTRAVENOUS | Status: DC
  Administered 2015-08-26: 07:00:00 via INTRAVENOUS

## 2015-08-26 MED ORDER — SODIUM CHLORIDE 0.9 % IV SOLN: 250.0000 mL | INTRAVENOUS | Status: DC | PRN

## 2015-08-26 MED ORDER — FENTANYL CITRATE (PF) 100 MCG/2ML IJ SOLN
INTRAMUSCULAR | Status: DC | PRN
  Administered 2015-08-26 (×2): 25 ug via INTRAVENOUS

## 2015-08-26 SURGICAL SUPPLY — 13 items
CATH EXPO 5F MPA-1 (CATHETERS) ×1 IMPLANT
CATH INFINITI 5 FR RCB (CATHETERS) ×1 IMPLANT
CATH INFINITI 5FR AL1 (CATHETERS) ×1 IMPLANT
CATH INFINITI 5FR MULTPACK ANG (CATHETERS) ×1 IMPLANT
KIT HEART LEFT (KITS) ×2 IMPLANT
PACK CARDIAC CATHETERIZATION (CUSTOM PROCEDURE TRAY) ×2 IMPLANT
SHEATH PINNACLE 5F 10CM (SHEATH) ×1 IMPLANT
SYR MEDRAD MARK V 150ML (SYRINGE) ×2 IMPLANT
TRANSDUCER W/STOPCOCK (MISCELLANEOUS) ×2 IMPLANT
TUBING CIL FLEX 10 FLL-RA (TUBING) ×2 IMPLANT
WIRE EMERALD 3MM-J .035X150CM (WIRE) ×1 IMPLANT
WIRE HI TORQ VERSACORE-J 145CM (WIRE) ×1 IMPLANT
WIRE SAFE-T 1.5MM-J .035X260CM (WIRE) ×2 IMPLANT

## 2015-08-26 NOTE — Progress Notes (Signed)
Site area: rt groin fa sheath pulled and pressure held by [employer] Prior to Removal:  Level 0 Pressure Applied For:  20 minutes Manual:   yes Patient Status During Pull:  stable Post Pull Site:  Level  0 Post Pull Instructions Given:  stable Post Pull Pulses Present: yes Dressing Applied:  tegaderm  Bedrest begins @ 0930 Comments:  0

## 2015-08-26 NOTE — Progress Notes (Signed)
Pt states he is still waiting for his wife to get off work. She gets off at 2000. Stable.  Pt given a dinner tray.

## 2015-08-26 NOTE — Progress Notes (Signed)
Pt given information on both new Rx ordered by Dr. Ellyn Hack and teaching completed.  Pt given work note and told he could return to work on Monday but not before.  Pt verbalizes understanding and denies further questions.

## 2015-08-26 NOTE — Progress Notes (Signed)
pts ride did not show up at 2000 as planned. House supervisor was called at 2100 and pt will be given a cab voucher and taken to ED to wait for cab.

## 2015-08-26 NOTE — Interval H&P Note (Signed)
History and Physical Interval Note:  08/26/2015 6:57 AM  Jason Velasquez  has presented today for surgery, with the diagnosis of CP consistent with Class II-III Angina, Abnormal Nuclear Stress Test & Prior CAD-CABGx3.    The various methods of treatment have been discussed with the patient and family. After consideration of risks, benefits and other options for treatment, the patient has consented to  Procedure(s): Left Heart Cath and Cors/Grafts Angiography (N/A) with possible Percutaneous Coronary Intervention  as a surgical intervention .   The patient's history has been reviewed, patient examined, no change in status, stable for surgery.  I have reviewed the patient's chart and labs.  Questions were answered to the patient's satisfaction.    Cath Lab Visit (complete for each Cath Lab visit)  Clinical Evaluation Leading to the Procedure:   ACS: No.  Non-ACS:    Anginal Classification: CCS II  Anti-ischemic medical therapy: Minimal Therapy (1 class of medications)  Non-Invasive Test Results: High-risk stress test findings: cardiac mortality >3%/year  Prior CABG: Previous CABG  Ischemic Symptoms? CCS II (Slight limitation of ordinary activity) Anti-ischemic Medical Therapy? Minimal Therapy (1 class of medications) Non-invasive Test Results? High-risk stress test findings: cardiac mortality >3%/yr Prior CABG? Previous CABG   Patient Information:   >=1 SVG stenosis  A (7)  Indication: 54; Score: 7   Patient Information:   All bypass grafts patent, >=1 lesion(s) in native coronaries without bypass grafts  A (7)  Indication: 54; Score: 7   Patient Information:   Native 3V-CAD, failure of multiple grafts, depressed LVEF, patent LIMA graft PCI  U (6)  Indication: 68; Score: 6   Patient Information:   Native 3V-CAD, failure of multiple grafts, depressed LVEF, patent LIMA graft CABG  A (7)  Indication: 68; Score: 7   Patient Information:   Native 3V-CAD,  failure of multiple grafts, depressed LVEF, nonfunctional LIMA graft PCI  A (8)  Indication: 69; Score: 8   Patient Information:   Native 3V-CAD, failure of multiple grafts, depressed LVEF, nonfunctional LIMA graft CABG  U (6)  Indication: 69; Score: 6     HARDING, DAVID W

## 2015-08-26 NOTE — Discharge Instructions (Signed)
Angiogram, Care After °Refer to this sheet in the next few weeks. These instructions provide you with information about caring for yourself after your procedure. Your health care provider may also give you more specific instructions. Your treatment has been planned according to current medical practices, but problems sometimes occur. Call your health care provider if you have any problems or questions after your procedure. °WHAT TO EXPECT AFTER THE PROCEDURE °After your procedure, it is typical to have the following: °· Bruising at the catheter insertion site that usually fades within 1-2 weeks. °· Blood collecting in the tissue (hematoma) that may be painful to the touch. It should usually decrease in size and tenderness within 1-2 weeks. °HOME CARE INSTRUCTIONS °· Take medicines only as directed by your health care provider. °· You may shower 24-48 hours after the procedure or as directed by your health care provider. Remove the bandage (dressing) and gently wash the site with plain soap and water. Pat the area dry with a clean towel. Do not rub the site, because this may cause bleeding. °· Do not take baths, swim, or use a hot tub until your health care provider approves. °· Check your insertion site every day for redness, swelling, or drainage. °· Do not apply powder or lotion to the site. °· Do not lift over 10 lb (4.5 kg) for 5 days after your procedure or as directed by your health care provider. °· Ask your health care provider when it is okay to: °¨ Return to work or school. °¨ Resume usual physical activities or sports. °¨ Resume sexual activity. °· Do not drive home if you are discharged the same day as the procedure. Have someone else drive you. °· You may drive 24 hours after the procedure unless otherwise instructed by your health care provider. °· Do not operate machinery or power tools for 24 hours after the procedure or as directed by your health care provider. °· If your procedure was done as an  outpatient procedure, which means that you went home the same day as your procedure, a responsible adult should be with you for the first 24 hours after you arrive home. °· Keep all follow-up visits as directed by your health care provider. This is important. °SEEK MEDICAL CARE IF: °· You have a fever. °· You have chills. °· You have increased bleeding from the catheter insertion site. Hold pressure on the site and call 911. °SEEK IMMEDIATE MEDICAL CARE IF: °· You have unusual pain at the catheter insertion site. °· You have redness, warmth, or swelling at the catheter insertion site. °· You have drainage (other than a small amount of blood on the dressing) from the catheter insertion site. °· The catheter insertion site is bleeding, and the bleeding does not stop after 30 minutes of holding steady pressure on the site. °· The area near or just beyond the catheter insertion site becomes pale, cool, tingly, or numb. °  °This information is not intended to replace advice given to you by your health care provider. Make sure you discuss any questions you have with your health care provider. °  °Document Released: 04/08/2005 Document Revised: 10/11/2014 Document Reviewed: 02/21/2013 °Elsevier Interactive Patient Education ©2016 [employer] ° °

## 2015-08-26 NOTE — H&P (View-Only) (Signed)
  Cardiology Office Note   Date:  07/28/2015   ID:  Jason Velasquez, DOB 03/02/1956, MRN [id number]  PCP:  Hoffman, Erik C, DO  Cardiologist:   [place], Keene Gilkey P, MD   Chief Complaint  Patient presents with  . New Evaluation    patient reports chest pain with exertion, goes away with rest, pain in the back of legs when walking.      History of Present Illness: Jason Velasquez is a 59 y.o. male with CAD s/p CABG, hypertension, diabetes and CVA who presents for an evaluation of chest pain.  He mentioned to Dr. Hoffman that he had an episode of chest pain when helping his friend move.  He only notices it when he is lifting heavy things or running.  The pain is substernal and does not radiate.  It is 5/10 in severity and lasts for minutes.  He denies associated nausea, shortness of breath or diaphoresis.  This has been onging for the last couple of months.  [name] underwent CABG in 2007.  Prior to CABG he had similar symptoms but the pain was sharper and lasted longer.  Mr. Bhargava denies lower extremity edema, orthopnea or PND. He also denies lightheadedness, dizziness or palpitations. He likes to walk for exercise most days of the week. He typically walks for 10-15 minutes.  He notes pain in his calves that happens while walking and is alleviated with rest. 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 He reports eating a good diet and avoids fried foods. He likes to eat lots of vegetables and salads. He tries to avoid high-calorie drinks.  Mr. Smyser quit smoking 4 years ago.    Past Medical History  Diagnosis Date  . Hypertension   . Hypercholesterolemia   . CAD (coronary artery disease) of artery bypass graft 06/25/2014    CABG- 2007   . History of CVA (cerebrovascular accident) 07/12/2014  . DKA (diabetic ketoacidoses) ([facility]) 12/07/2014  . Controlled type 2 diabetes mellitus without complication ([facility]) 12/07/2014    Dx  March of 2016. GAD65 negative Anti Islet cell negative    . CAD (coronary artery disease), native coronary artery 06/25/2014    CABG- 2007   . S/P CABG (coronary artery bypass graft) 03/28/2015    2007 at [facility]   . Stroke ([facility])     2015  . Hx of adenomatous colonic polyps 05/27/2015  . Tubular adenoma of colon 05/31/2015    Colonoscopy 05/20/15 with Dr Gessner: 3 tubullar adenoma's largest 2.3cm.  Plan for repeat Colonoscopy in 2019.    Past Surgical History  Procedure Laterality Date  . Cardiac surgery    . Coronary artery bypass graft      Current Outpatient Prescriptions  Medication Sig Dispense Refill  . aspirin 81 MG tablet Take 2 pills daily (Patient taking differently: Take 81 mg by mouth daily. Take 2 pills daily) 30 tablet 5  . Blood Gluc Meter Disp-Strips (BLOOD GLUCOSE METER DISPOSABLE) DEVI 1 strip by Does not apply route 4 (four) times daily -  before meals and at bedtime. 50 each 11  . Blood Glucose Monitoring Suppl (RELION PRIME MONITOR) DEVI 1 Device by Does not apply route 4 (four) times daily -  with meals and at bedtime. 1 Device 3  . insulin aspart protamine- aspart (NOVOLOG MIX 70/30) (70-30) 100 UNIT/ML injection Inject 15 units in the morning and   15 units in the evening before meals. 10 mL 0  . Insulin Syringes, Disposable, U-100 0.5 ML MISC 1 applicator by Does not apply route 2 (two) times daily with a meal. 100 each 11  . Lancets MISC 1 Device by Does not apply route 4 (four) times daily -  before meals and at bedtime. 100 each 11  . lisinopril-hydrochlorothiazide (PRINZIDE,ZESTORETIC) 10-12.5 MG per tablet Take 1 tablet by mouth daily.    . metFORMIN (GLUCOPHAGE) 1000 MG tablet Take 1 tablet (1,000 mg total) by mouth 2 (two) times daily with a meal. 60 tablet 11  . rosuvastatin (CRESTOR) 20 MG tablet Take 1 tablet (20 mg total) by mouth at bedtime. 30 tablet 11   No current facility-administered medications for this visit.    Allergies:    Review of patient's allergies indicates no known allergies.    Social History:  The patient  reports that he quit smoking about 5 years ago. He has never used smokeless tobacco. He reports that he does not drink alcohol or use illicit drugs.   Family History:  The patient's family history includes Diabetes in his father; Hypertension in his mother and sister. There is no history of Colon cancer.    ROS:  Please see the history of present illness.   Otherwise, review of systems are positive for none.   All other systems are reviewed and negative.    PHYSICAL EXAM: VS:  BP 124/76 mmHg  Pulse 97  Ht 5' 10.5" (1.791 m)  Wt 94.212 kg (207 lb 11.2 oz)  BMI 29.37 kg/m2 , BMI Body mass index is 29.37 kg/(m^2). GENERAL:  Well appearing HEENT:  Pupils equal round and reactive, fundi not visualized, oral mucosa unremarkable NECK:  No jugular venous distention, waveform within normal limits, carotid upstroke brisk and symmetric, no bruits, no thyromegaly LYMPHATICS:  No cervical adenopathy LUNGS:  Clear to auscultation bilaterally HEART:  RRR.  PMI not displaced or sustained,S1 and S2 within normal limits, no S3, no S4, no clicks, no rubs, no murmurs ABD:  Flat, positive bowel sounds normal in frequency in pitch, no bruits, no rebound, no guarding, no midline pulsatile mass, no hepatomegaly, no splenomegaly EXT:  1+ DP/PT bilaterally, no edema, no cyanosis no clubbing SKIN:  No rashes no nodules NEURO:  Cranial nerves II through XII grossly intact, motor grossly intact throughout PSYCH:  Cognitively intact, oriented to person place and time   EKG:  EKG is ordered today. The ekg ordered today demonstrates sinus tachycardia at 103 bpm.  R axis deviation.  Prior inferior infarct.   Recent Labs: 12/06/2014: ALT 29 12/07/2014: Magnesium 3.4* 01/07/2015: BUN 7; Creat 0.92; Potassium 3.9; Sodium 138 07/23/2015: Hemoglobin 12.7*; Platelets 201    Lipid Panel    Component Value Date/Time   CHOL 129  07/10/2015 1418   CHOL 162 06/25/2014 1102   TRIG 314* 07/10/2015 1418   HDL 21* 07/10/2015 1418   HDL 27* 06/25/2014 1102   CHOLHDL 6.1* 07/10/2015 1418   CHOLHDL 6.0 06/25/2014 1102   VLDL 74* 06/25/2014 1102   LDLCALC 45 07/10/2015 1418   LDLCALC 61 06/25/2014 1102      Wt Readings from Last 3 Encounters:  07/28/15 94.212 kg (207 lb 11.2 oz)  07/23/15 95.165 kg (209 lb 12.8 oz)  07/10/15 95.029 kg (209 lb 8 oz)      ASSESSMENT AND PLAN:  # Chest pain, CAD: Mr. Rudzinski's symptoms are concerning for angina.  We will refer him for exercise Cardiolite.    He is on aspirin and rosuvastatin but not on a beta blocker.  We will likely start this after his stress test.    # Hypertension: BP well-controlled.  Continue lisinopril and HCTZ.  Will likely start a beta blocker post stress, even if HCTZ has be discontinued.  # Hyperlipidemia: LDL at goal.  Triglycerides are elevated.  We discussed the need to increase his physical activity to 30-40 minutes most days per week.  # Claudication: Mr. Hamman reports leg pain with ambulation.  We will get ABIs to rule out out PVD.  Current medicines are reviewed at length with the patient today.  The patient does not have concerns regarding medicines.  The following changes have been made:  no change  Labs/ tests ordered today include:   Orders Placed This Encounter  Procedures  . Myocardial Perfusion Imaging  . EKG 12-Lead     Disposition:   FU with Shahzain Kiester C. [place], MD in 6 months.    Signed, [place], Ladale Sherburn P, MD  07/28/2015 2:29 PM    [facility]  

## 2015-08-27 ENCOUNTER — Encounter (HOSPITAL_COMMUNITY): Payer: Self-pay | Admitting: Cardiology

## 2015-08-27 MED FILL — Lidocaine HCl Local Preservative Free (PF) Inj 1%: INTRAMUSCULAR | Qty: 30 | Status: AC

## 2015-08-27 MED FILL — Heparin Sodium (Porcine) 2 Unit/ML in Sodium Chloride 0.9%: INTRAMUSCULAR | Qty: 1000 | Status: AC

## 2015-09-02 ENCOUNTER — Ambulatory Visit (INDEPENDENT_AMBULATORY_CARE_PROVIDER_SITE_OTHER): Payer: Self-pay | Admitting: Cardiovascular Disease

## 2015-09-02 ENCOUNTER — Encounter: Payer: Self-pay | Admitting: Cardiovascular Disease

## 2015-09-02 VITALS — BP 98/70 | HR 98 | Ht 70.0 in | Wt 212.0 lb

## 2015-09-02 DIAGNOSIS — I739 Peripheral vascular disease, unspecified: Secondary | ICD-10-CM

## 2015-09-02 NOTE — Progress Notes (Signed)
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 Because of chest pain and a positive Myoview stress test he underwent cardiac catheterization by Dr. Ellyn Hack revealing occluded graft to the PDA and circumflex complex marginal branches. Medical therapy is recommended. His other problems include hypertension, diabetes and hyperlipidemia. He does have bilateral lower extending claudication is fairly reproducible so lobectomy Dopplers performed recently showed ABIs in the 0.8 range with tibial disease on the left and no other focal lesions.   Current Outpatient Prescriptions  Medication Sig Dispense Refill  . aspirin 81 MG tablet Take 2 pills daily (Patient taking differently: Take 81 mg by mouth daily. Take 2 pills daily) 30 tablet 5  . Blood Gluc Meter Disp-Strips (BLOOD GLUCOSE METER DISPOSABLE) DEVI 1 strip by Does not apply route 4 (four) times daily -  before meals and at bedtime. 50 each 11  . Blood Glucose Monitoring Suppl (RELION PRIME MONITOR) DEVI 1 Device by Does not apply route 4 (four) times daily -  with meals and at bedtime. 1 Device 3  . carvedilol (COREG) 3.125 MG tablet Take 1 tablet (3.125 mg total) by mouth 2 (two) times daily with a meal. 60 tablet 3  . insulin aspart protamine- aspart (NOVOLOG MIX 70/30) (70-30) 100 UNIT/ML injection Inject 15 units in the morning and 15 units in the evening before meals. 10 mL 0  . Insulin Syringes, Disposable, U-100 0.5 ML MISC 1 applicator by Does not apply route 2 (two) times daily with a meal. 100 each 11  . Lancets MISC 1 Device by Does not apply route 4 (four) times daily -  before meals and at bedtime. 100 each 11  . lisinopril-hydrochlorothiazide (PRINZIDE,ZESTORETIC)  10-12.5 MG tablet TAKE ONE TABLET BY MOUTH  DAILY 30 tablet 11  . metFORMIN (GLUCOPHAGE) 1000 MG tablet Take 1 tablet (1,000 mg total) by mouth 2 (two) times daily with a meal. 60 tablet 11  . ranolazine (RANEXA) 500 MG 12 hr tablet Take 1 tablet (500 mg total) by mouth 2 (two) times daily. 60 tablet 3  . rosuvastatin (CRESTOR) 20 MG tablet Take 1 tablet (20 mg total) by mouth at bedtime. 30 tablet 11   No current facility-administered medications for this visit.    No Known Allergies  Social History   Social History  . Marital Status: Divorced    Spouse Name: N/A  . Number of Children: N/A  . Years of Education: 13   Occupational History  .  Unemployed   Social History Main Topics  . Smoking status: Former Smoker    Quit date: 10/04/2009  . Smokeless tobacco: Never Used  . Alcohol Use: No  . Drug Use: No  . Sexual Activity: Not on file   Other Topics Concern  . Not on file   Social History Narrative   Epworth Sleepiness Scale = 7 (as of 07/28/2015)     Review of Systems: General: negative for chills, fever, night sweats or weight changes.  Cardiovascular: negative for chest pain, dyspnea on exertion, edema, orthopnea, palpitations, paroxysmal nocturnal dyspnea or shortness of breath Dermatological: negative for rash Respiratory: negative for cough or wheezing Urologic: negative for hematuria Abdominal: negative for nausea, vomiting, diarrhea, bright red blood per rectum, melena, or hematemesis  Neurologic: negative for visual changes, syncope, or dizziness All other systems reviewed and are otherwise negative except as noted above.    Blood pressure 98/70, pulse 98, height 5\' 10"  (1.778 m), weight 212 lb (96.163 kg).  General appearance: alert and no distress Neck: no adenopathy, no carotid bruit, no JVD, supple, symmetrical, trachea midline and thyroid not enlarged, symmetric, no tenderness/mass/nodules Lungs: clear to auscultation bilaterally Heart: regular rate  and rhythm, S1, S2 normal, no murmur, click, rub or gallop Extremities: extremities normal, atraumatic, no cyanosis or edema  EKG not performed today  ASSESSMENT AND PLAN:   Claudication [facility]) Mr. Garone was referred for evaluation of claudication. He has risk factors including hypertension, diabetes and hyperlipidemia. He's had coronary artery bypass grafting. He complains of bilateral calf claudication. Recent lower extremity until Doppler studies performed 08/12/15 revealed a right ABI of 0.81 and a left upper canine. There was tibial vessel disease on the left but no high-grade focal obstructive lesions to explain his symptoms.      Lorretta Harp MD FACP,FACC,FAHA, [facility] 09/02/2015 2:47 PM

## 2015-09-02 NOTE — Patient Instructions (Signed)
Medication Instructions:  Your physician recommends that you continue on your current medications as directed. Please refer to the Current Medication list given to you today.   Labwork: none  Testing/Procedures: none  Follow-Up: Follow up with Dr. Berry as needed.   Any Other Special Instructions Will Be Listed Below (If Applicable).     If you need a refill on your cardiac medications before your next appointment, please call your pharmacy.   

## 2015-09-02 NOTE — Assessment & Plan Note (Signed)
Jason Velasquez was referred for evaluation of claudication. He has risk factors including hypertension, diabetes and hyperlipidemia. He's had coronary artery bypass grafting. He complains of bilateral calf claudication. Recent lower extremity until Doppler studies performed 08/12/15 revealed a right ABI of 0.81 and a left upper canine. There was tibial vessel disease on the left but no high-grade focal obstructive lesions to explain his symptoms.

## 2015-09-19 ENCOUNTER — Ambulatory Visit: Payer: Self-pay | Admitting: Cardiovascular Disease

## 2015-09-19 ENCOUNTER — Telehealth: Payer: Self-pay | Admitting: Cardiovascular Disease

## 2015-09-19 NOTE — Telephone Encounter (Signed)
Spoke to patient   patient states he can not afford Ranexa it will cost $300  no insurance - he has the orange card-[facility] (it does not cover medications)  Patient states he has not started the medications. RN informed patient will have see if samples are available and patient can fill out patient assistance form supply of Ranexa Left message for representative to cal back Will contact patient

## 2015-09-19 NOTE — Telephone Encounter (Signed)
Jason Velasquez is calling because the Ranexa is too expensive and he cannot afford that medication and wanted to know if there is something else he can take . He states that he is still having some chest pains and unable to sleep. Also wants to know if we have some Ranexa samples. Please Call    Thanks

## 2015-09-22 NOTE — Telephone Encounter (Signed)
Called and spoke to patient. Informed him ranexa samples available and have been put at front desk. I also explained patient assistance form. Per pharm rep, pt should submit application Jan 1st & not before - o/w would need to do application for [id number] as well as 2017. He has been given 1 month of samples & I communicated recommendations regarding application submission. Pt may bring this back to [place] so we can fax it for him.  He has been given recommendation to call if any concern about SEs/new problems when starting med. Pt voiced thanks and understanding of instructions.

## 2015-10-03 ENCOUNTER — Encounter: Payer: Self-pay | Admitting: Cardiovascular Disease

## 2015-10-03 ENCOUNTER — Ambulatory Visit (INDEPENDENT_AMBULATORY_CARE_PROVIDER_SITE_OTHER): Payer: Self-pay | Admitting: Cardiovascular Disease

## 2015-10-03 VITALS — BP 106/70 | HR 96 | Ht 70.0 in | Wt 210.0 lb

## 2015-10-03 DIAGNOSIS — I2583 Coronary atherosclerosis due to lipid rich plaque: Secondary | ICD-10-CM

## 2015-10-03 DIAGNOSIS — I2510 Atherosclerotic heart disease of native coronary artery without angina pectoris: Secondary | ICD-10-CM

## 2015-10-03 DIAGNOSIS — I739 Peripheral vascular disease, unspecified: Secondary | ICD-10-CM

## 2015-10-03 DIAGNOSIS — E785 Hyperlipidemia, unspecified: Secondary | ICD-10-CM

## 2015-10-03 DIAGNOSIS — I119 Hypertensive heart disease without heart failure: Secondary | ICD-10-CM

## 2015-10-03 MED ORDER — LISINOPRIL 10 MG PO TABS: 10.0000 mg | ORAL_TABLET | Freq: Every day | ORAL | Status: DC

## 2015-10-03 MED ORDER — RANOLAZINE ER 1000 MG PO TB12: 1000.0000 mg | ORAL_TABLET | Freq: Two times a day (BID) | ORAL | Status: DC

## 2015-10-03 NOTE — Patient Instructions (Signed)
Dr Oval Linsey has recommended making the following medication changes: STOP Lisinopril HCT START Lisinopril 10 mg - take 1 tablet by mouth daily INCREASE Ranexa to 1000 mg - take 1 tablet by mouth twice daily  >>You may take 2 of the 500 mg tablets twice daily until they run out  Your physician recommends that you schedule a follow-up appointment in 2 months.  If you need a refill on your cardiac medications before your next appointment, please call your pharmacy.

## 2015-10-03 NOTE — Progress Notes (Signed)
Cardiology Office Note   Date:  10/05/2015   ID:  Jason Velasquez, DOB 06/17/1956, MRN [id number]  PCP:  Jason Groves, DO  Cardiologist:   Sharol Harness, MD   Chief Complaint  Patient presents with  . Follow-up  . Dizziness  . Chest Pain     Patient ID: Jason Velasquez is a 59 y.o. male with CAD s/p CABG, hypertension, diabetes and CVA who presents for an evaluation of chest pain.    Interval History 10/03/15: After his last appointment Jason Velasquez underwent nuclear stress testing that revealed ischemia in the inferoseptal, inferior and anterior regions.  He was referred for cardiac catheterization that revealed severe 3 vessel disease with occlusion of all his grafts with the exception of a patent LIMA to the LAD.  There were no optimal PCI targets.  He was started on carvedilol and Ranexa. Jason Velasquez also underwent ABI testing that revealed diffuse atherosclerosis from the proximal abdominal aorta through the common and external iliac arteries bilaterally.  There was no detectable flow in the L peroneal or anterior tibial arteries.  ABIs were near 0.8 bilaterally.  He was referred to Dr. Gwenlyn Velasquez where medical management was advised.  [name] has been unable to afford Ranexa.  He was able to obtain samples and will enroll in the patient assistance program in January.  Overall he has been feeling well.  However, he notes fatigue and chest tightness when exerting himself.  Jason Velasquez recently started a part time job at [employer].   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  The chest pain lasts for minutes if he stops and rests.  It is associated with shortness of breath but no nausea, diaphoresis or radiation. He also reports bilateral lower extremity claudication with ambulation.  It is worse when he tries to walk quickly and improves with rest.  Mr. Arnwine denies lower extremity edema, orthopnea, or PND.   History of Present Illness  07/27/15: He mentioned to Dr. Heber Vinton that he had an episode of chest pain when helping his friend move.  He only notices it when he is lifting heavy things or running.  The pain is substernal and does not radiate.  It is 5/10 in severity and lasts for minutes.  He denies associated nausea, shortness of breath or diaphoresis.  This has been onging for the last couple of months.  Jason Velasquez underwent CABG in 2007.  Prior to CABG he had similar symptoms but the pain was sharper and lasted longer.  Jason Velasquez denies lower extremity edema, orthopnea or PND. He also denies lightheadedness, dizziness or palpitations. He likes to walk for exercise most days of the week. He typically walks for 10-15 minutes.  He notes pain in his calves that happens while walking and is alleviated with rest. 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 He reports eating a good diet and avoids fried foods. He likes to eat lots of vegetables and salads. He tries to avoid high-calorie drinks.  Jason Velasquez quit smoking 4 years ago.    Past Medical History  Diagnosis Date  . Hypertension   . Hypercholesterolemia   . CAD (coronary artery disease) of artery bypass graft 06/25/2014    CABG- 2007   . History of CVA (cerebrovascular accident) 07/12/2014  . DKA (diabetic ketoacidoses) ([place]) 12/07/2014  . Controlled type  2 diabetes mellitus without complication ([place]) 123XX123    Dx March of 2016. GAD65 negative Anti Islet cell negative    . CAD (coronary artery disease), native coronary artery 06/25/2014    CABG- 2007   . S/P CABG (coronary artery bypass graft) 03/28/2015    2007 at [place] [facility]   . Stroke ([place])     2015  . Hx of adenomatous colonic polyps 05/27/2015  . Tubular adenoma of colon 05/31/2015    Colonoscopy 05/20/15 with [name]: 3 tubullar adenoma's largest 2.3cm.  Plan for repeat Colonoscopy in 2019.  . Claudication [facility])     Past  Surgical History  Procedure Laterality Date  . Cardiac surgery    . Coronary artery bypass graft    . Cardiac catheterization N/A 08/26/2015    Procedure: Left Heart Cath and Cors/Grafts Angiography;  Surgeon: [name], MD;  Location: [facility];  Service: Cardiovascular;  Laterality: N/A;    Current Outpatient Prescriptions  Medication Sig Dispense Refill  . aspirin 81 MG tablet Take 2 pills daily (Patient taking differently: Take 81 mg by mouth daily. Take 2 pills daily) 30 tablet 5  . Blood Gluc Meter Disp-Strips (BLOOD GLUCOSE METER DISPOSABLE) DEVI 1 strip by Does not apply route 4 (four) times daily -  before meals and at bedtime. 50 each 11  . Blood Glucose Monitoring Suppl (RELION PRIME MONITOR) DEVI 1 Device by Does not apply route 4 (four) times daily -  with meals and at bedtime. 1 Device 3  . carvedilol (COREG) 3.125 MG tablet Take 1 tablet (3.125 mg total) by mouth 2 (two) times daily with a meal. 60 tablet 3  . insulin aspart protamine- aspart (NOVOLOG MIX 70/30) (70-30) 100 UNIT/ML injection Inject 15 units in the morning and 15 units in the evening before meals. 10 mL 0  . Insulin Syringes, Disposable, U-100 0.5 ML MISC 1 applicator by Does not apply route 2 (two) times daily with a meal. 100 each 11  . Lancets MISC 1 Device by Does not apply route 4 (four) times daily -  before meals and at bedtime. 100 each 11  . metFORMIN (GLUCOPHAGE) 1000 MG tablet Take 1 tablet (1,000 mg total) by mouth 2 (two) times daily with a meal. 60 tablet 11  . ranolazine (RANEXA) 1000 MG SR tablet Take 1 tablet (1,000 mg total) by mouth 2 (two) times daily. 60 tablet 11  . rosuvastatin (CRESTOR) 20 MG tablet Take 1 tablet (20 mg total) by mouth at bedtime. 30 tablet 11  . lisinopril (PRINIVIL,ZESTRIL) 10 MG tablet Take 1 tablet (10 mg total) by mouth daily. 30 tablet 11   No current facility-administered medications for this visit.    Allergies:   Review of patient's allergies  indicates no known allergies.    Social History:  The patient  reports that he quit smoking about 6 years ago. He has never used smokeless tobacco. He reports that he does not drink alcohol or use illicit drugs.   Family History:  The patient's family history includes Diabetes in his father; Hypertension in his mother and sister. There is no history of Colon cancer.    ROS:  Please see the history of present illness.   Otherwise, review of systems are positive for none.   All other systems are reviewed and negative.    PHYSICAL EXAM: VS:  BP 106/70 mmHg  Pulse 96  Ht 5\' 10"  (1.778 m)  Wt 95.255 kg (210 lb)  BMI 30.13 kg/m2 , BMI Body mass index is 30.13 kg/(m^2). GENERAL:  Well appearing HEENT:  Pupils equal round and reactive, fundi not visualized, oral mucosa unremarkable NECK:  No jugular venous distention, waveform within normal limits, carotid upstroke brisk and symmetric, no bruits, no thyromegaly LYMPHATICS:  No cervical adenopathy LUNGS:  Clear to auscultation bilaterally HEART:  RRR.  PMI not displaced or sustained,S1 and S2 within normal limits, no S3, no S4, no clicks, no rubs, no murmurs ABD:  Flat, positive bowel sounds normal in frequency in pitch, no bruits, no rebound, no guarding, no midline pulsatile mass, no hepatomegaly, no splenomegaly EXT:  1+ DP/PT bilaterally, no edema, no cyanosis no clubbing SKIN:  No rashes no nodules NEURO:  Cranial nerves II through XII grossly intact, motor grossly intact throughout PSYCH:  Cognitively intact, oriented to person place and time   EKG:  EKG is ordered today. The ekg ordered today demonstrates sinus tachycardia at 103 bpm.  R axis deviation.  Prior inferior infarct.  Lexiscan Cardiolite 08/21/15:  Nuclear stress EF: 57%.  The left ventricular ejection fraction is normal (55-65%).  There was no ST segment deviation noted during stress.  No T wave inversion was noted during stress.  Defect 1: There is a small defect  of moderate severity present in the basal inferoseptal and basal inferior location.  Defect 2: There is a defect present in the basal anterior, mid anterior and apical anterior location.  Findings consistent with ischemia.  This is a high risk study.  High risk stress nuclear study with two areas of ischemia and normal left ventricular regional and global systolic function. The inferior area of ischemia is small. The anterior area of ischemia is extensive, but appears to be very mild. The septum is spared. LHC 08/26/15:  LM lesion, 90% stenosed. Involving both LAD and circumflex ostia  Prox RCA lesion, 90% stenosed. Mid RCA lesion, 90% stenosed. Dist RCA lesion, 100% stenosed.  SVG -(presumably to distal RCA) was not visualized . Likely flush occluded  Ost LAD lesion, 80% stenosed.  Ost Cx lesion, 90% stenosed. Prox Cx lesion, 80% stenosed.  Ost 1st Mrg to 1st Mrg lesion, 85% stenosed.  SVG-OM1 Prox Graft to Dist Graft lesion, 100% stenosed. Short stump noted at the ostium as well as at insertion site, this would suggest chronic occlusion  Mid Cx lesion, 95% stenosed. 3rd Mrg lesion, 100% stenosed.  Ost LAD to Mid LAD lesion, dilated, ectatic disease prior to 100% occlusion.  LIMA-LAD was injected is normal in caliber, widely patent to a wraparound LAD   Severe native vessel disease including left main stenosis with occluded RCA and LAD. Also subtotal occluded OM1 and total occluded OM 3. It is not clear what the grafts went to but both vein grafts are occluded.  LIMA-LAD is widely patent.  No optimal PCI targets. We'll need to review with interventional colleagues to determine if left main, circumflex, OM1 PCI is a viable option. OM1 is less than 2 mm in diameter, and therefore not an optimal target.  He will be discharged after TR band removal.  Recommendations:  Initiate antianginal therapy. I will start carvedilol 3.125 mg twice a day as well as Ranexa 500 mg twice a  day.  Follow-up with Dr. Oval Linsey for additional titration of antianginal therapy.  Recent Labs: 12/06/2014: ALT 29 12/07/2014: Magnesium 3.4* 08/25/2015: BUN 14; Creat 0.99; Hemoglobin 13.1; Platelets 216; Potassium 4.6; Sodium 136    Lipid Panel    Component Value Date/Time  CHOL 129 07/10/2015 1418   CHOL 162 06/25/2014 1102   TRIG 314* 07/10/2015 1418   HDL 21* 07/10/2015 1418   HDL 27* 06/25/2014 1102   CHOLHDL 6.1* 07/10/2015 1418   CHOLHDL 6.0 06/25/2014 1102   VLDL 74* 06/25/2014 1102   LDLCALC 45 07/10/2015 1418   LDLCALC 61 06/25/2014 1102      Wt Readings from Last 3 Encounters:  10/03/15 95.255 kg (210 lb)  09/02/15 96.163 kg (212 lb)  08/26/15 93.441 kg (206 lb)      ASSESSMENT AND PLAN:  # CAD: Mr. Rousselle has severe 3 vessel CAD and all his CABG grafts are occluded with the exception of his LIMA to the LAD.  He continues to have exertional symptoms despite adding carvedilol to his regimen.  His BP is too low to titrate carvedilol or lisinopril.  We will increase Ranexa to 1000 mg bid.    # Hypertension: BP well-controlled.  Continue carvedilol and lisinopril.    # Hyperlipidemia: LDL 45.  Triglycerides are elevated.  We discussed the need to increase his physical activity to 30-40 minutes most days per week.  Continue rosuvastatin.  # Claudication: ABIs 0.8 bilaterally.  No intervention per Dr. Gwenlyn Velasquez.  Increase exercise as above.  Continue aspirin and rosuvastatin    Current medicines are reviewed at length with the patient today.  The patient does not have concerns regarding medicines.  The following changes have been made:  no change  Labs/ tests ordered today include:   No orders of the defined types were placed in this encounter.     Disposition:   FU with Kenyonna Micek C. Oval Linsey, MD in 2 months.    Signed, Sharol Harness, MD  10/05/2015 8:02 PM    [place]

## 2015-10-05 ENCOUNTER — Encounter: Payer: Self-pay | Admitting: Cardiovascular Disease

## 2015-11-11 ENCOUNTER — Telehealth: Payer: Self-pay | Admitting: Cardiovascular Disease

## 2015-11-11 NOTE — Telephone Encounter (Signed)
Medication Samples have been provided to the patient.  Drug name: [place] Mode: # 56  LOT: [id number]  Exp.Date: 11/2018  The patient has been instructed regarding the correct time, dose, and frequency of taking this medication, including desired effects and most common side effects.   Called patient left samples at front desk  Kathy Breach 3:16 PM 11/11/2015

## 2015-11-11 NOTE — Telephone Encounter (Signed)
Patient calling the office for samples of medication: ° ° °1.  What medication and dosage are you requesting samples for? Ranexa  ° °2.  Are you currently out of this medication? Yes  ° ° °

## 2015-11-20 ENCOUNTER — Ambulatory Visit (INDEPENDENT_AMBULATORY_CARE_PROVIDER_SITE_OTHER): Payer: Self-pay | Admitting: Internal Medicine

## 2015-11-20 ENCOUNTER — Encounter: Payer: Self-pay | Admitting: Internal Medicine

## 2015-11-20 VITALS — BP 103/70 | HR 100 | Temp 97.5°F | Ht 70.5 in | Wt 210.1 lb

## 2015-11-20 DIAGNOSIS — E119 Type 2 diabetes mellitus without complications: Secondary | ICD-10-CM

## 2015-11-20 DIAGNOSIS — E1151 Type 2 diabetes mellitus with diabetic peripheral angiopathy without gangrene: Secondary | ICD-10-CM

## 2015-11-20 DIAGNOSIS — I25119 Atherosclerotic heart disease of native coronary artery with unspecified angina pectoris: Secondary | ICD-10-CM

## 2015-11-20 DIAGNOSIS — Z7984 Long term (current) use of oral hypoglycemic drugs: Secondary | ICD-10-CM

## 2015-11-20 DIAGNOSIS — Z7982 Long term (current) use of aspirin: Secondary | ICD-10-CM

## 2015-11-20 DIAGNOSIS — Z794 Long term (current) use of insulin: Secondary | ICD-10-CM

## 2015-11-20 DIAGNOSIS — I739 Peripheral vascular disease, unspecified: Secondary | ICD-10-CM

## 2015-11-20 DIAGNOSIS — I10 Essential (primary) hypertension: Secondary | ICD-10-CM

## 2015-11-20 LAB — GLUCOSE, CAPILLARY: Glucose-Capillary: 83 mg/dL (ref 65–99)

## 2015-11-20 LAB — POCT GLYCOSYLATED HEMOGLOBIN (HGB A1C): Hemoglobin A1C: 6.7

## 2015-11-20 MED ORDER — LISINOPRIL 10 MG PO TABS: 10.0000 mg | ORAL_TABLET | Freq: Every day | ORAL | Status: DC

## 2015-11-20 MED ORDER — CARVEDILOL 6.25 MG PO TABS: 6.2500 mg | ORAL_TABLET | Freq: Two times a day (BID) | ORAL | Status: DC

## 2015-11-20 NOTE — Progress Notes (Signed)
[facility] Subjective:   Patient ID: Jason Velasquez male   DOB: [birth year] 60 y.o.   MRN: [id number]  HPI: [name] is a 60 y.o. male with a PMH detailed below who presents for 3 month follow up on DM and HTN  Please see problem based charting below for the status of his chronic medical problems    Past Medical History  Diagnosis Date  . Hypertension   . Hypercholesterolemia   . CAD (coronary artery disease) of artery bypass graft 06/25/2014    CABG- 2007   . History of CVA (cerebrovascular accident) 07/12/2014  . DKA (diabetic ketoacidoses) ([place]) 12/07/2014  . Controlled type 2 diabetes mellitus without complication ([place]) [id number]    Dx March of 2016. GAD65 negative Anti Islet cell negative    . CAD (coronary artery disease), native coronary artery 06/25/2014    CABG- 2007   . 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 Stroke ([place])     2015  . Hx of adenomatous colonic polyps 05/27/2015  . Tubular adenoma of colon 05/31/2015    Colonoscopy 05/20/15 with [name]: 3 tubullar adenoma's largest 2.3cm.  Plan for repeat Colonoscopy in 2019.  . Claudication [facility])    Current Outpatient Prescriptions  Medication Sig Dispense Refill  . aspirin 81 MG tablet Take 2 pills daily (Patient taking differently: Take 81 mg by mouth daily. Take 2 pills daily) 30 tablet 5  . Blood Gluc Meter Disp-Strips (BLOOD GLUCOSE METER DISPOSABLE) DEVI 1 strip by Does not apply route 4 (four) times daily -  before meals and at bedtime. 50 each 11  . Blood Glucose Monitoring Suppl (RELION PRIME MONITOR) DEVI 1 Device by Does not apply route 4 (four) times daily -  with meals and at bedtime. 1 Device 3  . carvedilol (COREG) 6.25 MG tablet Take 1 tablet (6.25 mg total) by mouth 2 (two) times daily with a meal. 60 tablet 11  . insulin aspart protamine- aspart (NOVOLOG MIX 70/30) (70-30) 100 UNIT/ML injection Inject 15 units in the morning and  15 units in the evening before meals. 10 mL 0  . Insulin Syringes, Disposable, U-100 0.5 ML MISC 1 applicator by Does not apply route 2 (two) times daily with a meal. 100 each 11  . Lancets MISC 1 Device by Does not apply route 4 (four) times daily -  before meals and at bedtime. 100 each 11  . lisinopril (PRINIVIL,ZESTRIL) 10 MG tablet Take 1 tablet (10 mg total) by mouth daily. 30 tablet 11  . metFORMIN (GLUCOPHAGE) 1000 MG tablet Take 1 tablet (1,000 mg total) by mouth 2 (two) times daily with a meal. 60 tablet 11  . ranolazine (RANEXA) 1000 MG SR tablet Take 1 tablet (1,000 mg total) by mouth 2 (two) times daily. 60 tablet 11  . rosuvastatin (CRESTOR) 20 MG tablet Take 1 tablet (20 mg total) by mouth at bedtime. 30 tablet 11   No current facility-administered medications for this visit.   Family History  Problem Relation Age of Onset  . Hypertension Mother   . Diabetes Father   . Hypertension Sister   . Colon cancer Neg Hx    Social History   Social History  . Marital Status: Divorced    Spouse Name: N/A  . Number of Children: N/A  . Years of Education: 13   Occupational History  .  Unemployed   Social History Main Topics  .  Smoking status: Former Smoker    Quit date: 10/04/2009  . Smokeless tobacco: Never Used  . Alcohol Use: No  . Drug Use: No  . Sexual Activity: Not Asked   Other Topics Concern  . None   Social History Narrative   Epworth Sleepiness Scale = 7 (as of 07/28/2015)   Review of Systems: Review of Systems  Respiratory: Negative for shortness of breath.   Cardiovascular: Positive for claudication. Negative for chest pain and leg swelling.  Genitourinary: Negative for frequency.  Endo/Heme/Allergies: Negative for polydipsia.     Objective:  Physical Exam: Filed Vitals:   11/20/15 1350  BP: 103/70  Pulse: 100  Temp: 97.5 F (36.4 C)  TempSrc: Oral  Height: 5' 10.5" (1.791 m)  Weight: 210 lb 1.6 oz (95.301 kg)  SpO2: 100%   Physical Exam   Constitutional: He is well-developed, well-nourished, and in no distress.  Cardiovascular: Normal rate and regular rhythm.   Pulses:      Dorsalis pedis pulses are 2+ on the right side, and 2+ on the left side.       Posterior tibial pulses are 2+ on the right side, and 2+ on the left side.  Pulmonary/Chest: Effort normal and breath sounds normal.  Neurological:  Normal monofilament exam  Nursing note and vitals reviewed.   Assessment & Plan:  Case discussed with Dr. [name]  Essential hypertension HPI: He has been taking Coreg 3.125 BID, Lisinopril- HCTZ 10-12.5mg  daily, per notes cardiology had instructed him to D/C HCTZ and switch to lisinopril alone, he has not done this and bring the combination pill with him today.  A: Essential HTN at goal  P: - Instructed to D/C HCTZ - Continue Lisinopril 10mg  daily - HR not at goal given CAD will have him increase Coreg to 6.25 BID, he will follow up with Cardiology in 2 weeks and can be titrated up further if BP allows.  Coronary artery disease involving native coronary artery with angina pectoris [facility]) HPI: He has been following with Dr Oval Linsey since I sent him due to his anginal symptoms.  Repeat Cath in November shows severe multile vessel disease of his graft that is not amiable to intervion and plan is for medical management.  He reports that his chest pain has almostly completely resolved with the addition on ranexa but he has been mainly relying on samples and is concerned about the long term viability of this.  A: CAD  P: - Continue ASA, Lisinopril 10mg  dialy, Crestor 20mg  daily (LDL at goal) and Ranexa 1g BID. I have increased his Coreg to 6.25mg  BID and he will follow up with Dr Oval Linsey in 2 weeks.  Controlled type 2 diabetes mellitus with diabetic peripheral angiopathy without gangrene ([facility]) HPI: He continues to do well with Metformin 1G BID and 15 units of Novolin 70/30.  Review of his glucometer shows most readings in range  with rare out of range number, lowest blood sugar was 67.  A: Controlled Type 2 DM with peripheral vascular disease  P: -Continue metformin 1g BID - Continue Novolin 70/30 15 units BID - We discussed increasing exercise.  Claudication [facility]) HPI: He doppler revealed severe bilateral disease of his lower extremities but not high grade focal lesions.  He has seen Dr Gwenlyn Found but there is no intervenable lesion.  He has had some mild improvement in his symptoms but still complains of claudication when walking long distances.  A: Claudication due to severe peripheral vascular disease  P: Continue medical  management with ASA, Crestor. Discussed increasing exercise.    Medications Ordered Meds ordered this encounter  Medications  . lisinopril (PRINIVIL,ZESTRIL) 10 MG tablet    Sig: Take 1 tablet (10 mg total) by mouth daily.    Dispense:  30 tablet    Refill:  11    D/C lisinopril-HCTZ  . carvedilol (COREG) 6.25 MG tablet    Sig: Take 1 tablet (6.25 mg total) by mouth 2 (two) times daily with a meal.    Dispense:  60 tablet    Refill:  11   Other Orders Orders Placed This Encounter  Procedures  . Glucose, capillary  . POCT HgB A1C (CPT [id number])   Follow Up: Return in about 3 months (around 02/17/2016).

## 2015-11-20 NOTE — Patient Instructions (Signed)
General Instructions:  I want you to stop the Lisinopril-HCTZ combination pill and just start taking Lisinopril 10mg  daily, I want you to increase the Coreg (carvedaliol) to 6.25mg  twice a day.  Please bring your medicines with you each time you come to clinic.  Medicines may include prescription medications, over-the-counter medications, herbal remedies, eye drops, vitamins, or other pills.   Progress Toward Treatment Goals:  Treatment Goal 07/10/2015  Hemoglobin A1C at goal  Blood pressure at goal  Prevent falls improved    Self Care Goals & Plans:  Self Care Goal 07/23/2015  Manage my medications bring my medications to every visit; take my medicines as prescribed; refill my medications on time  Monitor my health keep track of my blood glucose; bring my glucose meter and log to each visit  Eat healthy foods drink diet soda or water instead of juice or soda; eat more vegetables; eat foods that are low in salt; eat baked foods instead of fried foods; eat fruit for snacks and desserts  Be physically active take a walk every day  Meeting treatment goals maintain the current self-care plan    Home Blood Glucose Monitoring 07/10/2015  Check my blood sugar 2 times a day  When to check my blood sugar before breakfast     Care Management & Community Referrals:  Referral 07/10/2015  Referrals made for care management support none needed

## 2015-11-21 NOTE — Assessment & Plan Note (Signed)
HPI: He has been following with Dr Oval Linsey since I sent him due to his anginal symptoms.  Repeat Cath in November shows severe multile vessel disease of his graft that is not amiable to intervion and plan is for medical management.  He reports that his chest pain has almostly completely resolved with the addition on ranexa but he has been mainly relying on samples and is concerned about the long term viability of this.  A: CAD  P: - Continue ASA, Lisinopril 10mg  dialy, Crestor 20mg  daily (LDL at goal) and Ranexa 1g BID. I have increased his Coreg to 6.25mg  BID and he will follow up with Dr Oval Linsey in 2 weeks.

## 2015-11-21 NOTE — Progress Notes (Signed)
Internal Medicine Clinic Attending  Case discussed with Dr. Hoffman soon after the resident saw the patient.  We reviewed the resident's history and exam and pertinent patient test results.  I agree with the assessment, diagnosis, and plan of care documented in the resident's note. 

## 2015-11-21 NOTE — Assessment & Plan Note (Signed)
HPI: He continues to do well with Metformin 1G BID and 15 units of Novolin 70/30.  Review of his glucometer shows most readings in range with rare out of range number, lowest blood sugar was 67.  A: Controlled Type 2 DM with peripheral vascular disease  P: -Continue metformin 1g BID - Continue Novolin 70/30 15 units BID - We discussed increasing exercise.

## 2015-11-21 NOTE — Assessment & Plan Note (Signed)
HPI: He has been taking Coreg 3.125 BID, Lisinopril- HCTZ 10-12.5mg  daily, per notes cardiology had instructed him to D/C HCTZ and switch to lisinopril alone, he has not done this and bring the combination pill with him today.  A: Essential HTN at goal  P: - Instructed to D/C HCTZ - Continue Lisinopril 10mg  daily - HR not at goal given CAD will have him increase Coreg to 6.25 BID, he will follow up with Cardiology in 2 weeks and can be titrated up further if BP allows.

## 2015-11-21 NOTE — Assessment & Plan Note (Signed)
HPI: He doppler revealed severe bilateral disease of his lower extremities but not high grade focal lesions.  He has seen Dr Gwenlyn Found but there is no intervenable lesion.  He has had some mild improvement in his symptoms but still complains of claudication when walking long distances.  A: Claudication due to severe peripheral vascular disease  P: Continue medical management with ASA, Crestor. Discussed increasing exercise.

## 2015-11-24 ENCOUNTER — Telehealth: Payer: Self-pay | Admitting: Cardiovascular Disease

## 2015-11-24 NOTE — Telephone Encounter (Signed)
Pt informed that we do not have any samples of the medications. He takes his last dose tomorrow morning. Pt has sent in the paperwork for the assistance program and has not heard anything back. Pt needs samples to get him to next appt, and cannot afford medication if send in Rx and worried it could change at appt any way. Pt going to call back and keep checking to see if we have samples.

## 2015-11-24 NOTE — Telephone Encounter (Signed)
Patient calling the office for samples of medication:   1.  What medication and dosage are you requesting samples for? Renexa   2.  Are you currently out of this medication? Just enough for today- pt appt on 2/28 w/ Dr Oval Linsey

## 2015-11-28 ENCOUNTER — Other Ambulatory Visit: Payer: Self-pay | Admitting: Cardiovascular Disease

## 2015-11-28 NOTE — Telephone Encounter (Signed)
Medication samples have been provided to the patient.  Drug name: Ranexa  Qty: 28 tablets  LOT: [id number]  Exp.Date: 11/2018  Samples left at front desk for patient pick-up. Patient notified.  Therisa Doyne 2:37 PM 11/28/2015

## 2015-11-28 NOTE — Telephone Encounter (Signed)
Patient calling the office for samples of medication:   1.  What medication and dosage are you requesting samples for?Ranexa    2.  Are you currently out of this medication? YES

## 2015-12-01 NOTE — Progress Notes (Signed)
Cardiology Office Note   Date:  12/02/2015   ID:  Jason Velasquez, DOB 05-Feb-1956, MRN [id number]  PCP:  Jason Groves, DO  Cardiologist:   Jason Harness, MD   Chief Complaint  Patient presents with  . Follow-up    2 months//possible titration of antianginal therapy per Jason Velasquez--CATH//pt c/o mild sharp chest pain last night, no other Sx.     Patient ID: Jason Velasquez is a 60 y.o. male with CAD s/p CABG, hypertension, diabetes and CVA who presents for an evaluation of chest pain.    Interval History 12/02/15: At his last appointment Jason Velasquez's Ranexa was increased to 1000 mg.  Since that time he has been feeling better.  He notes that his chest pain and claudication have both improved.  He is able to walk longer distances without getting pain.  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  They have cut his hours back to one day per week because he has been too slow.  He is otherwise feeling well and very occasionally has chest pain.  He denies lower extremity edema, orthopnea and PND.     Interval History 10/03/15: After his last appointment Jason Velasquez underwent nuclear stress testing that revealed ischemia in the inferoseptal, inferior and anterior regions.  He was referred for cardiac catheterization that revealed severe 3 vessel disease with occlusion of all his grafts with the exception of a patent LIMA to the LAD.  There were no optimal PCI targets.  He was started on carvedilol and Ranexa. Jason Velasquez also underwent ABI testing that revealed diffuse atherosclerosis from the proximal abdominal aorta through the common and external iliac arteries bilaterally.  There was no detectable flow in the L peroneal or anterior tibial arteries.  ABIs were near 0.8 bilaterally.  He was referred to Jason Velasquez where medical management was advised.  Jason Velasquez has been unable to afford Ranexa.  He was able to obtain samples and will  enroll in the patient assistance program in January.  Overall he has been feeling well.  However, he notes fatigue and chest tightness when exerting himself.  Jason Velasquez recently started a part time job at [employer].   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  The chest pain lasts for minutes if he stops and rests.  It is associated with shortness of breath but no nausea, diaphoresis or radiation. He also reports bilateral lower extremity claudication with ambulation.  It is worse when he tries to walk quickly and improves with rest.  Jason Velasquez denies lower extremity edema, orthopnea, or PND.   History of Present Illness 07/27/15: He mentioned to Jason Velasquez that he had an episode of chest pain when helping his friend move.  He only notices it when he is lifting heavy things or running.  The pain is substernal and does not radiate.  It is 5/10 in severity and lasts for minutes.  He denies associated nausea, shortness of breath or diaphoresis.  This has been onging for the last couple of months.  Jason Velasquez underwent CABG in 2007.  Prior to CABG he had similar symptoms but the pain was sharper and lasted longer.  Jason Velasquez denies lower extremity edema, orthopnea or PND. He also denies lightheadedness, dizziness or palpitations. He likes to walk for exercise most days of the week. He typically walks for 10-15  minutes.  He notes pain in his calves that happens while walking and is alleviated with rest. 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 He reports eating a good diet and avoids fried foods. He likes to eat lots of vegetables and salads. He tries to avoid high-calorie drinks.  Jason Velasquez quit smoking 4 years ago.    Past Medical History  Diagnosis Date  . Hypertension   . Hypercholesterolemia   . CAD (coronary artery disease) of artery bypass graft 06/25/2014    CABG- 2007   .  History of CVA (cerebrovascular accident) 07/12/2014  . DKA (diabetic ketoacidoses) ([place]) 12/07/2014  . Controlled type 2 diabetes mellitus without complication ([place]) [id number]    Dx March of 2016. GAD65 negative Anti Islet cell negative    . CAD (coronary artery disease), native coronary artery 06/25/2014    CABG- 2007   . S/P CABG (coronary artery bypass graft) 03/28/2015    2007 at [place] [facility]   . Stroke ([place])     2015  . Hx of adenomatous colonic polyps 05/27/2015  . Tubular adenoma of colon 05/31/2015    Colonoscopy 05/20/15 with [name]: 3 tubullar adenoma's largest 2.3cm.  Plan for repeat Colonoscopy in 2019.  . Claudication [facility])     Past Surgical History  Procedure Laterality Date  . Cardiac surgery    . Coronary artery bypass graft    . Cardiac catheterization N/A 08/26/2015    Procedure: Left Heart Cath and Cors/Grafts Angiography;  Surgeon: [name], MD;  Location: [facility];  Service: Cardiovascular;  Laterality: N/A;    Current Outpatient Prescriptions  Medication Sig Dispense Refill  . aspirin 81 MG tablet Take 2 pills daily (Patient taking differently: Take 81 mg by mouth daily. Take 2 pills daily) 30 tablet 5  . Blood Gluc Meter Disp-Strips (BLOOD GLUCOSE METER DISPOSABLE) DEVI 1 strip by Does not apply route 4 (four) times daily -  before meals and at bedtime. 50 each 11  . Blood Glucose Monitoring Suppl (RELION PRIME MONITOR) DEVI 1 Device by Does not apply route 4 (four) times daily -  with meals and at bedtime. 1 Device 3  . carvedilol (COREG) 6.25 MG tablet Take 1 tablet (6.25 mg total) by mouth 2 (two) times daily with a meal. 60 tablet 11  . insulin aspart protamine- aspart (NOVOLOG MIX 70/30) (70-30) 100 UNIT/ML injection Inject 15 units in the morning and 15 units in the evening before meals. 10 mL 0  . Insulin Syringes, Disposable, U-100 0.5 ML MISC 1 applicator by Does not apply route 2 (two) times daily with a meal. 100 each 11  .  Lancets MISC 1 Device by Does not apply route 4 (four) times daily -  before meals and at bedtime. 100 each 11  . lisinopril (PRINIVIL,ZESTRIL) 10 MG tablet Take 1 tablet (10 mg total) by mouth daily. 30 tablet 11  . metFORMIN (GLUCOPHAGE) 1000 MG tablet Take 1 tablet (1,000 mg total) by mouth 2 (two) times daily with a meal. 60 tablet 11  . ranolazine (RANEXA) 1000 MG SR tablet Take 1 tablet (1,000 mg total) by mouth 2 (two) times daily. 60 tablet 11  . rosuvastatin (CRESTOR) 20 MG tablet Take 1 tablet (20 mg total) by mouth at bedtime. 30 tablet 11   No current facility-administered medications for this visit.    Allergies:  Review of patient's allergies indicates no known allergies.    Social History:  The patient  reports that he quit smoking about 6 years ago. He has never used smokeless tobacco. He reports that he does not drink alcohol or use illicit drugs.   Family History:  The patient's family history includes Diabetes in his father; Hypertension in his mother and sister. There is no history of Colon cancer.    ROS:  Please see the history of present illness.   Otherwise, review of systems are positive for none.   All other systems are reviewed and negative.    PHYSICAL EXAM: VS:  BP 122/78 mmHg  Pulse 82  Ht 5\' 10"  (1.778 m)  Wt 95.21 kg (209 lb 14.4 oz)  BMI 30.12 kg/m2 , BMI Body mass index is 30.12 kg/(m^2). GENERAL:  Well appearing HEENT:  Pupils equal round and reactive, fundi not visualized, oral mucosa unremarkable NECK:  No jugular venous distention, waveform within normal limits, carotid upstroke brisk and symmetric, slight L carotid bruit. LYMPHATICS:  No cervical adenopathy LUNGS:  Clear to auscultation bilaterally HEART:  RRR.  PMI not displaced or sustained,S1 and S2 within normal limits, no S3, no S4, no clicks, no rubs, no murmurs ABD:  Flat, positive bowel sounds normal in frequency in pitch, no bruits, no rebound, no guarding, no midline pulsatile mass, no  hepatomegaly, no splenomegaly EXT:  1+ DP/PT bilaterally, no edema, no cyanosis no clubbing SKIN:  No rashes no nodules NEURO:  Cranial nerves II through XII grossly intact, motor grossly intact throughout PSYCH:  Cognitively intact, oriented to person place and time   EKG:  EKG is ordered today. The ekg ordered today demonstrates sinus rhythm rate 82 bpm.  Prior inferior infarct.   Lexiscan Cardiolite 08/21/15:  Nuclear stress EF: 57%.  The left ventricular ejection fraction is normal (55-65%).  There was no ST segment deviation noted during stress.  No T wave inversion was noted during stress.  Defect 1: There is a small defect of moderate severity present in the basal inferoseptal and basal inferior location.  Defect 2: There is a defect present in the basal anterior, mid anterior and apical anterior location.  Findings consistent with ischemia.  This is a high risk study.  High risk stress nuclear study with two areas of ischemia and normal left ventricular regional and global systolic function. The inferior area of ischemia is small. The anterior area of ischemia is extensive, but appears to be very mild. The septum is spared.  LHC 08/26/15:  LM lesion, 90% stenosed. Involving both LAD and circumflex ostia  Prox RCA lesion, 90% stenosed. Mid RCA lesion, 90% stenosed. Dist RCA lesion, 100% stenosed.  SVG -(presumably to distal RCA) was not visualized . Likely flush occluded  Ost LAD lesion, 80% stenosed.  Ost Cx lesion, 90% stenosed. Prox Cx lesion, 80% stenosed.  Ost 1st Mrg to 1st Mrg lesion, 85% stenosed.  SVG-OM1 Prox Graft to Dist Graft lesion, 100% stenosed. Short stump noted at the ostium as well as at insertion site, this would suggest chronic occlusion  Mid Cx lesion, 95% stenosed. 3rd Mrg lesion, 100% stenosed.  Ost LAD to Mid LAD lesion, dilated, ectatic disease prior to 100% occlusion.  LIMA-LAD was injected is normal in caliber, widely patent to a  wraparound LAD   Severe native vessel disease including left main stenosis with occluded RCA and LAD. Also subtotal occluded OM1 and total occluded OM 3. It is not clear what the grafts  went to but both vein grafts are occluded.  LIMA-LAD is widely patent.  No optimal PCI targets. We'll need to review with interventional colleagues to determine if left main, circumflex, OM1 PCI is a viable option. OM1 is less than 2 mm in diameter, and therefore not an optimal target.  He will be discharged after TR band removal.  Recommendations:  Initiate antianginal therapy. I will start carvedilol 3.125 mg twice a day as well as Ranexa 500 mg twice a day.  Follow-up with Dr. Oval Linsey for additional titration of antianginal therapy.  Recent Labs: 12/06/2014: ALT 29 12/07/2014: Magnesium 3.4* 08/25/2015: BUN 14; Creat 0.99; Hemoglobin 13.1; Platelets 216; Potassium 4.6; Sodium 136    Lipid Panel    Component Value Date/Time   CHOL 129 07/10/2015 1418   CHOL 162 06/25/2014 1102   TRIG 314* 07/10/2015 1418   HDL 21* 07/10/2015 1418   HDL 27* 06/25/2014 1102   CHOLHDL 6.1* 07/10/2015 1418   CHOLHDL 6.0 06/25/2014 1102   VLDL 74* 06/25/2014 1102   LDLCALC 45 07/10/2015 1418   LDLCALC 61 06/25/2014 1102      Wt Readings from Last 3 Encounters:  12/02/15 95.21 kg (209 lb 14.4 oz)  11/20/15 95.301 kg (210 lb 1.6 oz)  10/03/15 95.255 kg (210 lb)      ASSESSMENT AND PLAN:  # CAD: Mr. Honor has severe 3 vessel CAD and all his CABG grafts are occluded with the exception of his LIMA to the LAD.  His angina has been better controlled after adding Ranexa.  He has enrolled in the patient assistance program but has not yet learned whether he is a candidate.  For now we will provide more samples.  Continue carvedilol and lisinopril, aspirin, and rosuvastatin.    # Hypertension: BP well-controlled.  Continue carvedilol and lisinopril.    # Hyperlipidemia: LDL 45.  Continue rosuvastatin.  Mr. Roques  was encouraged to increase his physical activity.  We will repeat his lipids and LFTs at the next appointment.  # Claudication: ABIs 0.8 bilaterally.  No intervention per Jason Velasquez.  Mr. Wittler was instructed to increase his walking regimen.  Continue aspirin and rosuvastatin    # Bruit: We will refer Mr. Picha for carotid ultrasound.  Current medicines are reviewed at length with the patient today.  The patient does not have concerns regarding medicines.  The following changes have been made:  no change  Labs/ tests ordered today include:   Orders Placed This Encounter  Procedures  . EKG 12-Lead     Disposition:   FU with Trygg Mantz C. Oval Linsey, MD in 3 months.    Signed, Jason Harness, MD  12/02/2015 11:07 AM    [place]

## 2015-12-02 ENCOUNTER — Encounter: Payer: Self-pay | Admitting: Cardiovascular Disease

## 2015-12-02 ENCOUNTER — Ambulatory Visit (INDEPENDENT_AMBULATORY_CARE_PROVIDER_SITE_OTHER): Payer: Self-pay | Admitting: Cardiovascular Disease

## 2015-12-02 VITALS — BP 122/78 | HR 82 | Ht 70.0 in | Wt 209.9 lb

## 2015-12-02 DIAGNOSIS — I110 Hypertensive heart disease with heart failure: Secondary | ICD-10-CM

## 2015-12-02 DIAGNOSIS — I739 Peripheral vascular disease, unspecified: Secondary | ICD-10-CM

## 2015-12-02 DIAGNOSIS — I25119 Atherosclerotic heart disease of native coronary artery with unspecified angina pectoris: Secondary | ICD-10-CM

## 2015-12-02 DIAGNOSIS — R0989 Other specified symptoms and signs involving the circulatory and respiratory systems: Secondary | ICD-10-CM

## 2015-12-02 DIAGNOSIS — E785 Hyperlipidemia, unspecified: Secondary | ICD-10-CM

## 2015-12-02 NOTE — Patient Instructions (Addendum)
Dr Oval Linsey recommends that you continue on your current medications as directed. Please refer to the Current Medication list given to you today.  Your physician has requested that you have a carotid duplex. This test is an ultrasound of the carotid arteries in your neck. It looks at blood flow through these arteries that supply the brain with blood. Allow one hour for this exam. There are no restrictions or special instructions.  Dr Oval Linsey recommends that you schedule a follow-up appointment in 3 months.  If you need a refill on your cardiac medications before your next appointment, please call your pharmacy.  Intermittent Claudication Intermittent claudication is pain in your leg that occurs when you walk or exercise and goes away when you rest. The pain can occur in one or both legs. CAUSES Intermittent claudication is caused by the buildup of plaque within the major arteries in the body (atherosclerosis). The plaque, which makes arteries stiff and narrow, prevents enough blood from reaching your leg muscles. The pain occurs when you walk or exercise because your muscles need more blood when you are moving and exercising. RISK FACTORS Risk factors include:  A family history of atherosclerosis.  A personal history of stroke or heart disease.  Older age.  Being inactive or overweight.  Smoking cigarettes.  Having another health condition such as:  Diabetes.  High blood pressure.  High cholesterol. SIGNS AND SYMPTOMS  Your hip or leg may:   Ache.  Cramp.  Feel tight.  Feel weak.  Feel heavy. Over time, you may feel pain in your calf, thigh, or hip. DIAGNOSIS  Your health care provider may diagnose intermittent claudication based on your symptoms and medical history. Your health care provider may also do tests to learn more about your condition. These may include:  Blood tests.  An ultrasound.  Imaging tests such as angiography, magnetic resonance angiography  (MRA), and computed tomography angiography (CTA). TREATMENT You may be treated for problems such as:  High blood pressure.  High cholesterol.  Diabetes. Other treatments may include:  Lifestyle changes such as:  Starting an exercise program.  Losing weight.  Quitting smoking.  Medicines to help restore blood flow through your legs.  Blood vessel surgery (angioplasty) to restore blood flow if your intermittent claudication is caused by severe peripheral artery disease. HOME CARE INSTRUCTIONS  Manage any other health conditions you have.  Eat a diet low in saturated fats and calories to maintain a healthy weight.  Quit smoking, if you smoke.  Take medicines only as directed by your health care provider.  If your health care provider recommended an exercise program for you, follow it as directed. Your exercise program may involve:  Walking three or more times a week.  Walking until you have certain symptoms of intermittent claudication.  Resting until symptoms go away.  Gradually increasing walking time to about 50 minutes a day. SEEK MEDICAL CARE IF: Your condition is not getting better or is getting worse. SEEK IMMEDIATE MEDICAL CARE IF:   You have chest pain.  You have difficulty breathing.  You develop arm weakness.  You have trouble speaking.  Your face begins to droop. MAKE SURE YOU:  Understand these instructions.  Will watch your condition.  Will get help if you are not doing well or get worse.   This information is not intended to replace advice given to you by your health care provider. Make sure you discuss any questions you have with your health care provider.   Document  Released: 07/23/2004 Document Revised: 10/11/2014 Document Reviewed: 12/27/2013 Elsevier Interactive Patient Education [employer].

## 2015-12-08 ENCOUNTER — Encounter (HOSPITAL_COMMUNITY): Payer: Self-pay

## 2015-12-09 ENCOUNTER — Ambulatory Visit (HOSPITAL_COMMUNITY)
Admission: RE | Admit: 2015-12-09 | Discharge: 2015-12-09 | Disposition: A | Discharge status: Home / Self Care | Payer: Self-pay | Source: Ambulatory Visit | Attending: Cardiovascular Disease | Admitting: Cardiovascular Disease

## 2015-12-09 DIAGNOSIS — R0989 Other specified symptoms and signs involving the circulatory and respiratory systems: Secondary | ICD-10-CM | POA: Insufficient documentation

## 2015-12-09 DIAGNOSIS — I10 Essential (primary) hypertension: Secondary | ICD-10-CM | POA: Insufficient documentation

## 2015-12-09 DIAGNOSIS — I6523 Occlusion and stenosis of bilateral carotid arteries: Secondary | ICD-10-CM

## 2015-12-09 DIAGNOSIS — E119 Type 2 diabetes mellitus without complications: Secondary | ICD-10-CM | POA: Insufficient documentation

## 2015-12-09 DIAGNOSIS — E7800 Pure hypercholesterolemia, unspecified: Secondary | ICD-10-CM | POA: Insufficient documentation

## 2015-12-15 ENCOUNTER — Other Ambulatory Visit: Payer: Self-pay | Admitting: Internal Medicine

## 2015-12-15 DIAGNOSIS — E119 Type 2 diabetes mellitus without complications: Secondary | ICD-10-CM

## 2015-12-15 DIAGNOSIS — Z794 Long term (current) use of insulin: Principal | ICD-10-CM

## 2015-12-15 NOTE — Telephone Encounter (Signed)
NOVOLOG [id number], [facility]

## 2015-12-16 MED ORDER — INSULIN ASPART PROT & ASPART (70-30 MIX) 100 UNIT/ML ~~LOC~~ SUSP: SUBCUTANEOUS | Status: DC

## 2015-12-17 NOTE — Telephone Encounter (Signed)
Called into [facility]

## 2015-12-18 ENCOUNTER — Other Ambulatory Visit: Payer: Self-pay | Admitting: Internal Medicine

## 2015-12-18 NOTE — Telephone Encounter (Signed)
Called [facility], he has refills of lisinopril there, the metformin needed to be transferred [facility], [facility] will [facility]. Pt has refills on lisinopril to last til 11/2016 and metformin til 03/2016

## 2015-12-18 NOTE — Telephone Encounter (Signed)
Pt requesting lisinopril and metformin to be filled @ [facility].

## 2015-12-23 ENCOUNTER — Telehealth: Payer: Self-pay | Admitting: *Deleted

## 2015-12-23 NOTE — Telephone Encounter (Signed)
Pt requested samples of ranexa. Asked if refill was needed, he stated no, he's been supplied w/ samples from our office. He needs the 1,000 mg ranexa, which we are out of. Explained i could offer samples of 500 mg and to take 2 instead of 1 for this strength to meet his dose need. Pt states he has submitted application for [employer] patient assistance enrollment but hasn't heard anything. Advised to follow up on status of this. He is aware samples available at front desk.

## 2015-12-29 ENCOUNTER — Telehealth: Payer: Self-pay | Admitting: Cardiovascular Disease

## 2015-12-29 ENCOUNTER — Other Ambulatory Visit: Payer: Self-pay | Admitting: *Deleted

## 2015-12-29 ENCOUNTER — Other Ambulatory Visit: Payer: Self-pay | Admitting: Cardiology

## 2015-12-29 MED ORDER — RANOLAZINE ER 1000 MG PO TB12: 1000.0000 mg | ORAL_TABLET | Freq: Two times a day (BID) | ORAL | Status: DC

## 2015-12-29 NOTE — Telephone Encounter (Signed)
°  New Prob   Patient calling the office for samples of medication:   1.  What medication and dosage are you requesting samples for? Ranexa   2.  Are you currently out of this medication? Pt has only 4 tabs left

## 2015-12-29 NOTE — Telephone Encounter (Signed)
Rx request sent to pharmacy.  

## 2015-12-29 NOTE — Telephone Encounter (Signed)
NO SAMPLES AVAILABLE ,PATIENT AWARE

## 2015-12-29 NOTE — Telephone Encounter (Signed)
Already spoke with this pt and we do not have samples here at [address], informed pt to call [facility] and see if theyhave samples, will route to [facility] refills.

## 2016-01-01 ENCOUNTER — Telehealth: Payer: Self-pay | Admitting: Cardiovascular Disease

## 2016-01-01 NOTE — Telephone Encounter (Signed)
Notified patient we have no samples currently. LM for drug rep to see if samples can be brought by the office - patient has no insurance and his out of pocket cost was $1700.   Unsure if he will qualify for ranexa connect?

## 2016-01-01 NOTE — Telephone Encounter (Signed)
Have not yet heard from [employer] (rep). Returned call to patient. He states he has already checked with [employer] office for samples.   Jason Velasquez had sent an email and said samples would be here on Monday April 3rd.   Patient would like to see if Dr. Oval Linsey can change his medications due to cost/no insurance.   Message routed.

## 2016-01-01 NOTE — Telephone Encounter (Signed)
Jason Downer, do you know if Mr. Voytko may qualify for Ranexa Connects?  Any ideas on other ways to help him?  He really needs it.

## 2016-01-01 NOTE — Telephone Encounter (Signed)
New message ° ° ° ° ° ° °Patient calling the office for samples of medication: ° ° °1.  What medication and dosage are you requesting samples for? ranexa 1000mg ° °2.  Are you currently out of this medication? yes ° °

## 2016-01-02 NOTE — Telephone Encounter (Signed)
Spoke with patient, he states he filled out Ranexa Connects paperwork in January but never heard back from them.  Will have him come in Monday and fill out again, I will fax from here once Dr. Oval Linsey signs.  Patient agreeable.

## 2016-01-13 ENCOUNTER — Ambulatory Visit: Payer: Self-pay

## 2016-01-14 ENCOUNTER — Other Ambulatory Visit: Payer: Self-pay

## 2016-01-15 MED ORDER — ROSUVASTATIN CALCIUM 20 MG PO TABS: 20.0000 mg | ORAL_TABLET | Freq: Every day | ORAL | Status: DC

## 2016-01-19 NOTE — Telephone Encounter (Signed)
LVM for [facility]

## 2016-02-19 ENCOUNTER — Ambulatory Visit (INDEPENDENT_AMBULATORY_CARE_PROVIDER_SITE_OTHER): Payer: Self-pay | Admitting: Internal Medicine

## 2016-02-19 ENCOUNTER — Encounter: Payer: Self-pay | Admitting: Internal Medicine

## 2016-02-19 VITALS — BP 134/86 | HR 95 | Temp 98.5°F | Ht 70.0 in | Wt 218.8 lb

## 2016-02-19 DIAGNOSIS — Z951 Presence of aortocoronary bypass graft: Secondary | ICD-10-CM

## 2016-02-19 DIAGNOSIS — Z794 Long term (current) use of insulin: Secondary | ICD-10-CM

## 2016-02-19 DIAGNOSIS — R079 Chest pain, unspecified: Secondary | ICD-10-CM

## 2016-02-19 DIAGNOSIS — E1151 Type 2 diabetes mellitus with diabetic peripheral angiopathy without gangrene: Secondary | ICD-10-CM

## 2016-02-19 DIAGNOSIS — I10 Essential (primary) hypertension: Secondary | ICD-10-CM

## 2016-02-19 DIAGNOSIS — I25119 Atherosclerotic heart disease of native coronary artery with unspecified angina pectoris: Secondary | ICD-10-CM

## 2016-02-19 LAB — GLUCOSE, CAPILLARY: GLUCOSE-CAPILLARY: 145 mg/dL — AB (ref 65–99)

## 2016-02-19 MED ORDER — CARVEDILOL 6.25 MG PO TABS: 6.2500 mg | ORAL_TABLET | Freq: Two times a day (BID) | ORAL | Status: DC

## 2016-02-19 NOTE — Progress Notes (Signed)
[facility] Subjective:   Patient ID: Jason Velasquez male   DOB: 1956/07/03 60 y.o.   MRN: [id number]  HPI: [name] is a 60 y.o. male with a PMH detailed below who presents for 3 month follow up of DM, HTN, CAD  Please see problem based charting below for the status of his chronic medical problems.    Past Medical History  Diagnosis Date  . Hypertension   . Hypercholesterolemia   . CAD (coronary artery disease) of artery bypass graft 06/25/2014    CABG- 2007   . History of CVA (cerebrovascular accident) 07/12/2014  . DKA (diabetic ketoacidoses) ([place]) 12/07/2014  . Controlled type 2 diabetes mellitus without complication ([place]) 123XX123    Dx March of 2016. GAD65 negative Anti Islet cell negative    . CAD (coronary artery disease), native coronary artery 06/25/2014    CABG- 2007   . S/P CABG (coronary artery bypass graft) 03/28/2015    2007 at [place] [facility]   . Stroke ([place])     2015  . Hx of adenomatous colonic polyps 05/27/2015  . Tubular adenoma of colon 05/31/2015    Colonoscopy 05/20/15 with [name]: 3 tubullar adenoma's largest 2.3cm.  Plan for repeat Colonoscopy in 2019.  . Claudication [facility])    Current Outpatient Prescriptions  Medication Sig Dispense Refill  . aspirin 81 MG tablet Take 2 pills daily (Patient taking differently: Take 81 mg by mouth daily. Take 2 pills daily) 30 tablet 5  . Blood Gluc Meter Disp-Strips (BLOOD GLUCOSE METER DISPOSABLE) DEVI 1 strip by Does not apply route 4 (four) times daily -  before meals and at bedtime. 50 each 11  . Blood Glucose Monitoring Suppl (RELION PRIME MONITOR) DEVI 1 Device by Does not apply route 4 (four) times daily -  with meals and at bedtime. 1 Device 3  . carvedilol (COREG) 6.25 MG tablet Take 1 tablet (6.25 mg total) by mouth 2 (two) times daily with a meal. 60 tablet 5  . insulin aspart protamine- aspart (NOVOLOG MIX 70/30) (70-30) 100 UNIT/ML injection Inject 15 units in the morning and  15 units in the evening before meals. 10 mL 11  . Insulin Syringes, Disposable, U-100 0.5 ML MISC 1 applicator by Does not apply route 2 (two) times daily with a meal. 100 each 11  . Lancets MISC 1 Device by Does not apply route 4 (four) times daily -  before meals and at bedtime. 100 each 11  . lisinopril (PRINIVIL,ZESTRIL) 10 MG tablet Take 1 tablet (10 mg total) by mouth daily. 30 tablet 11  . metFORMIN (GLUCOPHAGE) 1000 MG tablet Take 1 tablet (1,000 mg total) by mouth 2 (two) times daily with a meal. 60 tablet 11  . ranolazine (RANEXA) 1000 MG SR tablet Take 1 tablet (1,000 mg total) by mouth 2 (two) times daily. 180 tablet 3  . rosuvastatin (CRESTOR) 20 MG tablet Take 1 tablet (20 mg total) by mouth at bedtime. 30 tablet 11   No current facility-administered medications for this visit.   Family History  Problem Relation Age of Onset  . Hypertension Mother   . Diabetes Father   . Hypertension Sister   . Colon cancer Neg Hx    Social History   Social History  . Marital Status: Divorced    Spouse Name: N/A  . Number of Children: N/A  . Years of Education: 13   Occupational History  .  Unemployed   Social History Main Topics  .  Smoking status: Former Smoker    Quit date: 10/04/2009  . Smokeless tobacco: Never Used  . Alcohol Use: No  . Drug Use: No  . Sexual Activity: Not Asked   Other Topics Concern  . None   Social History Narrative   Epworth Sleepiness Scale = 7 (as of 07/28/2015)   Review of Systems: Review of Systems  Constitutional: Negative for fever.  Respiratory: Negative for cough and shortness of breath.   Cardiovascular: Positive for chest pain. Negative for orthopnea, leg swelling and PND.  Gastrointestinal: Negative for heartburn, abdominal pain, diarrhea, constipation, blood in stool and melena.  Genitourinary: Negative for dysuria.  Musculoskeletal: Negative for falls.  Neurological: Negative for dizziness and headaches.  Psychiatric/Behavioral:  Negative for depression.  All other systems reviewed and are negative.    Objective:  Physical Exam: Filed Vitals:   02/19/16 1411  BP: 134/86  Pulse: 95  Temp: 98.5 F (36.9 C)  TempSrc: Oral  Height: 5\' 10"  (1.778 m)  Weight: 218 lb 12.8 oz (99.247 kg)  SpO2: 100%  Physical Exam  Constitutional: He is well-developed, well-nourished, and in no distress.  HENT:  Head: Normocephalic and atraumatic.  Eyes: Conjunctivae are normal.  Cardiovascular: Normal rate, regular rhythm and normal heart sounds.   Pulmonary/Chest: Effort normal and breath sounds normal.  Abdominal: Soft. Bowel sounds are normal.  Musculoskeletal: He exhibits no edema.  Skin: Skin is warm and dry.  Psychiatric: Affect normal.  Nursing note and vitals reviewed.   Assessment & Plan:  Case discussed with Dr. [name]  Essential hypertension HPI: Taking BP medications as directed. Does report a nagging cough especially at night.  A: Essential HTN  P: I discussed that the lisinopril may be causing his cough however that it is a very good medicaiton for his heart and blood pressure, I did discuss the option to change to an ARB, hwoever he reports that he is not that bothered by the cough and would like to stay on lisinopril - I have increased his Coreg to 6.25 BID for anti-anginal therapy - If he cannot get ranexa from his cardiologist I am considering starting him on IMDUR 30mg  dialy   Coronary artery disease involving native coronary artery with angina pectoris (HCC) HPI: He has been following up with Dr Oval Linsey and reports that initially his chest pain did very well with the addition of ranexa. However he has run out of samples and has not yet been able to get on an assistance program.  He plans to have someone help with his taxes so that he can do this and tells me that he is also going to ask the cardiologist for more samples of ranexa. 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  A: CAD s/p CABG with stable angina  P: HR 95 today, will increase Coreg to 6.25 BID Continue ASA, Crestor, Lisinopril. It would be great if he can get ranexa, however if not I will start him on IMDUR 30 mg daily as this is a 4 dollar medication and his BP seems like it will tolerate the addition of a nitrate.   Controlled type 2 diabetes mellitus with diabetic peripheral angiopathy without gangrene (HCC) HPI: He has been takign 70/30 insulin 15 units BID without issue in addition to metformin 1g BID.  No issues with this, feels sugars have been well  controlled. I was unable to download his metter but review shows readingss from 84-209 with most readings in 140s-160s.  A: Controlled Type 2 DM with peripheral vascular disease  P: Continue current medications    Medications Ordered Meds ordered this encounter  Medications  . carvedilol (COREG) 6.25 MG tablet    Sig: Take 1 tablet (6.25 mg total) by mouth 2 (two) times daily with a meal.    Dispense:  60 tablet    Refill:  5   Other Orders Orders Placed This Encounter  Procedures  . Glucose, capillary   Follow Up: Return in about 3 months (around 05/21/2016).

## 2016-02-19 NOTE — Patient Instructions (Signed)
I want you to watch what you eat, remember 3500 calories equals one pound.  Try to keep your total calorie intake at about 1500 calories a day.  I want you to increase your Coreg to 6.25mg  a day.

## 2016-02-20 NOTE — Assessment & Plan Note (Signed)
HPI: He has been following up with Dr Oval Linsey and reports that initially his chest pain did very well with the addition of ranexa. However he has run out of samples and has not yet been able to get on an assistance program.  He plans to have someone help with his taxes so that he can do this and tells me that he is also going to ask the cardiologist for more samples of ranexa. 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  A: CAD s/p CABG with stable angina  P: HR 95 today, will increase Coreg to 6.25 BID Continue ASA, Crestor, Lisinopril. It would be great if he can get ranexa, however if not I will start him on IMDUR 30 mg daily as this is a 4 dollar medication and his BP seems like it will tolerate the addition of a nitrate.

## 2016-02-20 NOTE — Assessment & Plan Note (Signed)
HPI: Taking BP medications as directed. Does report a nagging cough especially at night.  A: Essential HTN  P: I discussed that the lisinopril may be causing his cough however that it is a very good medicaiton for his heart and blood pressure, I did discuss the option to change to an ARB, hwoever he reports that he is not that bothered by the cough and would like to stay on lisinopril - I have increased his Coreg to 6.25 BID for anti-anginal therapy - If he cannot get ranexa from his cardiologist I am considering starting him on IMDUR 30mg  dialy

## 2016-02-20 NOTE — Assessment & Plan Note (Signed)
HPI: He has been takign 70/30 insulin 15 units BID without issue in addition to metformin 1g BID.  No issues with this, feels sugars have been well controlled. I was unable to download his metter but review shows readingss from 84-209 with most readings in 140s-160s.  A: Controlled Type 2 DM with peripheral vascular disease  P: Continue current medications

## 2016-02-23 NOTE — Progress Notes (Signed)
Internal Medicine Clinic Attending  Case discussed with Dr. Hoffman at the time of the visit.  We reviewed the resident's history and exam and pertinent patient test results.  I agree with the assessment, diagnosis, and plan of care documented in the resident's note.  

## 2016-03-03 ENCOUNTER — Encounter: Payer: Self-pay | Admitting: Cardiovascular Disease

## 2016-03-03 ENCOUNTER — Ambulatory Visit (INDEPENDENT_AMBULATORY_CARE_PROVIDER_SITE_OTHER): Payer: No Typology Code available for payment source | Admitting: Cardiovascular Disease

## 2016-03-03 VITALS — BP 102/72 | HR 95 | Ht 70.0 in | Wt 216.4 lb

## 2016-03-03 DIAGNOSIS — I119 Hypertensive heart disease without heart failure: Secondary | ICD-10-CM | POA: Insufficient documentation

## 2016-03-03 DIAGNOSIS — I6523 Occlusion and stenosis of bilateral carotid arteries: Secondary | ICD-10-CM

## 2016-03-03 DIAGNOSIS — I209 Angina pectoris, unspecified: Secondary | ICD-10-CM

## 2016-03-03 DIAGNOSIS — E785 Hyperlipidemia, unspecified: Secondary | ICD-10-CM

## 2016-03-03 DIAGNOSIS — I739 Peripheral vascular disease, unspecified: Secondary | ICD-10-CM

## 2016-03-03 DIAGNOSIS — I25119 Atherosclerotic heart disease of native coronary artery with unspecified angina pectoris: Secondary | ICD-10-CM

## 2016-03-03 DIAGNOSIS — I6529 Occlusion and stenosis of unspecified carotid artery: Secondary | ICD-10-CM | POA: Insufficient documentation

## 2016-03-03 HISTORY — DX: Hypertensive heart disease without heart failure: I11.9

## 2016-03-03 HISTORY — DX: Occlusion and stenosis of unspecified carotid artery: I65.29

## 2016-03-03 LAB — LIPID PANEL
CHOL/HDL RATIO: 5.8 ratio — AB (ref <=5.0)
CHOLESTEROL: 121 mg/dL — AB (ref 125–200)
HDL: 21 mg/dL — AB (ref >=40)
LDL Cholesterol: 36 mg/dL (ref <130)
Triglycerides: 320 mg/dL — ABNORMAL HIGH (ref <150)
VLDL: 64 mg/dL — AB (ref <30)

## 2016-03-03 NOTE — Progress Notes (Signed)
Cardiology Office Note   Date:  03/03/2016   ID:  Naeem Bonnar, DOB January 13, 1956, MRN [id number]  PCP:  [name], DO  Cardiologist:   Skeet Latch, MD   Chief Complaint  Patient presents with  . Follow-up    cramping in legs regularly. dizziness; randomly, and frequently     Patient ID: Jason Velasquez is a 60 y.o. male with CAD s/p CABG, hypertension, diabetes and CVA who presents for an evaluation of chest pain.    History of Present Illness:  Jason Velasquez was first evaluated for chest pain on 07/27/15. The episode. While helping a friend move. He previously underwent CABG in 2007. He underwent nuclear stress testing on 08/21/15 revealed LVEF 57% with a meal in the inferoseptal, inferior, and anterior locations. He subsequently underwent left heart catheterization on 08/26/15 that revealed severe three vessel disease including a 90% left main lesion and an occluded RCA and LAD. His LIMA to the LAD was patent but all other vein grafts were occluded. There were no optimal PCI targets.  He was started on carvedilol and Ranexa. Jason Velasquez also underwent ABI testing that revealed diffuse atherosclerosis from the proximal abdominal aorta through the common and external iliac arteries bilaterally. There was no detectable flow in the L peroneal or anterior tibial arteries. ABIs were near 0.8 bilaterally. He was referred to Dr. Gwenlyn Found where medical management was advised.  Ranexa  was increased to 1000 mg twice a day and his symptoms improved.  Jason Velasquez was noted to have carotid bruits and was referred for carotid ultrasound on 12/09/15.  This revealed 40-59% stenosis on the right and 1-39% stenosis on the left.  Since increasing the Ranexa he has been doing much better.  He had one episode of chest pain two nights ago.  The chest pain occurred when he was laying in the bed trying to go to sleep.  The episode lasted 20 minutes and there was no shortness of breath or nausea.  He denies any  exertional symptoms. He has noted some dizziness and wonders whether this could be due to the Ranexa.  He typically notes it when he goes from bending over to standing. His biggest complaint is claudication in bilateral legs. If he walks halfway around [place] his leg started to hurt and he has to stop.  Jason Velasquez is not getting any formal exercise.   Past Medical History  Diagnosis Date  . Hypertension   . Hypercholesterolemia   . CAD (coronary artery disease) of artery bypass graft 06/25/2014    CABG- 2007   . History of CVA (cerebrovascular accident) 07/12/2014  . DKA (diabetic ketoacidoses) ([place]) 12/07/2014  . Controlled type 2 diabetes mellitus without complication ([place]) 123XX123    Dx March of 2016. GAD65 negative Anti Islet cell negative    . CAD (coronary artery disease), native coronary artery 06/25/2014    CABG- 2007   . S/P CABG (coronary artery bypass graft) 03/28/2015    2007 at [place] [facility]   . Stroke ([place])     2015  . Hx of adenomatous colonic polyps 05/27/2015  . Tubular adenoma of colon 05/31/2015    Colonoscopy 05/20/15 with [name]: 3 tubullar adenoma's largest 2.3cm.  Plan for repeat Colonoscopy in 2019.  . Claudication ([place])   . Carotid stenosis 03/03/2016    123456 RICA, 123456 LICA, [id number] RECA stenosis Repeat 12/2016.  Marland Kitchen Hypertensive heart disease 03/03/2016    Past Surgical History  Procedure Laterality Date  .  Cardiac surgery    . Coronary artery bypass graft    . Cardiac catheterization N/A 08/26/2015    Procedure: Left Heart Cath and Cors/Grafts Angiography;  Surgeon: [name], MD;  Location: [facility];  Service: Cardiovascular;  Laterality: N/A;    Current Outpatient Prescriptions  Medication Sig Dispense Refill  . aspirin 81 MG tablet Take 2 pills daily (Patient taking differently: Take 81 mg by mouth daily. Take 2 pills daily) 30 tablet 5  . Blood Gluc Meter Disp-Strips (BLOOD GLUCOSE METER DISPOSABLE) DEVI 1 strip by Does not  apply route 4 (four) times daily -  before meals and at bedtime. 50 each 11  . Blood Glucose Monitoring Suppl (RELION PRIME MONITOR) DEVI 1 Device by Does not apply route 4 (four) times daily -  with meals and at bedtime. 1 Device 3  . carvedilol (COREG) 6.25 MG tablet Take 1 tablet (6.25 mg total) by mouth 2 (two) times daily with a meal. 60 tablet 5  . insulin aspart protamine- aspart (NOVOLOG MIX 70/30) (70-30) 100 UNIT/ML injection Inject 15 units in the morning and 15 units in the evening before meals. 10 mL 11  . Insulin Syringes, Disposable, U-100 0.5 ML MISC 1 applicator by Does not apply route 2 (two) times daily with a meal. 100 each 11  . Lancets MISC 1 Device by Does not apply route 4 (four) times daily -  before meals and at bedtime. 100 each 11  . lisinopril (PRINIVIL,ZESTRIL) 10 MG tablet Take 1 tablet (10 mg total) by mouth daily. 30 tablet 11  . metFORMIN (GLUCOPHAGE) 1000 MG tablet Take 1 tablet (1,000 mg total) by mouth 2 (two) times daily with a meal. 60 tablet 11  . ranolazine (RANEXA) 1000 MG SR tablet Take 1 tablet (1,000 mg total) by mouth 2 (two) times daily. 180 tablet 3  . rosuvastatin (CRESTOR) 20 MG tablet Take 1 tablet (20 mg total) by mouth at bedtime. 30 tablet 11   No current facility-administered medications for this visit.    Allergies:   Review of patient's allergies indicates no known allergies.    Social History:  The patient  reports that he quit smoking about 6 years ago. He has never used smokeless tobacco. He reports that he does not drink alcohol or use illicit drugs.   Family History:  The patient's family history includes Diabetes in his father; Hypertension in his mother and sister. There is no history of Colon cancer.    ROS:  Please see the history of present illness.   Otherwise, review of systems are positive for none.   All other systems are reviewed and negative.    PHYSICAL EXAM: VS:  BP 102/72 mmHg  Pulse 95  Ht 5\' 10"  (1.778 m)  Wt  216 lb 6.4 oz (98.158 kg)  BMI 31.05 kg/m2 , BMI Body mass index is 31.05 kg/(m^2). GENERAL:  Well appearing HEENT:  Pupils equal round and reactive, fundi not visualized, oral mucosa unremarkable NECK:  No jugular venous distention, waveform within normal limits, carotid upstroke brisk and symmetric, slight L carotid bruit. LYMPHATICS:  No cervical adenopathy LUNGS:  Clear to auscultation bilaterally HEART:  RRR.  PMI not displaced or sustained,S1 and S2 within normal limits, no S3, no S4, no clicks, no rubs, no murmurs ABD:  Flat, positive bowel sounds normal in frequency in pitch, no bruits, no rebound, no guarding, no midline pulsatile mass, no hepatomegaly, no splenomegaly EXT:  1+ DP/PT bilaterally, no edema,  no cyanosis no clubbing SKIN:  No rashes no nodules NEURO:  Cranial nerves II through XII grossly intact, motor grossly intact throughout PSYCH:  Cognitively intact, oriented to person place and time   EKG:  EKG is ordered today. The ekg ordered today demonstrates sinus rhythm rate 82 bpm.  Prior inferior infarct.   Lexiscan Cardiolite 08/21/15:  Nuclear stress EF: 57%.  The left ventricular ejection fraction is normal (55-65%).  There was no ST segment deviation noted during stress.  No T wave inversion was noted during stress.  Defect 1: There is a small defect of moderate severity present in the basal inferoseptal and basal inferior location.  Defect 2: There is a defect present in the basal anterior, mid anterior and apical anterior location.  Findings consistent with ischemia.  This is a high risk study.  High risk stress nuclear study with two areas of ischemia and normal left ventricular regional and global systolic function. The inferior area of ischemia is small. The anterior area of ischemia is extensive, but appears to be very mild. The septum is spared.  LHC 08/26/15:  LM lesion, 90% stenosed. Involving both LAD and circumflex ostia  Prox RCA  lesion, 90% stenosed. Mid RCA lesion, 90% stenosed. Dist RCA lesion, 100% stenosed.  SVG -(presumably to distal RCA) was not visualized . Likely flush occluded  Ost LAD lesion, 80% stenosed.  Ost Cx lesion, 90% stenosed. Prox Cx lesion, 80% stenosed.  Ost 1st Mrg to 1st Mrg lesion, 85% stenosed.  SVG-OM1 Prox Graft to Dist Graft lesion, 100% stenosed. Short stump noted at the ostium as well as at insertion site, this would suggest chronic occlusion  Mid Cx lesion, 95% stenosed. 3rd Mrg lesion, 100% stenosed.  Ost LAD to Mid LAD lesion, dilated, ectatic disease prior to 100% occlusion.  LIMA-LAD was injected is normal in caliber, widely patent to a wraparound LAD   Severe native vessel disease including left main stenosis with occluded RCA and LAD. Also subtotal occluded OM1 and total occluded OM 3. It is not clear what the grafts went to but both vein grafts are occluded.  LIMA-LAD is widely patent.  No optimal PCI targets. We'll need to review with interventional colleagues to determine if left main, circumflex, OM1 PCI is a viable option. OM1 is less than 2 mm in diameter, and therefore not an optimal target.  Carotid Doppler 12/11/15: 123456 RICA, 123456 LICA, [id number] RECA stenosis  Recent Labs: 08/25/2015: BUN 14; Creat 0.99; Hemoglobin 13.1; Platelets 216; Potassium 4.6; Sodium 136    Lipid Panel    Component Value Date/Time   CHOL 129 07/10/2015 1418   CHOL 162 06/25/2014 1102   TRIG 314* 07/10/2015 1418   HDL 21* 07/10/2015 1418   HDL 27* 06/25/2014 1102   CHOLHDL 6.1* 07/10/2015 1418   CHOLHDL 6.0 06/25/2014 1102   VLDL 74* 06/25/2014 1102   LDLCALC 45 07/10/2015 1418   LDLCALC 61 06/25/2014 1102      Wt Readings from Last 3 Encounters:  03/03/16 216 lb 6.4 oz (98.158 kg)  02/19/16 218 lb 12.8 oz (99.247 kg)  12/02/15 209 lb 14.4 oz (95.21 kg)      ASSESSMENT AND PLAN:  # CAD: Jason Velasquez has severe 3 vessel CAD and all his CABG grafts are occluded with the  exception of his LIMA to the LAD.  His angina has been better controlled after adding Ranexa.  He continues to work through the patient assistance program and will find out soon if  he is eligible. For now he has 2 weeks of tablets.  Continue carvedilol and lisinopril, aspirin, and rosuvastatin.    # Hypertension: BP well-controlled.  Continue carvedilol and lisinopril.    # Hyperlipidemia: Continue rosuvastatin.  Jason Velasquez was encouraged to increase his physical activity.  Repeat lipids and LFTs. Triglycerides were elevated when last checked. We'll consider adding a defibrillator if they remain elevated. We discussed the need to lower his carbohydrate intake.  # Claudication: ABIs 0.8 bilaterally.  No intervention per Dr. Gwenlyn Found.  There was no focal high-grade stenosis. Unfortunately, he is unable to participate in cardiac rehabilitation for a structured walking program as he does not have insurance. I recommended that he start walking 5 days per week for 15 minutes each time. If he has to stop that is okay. He will rest and resume walking for a total of 15 minutes with plans to increase by one to 2 minutes each week. Ultimately, he will walk for 30 minutes at a time. Continue aspirin and rosuvastatin    # Carotid stenosis: Carotid Doppler revealed 50-49% R ICA stenosis and 1-39% stenosis on the left.  This needs to be repeated 12/2016. Continue aspirin and rosuvastatin.   Current medicines are reviewed at length with the patient today.  The patient does not have concerns regarding medicines.  The following changes have been made:  no change  Labs/ tests ordered today include:   Orders Placed This Encounter  Procedures  . Lipid panel     Disposition:   FU with Jesica Goheen C. Oval Linsey, MD in 3 months.    Signed, Skeet Latch, MD  03/03/2016 2:57 PM    [facility]

## 2016-03-03 NOTE — Patient Instructions (Signed)
Medication Instructions:  Your physician recommends that you continue on your current medications as directed. Please refer to the Current Medication list given to you today.  Labwork: LIPID PANEL TODAY AT [facility] ON THE FIRST FLOOR  Testing/Procedures: none  Follow-Up: Your physician recommends that you schedule a follow-up appointment in: 4 month ov   If you need a refill on your cardiac medications before your next appointment, please call your pharmacy.

## 2016-03-10 ENCOUNTER — Telehealth (HOSPITAL_COMMUNITY): Payer: Self-pay

## 2016-03-10 NOTE — Telephone Encounter (Signed)
Pt was contacted on 03/10/16 in regards to exercising at [facility] cardiac/pulmonary rehab. Pt declined services at this time due to financial reasons. Discussed cardiac maintenance a self pay program but pt is unable to afford program. Encouraged pt to walk for exercise at home, stay in touch with PCP and cardiologist and to continue taking meds. Pt states that he understands. Also gave pt contact info for cardiac rehab at [facility], in case things change in which he could come to [facility] or maintenance.  Did make mention of medicaid and financial assistant and pt said that he would look into it.

## 2016-03-15 ENCOUNTER — Encounter: Payer: Self-pay | Admitting: *Deleted

## 2016-03-23 ENCOUNTER — Encounter: Payer: Self-pay | Admitting: *Deleted

## 2016-03-25 ENCOUNTER — Other Ambulatory Visit: Payer: Self-pay | Admitting: Cardiovascular Disease

## 2016-03-26 NOTE — Telephone Encounter (Signed)
Review for refill, Thank you. 

## 2016-03-30 ENCOUNTER — Other Ambulatory Visit: Payer: Self-pay

## 2016-03-30 ENCOUNTER — Other Ambulatory Visit: Payer: Self-pay | Admitting: Cardiovascular Disease

## 2016-03-30 NOTE — Telephone Encounter (Signed)
Pt requesting carvedilol to be filled @ [facility] on [address]

## 2016-03-30 NOTE — Telephone Encounter (Signed)
Review for refill, Thank you. 

## 2016-03-31 NOTE — Telephone Encounter (Signed)
Rx request sent to pharmacy.  

## 2016-03-31 NOTE — Telephone Encounter (Signed)
It is done and sent to [facility]

## 2016-04-05 ENCOUNTER — Encounter: Payer: Self-pay | Admitting: Pharmacist

## 2016-04-05 NOTE — Progress Notes (Signed)
Patient ID: Jason Velasquez, male   DOB: 1956-06-24, 60 y.o.   MRN: [id number]  Medication Samples have been provided to the patient.  Drug: Insulin aspart mix 70/30 (Novolog) Strength: 100 units/mL Qty: 1 LOT: [id number] Exp.Date: 11/2016 Dosing instructions: 15 units BID  The patient has been instructed regarding the correct time, dose, and frequency of taking this medication, including desired effects and most common side effects.   Miriana Gaertner J 6:03 PM 04/05/2016

## 2016-04-23 ENCOUNTER — Other Ambulatory Visit: Payer: Self-pay | Admitting: Internal Medicine

## 2016-04-23 NOTE — Telephone Encounter (Signed)
Pt Req Metformin refill

## 2016-04-26 MED ORDER — METFORMIN HCL 1000 MG PO TABS: 1000.0000 mg | ORAL_TABLET | Freq: Two times a day (BID) | ORAL | 11 refills | Status: DC

## 2016-06-24 ENCOUNTER — Telehealth: Payer: Self-pay | Admitting: *Deleted

## 2016-06-24 MED ORDER — ATORVASTATIN CALCIUM 40 MG PO TABS: 40.0000 mg | ORAL_TABLET | Freq: Every day | ORAL | 11 refills | Status: DC

## 2016-06-24 MED FILL — ATORVASTATIN 40 MG TABLET: 40 | 30 days supply | Qty: 30 | Fill #0

## 2016-06-24 NOTE — Telephone Encounter (Signed)
Called pt - stated Dr Heber [place] had just called and talked to him about new rx which was sent to [facility].

## 2016-06-24 NOTE — Telephone Encounter (Signed)
I would prefer for Jason Velasquez to be on a high intensity statin, given that I have spoken with him and will provide a prescription of Atorvastatin 40mg  daily which I sent to the [facility], I am hopeful that he will be able to get this for 4 dollars.

## 2016-06-24 NOTE — Telephone Encounter (Signed)
Fax from [facility] is no longer available for free- wants to know if u would consider changing and writing a new rx for [place]? Electronic send. Thanks

## 2016-07-02 ENCOUNTER — Ambulatory Visit (INDEPENDENT_AMBULATORY_CARE_PROVIDER_SITE_OTHER): Payer: No Typology Code available for payment source | Admitting: Cardiovascular Disease

## 2016-07-02 VITALS — BP 150/94 | HR 75 | Ht 70.0 in | Wt 214.0 lb

## 2016-07-02 DIAGNOSIS — I119 Hypertensive heart disease without heart failure: Secondary | ICD-10-CM

## 2016-07-02 DIAGNOSIS — I10 Essential (primary) hypertension: Secondary | ICD-10-CM

## 2016-07-02 DIAGNOSIS — I739 Peripheral vascular disease, unspecified: Secondary | ICD-10-CM

## 2016-07-02 DIAGNOSIS — I209 Angina pectoris, unspecified: Secondary | ICD-10-CM

## 2016-07-02 DIAGNOSIS — E7800 Pure hypercholesterolemia, unspecified: Secondary | ICD-10-CM

## 2016-07-02 DIAGNOSIS — I6523 Occlusion and stenosis of bilateral carotid arteries: Secondary | ICD-10-CM

## 2016-07-02 NOTE — Patient Instructions (Signed)
Medication Instructions:  START FISH OIL 1000 MG DAILY  Labwork: NONE  Testing/Procedures: NONE  Follow-Up: Your physician recommends that you schedule a follow-up appointment in: [place]  Your physician wants you to follow-up in: [place] will receive a reminder letter in the mail two months in advance. If you don't receive a letter, please call our office to schedule the follow-up appointment.  If you need a refill on your cardiac medications before your next appointment, please call your pharmacy.

## 2016-07-02 NOTE — Progress Notes (Signed)
Cardiology Office Note   Date:  07/02/2016   ID:  Jason Velasquez, DOB Mar 30, 1956, MRN [id number]  PCP:  [name], DO  Cardiologist:   Skeet Latch, MD   Chief Complaint  Patient presents with  . Follow-up    pt c/o chest pain couple weeks ago     Patient ID: Jason Velasquez is a 60 y.o. male with CAD s/p CABG, chronic stable angina, PAD, hypertension, diabetes and CVA who presents for follow up.  Jason Velasquez was first evaluated for chest pain on 07/27/15.  He previously underwent CABG in 2007. He had a nuclear stress test on 08/21/15 revealed LVEF 57% with moderate ischemia in the inferoseptal, inferior, and anterior locations. He subsequently underwent left heart catheterization on 08/26/15 that revealed severe three vessel disease including a 90% left main lesion and an occluded RCA and LAD. His LIMA to the LAD was patent but all other vein grafts were occluded. There were no optimal PCI targets.  He was started on carvedilol and Ranexa. Jason Velasquez also underwent ABI testing that revealed diffuse atherosclerosis from the proximal abdominal aorta through the common and external iliac arteries bilaterally. There was no detectable flow in the L peroneal or anterior tibial arteries. ABIs were near 0.8 bilaterally. He was referred to Dr. Gwenlyn Found where medical management was advised.  Ranexa  was increased to 1000 mg twice a day and his symptoms improved.  Jason Velasquez was noted to have carotid bruits and was referred for carotid ultrasound on 12/09/15.  This revealed 40-59% stenosis on the right and 1-39% stenosis on the left.  Since his last appointment Jason Velasquez continues to do well. He notes that when he doesn't get enough rest he wakes up with chest pain.  It improves when he sits up and walks around.  He notes mild episodes of chest pain with exertion.  This occurs one to two times in the last month, but he has been limiting activity to avoid the chest pain.  He hasn't been exercising much.   He walks in the grocery stores 2-3 times per week for 20 minutes.  He also gets claudication that requires him to stop and rest.  However, he starts again.  He thinks that this is helping.   Past Medical History:  Diagnosis Date  . CAD (coronary artery disease) of artery bypass graft 06/25/2014   CABG- 2007   . CAD (coronary artery disease), native coronary artery 06/25/2014   CABG- 2007   . Carotid stenosis 03/03/2016   123456 RICA, 123456 LICA, 123XX123 RECA stenosis Repeat 12/2016.  . Claudication ([place])   . Controlled type 2 diabetes mellitus without complication ([place]) 123XX123   Dx March of 2016. GAD65 negative Anti Islet cell negative    . DKA (diabetic ketoacidoses) ([place]) 12/07/2014  . History of CVA (cerebrovascular accident) 07/12/2014  . Hx of adenomatous colonic polyps 05/27/2015  . Hypercholesterolemia   . Hypertension   . Hypertensive heart disease 03/03/2016  . S/P CABG (coronary artery bypass graft) 03/28/2015   2007 at [facility]   . Stroke ([place])    2015  . Tubular adenoma of colon 05/31/2015   Colonoscopy 05/20/15 with [name]: 3 tubullar adenoma's largest 2.3cm.  Plan for repeat Colonoscopy in 2019.    Past Surgical History:  Procedure Laterality Date  . CARDIAC CATHETERIZATION N/A 08/26/2015   Procedure: Left Heart Cath and Cors/Grafts Angiography;  Surgeon: [name], MD;  Location: [facility];  Service: Cardiovascular;  Laterality: N/A;  . CARDIAC SURGERY    . CORONARY ARTERY BYPASS GRAFT      Current Outpatient Prescriptions  Medication Sig Dispense Refill  . aspirin EC 81 MG tablet Take 81 mg by mouth 2 (two) times daily.    Marland Kitchen atorvastatin (LIPITOR) 40 MG tablet Take 1 tablet (40 mg total) by mouth daily. 30 tablet 11  . Blood Gluc Meter Disp-Strips (BLOOD GLUCOSE METER DISPOSABLE) DEVI 1 strip by Does not apply route 4 (four) times daily -  before meals and at bedtime. 50 each 11  . Blood Glucose Monitoring Suppl (RELION PRIME MONITOR)  DEVI 1 Device by Does not apply route 4 (four) times daily -  with meals and at bedtime. 1 Device 3  . carvedilol (COREG) 3.125 MG tablet TAKE ONE TABLET BY MOUTH TWICE DAILY WITH A MEAL 60 tablet 2  . insulin aspart protamine- aspart (NOVOLOG MIX 70/30) (70-30) 100 UNIT/ML injection Inject 15 units in the morning and 15 units in the evening before meals. 10 mL 11  . Insulin Syringes, Disposable, U-100 0.5 ML MISC 1 applicator by Does not apply route 2 (two) times daily with a meal. 100 each 11  . Lancets MISC 1 Device by Does not apply route 4 (four) times daily -  before meals and at bedtime. 100 each 11  . lisinopril (PRINIVIL,ZESTRIL) 10 MG tablet Take 1 tablet (10 mg total) by mouth daily. 30 tablet 11  . metFORMIN (GLUCOPHAGE) 1000 MG tablet Take 1 tablet (1,000 mg total) by mouth 2 (two) times daily with a meal. 60 tablet 11  . ranolazine (RANEXA) 1000 MG SR tablet Take 1 tablet (1,000 mg total) by mouth 2 (two) times daily. 180 tablet 3   No current facility-administered medications for this visit.     Allergies:   Review of patient's allergies indicates no known allergies.    Social History:  The patient  reports that he quit smoking about 6 years ago. He has never used smokeless tobacco. He reports that he does not drink alcohol or use drugs.   Family History:  The patient's family history includes Diabetes in his father; Hypertension in his mother and sister.    ROS:  Please see the history of present illness.   Otherwise, review of systems are positive for none.   All other systems are reviewed and negative.    PHYSICAL EXAM: VS:  BP (!) 150/94   Pulse 75   Ht 5\' 10"  (1.778 m)   Wt 214 lb (97.1 kg)   BMI 30.71 kg/m  , BMI Body mass index is 30.71 kg/m. GENERAL:  Well appearing HEENT:  Pupils equal round and reactive, fundi not visualized, oral mucosa unremarkable NECK:  No jugular venous distention, waveform within normal limits, carotid upstroke brisk and symmetric,  slight L carotid bruit. LYMPHATICS:  No cervical adenopathy LUNGS:  Clear to auscultation bilaterally HEART:  RRR.  PMI not displaced or sustained,S1 and S2 within normal limits, no S3, no S4, no clicks, no rubs, no murmurs ABD:  Flat, positive bowel sounds normal in frequency in pitch, no bruits, no rebound, no guarding, no midline pulsatile mass, no hepatomegaly, no splenomegaly EXT:  1+ DP/PT bilaterally, no edema, no cyanosis no clubbing SKIN:  No rashes no nodules NEURO:  Cranial nerves II through XII grossly intact, motor grossly intact throughout PSYCH:  Cognitively intact, oriented to person place and time   EKG:  EKG is ordered today. The ekg ordered today  demonstrates sinus rhythm rate 82 bpm.  Prior inferior infarct.  07/02/16: Sinus rhythm rate 75 bpm.  Prior inferior infarct   Lexiscan Cardiolite 08/21/15:  Nuclear stress EF: 57%.  The left ventricular ejection fraction is normal (55-65%).  There was no ST segment deviation noted during stress.  No T wave inversion was noted during stress.  Defect 1: There is a small defect of moderate severity present in the basal inferoseptal and basal inferior location.  Defect 2: There is a defect present in the basal anterior, mid anterior and apical anterior location.  Findings consistent with ischemia.  This is a high risk study.  High risk stress nuclear study with two areas of ischemia and normal left ventricular regional and global systolic function. The inferior area of ischemia is small. The anterior area of ischemia is extensive, but appears to be very mild. The septum is spared.  LHC 08/26/15:  LM lesion, 90% stenosed. Involving both LAD and circumflex ostia  Prox RCA lesion, 90% stenosed. Mid RCA lesion, 90% stenosed. Dist RCA lesion, 100% stenosed.  SVG -(presumably to distal RCA) was not visualized . Likely flush occluded  Ost LAD lesion, 80% stenosed.  Ost Cx lesion, 90% stenosed. Prox Cx lesion, 80%  stenosed.  Ost 1st Mrg to 1st Mrg lesion, 85% stenosed.  SVG-OM1 Prox Graft to Dist Graft lesion, 100% stenosed. Short stump noted at the ostium as well as at insertion site, this would suggest chronic occlusion  Mid Cx lesion, 95% stenosed. 3rd Mrg lesion, 100% stenosed.  Ost LAD to Mid LAD lesion, dilated, ectatic disease prior to 100% occlusion.  LIMA-LAD was injected is normal in caliber, widely patent to a wraparound LAD   Severe native vessel disease including left main stenosis with occluded RCA and LAD. Also subtotal occluded OM1 and total occluded OM 3. It is not clear what the grafts went to but both vein grafts are occluded.  LIMA-LAD is widely patent.  No optimal PCI targets. We'll need to review with interventional colleagues to determine if left main, circumflex, OM1 PCI is a viable option. OM1 is less than 2 mm in diameter, and therefore not an optimal target.  Carotid Doppler 12/11/15: 123456 RICA, 123456 LICA, [id number] RECA stenosis  Recent Labs: 08/25/2015: BUN 14; Creat 0.99; Hemoglobin 13.1; Platelets 216; Potassium 4.6; Sodium 136    Lipid Panel    Component Value Date/Time   CHOL 121 (L) 03/03/2016 1503   CHOL 129 07/10/2015 1418   TRIG 320 (H) 03/03/2016 1503   HDL 21 (L) 03/03/2016 1503   HDL 21 (L) 07/10/2015 1418   CHOLHDL 5.8 (H) 03/03/2016 1503   VLDL 64 (H) 03/03/2016 1503   LDLCALC 36 03/03/2016 1503   LDLCALC 45 07/10/2015 1418      Wt Readings from Last 3 Encounters:  07/02/16 214 lb (97.1 kg)  03/03/16 216 lb 6.4 oz (98.2 kg)  02/19/16 218 lb 12.8 oz (99.2 kg)      ASSESSMENT AND PLAN:  # CAD: Mr. Osmanski has severe 3 vessel CAD and all his CABG grafts are occluded with the exception of his LIMA to the LAD.  His angina has been better controlled after adding Ranexa.  He has been successfully enrolled in the assistance program and can get it for free. Continue carvedilol and lisinopril, aspirin, and rosuvastatin.    # Hypertension: BP is  above goal. However, he has not taken his antihypertensives today.  Continue carvedilol and lisinopril.  I have asked him to always  take it prior to his appointments.   # Hyperlipidemia:  Rosuvastatin was switched to atorvastatin. Add fish oil 1000mg  daily due to hypertriglyceridemia.   # Claudication: ABIs 0.8 bilaterally.  No intervention per Dr. Gwenlyn Found.  There was no focal high-grade stenosis. Unfortunately, he is unable to participate in cardiac rehabilitation for a structured walking program as he does not have insurance.  He has been walking, which has helped. Continue aspirin and atorvastatin    # Carotid stenosis: Carotid Doppler revealed 50-49% R ICA stenosis and 1-39% stenosis on the left.  This needs to be repeated 12/2016. Continue aspirin and atorvastatin.   Current medicines are reviewed at length with the patient today.  The patient does not have concerns regarding medicines.  The following changes have been made:  Add fish oil  Labs/ tests ordered today include:   No orders of the defined types were placed in this encounter.    Disposition:   FU with Lillee Mooneyhan C. Oval Linsey, MD in 6 months.  Kerin Ransom in 3 months.    Signed, Skeet Latch, MD  07/02/2016 10:38 AM    [place]

## 2016-07-07 ENCOUNTER — Encounter: Payer: Self-pay | Admitting: Cardiovascular Disease

## 2016-07-21 ENCOUNTER — Ambulatory Visit: Payer: No Typology Code available for payment source

## 2016-07-26 MED FILL — ATORVASTATIN 40 MG TABLET: 40 | 30 days supply | Qty: 30 | Fill #1

## 2016-07-29 ENCOUNTER — Ambulatory Visit (INDEPENDENT_AMBULATORY_CARE_PROVIDER_SITE_OTHER): Payer: No Typology Code available for payment source | Admitting: Internal Medicine

## 2016-07-29 DIAGNOSIS — Z794 Long term (current) use of insulin: Principal | ICD-10-CM

## 2016-07-29 DIAGNOSIS — I119 Hypertensive heart disease without heart failure: Secondary | ICD-10-CM

## 2016-07-29 DIAGNOSIS — Z79899 Other long term (current) drug therapy: Secondary | ICD-10-CM

## 2016-07-29 DIAGNOSIS — Z7984 Long term (current) use of oral hypoglycemic drugs: Secondary | ICD-10-CM

## 2016-07-29 DIAGNOSIS — I10 Essential (primary) hypertension: Secondary | ICD-10-CM

## 2016-07-29 DIAGNOSIS — Z7982 Long term (current) use of aspirin: Secondary | ICD-10-CM

## 2016-07-29 DIAGNOSIS — I739 Peripheral vascular disease, unspecified: Secondary | ICD-10-CM

## 2016-07-29 DIAGNOSIS — Z87891 Personal history of nicotine dependence: Secondary | ICD-10-CM

## 2016-07-29 DIAGNOSIS — E1151 Type 2 diabetes mellitus with diabetic peripheral angiopathy without gangrene: Secondary | ICD-10-CM

## 2016-07-29 LAB — POCT GLYCOSYLATED HEMOGLOBIN (HGB A1C): HEMOGLOBIN A1C: 6.5

## 2016-07-29 LAB — GLUCOSE, CAPILLARY: Glucose-Capillary: 181 mg/dL — ABNORMAL HIGH (ref 65–99)

## 2016-07-29 MED ORDER — SITAGLIPTIN PHOSPHATE 100 MG PO TABS: 100.0000 mg | ORAL_TABLET | Freq: Every day | ORAL | 11 refills | Status: DC

## 2016-07-29 MED ORDER — METOPROLOL SUCCINATE ER 100 MG PO TB24: 100.0000 mg | ORAL_TABLET | Freq: Every day | ORAL | 11 refills | Status: DC

## 2016-07-29 MED FILL — METOPROLOL SUCC ER 100 MG T: 100 | 30 days supply | Qty: 30 | Fill #0

## 2016-07-29 MED FILL — JANUVIA 100 MG TABLET: 100 | 30 days supply | Qty: 30 | Fill #0

## 2016-07-29 NOTE — Patient Instructions (Signed)
I want you to continue to check your blood sugars twice a day.  I am going to start you on a diabetes medication called Januvia, you will take 100mg  once a day.  You may stop taking your insulin but please call me if your sugars go up so that I can tell you what dose to start back with your insulin.  I am changing your Coreg to Metoprolol succinate (100mg  a day)  Please increase exercise as we discussed.

## 2016-07-29 NOTE — Progress Notes (Signed)
[facility] Subjective:  HPI: [name] is a 60 y.o. male who presents for follow up of HTN and DM.  Please see problem based charting below for the status of his chronic medical problems    Review of Systems: Review of Systems  Constitutional: Negative for fever.  Respiratory: Negative for cough and shortness of breath.   Cardiovascular: Negative for chest pain.  Musculoskeletal: Negative for myalgias.  Neurological: Negative for headaches.    Objective:  Physical Exam: There were no vitals filed for this visit. Physical Exam  Constitutional: He is well-developed, well-nourished, and in no distress.  HENT:  Mouth/Throat: Oropharynx is clear and moist.  Cardiovascular: Normal rate and regular rhythm.   Pulmonary/Chest: Effort normal and breath sounds normal.  Abdominal: Soft. There is no tenderness.  Musculoskeletal: He exhibits no edema.  Nursing note and vitals reviewed.  Assessment & Plan:  Essential hypertension HPI: He has been taking Lisinopril 10mg  daily and coreg 6.25mg  BID.  He has one episode of orthostasis on a hot day when helping someone move.    A: Essential HTN at goal  P: Continue lisinopril 10mg  daily HR is 95 today, will change Coreg to Metoprolol succinate 100mg  daily for better HR control given his severe CAD   Hypertensive heart disease -Change coreg to metoprolol succinate 100mg  a day for better HR control  Controlled type 2 diabetes mellitus with diabetic peripheral angiopathy without gangrene (HCC) HPI: He has been taking 15 units of Novolog 70/30 BID for his diabetes as well as metformin 1g BID.  Weight down 2 lbs since last visit.  He is checking his sugars and brought his glucometer for review,  Upon review he has no lows, >90% in range and appears to be doing well.  A1c today also 6.5%, he reminds me today that I told him it is possible to get off insulin and wonders if he is near being able to.  A: Controlled  Type 2 DM with peripheral angiopathy.  P:  I congratulated him on his hard work, especially with his financial constraints.  We will continue metformin 1g BID I will have him start Januvia 100mg  daily (obtain thru [facility]) He may stop Novolog 70/30,  I will have him continue to monitor CBG 2-3 times a day for the next few weeks and call if sugars rising before his next visit.     Claudication [facility]) HPI: Still has cramping with walking.  We had to change Crestor to Atorvastatin due to cost.  Doing well with atorvastatin currently.  He reports he is considering starting exercise program at senior center.  A: Claudication due to PVD  P:Continue ASA and Atorvastatin 40mg , encouraged increased exercise.   Medications Ordered Meds ordered this encounter  Medications  . sitaGLIPtin (JANUVIA) 100 MG tablet    Sig: Take 1 tablet (100 mg total) by mouth daily.    Dispense:  30 tablet    Refill:  11    IM Program  . metoprolol succinate (TOPROL XL) 100 MG 24 hr tablet    Sig: Take 1 tablet (100 mg total) by mouth daily. Take with or immediately following a meal.    Dispense:  30 tablet    Refill:  11    IM program   Other Orders Orders Placed This Encounter  Procedures  . Glucose, capillary  . BMP8+Anion Gap  . POCT HgB A1C (CPT [phone])   Follow Up: Return in about 3 months (around 10/29/2016).

## 2016-07-30 LAB — BMP8+ANION GAP
ANION GAP: 17 mmol/L (ref 10.0–18.0)
BUN/Creatinine Ratio: 9 — ABNORMAL LOW (ref 10–24)
BUN: 12 mg/dL (ref 8–27)
CALCIUM: 9.2 mg/dL (ref 8.6–10.2)
CHLORIDE: 100 mmol/L (ref 96–106)
CO2: 23 mmol/L (ref 18–29)
Creatinine, Ser: 1.29 mg/dL — ABNORMAL HIGH (ref 0.76–1.27)
GFR calc Af Amer: 69 mL/min/{1.73_m2} (ref >59)
GFR, EST NON AFRICAN AMERICAN: 59 mL/min/{1.73_m2} — AB (ref >59)
Glucose: 157 mg/dL — ABNORMAL HIGH (ref 65–99)
POTASSIUM: 4.5 mmol/L (ref 3.5–5.2)
SODIUM: 140 mmol/L (ref 134–144)

## 2016-07-30 NOTE — Assessment & Plan Note (Signed)
-  Change coreg to metoprolol succinate 100mg  a day for better HR control

## 2016-07-30 NOTE — Assessment & Plan Note (Signed)
HPI: Still has cramping with walking.  We had to change Crestor to Atorvastatin due to cost.  Doing well with atorvastatin currently.  He reports he is considering starting exercise program at senior center.  A: Claudication due to PVD  P:Continue ASA and Atorvastatin 40mg , encouraged increased exercise.

## 2016-07-30 NOTE — Assessment & Plan Note (Signed)
HPI: He has been taking Lisinopril 10mg  daily and coreg 6.25mg  BID.  He has one episode of orthostasis on a hot day when helping someone move.    A: Essential HTN at goal  P: Continue lisinopril 10mg  daily HR is 95 today, will change Coreg to Metoprolol succinate 100mg  daily for better HR control given his severe CAD

## 2016-07-30 NOTE — Assessment & Plan Note (Signed)
HPI: He has been taking 15 units of Novolog 70/30 BID for his diabetes as well as metformin 1g BID.  Weight down 2 lbs since last visit.  He is checking his sugars and brought his glucometer for review,  Upon review he has no lows, >90% in range and appears to be doing well.  A1c today also 6.5%, he reminds me today that I told him it is possible to get off insulin and wonders if he is near being able to.  A: Controlled Type 2 DM with peripheral angiopathy.  P:  I congratulated him on his hard work, especially with his financial constraints.  We will continue metformin 1g BID I will have him start Januvia 100mg  daily (obtain thru [facility]) He may stop Novolog 70/30,  I will have him continue to monitor CBG 2-3 times a day for the next few weeks and call if sugars rising before his next visit.

## 2016-08-30 MED FILL — METOPROLOL SUCC ER 100 MG T: 100 | 30 days supply | Qty: 30 | Fill #1

## 2016-08-30 MED FILL — ATORVASTATIN 40 MG TABLET: 40 | 30 days supply | Qty: 30 | Fill #2

## 2016-08-30 MED FILL — JANUVIA 100 MG TABLET: 100 | 30 days supply | Qty: 30 | Fill #1

## 2016-09-28 ENCOUNTER — Encounter: Payer: Self-pay | Admitting: Cardiology

## 2016-09-28 ENCOUNTER — Ambulatory Visit (INDEPENDENT_AMBULATORY_CARE_PROVIDER_SITE_OTHER): Payer: No Typology Code available for payment source | Admitting: Cardiology

## 2016-09-28 VITALS — BP 144/84 | HR 72 | Ht 70.0 in | Wt 218.8 lb

## 2016-09-28 DIAGNOSIS — I10 Essential (primary) hypertension: Secondary | ICD-10-CM

## 2016-09-28 DIAGNOSIS — E785 Hyperlipidemia, unspecified: Secondary | ICD-10-CM

## 2016-09-28 DIAGNOSIS — I739 Peripheral vascular disease, unspecified: Secondary | ICD-10-CM

## 2016-09-28 DIAGNOSIS — Z8673 Personal history of transient ischemic attack (TIA), and cerebral infarction without residual deficits: Secondary | ICD-10-CM

## 2016-09-28 DIAGNOSIS — IMO0001 Reserved for inherently not codable concepts without codable children: Secondary | ICD-10-CM

## 2016-09-28 DIAGNOSIS — Z794 Long term (current) use of insulin: Secondary | ICD-10-CM

## 2016-09-28 DIAGNOSIS — Z951 Presence of aortocoronary bypass graft: Secondary | ICD-10-CM

## 2016-09-28 DIAGNOSIS — E118 Type 2 diabetes mellitus with unspecified complications: Secondary | ICD-10-CM

## 2016-09-28 MED ORDER — LISINOPRIL 10 MG PO TABS: 10.0000 mg | ORAL_TABLET | Freq: Every day | ORAL | 3 refills | Status: DC

## 2016-09-28 MED FILL — ATORVASTATIN 40 MG TABLET: 40 | 30 days supply | Qty: 30 | Fill #3

## 2016-09-28 MED FILL — JANUVIA 100 MG TABLET: 100 | 30 days supply | Qty: 30 | Fill #2

## 2016-09-28 MED FILL — LISINOPRIL 10 MG TABLET: 10 | 90 days supply | Qty: 90 | Fill #0

## 2016-09-28 MED FILL — METOPROLOL SUCC ER 100 MG T: 100 | 30 days supply | Qty: 30 | Fill #2

## 2016-09-28 NOTE — Assessment & Plan Note (Signed)
Mild to moderate carotid disease, severe LEA disease-medical Rx

## 2016-09-28 NOTE — Assessment & Plan Note (Signed)
Dx March of 2016. GAD65 negative Anti Islet cell negative

## 2016-09-28 NOTE — Patient Instructions (Signed)
Medication Instructions:   START LISINOPRIL 10 MG ONCE DAILY  Follow-Up:  Your physician recommends that you schedule a follow-up appointment in: 3-4 MONTHS WITH DR [facility]

## 2016-09-28 NOTE — Progress Notes (Signed)
09/28/2016 Jason Velasquez   [birth year]  [id number]  Primary Physician [name], DO Primary Cardiologist: Dr Oval Linsey  HPI:  60 y.o. male with CAD -s/p CABG in 2007, chronic stable angina, PAD, hypertension, diabetes and CVA who presents for follow up.  Jason Velasquez was evaluated for chest pain on 07/27/15.  He previously underwent CABG in 2007. He had a nuclear stress test on 08/21/15 revealed LVEF 57% with moderate ischemia in the inferoseptal, inferior, and anterior locations. He subsequently underwent left heart catheterization on 08/26/15 that revealed severe three vessel disease including a 90% left main lesion and an occluded RCA and LAD. His LIMA to the LAD was patent but all other vein grafts were occluded. There were no optimal PCI targets.  He was started on carvedilol and Ranexa. Jason Velasquez also underwent ABI testing that revealed diffuse atherosclerosis from the proximal abdominal aorta through the common and external iliac arteries bilaterally. There was no detectable flow in the L peroneal or anterior tibial arteries. ABIs were near 0.8 bilaterally. He was referred to Dr. Gwenlyn Found where medical management was advised.  He is in the office today for routine follow up. He works at [employer], 4 hours a day. He denies any exertional chest pain but says " I couldn't jog around this building".  He rarely has some chest discomfort at night though I'm not sure this is angina. He does not use NTG nor has felt the need to. For some reason he is not on Lisinopril. He though Dr Heber Avon stopped this but I didn't see anything about in EPIC.    Current Outpatient Prescriptions  Medication Sig Dispense Refill  . aspirin EC 325 MG tablet Take 325 mg by mouth daily.    Marland Kitchen atorvastatin (LIPITOR) 40 MG tablet Take 1 tablet (40 mg total) by mouth daily. 30 tablet 11  . Blood Gluc Meter Disp-Strips (BLOOD GLUCOSE METER DISPOSABLE) DEVI 1 strip by Does not apply route 4 (four) times daily -  before  meals and at bedtime. 50 each 11  . Blood Glucose Monitoring Suppl (RELION PRIME MONITOR) DEVI 1 Device by Does not apply route 4 (four) times daily -  with meals and at bedtime. 1 Device 3  . insulin aspart protamine- aspart (NOVOLOG MIX 70/30) (70-30) 100 UNIT/ML injection Inject 15 units in the morning and 15 units in the evening before meals. 10 mL 11  . Insulin Syringes, Disposable, U-100 0.5 ML MISC 1 applicator by Does not apply route 2 (two) times daily with a meal. 100 each 11  . Lancets MISC 1 Device by Does not apply route 4 (four) times daily -  before meals and at bedtime. 100 each 11  . lisinopril (PRINIVIL,ZESTRIL) 10 MG tablet Take 1 tablet (10 mg total) by mouth daily. 30 tablet 11  . metFORMIN (GLUCOPHAGE) 1000 MG tablet Take 1 tablet (1,000 mg total) by mouth 2 (two) times daily with a meal. 60 tablet 11  . metoprolol succinate (TOPROL XL) 100 MG 24 hr tablet Take 1 tablet (100 mg total) by mouth daily. Take with or immediately following a meal. 30 tablet 11  . Omega-3 Fatty Acids (FISH OIL) 1000 MG CAPS Take by mouth daily.    . ranolazine (RANEXA) 1000 MG SR tablet Take 1 tablet (1,000 mg total) by mouth 2 (two) times daily. 180 tablet 3  . sitaGLIPtin (JANUVIA) 100 MG tablet Take 1 tablet (100 mg total) by mouth daily. 30 tablet 11   No current  facility-administered medications for this visit.     No Known Allergies  Social History   Social History  . Marital status: Divorced    Spouse name: N/A  . Number of children: N/A  . Years of education: 60   Occupational History  .  Unemployed   Social History Main Topics  . Smoking status: Former Smoker    Quit date: 10/04/2009  . Smokeless tobacco: Never Used  . Alcohol use No  . Drug use: No  . Sexual activity: Not on file   Other Topics Concern  . Not on file   Social History Narrative   Epworth Sleepiness Scale = 7 (as of 07/28/2015)     Review of Systems: General: negative for chills, fever, night sweats  or weight changes.  Cardiovascular: negative for chest pain, dyspnea on exertion, edema, orthopnea, palpitations, paroxysmal nocturnal dyspnea or shortness of breath Dermatological: negative for rash Respiratory: negative for cough or wheezing Urologic: negative for hematuria Abdominal: negative for nausea, vomiting, diarrhea, bright red blood per rectum, melena, or hematemesis Neurologic: negative for visual changes, syncope, or dizziness All other systems reviewed and are otherwise negative except as noted above.    Blood pressure (!) 144/84, pulse 72, height 5\' 10"  (1.778 m), weight 218 lb 12.8 oz (99.2 kg).  General appearance: alert, cooperative and no distress Neck: no carotid bruit and no JVD Lungs: clear to auscultation bilaterally Heart: regular rate and rhythm Extremities: no edema Skin: Skin color, texture, turgor normal. No rashes or lesions Neurologic: Grossly normal   ASSESSMENT AND PLAN:   S/P CABG (coronary artery bypass graft) 2007 at [facility]. Cath Nov 2016 after abnormal Myoview revealed all SVGs occluded, LIMA-LAD patent, medical Rx  Insulin dependent diabetes mellitus with complications [facility]) Dx March of 2016. GAD65 negative Anti Islet cell negative   History of CVA (cerebrovascular accident) right cerebellar and left basal ganglia infarcts 2015  Essential hypertension EF 57% by Myoview 2016.   PVD (peripheral vascular disease) (HCC) Mild to moderate carotid disease, severe LEA disease-medical Rx  Dyslipidemia LDL 36 May 2017 on Lipitor 40 mg   PLAN  I suggested he go back on Lisinopril 10 mg daily. He can follow up with dr Oval Linsey in 3-4 months. We discussed exertional angina and he'll let us know if he develops any chest pain that sounds like angina. Dr Oval Linsey had referred him cardio pulmonary Rehab previously but he could not afford it.   Kerin Ransom PA-C 09/28/2016 1:14 PM

## 2016-09-28 NOTE — Assessment & Plan Note (Signed)
right cerebellar and left basal ganglia infarcts 2015

## 2016-09-28 NOTE — Assessment & Plan Note (Signed)
EF 57% by Myoview 2016.

## 2016-09-28 NOTE — Assessment & Plan Note (Signed)
LDL 36 May 2017 on Lipitor 40 mg

## 2016-09-28 NOTE — Assessment & Plan Note (Signed)
2007 at [facility]. Cath Nov 2016 after abnormal Myoview revealed all SVGs occluded, LIMA-LAD patent, medical Rx

## 2016-11-01 MED FILL — ATORVASTATIN 40 MG TABLET: 40 | 30 days supply | Qty: 30 | Fill #4

## 2016-11-03 MED FILL — JANUVIA 100 MG TABLET: 100 | 30 days supply | Qty: 30 | Fill #3

## 2016-11-03 MED FILL — METOPROLOL SUCC ER 100 MG T: 100 | 30 days supply | Qty: 30 | Fill #3

## 2016-11-04 ENCOUNTER — Encounter: Payer: Self-pay | Admitting: Internal Medicine

## 2016-11-04 ENCOUNTER — Ambulatory Visit (INDEPENDENT_AMBULATORY_CARE_PROVIDER_SITE_OTHER): Payer: No Typology Code available for payment source | Admitting: Internal Medicine

## 2016-11-04 VITALS — BP 128/72 | HR 84 | Temp 98.9°F | Ht 70.0 in | Wt 218.1 lb

## 2016-11-04 DIAGNOSIS — L821 Other seborrheic keratosis: Secondary | ICD-10-CM

## 2016-11-04 DIAGNOSIS — Z7984 Long term (current) use of oral hypoglycemic drugs: Secondary | ICD-10-CM

## 2016-11-04 DIAGNOSIS — I209 Angina pectoris, unspecified: Secondary | ICD-10-CM

## 2016-11-04 DIAGNOSIS — E1151 Type 2 diabetes mellitus with diabetic peripheral angiopathy without gangrene: Secondary | ICD-10-CM

## 2016-11-04 DIAGNOSIS — I208 Other forms of angina pectoris: Secondary | ICD-10-CM

## 2016-11-04 DIAGNOSIS — I10 Essential (primary) hypertension: Secondary | ICD-10-CM

## 2016-11-04 DIAGNOSIS — Z87891 Personal history of nicotine dependence: Secondary | ICD-10-CM

## 2016-11-04 DIAGNOSIS — Z79899 Other long term (current) drug therapy: Secondary | ICD-10-CM

## 2016-11-04 LAB — GLUCOSE, CAPILLARY: Glucose-Capillary: 160 mg/dL — ABNORMAL HIGH (ref 65–99)

## 2016-11-04 NOTE — Patient Instructions (Signed)
Keep up the good work. I do not think you need to take the Novolog 70/30 insulin anymore.  However if you noticed your before breakfast or before dinner sugar is above 160 (and you are about to eat) you may inject 10 units.

## 2016-11-10 NOTE — Assessment & Plan Note (Signed)
HPI: He has been takingmetformin 1g BID as well as occasionally taking 15 units of Novolog 70/30 BID only when his sugars are high premeal. He is checking his sugars and brought his glucometer for review,  Upon review he has no lows,in the last 3 months it appears he has had very good control although he has occasionally been checking the sugars after meals (he thought that I had asked him to do this, which I do not believe is the case).  He has only had 2 days with significant hyperglycemia up to 350 and 300 respectively.  A: Controlled Type 2 DM with peripheral angiopathy.  P:  I congratulated him on his hard work.  We will continue metformin 1g BID, Januvia 100mg  daily Discussed that he may inject 10 units of Novlog 70/30 if sugars are high premeal but that he is doing well.  I requested he only check sugars twice a day before breakfast and dinner, unless symptomatic.

## 2016-11-10 NOTE — Progress Notes (Signed)
[facility] Subjective:  HPI: [name] is a 61 y.o. male who presents for Follow-up for coronary artery disease, hypertension, diabetes. Please see problem based charting below for the status of his chronic medical issues.     Review of Systems: Per HPI Objective:  Physical Exam: Vitals:   11/04/16 1106  BP: 128/72  Pulse: 84  Temp: 98.9 F (37.2 C)  TempSrc: Oral  SpO2: 100%  Weight: 218 lb 1.6 oz (98.9 kg)  Height: 5\' 10"  (1.778 m)  Physical Exam  Constitutional: He is well-developed, well-nourished, and in no distress. No distress.  HENT:  Head: Normocephalic and atraumatic.  Eyes: Conjunctivae are normal.  Cardiovascular: Normal rate, regular rhythm, normal heart sounds and intact distal pulses.   No murmur heard. Pulmonary/Chest: Effort normal and breath sounds normal. No respiratory distress. He has no wheezes. He has no rales.  Abdominal: Soft. Bowel sounds are normal. He exhibits no distension. There is no tenderness.  Musculoskeletal: He exhibits no edema.  Skin: Skin is warm and dry. He is not diaphoretic.  1cm skin lesion, flaky with stuck on appearance on left side of face lateral to eye. Multiple small similar appearing lesions on right dorsal forearm.  Psychiatric: Affect and judgment normal.  Nursing note and vitals reviewed.   Assessment & Plan:  Essential hypertension HPI: He has been taking Lisinopril 10mg  daily and Toprol XL 100mg  daily.  He has had very infrequent episdoes of ortho stasis when standing quickly  A: Essential HTN at goal  P: Continue lisinopril 10mg  daily Metoprolol succinate 100mg  daily (HR improved but not completely to goal, did discuss with patient about increasing to 200mg  for his CAD but patient wants to discuss with Dr Oval Linsey. Cautioned to stand slowly to decrease orthostasis episodes.   Controlled type 2 diabetes mellitus with diabetic peripheral angiopathy without gangrene (HCC) HPI: He  has been takingmetformin 1g BID as well as occasionally taking 15 units of Novolog 70/30 BID only when his sugars are high premeal. He is checking his sugars and brought his glucometer for review,  Upon review he has no lows,in the last 3 months it appears he has had very good control although he has occasionally been checking the sugars after meals (he thought that I had asked him to do this, which I do not believe is the case).  He has only had 2 days with significant hyperglycemia up to 350 and 300 respectively.  A: Controlled Type 2 DM with peripheral angiopathy.  P:  I congratulated him on his hard work.  We will continue metformin 1g BID, Januvia 100mg  daily Discussed that he may inject 10 units of Novlog 70/30 if sugars are high premeal but that he is doing well.  I requested he only check sugars twice a day before breakfast and dinner, unless symptomatic.     Seborrheic keratoses HPI: He asks about skin growths on left side on face and right hand.  A: Seborrheic keratoses  P: Discussed we can remove if bothersome.  Angina, class II ([place]) HPI: Continues to have some mild chest pain with exertion.  Ranexa has helped this.  With the cold weather he has not been able to increase exercise much.  A: Stable angina  P: Currently medically managed, followed by Dr Oval Linsey and has appointment next month.  I discussed we have not maximized HR control with Toprol XL but we wants to hold off until his appointment with Dr Oval Linsey.  Overall he reports this is  grossly improved.  I cautioned him to let me know if it is worsening.   Medications Ordered No orders of the defined types were placed in this encounter.  Other Orders Orders Placed This Encounter  Procedures  . Glucose, capillary   Follow Up: Return 2-3 months.

## 2016-11-10 NOTE — Assessment & Plan Note (Signed)
HPI: Continues to have some mild chest pain with exertion.  Ranexa has helped this.  With the cold weather he has not been able to increase exercise much.  A: Stable angina  P: Currently medically managed, followed by Dr Oval Linsey and has appointment next month.  I discussed we have not maximized HR control with Toprol XL but we wants to hold off until his appointment with Dr Oval Linsey.  Overall he reports this is grossly improved.  I cautioned him to let me know if it is worsening.

## 2016-11-10 NOTE — Assessment & Plan Note (Signed)
HPI: He asks about skin growths on left side on face and right hand.  A: Seborrheic keratoses  P: Discussed we can remove if bothersome.

## 2016-11-10 NOTE — Assessment & Plan Note (Signed)
HPI: He has been taking Lisinopril 10mg  daily and Toprol XL 100mg  daily.  He has had very infrequent episdoes of ortho stasis when standing quickly  A: Essential HTN at goal  P: Continue lisinopril 10mg  daily Metoprolol succinate 100mg  daily (HR improved but not completely to goal, did discuss with patient about increasing to 200mg  for his CAD but patient wants to discuss with Dr Oval Linsey. Cautioned to stand slowly to decrease orthostasis episodes.

## 2016-12-02 MED FILL — ATORVASTATIN 40 MG TABLET: 40 | 30 days supply | Qty: 30 | Fill #5

## 2016-12-08 MED FILL — METOPROLOL SUCC ER 100 MG T: 100 | 30 days supply | Qty: 30 | Fill #4

## 2016-12-08 MED FILL — JANUVIA 100 MG TABLET: 100 | 30 days supply | Qty: 30 | Fill #4

## 2016-12-15 IMAGING — DX DG CHEST 2V
2 series · 2 of 2 positions shown · non-contrast
Comparison: None.

CLINICAL DATA: Dyspnea.  Fall.

EXAM:
CHEST  2 VIEW

[chest pa]
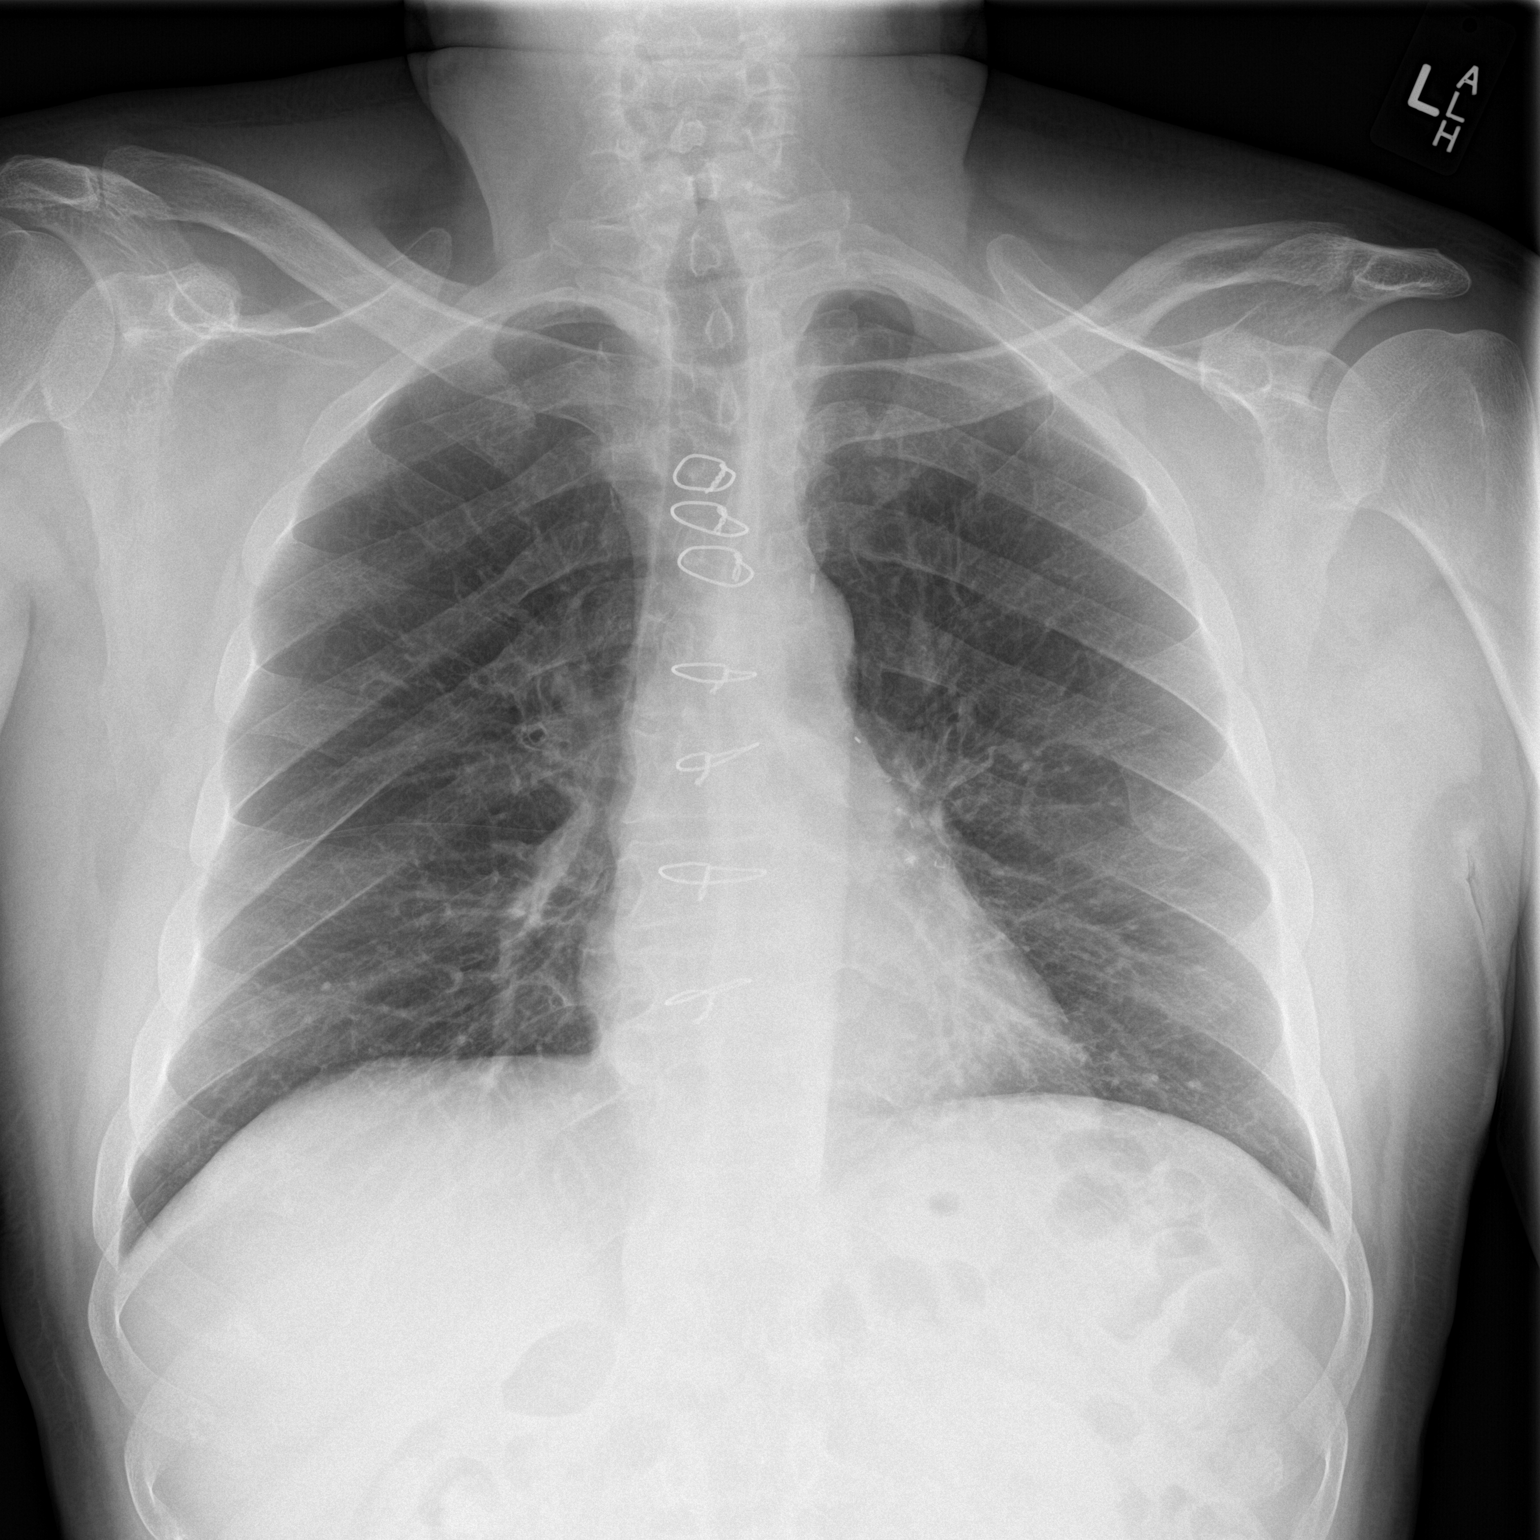

[chest lat]
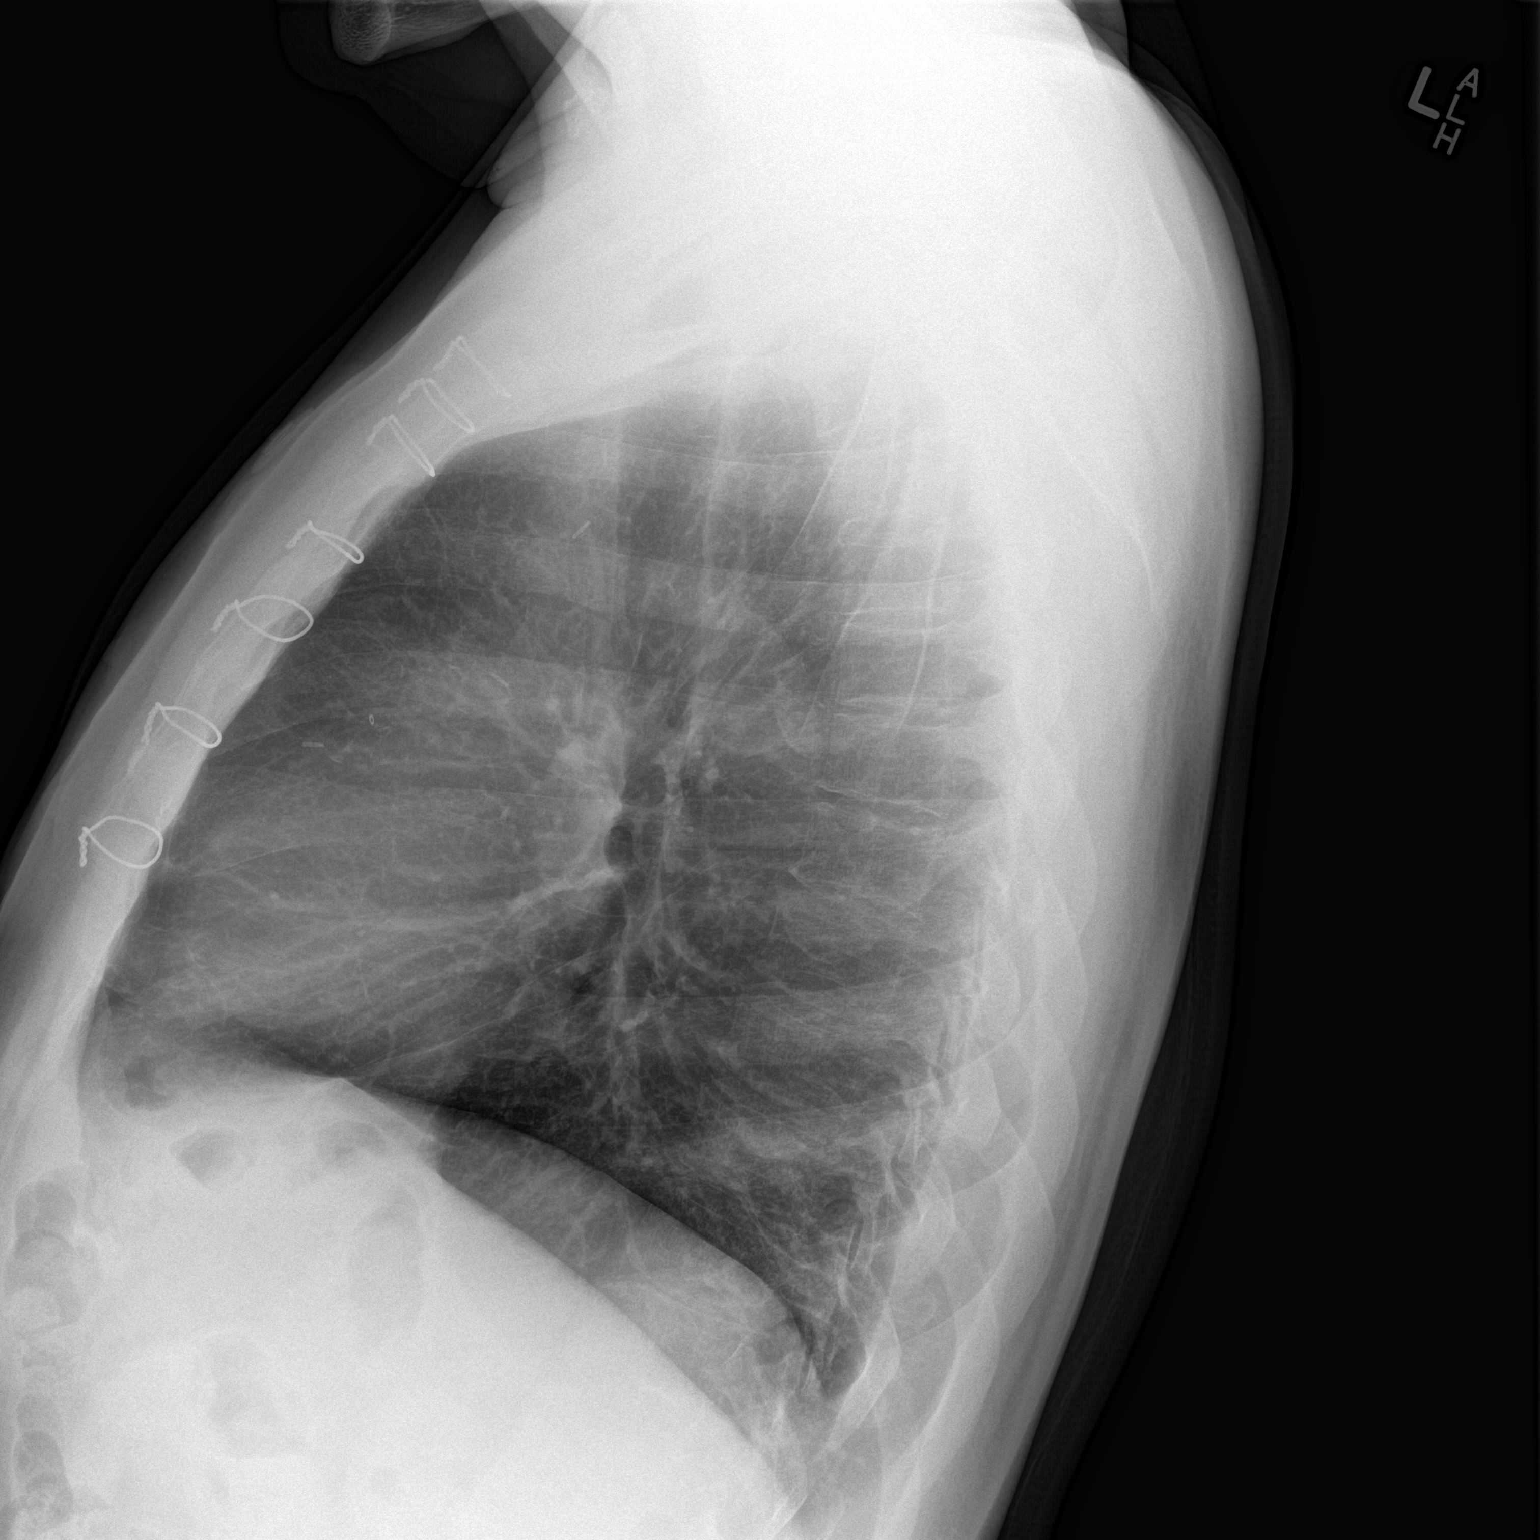

[2 of 2 positions shown; findings below may reference images not displayed]

FINDINGS: Normal heart size and aortic contours post CABG. There is no edema,
consolidation, effusion, or pneumothorax. No acute osseous findings
related to reported fall.
IMPRESSION: No active cardiopulmonary disease.

## 2016-12-28 ENCOUNTER — Other Ambulatory Visit: Payer: Self-pay | Admitting: *Deleted

## 2016-12-28 DIAGNOSIS — Z794 Long term (current) use of insulin: Principal | ICD-10-CM

## 2016-12-28 DIAGNOSIS — E119 Type 2 diabetes mellitus without complications: Secondary | ICD-10-CM

## 2016-12-28 MED ORDER — INSULIN ASPART PROT & ASPART (70-30 MIX) 100 UNIT/ML ~~LOC~~ SUSP: SUBCUTANEOUS | 2 refills | Status: DC

## 2016-12-28 MED FILL — ATORVASTATIN 40 MG TABLET: 40 | 30 days supply | Qty: 30 | Fill #6

## 2016-12-28 MED FILL — LISINOPRIL 10 MG TABLET: 10 | 90 days supply | Qty: 90 | Fill #1

## 2016-12-29 ENCOUNTER — Encounter: Payer: Self-pay | Admitting: Cardiovascular Disease

## 2016-12-29 ENCOUNTER — Ambulatory Visit (INDEPENDENT_AMBULATORY_CARE_PROVIDER_SITE_OTHER): Payer: No Typology Code available for payment source | Admitting: Cardiovascular Disease

## 2016-12-29 VITALS — BP 118/71 | HR 80 | Ht 71.0 in | Wt 214.0 lb

## 2016-12-29 DIAGNOSIS — I119 Hypertensive heart disease without heart failure: Secondary | ICD-10-CM

## 2016-12-29 DIAGNOSIS — I208 Other forms of angina pectoris: Secondary | ICD-10-CM

## 2016-12-29 DIAGNOSIS — E7800 Pure hypercholesterolemia, unspecified: Secondary | ICD-10-CM

## 2016-12-29 DIAGNOSIS — I6523 Occlusion and stenosis of bilateral carotid arteries: Secondary | ICD-10-CM

## 2016-12-29 DIAGNOSIS — I739 Peripheral vascular disease, unspecified: Secondary | ICD-10-CM

## 2016-12-29 MED ORDER — ISOSORBIDE MONONITRATE ER 30 MG PO TB24: 30.0000 mg | ORAL_TABLET | Freq: Every day | ORAL | 5 refills | Status: DC

## 2016-12-29 NOTE — Progress Notes (Signed)
Cardiology Office Note   Date:  12/29/2016   ID:  Jason Velasquez, DOB Apr 11, 1956, MRN [id number]  PCP:  Jason Groves, DO  Cardiologist:   Jason Latch, MD   Chief Complaint  Patient presents with  . Follow-up    Pt states no Sx.      Patient ID: Jason Velasquez is a 61 y.o. male with CAD s/p CABG, chronic stable angina, PAD, hypertension, diabetes and CVA who presents for follow up.  [name] was first evaluated for chest pain on 07/27/15.  He previously underwent CABG in 2007. He had a nuclear stress test on 08/21/15 revealed LVEF 57% with moderate ischemia in the inferoseptal, inferior, and anterior locations. He subsequently underwent left heart catheterization on 08/26/15 that revealed severe three vessel disease including a 90% left main lesion and an occluded RCA and LAD. His LIMA to the LAD was patent but all other vein grafts were occluded. There were no optimal PCI targets.  He was started on carvedilol and Ranexa. [name] also underwent ABI testing that revealed diffuse atherosclerosis from the proximal abdominal aorta through the common and external iliac arteries bilaterally. There was no detectable flow in the L peroneal or anterior tibial arteries. ABIs were near 0.8 bilaterally. He was referred to Dr. Gwenlyn Found where medical management was advised.  Ranexa  was increased to 1000 mg twice a day and his symptoms improved.  [name] was noted to have carotid bruits and was referred for carotid ultrasound on 12/09/15.  This revealed 40-59% stenosis on the right and 1-39% stenosis on the left.  Since his last appointment [name] reports progression of his symptoms. 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  He has to stop several times due to chest pain and leg pain. The chest pain last for 5-20 minutes and only improves with rest. He also notes that his breathing has been worse lately. He has to limit his activity due to the symptoms. He's been  thinking of retiring but is afraid of how this will affect his budget. He denies lower extremity edema, orthopnea, or PND.   Past Medical History:  Diagnosis Date  . CAD (coronary artery disease) of artery bypass graft 06/25/2014   CABG- 2007   . CAD (coronary artery disease), native coronary artery 06/25/2014   CABG- 2007   . Carotid stenosis 03/03/2016   83-41% RICA, 9-62% LICA, >22% RECA stenosis Repeat 12/2016.  . Claudication ([place])   . Controlled type 2 diabetes mellitus without complication ([place]) 06/11/9891   Dx March of 2016. GAD65 negative Anti Islet cell negative    . DKA (diabetic ketoacidoses) ([place]) 12/07/2014  . History of CVA (cerebrovascular accident) 07/12/2014  . Hx of adenomatous colonic polyps 05/27/2015  . Hypercholesterolemia   . Hypertension   . Hypertensive heart disease 03/03/2016  . S/P CABG (coronary artery bypass graft) 03/28/2015   2007 at [place] [facility]   . Stroke ([place])    2015  . Tubular adenoma of colon 05/31/2015   Colonoscopy 05/20/15 with [name]: 3 tubullar adenoma's largest 2.3cm.  Plan for repeat Colonoscopy in 2019.    Past Surgical History:  Procedure Laterality Date  . CARDIAC CATHETERIZATION N/A 08/26/2015   Procedure: Left Heart Cath and Cors/Grafts Angiography;  Surgeon: [name], MD;  Location: [facility];  Service: Cardiovascular;  Laterality: N/A;  . CARDIAC SURGERY    . CORONARY ARTERY BYPASS GRAFT  Current Outpatient Prescriptions  Medication Sig Dispense Refill  . aspirin EC 325 MG tablet Take 325 mg by mouth daily.    Marland Kitchen atorvastatin (LIPITOR) 40 MG tablet Take 1 tablet (40 mg total) by mouth daily. 30 tablet 11  . Blood Gluc Meter Disp-Strips (BLOOD GLUCOSE METER DISPOSABLE) DEVI 1 strip by Does not apply route 4 (four) times daily -  before meals and at bedtime. 50 each 11  . Blood Glucose Monitoring Suppl (RELION PRIME MONITOR) DEVI 1 Device by Does not apply route 4 (four) times daily -  with meals and at  bedtime. 1 Device 3  . insulin aspart protamine- aspart (NOVOLOG MIX 70/30) (70-30) 100 UNIT/ML injection Inject 10 units into the skin premeal when blood sugar is above 160 10 mL 2  . Insulin Syringes, Disposable, U-100 0.5 ML MISC 1 applicator by Does not apply route 2 (two) times daily with a meal. 100 each 11  . Lancets MISC 1 Device by Does not apply route 4 (four) times daily -  before meals and at bedtime. 100 each 11  . lisinopril (PRINIVIL,ZESTRIL) 10 MG tablet Take 1 tablet (10 mg total) by mouth daily. 90 tablet 3  . metFORMIN (GLUCOPHAGE) 1000 MG tablet Take 1 tablet (1,000 mg total) by mouth 2 (two) times daily with a meal. 60 tablet 11  . metoprolol succinate (TOPROL XL) 100 MG 24 hr tablet Take 1 tablet (100 mg total) by mouth daily. Take with or immediately following a meal. 30 tablet 11  . Omega-3 Fatty Acids (FISH OIL) 1000 MG CAPS Take by mouth daily.    . ranolazine (RANEXA) 1000 MG SR tablet Take 1 tablet (1,000 mg total) by mouth 2 (two) times daily. 180 tablet 3  . sitaGLIPtin (JANUVIA) 100 MG tablet Take 1 tablet (100 mg total) by mouth daily. 30 tablet 11  . isosorbide mononitrate (IMDUR) 30 MG 24 hr tablet Take 1 tablet (30 mg total) by mouth daily. 30 tablet 5   No current facility-administered medications for this visit.     Allergies:   Patient has no known allergies.    Social History:  The patient  reports that he quit smoking about 7 years ago. He has never used smokeless tobacco. He reports that he does not drink alcohol or use drugs.   Family History:  The patient's family history includes Diabetes in his father; Hypertension in his mother and sister.    ROS:  Please see the history of present illness.   Otherwise, review of systems are positive for none.   All other systems are reviewed and negative.    PHYSICAL EXAM: VS:  BP 118/71   Pulse 80   Ht 5\' 11"  (1.803 m)   Wt 97.1 kg (214 lb)   BMI 29.85 kg/m  , BMI Body mass index is 29.85  kg/m. GENERAL:  Well appearing HEENT:  Pupils equal round and reactive, fundi not visualized, oral mucosa unremarkable NECK:  No jugular venous distention, waveform within normal limits, carotid upstroke brisk and symmetric, slight L carotid bruit. LYMPHATICS:  No cervical adenopathy LUNGS:  Clear to auscultation bilaterally HEART:  RRR.  PMI not displaced or sustained,S1 and S2 within normal limits, no S3, no S4, no clicks, no rubs, no murmurs ABD:  Flat, positive bowel sounds normal in frequency in pitch, no bruits, no rebound, no guarding, no midline pulsatile mass, no hepatomegaly, no splenomegaly EXT:  1+ DP/PT bilaterally, no edema, no cyanosis no clubbing SKIN:  No rashes  no nodules NEURO:  Cranial nerves II through XII grossly intact, motor grossly intact throughout PSYCH:  Cognitively intact, oriented to person place and time   EKG:  EKG is ordered today. The ekg ordered today demonstrates sinus rhythm rate 82 bpm.  Prior inferior infarct.  07/02/16: Sinus rhythm rate 75 bpm.  Prior inferior infarct  12/29/16: Sinus rhythm.  Incomplete RBBB.  Prior inferior infarct.   Lexiscan Cardiolite 08/21/15:  Nuclear stress EF: 57%.  The left ventricular ejection fraction is normal (55-65%).  There was no ST segment deviation noted during stress.  No T wave inversion was noted during stress.  Defect 1: There is a small defect of moderate severity present in the basal inferoseptal and basal inferior location.  Defect 2: There is a defect present in the basal anterior, mid anterior and apical anterior location.  Findings consistent with ischemia.  This is a high risk study.  High risk stress nuclear study with two areas of ischemia and normal left ventricular regional and global systolic function. The inferior area of ischemia is small. The anterior area of ischemia is extensive, but appears to be very mild. The septum is spared.  LHC 08/26/15:  LM lesion, 90% stenosed.  Involving both LAD and circumflex ostia  Prox RCA lesion, 90% stenosed. Mid RCA lesion, 90% stenosed. Dist RCA lesion, 100% stenosed.  SVG -(presumably to distal RCA) was not visualized . Likely flush occluded  Ost LAD lesion, 80% stenosed.  Ost Cx lesion, 90% stenosed. Prox Cx lesion, 80% stenosed.  Ost 1st Mrg to 1st Mrg lesion, 85% stenosed.  SVG-OM1 Prox Graft to Dist Graft lesion, 100% stenosed. Short stump noted at the ostium as well as at insertion site, this would suggest chronic occlusion  Mid Cx lesion, 95% stenosed. 3rd Mrg lesion, 100% stenosed.  Ost LAD to Mid LAD lesion, dilated, ectatic disease prior to 100% occlusion.  LIMA-LAD was injected is normal in caliber, widely patent to a wraparound LAD   Severe native vessel disease including left main stenosis with occluded RCA and LAD. Also subtotal occluded OM1 and total occluded OM 3. It is not clear what the grafts went to but both vein grafts are occluded.  LIMA-LAD is widely patent.   Carotid Doppler 12/11/15: 01-60% RICA, 1-09% LICA, >32% RECA stenosis  Recent Labs: 07/29/2016: BUN 12; Creatinine, Ser 1.29; Potassium 4.5; Sodium 140    Lipid Panel    Component Value Date/Time   CHOL 121 (L) 03/03/2016 1503   CHOL 129 07/10/2015 1418   TRIG 320 (H) 03/03/2016 1503   HDL 21 (L) 03/03/2016 1503   HDL 21 (L) 07/10/2015 1418   CHOLHDL 5.8 (H) 03/03/2016 1503   VLDL 64 (H) 03/03/2016 1503   LDLCALC 36 03/03/2016 1503   LDLCALC 45 07/10/2015 1418      Wt Readings from Last 3 Encounters:  12/29/16 97.1 kg (214 lb)  11/04/16 98.9 kg (218 lb 1.6 oz)  09/28/16 99.2 kg (218 lb 12.8 oz)      ASSESSMENT AND PLAN:  # Chronic stable angina: # CAD s/p CABG: Mr. Moulder has severe 3 vessel CAD and all his CABG grafts are occluded with the exception of his LIMA to the LAD.  His angina was better controlled after adding Ranexa, but lately it has been worse.  In the past he has been reluctant to try nitrate but is  now willing.  We will add Imdur 30 mg daily.  Continue lisinopril, carvedilol, aspirin, rosuvastatin and ranolazine.  He  was last had LHC by Dr. Ellyn Hack in 2016.  Will discuss next steps with him.  He does not have insurance, but we will try to enroll him in cardiac rehab.   # Hypertension: BP is well-controlled. Continue lisinopril, metoprolol, and adding Imdur as above.   # Hyperlipidemia:  LDL 36 02/2016.  Fish oil added due to hypertriglyceridemia.   # Claudication: ABIs 0.8 bilaterally and medical management was recommended in 2016.Marland Kitchen  Symptoms are progressing. We will obtain repeat lower extremity Dopplers and ABIs. Refer to Dr. Gwenlyn Found for continued management. Continue aspirin and atorvastatin.  Cardiac rehab as above.  # Carotid stenosis: Carotid Doppler revealed 50-49% R ICA stenosis and 1-39% stenosis on the left.  Repeat Dopplers.  Continue aspirin and atorvastatin.   Current medicines are reviewed at length with the patient today.  The patient does not have concerns regarding medicines.  The following changes have been made:  Add Imdur 30 mg daily.  Labs/ tests ordered today include:   No orders of the defined types were placed in this encounter.    Disposition:   FU with Marlette Curvin C. Oval Linsey, MD in 1 month.    Signed, Jason Latch, MD  12/29/2016 4:56 PM    [place]

## 2016-12-29 NOTE — Patient Instructions (Addendum)
Medication Instructions:  START ISOSORBIDE 60 MG DAILY   Labwork: NONE  Testing/Procedures: Your physician has requested that you have a carotid duplex. This test is an ultrasound of the carotid arteries in your neck. It looks at blood flow through these arteries that supply the brain with blood. Allow one hour for this exam. There are no restrictions or special instructions.  Your physician has requested that you have an ankle brachial index (ABI). During this test an ultrasound and blood pressure cuff are used to evaluate the arteries that supply the arms and legs with blood. Allow thirty minutes for this exam. There are no restrictions or special instructions.  Follow-Up: Your physician recommends that you schedule a follow-up appointment in: DR [facility]ERR[facility] AFTER STUDIES   Your physician recommends that you schedule a follow-up appointment in: DR [facility]  IN 1 MONTH   If you need a refill on your cardiac medications before your next appointment, please call your pharmacy.

## 2017-01-04 MED FILL — JANUVIA 100 MG TABLET: 100 | 30 days supply | Qty: 30 | Fill #5

## 2017-01-04 MED FILL — METOPROLOL SUCC ER 100 MG T: 100 | 30 days supply | Qty: 30 | Fill #5

## 2017-01-21 ENCOUNTER — Ambulatory Visit: Payer: No Typology Code available for payment source

## 2017-01-24 ENCOUNTER — Ambulatory Visit: Payer: No Typology Code available for payment source

## 2017-01-25 ENCOUNTER — Ambulatory Visit: Payer: No Typology Code available for payment source | Admitting: Cardiovascular Disease

## 2017-01-26 ENCOUNTER — Telehealth: Payer: Self-pay | Admitting: Internal Medicine

## 2017-01-26 NOTE — Telephone Encounter (Signed)
APT. REMINDER CALL, LMTCB °

## 2017-01-27 ENCOUNTER — Ambulatory Visit: Payer: No Typology Code available for payment source

## 2017-01-28 ENCOUNTER — Other Ambulatory Visit: Payer: Self-pay | Admitting: Cardiovascular Disease

## 2017-01-28 DIAGNOSIS — I6523 Occlusion and stenosis of bilateral carotid arteries: Secondary | ICD-10-CM

## 2017-01-28 DIAGNOSIS — I739 Peripheral vascular disease, unspecified: Secondary | ICD-10-CM

## 2017-01-28 MED FILL — ATORVASTATIN 40 MG TABLET: 40 | 30 days supply | Qty: 30 | Fill #7

## 2017-02-03 ENCOUNTER — Ambulatory Visit (HOSPITAL_COMMUNITY)
Admission: RE | Admit: 2017-02-03 | Discharge: 2017-02-03 | Disposition: A | Discharge status: Home / Self Care | Payer: No Typology Code available for payment source | Source: Ambulatory Visit | Attending: Cardiology | Admitting: Cardiology

## 2017-02-03 DIAGNOSIS — Z8673 Personal history of transient ischemic attack (TIA), and cerebral infarction without residual deficits: Secondary | ICD-10-CM | POA: Insufficient documentation

## 2017-02-03 DIAGNOSIS — Z87891 Personal history of nicotine dependence: Secondary | ICD-10-CM | POA: Insufficient documentation

## 2017-02-03 DIAGNOSIS — I6523 Occlusion and stenosis of bilateral carotid arteries: Secondary | ICD-10-CM

## 2017-02-03 DIAGNOSIS — E1151 Type 2 diabetes mellitus with diabetic peripheral angiopathy without gangrene: Secondary | ICD-10-CM | POA: Insufficient documentation

## 2017-02-03 DIAGNOSIS — I2510 Atherosclerotic heart disease of native coronary artery without angina pectoris: Secondary | ICD-10-CM | POA: Insufficient documentation

## 2017-02-03 DIAGNOSIS — I10 Essential (primary) hypertension: Secondary | ICD-10-CM | POA: Insufficient documentation

## 2017-02-03 DIAGNOSIS — R938 Abnormal findings on diagnostic imaging of other specified body structures: Secondary | ICD-10-CM | POA: Insufficient documentation

## 2017-02-03 DIAGNOSIS — I739 Peripheral vascular disease, unspecified: Secondary | ICD-10-CM

## 2017-02-03 DIAGNOSIS — Z951 Presence of aortocoronary bypass graft: Secondary | ICD-10-CM | POA: Insufficient documentation

## 2017-02-07 ENCOUNTER — Other Ambulatory Visit: Payer: Self-pay | Admitting: *Deleted

## 2017-02-07 MED ORDER — RANOLAZINE ER 1000 MG PO TB12: 1000.0000 mg | ORAL_TABLET | Freq: Two times a day (BID) | ORAL | 3 refills | Status: DC

## 2017-02-10 ENCOUNTER — Ambulatory Visit (INDEPENDENT_AMBULATORY_CARE_PROVIDER_SITE_OTHER): Payer: No Typology Code available for payment source | Admitting: Cardiovascular Disease

## 2017-02-10 ENCOUNTER — Ambulatory Visit: Payer: No Typology Code available for payment source | Admitting: Cardiovascular Disease

## 2017-02-10 ENCOUNTER — Encounter: Payer: Self-pay | Admitting: Cardiovascular Disease

## 2017-02-10 VITALS — BP 117/75 | HR 82 | Ht 71.0 in | Wt 210.6 lb

## 2017-02-10 DIAGNOSIS — I119 Hypertensive heart disease without heart failure: Secondary | ICD-10-CM

## 2017-02-10 DIAGNOSIS — I739 Peripheral vascular disease, unspecified: Secondary | ICD-10-CM

## 2017-02-10 DIAGNOSIS — Z8673 Personal history of transient ischemic attack (TIA), and cerebral infarction without residual deficits: Secondary | ICD-10-CM

## 2017-02-10 DIAGNOSIS — I25119 Atherosclerotic heart disease of native coronary artery with unspecified angina pectoris: Secondary | ICD-10-CM

## 2017-02-10 DIAGNOSIS — E7800 Pure hypercholesterolemia, unspecified: Secondary | ICD-10-CM

## 2017-02-10 DIAGNOSIS — I209 Angina pectoris, unspecified: Secondary | ICD-10-CM

## 2017-02-10 DIAGNOSIS — I6523 Occlusion and stenosis of bilateral carotid arteries: Secondary | ICD-10-CM

## 2017-02-10 NOTE — Patient Instructions (Addendum)
Medication Instructions:  START ISOSORBIDE 30 MG DAILY  Labwork: FASTING LP/CMET AT [facility] ON THE FIRST FLOOR   Testing/Procedures: NONE   Follow-Up: Your physician recommends that you schedule a follow-up appointment in: 3 MONTH OV  Any Other Special Instructions Will Be Listed Below (If Applicable). WILL ARRANGE FOR YOU TO GET IN WITH CARDIAC REHAB IF YOU DO NOT HEAR FROM THEM BY MID WEEK NEXT WEEK CALL THE OFFICE AT [phone] AND ASK FOR [place]   If you need a refill on your cardiac medications before your next appointment, please call your pharmacy.

## 2017-02-10 NOTE — Addendum Note (Signed)
Addended by: Alvina Filbert B on: 02/10/2017 10:22 AM   Modules accepted: Orders

## 2017-02-10 NOTE — Progress Notes (Signed)
Cardiology Office Note   Date:  02/10/2017   ID:  Jason Velasquez, DOB 05-04-1956, MRN [id number]  PCP:  Jason Groves, DO  Cardiologist:   Skeet Latch, MD   Chief Complaint  Patient presents with  . Follow-up  . Dizziness    occasionally.  . Leg Pain    cramping in legs frequently.      Patient ID: Jason Velasquez is a 61 y.o. male with CAD s/p CABG, chronic stable angina, PAD, hypertension, diabetes and CVA who presents for follow up.  Jason Velasquez was first evaluated for chest pain on 07/27/15.  He previously underwent CABG in 2007. He had a nuclear stress test on 08/21/15 revealed LVEF 57% with moderate ischemia in the inferoseptal, inferior, and anterior locations. He subsequently underwent left heart catheterization on 08/26/15 that revealed severe three vessel disease including a 90% left main lesion and an occluded RCA and LAD. His LIMA to the LAD was patent but all other vein grafts were occluded. There were no optimal PCI targets.  He was started on carvedilol and Ranexa. Jason Velasquez also underwent ABI testing 08/2015 that revealed diffuse atherosclerosis from the proximal abdominal aorta through the common and external iliac arteries bilaterally. There was no detectable flow in the L peroneal or anterior tibial arteries. ABIs were near 0.8 bilaterally. He was referred to Dr. Gwenlyn Found where medical management was advised. He was noted to have carotid bruits and was referred for carotid ultrasound on 12/09/15.  This revealed 40-59% stenosis on the right and 1-39% stenosis on the left.  Jason Velasquez has continued to struggle with angina. Ranexa  was increased to 1000 mg twice a day, however, lately he has increased angina. He gets claudication in his legs and angina while at work. 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 He is worried that he will have to quit his job because he is unable to perform.  At his last appointment Imdur  was ordered but he never picked it up at the pharmacy.  He continues to have chest pain and claudication.   Past Medical History:  Diagnosis Date  . Angina, class III ([place]) 08/24/2015  . CAD (coronary artery disease) of artery bypass graft 06/25/2014   CABG- 2007   . CAD (coronary artery disease), native coronary artery 06/25/2014   CABG- 2007   . Carotid stenosis 03/03/2016   08-65% RICA, 7-84% LICA, >69% RECA stenosis Repeat 12/2016.  . Claudication ([place])   . Controlled type 2 diabetes mellitus without complication ([place]) 03/06/9527   Dx March of 2016. GAD65 negative Anti Islet cell negative    . DKA (diabetic ketoacidoses) ([place]) 12/07/2014  . History of CVA (cerebrovascular accident) 07/12/2014  . Hx of adenomatous colonic polyps 05/27/2015  . Hypercholesterolemia   . Hypertension   . Hypertensive heart disease 03/03/2016  . S/P CABG (coronary artery bypass graft) 03/28/2015   2007 at [facility]   . Stroke ([place])    2015  . Tubular adenoma of colon 05/31/2015   Colonoscopy 05/20/15 with [name]: 3 tubullar adenoma's largest 2.3cm.  Plan for repeat Colonoscopy in 2019.    Past Surgical History:  Procedure Laterality Date  . CARDIAC CATHETERIZATION N/A 08/26/2015   Procedure: Left Heart Cath and Cors/Grafts Angiography;  Surgeon: [name], MD;  Location: [facility];  Service: Cardiovascular;  Laterality: N/A;  . CARDIAC SURGERY    . CORONARY ARTERY BYPASS  GRAFT      Current Outpatient Prescriptions  Medication Sig Dispense Refill  . aspirin EC 81 MG tablet Take 81 mg by mouth daily.    Marland Kitchen atorvastatin (LIPITOR) 40 MG tablet Take 1 tablet (40 mg total) by mouth daily. 30 tablet 11  . Blood Gluc Meter Disp-Strips (BLOOD GLUCOSE METER DISPOSABLE) DEVI 1 strip by Does not apply route 4 (four) times daily -  before meals and at bedtime. 50 each 11  . Blood Glucose Monitoring Suppl (RELION PRIME MONITOR) DEVI 1 Device by Does not apply route 4 (four) times daily -   with meals and at bedtime. 1 Device 3  . insulin aspart protamine- aspart (NOVOLOG MIX 70/30) (70-30) 100 UNIT/ML injection Inject 10 units into the skin premeal when blood sugar is above 160 10 mL 2  . Insulin Syringes, Disposable, U-100 0.5 ML MISC 1 applicator by Does not apply route 2 (two) times daily with a meal. 100 each 11  . isosorbide mononitrate (IMDUR) 30 MG 24 hr tablet Take 1 tablet (30 mg total) by mouth daily. 30 tablet 5  . Lancets MISC 1 Device by Does not apply route 4 (four) times daily -  before meals and at bedtime. 100 each 11  . lisinopril (PRINIVIL,ZESTRIL) 10 MG tablet Take 1 tablet (10 mg total) by mouth daily. 90 tablet 3  . metFORMIN (GLUCOPHAGE) 1000 MG tablet Take 1 tablet (1,000 mg total) by mouth 2 (two) times daily with a meal. 60 tablet 11  . metoprolol succinate (TOPROL XL) 100 MG 24 hr tablet Take 1 tablet (100 mg total) by mouth daily. Take with or immediately following a meal. 30 tablet 11  . Omega-3 Fatty Acids (FISH OIL) 1000 MG CAPS Take by mouth daily.    . ranolazine (RANEXA) 1000 MG SR tablet Take 1 tablet (1,000 mg total) by mouth 2 (two) times daily. 180 tablet 3  . sitaGLIPtin (JANUVIA) 100 MG tablet Take 1 tablet (100 mg total) by mouth daily. 30 tablet 11   No current facility-administered medications for this visit.     Allergies:   Patient has no known allergies.    Social History:  The patient  reports that he quit smoking about 7 years ago. He has never used smokeless tobacco. He reports that he does not drink alcohol or use drugs.   Family History:  The patient's family history includes Diabetes in his father; Hypertension in his mother and sister.    ROS:  Please see the history of present illness.   Otherwise, review of systems are positive for none.   All other systems are reviewed and negative.    PHYSICAL EXAM: VS:  BP 117/75   Pulse 82   Ht 5\' 11"  (1.803 m)   Wt 95.5 kg (210 lb 9.6 oz)   BMI 29.37 kg/m  , BMI Body mass index  is 29.37 kg/m. GENERAL:  Well appearing.  No acute distress HEENT:  Pupils equal round and reactive, fundi not visualized, oral mucosa unremarkable NECK:  No jugular venous distention, waveform within normal limits, carotid upstroke brisk and symmetric, no carotid bruit. LYMPHATICS:  No cervical adenopathy LUNGS:  Clear to auscultation bilaterally.  No crackles, rhonchi or wheezes. HEART:  RRR.  PMI not displaced or sustained,S1 and S2 within normal limits, no S3, no S4, no clicks, no rubs, no murmurs ABD:  Flat, positive bowel sounds normal in frequency in pitch, no bruits, no rebound, no guarding, no midline pulsatile mass, no hepatomegaly,  no splenomegaly EXT:  1+ R PT.  Absent DP.  Absent L PT or DP.  No edema, no cyanosis no clubbing SKIN:  No rashes no nodules.  Lack of hair in lower tibia NEURO:  Cranial nerves II through XII grossly intact, motor grossly intact throughout PSYCH:  Cognitively intact, oriented to person place and time   EKG:  EKG is ordered today. The ekg ordered today demonstrates sinus rhythm rate 82 bpm.  Prior inferior infarct.  07/02/16: Sinus rhythm rate 75 bpm.  Prior inferior infarct  12/29/16: Sinus rhythm.  Incomplete RBBB.  Prior inferior infarct.   Lexiscan Cardiolite 08/21/15:  Nuclear stress EF: 57%.  The left ventricular ejection fraction is normal (55-65%).  There was no ST segment deviation noted during stress.  No T wave inversion was noted during stress.  Defect 1: There is a small defect of moderate severity present in the basal inferoseptal and basal inferior location.  Defect 2: There is a defect present in the basal anterior, mid anterior and apical anterior location.  Findings consistent with ischemia.  This is a high risk study.  High risk stress nuclear study with two areas of ischemia and normal left ventricular regional and global systolic function. The inferior area of ischemia is small. The anterior area of ischemia is  extensive, but appears to be very mild. The septum is spared.  LHC 08/26/15:  LM lesion, 90% stenosed. Involving both LAD and circumflex ostia  Prox RCA lesion, 90% stenosed. Mid RCA lesion, 90% stenosed. Dist RCA lesion, 100% stenosed.  SVG -(presumably to distal RCA) was not visualized . Likely flush occluded  Ost LAD lesion, 80% stenosed.  Ost Cx lesion, 90% stenosed. Prox Cx lesion, 80% stenosed.  Ost 1st Mrg to 1st Mrg lesion, 85% stenosed.  SVG-OM1 Prox Graft to Dist Graft lesion, 100% stenosed. Short stump noted at the ostium as well as at insertion site, this would suggest chronic occlusion  Mid Cx lesion, 95% stenosed. 3rd Mrg lesion, 100% stenosed.  Ost LAD to Mid LAD lesion, dilated, ectatic disease prior to 100% occlusion.  LIMA-LAD was injected is normal in caliber, widely patent to a wraparound LAD   Severe native vessel disease including left main stenosis with occluded RCA and LAD. Also subtotal occluded OM1 and total occluded OM 3. It is not clear what the grafts went to but both vein grafts are occluded.  LIMA-LAD is widely patent.   Carotid Doppler 12/11/15: 44-01% RICA, 0-27% LICA, >25% RECA stenosis  ABI 08/12/15: R 0.81, L 0.89 Diffuse atherosclerosis from the proximal abdominal aorta into both tibial arteries. Several areas of narrowing are noted in both lower extremities, although no focal significant stenosis is identified. One vessel run-off on the right, and three vessel run-off on the left.  ABI 02/03/17: R 0.81, L 0.91 Abnormal lower arterial Doppler study, with waveforms suggestive of inflow disease. The bilateral ABI's are stable and in the mild range. The right great toe pressure is normal. The left great toe pressure is abnormal   Recent Labs: 07/29/2016: BUN 12; Creatinine, Ser 1.29; Potassium 4.5; Sodium 140    Lipid Panel    Component Value Date/Time   CHOL 121 (L) 03/03/2016 1503   CHOL 129 07/10/2015 1418   TRIG 320 (H) 03/03/2016  1503   HDL 21 (L) 03/03/2016 1503   HDL 21 (L) 07/10/2015 1418   CHOLHDL 5.8 (H) 03/03/2016 1503   VLDL 64 (H) 03/03/2016 1503   LDLCALC 36 03/03/2016 1503  [place] 45 07/10/2015 1418      Wt Readings from Last 3 Encounters:  02/10/17 95.5 kg (210 lb 9.6 oz)  12/29/16 97.1 kg (214 lb)  11/04/16 98.9 kg (218 lb 1.6 oz)      ASSESSMENT AND PLAN:  # Chronic angina, uncontrolled: # CAD s/p CABG: Jason Velasquez has severe 3 vessel CAD and all his CABG grafts are occluded with the exception of his LIMA to the LAD.  His angina was better controlled after adding Ranexa, but lately it has been worse.  Imdur was ordered but he hasn't picked it up.  He was reminded of this prescription and will try it. He does not have insurance, but we will try to enroll him in cardiac rehab.  Continue aspirin, atorvastatin, metoprolol and ranolazine.   # Hypertension: BP is well-controlled. Continue lisinopril, metoprolol, and adding Imdur as above.   # Hyperlipidemia:  LDL 36 02/2016.  Fish oil added due to hypertriglyceridemia.  Will check fasting lipids.  # Claudication: ABIs 0.8 bilaterally and medical management was recommended in 2016.  Unchanged on repeat.  Cardiac rehab as above.  F/u scheduled with Dr. Gwenlyn Found.  Unclear if surgical revascularization may be an option.  # Carotid stenosis: Carotid Doppler revealed 50-49% R ICA stenosis and 1-39% stenosis on the left.  Continue aspirin and atorvastatin.   Current medicines are reviewed at length with the patient today.  The patient does not have concerns regarding medicines.  The following changes have been made:  Add Imdur 30 mg daily.  Labs/ tests ordered today include:   Orders Placed This Encounter  Procedures  . Lipid panel  . Comprehensive metabolic panel     Disposition:   FU with Jason Berninger C. Oval Linsey, MD in 3 months.   Signed, Skeet Latch, MD  02/10/2017 10:08 AM    [place]

## 2017-02-11 MED FILL — METOPROLOL SUCC ER 100 MG T: 100 | 30 days supply | Qty: 30 | Fill #6

## 2017-02-11 MED FILL — JANUVIA 100 MG TABLET: 100 | 30 days supply | Qty: 30 | Fill #6

## 2017-02-18 ENCOUNTER — Ambulatory Visit (INDEPENDENT_AMBULATORY_CARE_PROVIDER_SITE_OTHER): Payer: No Typology Code available for payment source | Admitting: Cardiovascular Disease

## 2017-02-18 ENCOUNTER — Encounter: Payer: Self-pay | Admitting: Cardiovascular Disease

## 2017-02-18 VITALS — BP 118/75 | HR 77 | Ht 71.0 in | Wt 213.0 lb

## 2017-02-18 DIAGNOSIS — I739 Peripheral vascular disease, unspecified: Secondary | ICD-10-CM

## 2017-02-18 NOTE — Patient Instructions (Addendum)
Medication Instructions: Your physician recommends that you continue on your current medications as directed. Please refer to the Current Medication list given to you today.   Testing/Procedures: Your physician has requested that you have a lower extremity arterial duplex. During this test, ultrasound is used to evaluate arterial blood flow in the legs. Allow one hour for this exam. There are no restrictions or special instructions.  Your physician has requested that you have an ankle brachial index (ABI). During this test an ultrasound and blood pressure cuff are used to evaluate the arteries that supply the arms and legs with blood. Allow thirty minutes for this exam. There are no restrictions or special instructions.  Your physician has requested that you have a aorta and iliac duplex. During this test, an ultrasound is used to evaluate blood flow to the aorta and iliac arteries. Allow one hour for this exam. Do not eat after midnight the day before and avoid carbonated beverages.   Follow-Up: Your physician recommends that you schedule a follow-up appointment as needed with Dr. Gwenlyn Found.

## 2017-02-18 NOTE — Assessment & Plan Note (Signed)
Jason Velasquez is referred back to me by Dr. Oval Linsey for evaluation of claudication. He has a long history of ischemic heart disease status post bypass grafting in the past. He had Dopplers performed 08/12/15 revealed a right ABI 0.8 on the left upper and 89. He had triple tibial disease on the left. He does have lifestyle limiting claudication. Recent Dopplers performed 02/03/17 revealed a right API 21 and left 0.91. He does complain of bilateral symmetric Lescol limiting claudication. I'm going to get aortoiliac and Dopplers and LEAs. If these show blockages amenable to endovascular therapy bring him back to discuss this.

## 2017-02-18 NOTE — Progress Notes (Signed)
02/18/2017 Jason Velasquez   1956/07/07  [id number]  Primary Physician [name], DO Primary Cardiologist: Lorretta Harp MD Renae Gloss  HPI:  Jason Velasquez is a very pleasant 61 year old African-American male with history of ischemic heart disease status post coronary artery bypass graft in 2007. I last saw him in the office 09/02/15. Because of chest pain and a positive Myoview stress test he underwent cardiac catheterization by Dr. Ellyn Hack revealing occluded graft to the PDA and circumflex complex marginal branches. Medical therapy is recommended. His other problems include hypertension, diabetes and hyperlipidemia. He does have bilateral lower extending claudication is fairly reproducible so lobectomy Dopplers performed recently showed ABIs in the 0.8 range with tibial disease on the left and no other focal lesions. Since I saw him back he has seen Dr. Oval Linsey complaining of chest pain and was begun on a long-acting oral nitrate. He also complains of bilateral lower extremity claudication with recent Dopplers revealing stable ABIs.   Current Outpatient Prescriptions  Medication Sig Dispense Refill  . aspirin EC 81 MG tablet Take 81 mg by mouth daily.    Marland Kitchen atorvastatin (LIPITOR) 40 MG tablet Take 1 tablet (40 mg total) by mouth daily. 30 tablet 11  . isosorbide mononitrate (IMDUR) 30 MG 24 hr tablet Take 1 tablet (30 mg total) by mouth daily. 30 tablet 5  . Lancets MISC 1 Device by Does not apply route 4 (four) times daily -  before meals and at bedtime. 100 each 11  . lisinopril (PRINIVIL,ZESTRIL) 10 MG tablet Take 1 tablet (10 mg total) by mouth daily. 90 tablet 3  . metFORMIN (GLUCOPHAGE) 1000 MG tablet Take 1 tablet (1,000 mg total) by mouth 2 (two) times daily with a meal. 60 tablet 11  . metoprolol succinate (TOPROL XL) 100 MG 24 hr tablet Take 1 tablet (100 mg total) by mouth daily. Take with or immediately following a meal. 30 tablet 11  . Omega-3 Fatty Acids (FISH  OIL) 1000 MG CAPS Take by mouth daily.    . ranolazine (RANEXA) 1000 MG SR tablet Take 1 tablet (1,000 mg total) by mouth 2 (two) times daily. 180 tablet 3  . sitaGLIPtin (JANUVIA) 100 MG tablet Take 1 tablet (100 mg total) by mouth daily. 30 tablet 11   No current facility-administered medications for this visit.     No Known Allergies  Social History   Social History  . Marital status: Divorced    Spouse name: N/A  . Number of children: N/A  . Years of education: 14   Occupational History  .  Unemployed   Social History Main Topics  . Smoking status: Former Smoker    Quit date: 10/04/2009  . Smokeless tobacco: Never Used  . Alcohol use No  . Drug use: No  . Sexual activity: Not on file   Other Topics Concern  . Not on file   Social History Narrative   Epworth Sleepiness Scale = 7 (as of 07/28/2015)     Review of Systems: General: negative for chills, fever, night sweats or weight changes.  Cardiovascular: negative for chest pain, dyspnea on exertion, edema, orthopnea, palpitations, paroxysmal nocturnal dyspnea or shortness of breath Dermatological: negative for rash Respiratory: negative for cough or wheezing Urologic: negative for hematuria Abdominal: negative for nausea, vomiting, diarrhea, bright red blood per rectum, melena, or hematemesis Neurologic: negative for visual changes, syncope, or dizziness All other systems reviewed and are otherwise negative except as noted above.  Blood pressure 118/75, pulse 77, height 5\' 11"  (1.803 m), weight 213 lb (96.6 kg), SpO2 99 %.  General appearance: alert and no distress Neck: no adenopathy, no carotid bruit, no JVD, supple, symmetrical, trachea midline and thyroid not enlarged, symmetric, no tenderness/mass/nodules Lungs: clear to auscultation bilaterally Heart: regular rate and rhythm, S1, S2 normal, no murmur, click, rub or gallop Extremities: extremities normal, atraumatic, no cyanosis or edema  EKG not  performed today  ASSESSMENT AND PLAN:   Claudication [facility] Jason Velasquez is referred back to me by Dr. Oval Linsey for evaluation of claudication. He has a long history of ischemic heart disease status post bypass grafting in the past. He had Dopplers performed 08/12/15 revealed a right ABI 0.8 on the left upper and 89. He had triple tibial disease on the left. He does have lifestyle limiting claudication. Recent Dopplers performed 02/03/17 revealed a right API 21 and left 0.91. He does complain of bilateral symmetric Lescol limiting claudication. I'm going to get aortoiliac and Dopplers and LEAs. If these show blockages amenable to endovascular therapy bring him back to discuss this.      Lorretta Harp MD FACP,FACC,FAHA, [facility] 02/18/2017 4:10 PM

## 2017-03-07 MED FILL — ATORVASTATIN 40 MG TABLET: 40 | 30 days supply | Qty: 30 | Fill #8

## 2017-03-14 MED FILL — JANUVIA 100 MG TABLET: 100 | 30 days supply | Qty: 30 | Fill #7

## 2017-03-14 MED FILL — METOPROLOL SUCC ER 100 MG T: 100 | 30 days supply | Qty: 30 | Fill #7

## 2017-03-18 ENCOUNTER — Telehealth: Payer: Self-pay | Admitting: Cardiovascular Disease

## 2017-03-18 NOTE — Telephone Encounter (Signed)
New Message ° ° pt verbalized that he is returning call for rn  °

## 2017-03-21 NOTE — Telephone Encounter (Signed)
Left message to call back  

## 2017-03-23 ENCOUNTER — Ambulatory Visit (HOSPITAL_COMMUNITY)
Admission: RE | Admit: 2017-03-23 | Discharge: 2017-03-23 | Disposition: A | Discharge status: Home / Self Care | Payer: No Typology Code available for payment source | Source: Ambulatory Visit | Attending: Cardiovascular Disease | Admitting: Cardiovascular Disease

## 2017-03-23 DIAGNOSIS — Z951 Presence of aortocoronary bypass graft: Secondary | ICD-10-CM | POA: Insufficient documentation

## 2017-03-23 DIAGNOSIS — Z8673 Personal history of transient ischemic attack (TIA), and cerebral infarction without residual deficits: Secondary | ICD-10-CM | POA: Insufficient documentation

## 2017-03-23 DIAGNOSIS — I739 Peripheral vascular disease, unspecified: Secondary | ICD-10-CM

## 2017-03-23 DIAGNOSIS — I10 Essential (primary) hypertension: Secondary | ICD-10-CM | POA: Insufficient documentation

## 2017-03-23 DIAGNOSIS — Z87891 Personal history of nicotine dependence: Secondary | ICD-10-CM | POA: Insufficient documentation

## 2017-03-23 DIAGNOSIS — I2510 Atherosclerotic heart disease of native coronary artery without angina pectoris: Secondary | ICD-10-CM | POA: Insufficient documentation

## 2017-03-23 DIAGNOSIS — I70203 Unspecified atherosclerosis of native arteries of extremities, bilateral legs: Secondary | ICD-10-CM | POA: Insufficient documentation

## 2017-03-23 DIAGNOSIS — E1151 Type 2 diabetes mellitus with diabetic peripheral angiopathy without gangrene: Secondary | ICD-10-CM | POA: Insufficient documentation

## 2017-03-31 MED FILL — LISINOPRIL 10 MG TABLET: 10 | 90 days supply | Qty: 90 | Fill #2

## 2017-04-07 MED FILL — ATORVASTATIN 40 MG TABLET: 40 | 30 days supply | Qty: 30 | Fill #9

## 2017-04-12 MED FILL — METOPROLOL SUCC ER 100 MG T: 100 | 30 days supply | Qty: 30 | Fill #8

## 2017-04-12 MED FILL — JANUVIA 100 MG TABLET: 100 | 30 days supply | Qty: 30 | Fill #8

## 2017-04-21 NOTE — Telephone Encounter (Signed)
Patient never called back. Spoke with nurse Jordan Likes since regarding test results

## 2017-04-22 ENCOUNTER — Ambulatory Visit: Payer: No Typology Code available for payment source

## 2017-05-09 MED FILL — ATORVASTATIN 40 MG TABLET: 40 | 30 days supply | Qty: 30 | Fill #10

## 2017-05-09 MED FILL — METOPROLOL SUCC ER 100 MG T: 100 | 30 days supply | Qty: 30 | Fill #9

## 2017-05-16 MED FILL — JANUVIA 100 MG TABLET: 100 | 30 days supply | Qty: 30 | Fill #9

## 2017-05-18 ENCOUNTER — Encounter: Payer: Self-pay | Admitting: Cardiovascular Disease

## 2017-05-18 ENCOUNTER — Ambulatory Visit (INDEPENDENT_AMBULATORY_CARE_PROVIDER_SITE_OTHER): Payer: No Typology Code available for payment source | Admitting: Cardiovascular Disease

## 2017-05-18 ENCOUNTER — Other Ambulatory Visit: Payer: Self-pay | Admitting: *Deleted

## 2017-05-18 VITALS — BP 130/70 | HR 87 | Ht 70.0 in | Wt 211.6 lb

## 2017-05-18 DIAGNOSIS — I119 Hypertensive heart disease without heart failure: Secondary | ICD-10-CM

## 2017-05-18 DIAGNOSIS — E7800 Pure hypercholesterolemia, unspecified: Secondary | ICD-10-CM

## 2017-05-18 DIAGNOSIS — I6523 Occlusion and stenosis of bilateral carotid arteries: Secondary | ICD-10-CM

## 2017-05-18 DIAGNOSIS — I200 Unstable angina: Secondary | ICD-10-CM

## 2017-05-18 DIAGNOSIS — I739 Peripheral vascular disease, unspecified: Secondary | ICD-10-CM

## 2017-05-18 DIAGNOSIS — I10 Essential (primary) hypertension: Secondary | ICD-10-CM

## 2017-05-18 MED ORDER — NITROGLYCERIN 0.4 MG SL SUBL: 0.4000 mg | SUBLINGUAL_TABLET | SUBLINGUAL | 5 refills | Status: DC | PRN

## 2017-05-18 MED ORDER — ISOSORBIDE MONONITRATE ER 60 MG PO TB24: 60.0000 mg | ORAL_TABLET | Freq: Every day | ORAL | 5 refills | Status: DC

## 2017-05-18 MED ORDER — METFORMIN HCL 1000 MG PO TABS: 1000.0000 mg | ORAL_TABLET | Freq: Two times a day (BID) | ORAL | 11 refills | Status: DC

## 2017-05-18 NOTE — Patient Instructions (Addendum)
Medication Instructions:  INCREASE YOUR ISOSORBIDE TO 60 MG DAILY  Use your NTG under your tongue for recurrent chest pain. May take one tablet every 5 minutes. If you are still having discomfort after 3 tablets in 15 minutes, call 911.  Labwork: BMET/CBC/PT/INR/PTT TODAY   Testing/Procedures: Your physician has requested that you have a cardiac catheterization. Cardiac catheterization is used to diagnose and/or treat various heart conditions. Doctors may recommend this procedure for a number of different reasons. The most common reason is to evaluate chest pain. Chest pain can be a symptom of coronary artery disease (CAD), and cardiac catheterization can show whether plaque is narrowing or blocking your heart's arteries. This procedure is also used to evaluate the valves, as well as measure the blood flow and oxygen levels in different parts of your heart. For further information please visit [email or web address]. Please follow instruction sheet, as given.  Your physician has requested that you have an echocardiogram. Echocardiography is a painless test that uses sound waves to create images of your heart. It provides your doctor with information about the size and shape of your heart and how well your heart's chambers and valves are working. This procedure takes approximately one hour. There are no restrictions for this procedure. [address]  Follow-Up: Your physician recommends that you schedule a follow-up appointment in: [place] [address] [place] [address] Dept: [phone] Loc: [place]  05/18/2017  You are scheduled for a Cardiac Catheterization on Monday, August 20 with Dr. [name].  1. Please arrive at the [facility] (Main Entrance A) at [facility]: [address] [place],  [place] [address] at 8:00 AM (two hours before your procedure to ensure your preparation). Free valet parking service is available.   Special note: Every effort is made to have your procedure done on time. Please understand that emergencies sometimes delay scheduled procedures.  2. Diet: Do not eat or drink anything after midnight prior to your procedure except sips of water to take medications.  3. Labs: TODAY  4. Medication instructions in preparation for your procedure:  Stop taking METFORMIN 24 HOURS PRIOR AND 48 HOURS AFTER PROCEDURE  DO NOT TAKE JANUVIA MORNING OF PROCEDURE  DO NOT TAKE LISINOPRIL MORNING OF PROCEDURE   On the morning of your procedure, take your Aspirin and any morning medicines NOT listed above.  You may use sips of water.  5. Plan for one night stay--bring personal belongings. 6. Bring a current list of your medications and current insurance cards. 7. You MUST have a responsible person to drive you home. 8. Someone MUST be with you the first 24 hours after you arrive home or your discharge will be delayed. 9. Please wear clothes that are easy to get on and off and wear slip-on shoes.  Thank you for allowing [place] to care for you!   -- [facility]

## 2017-05-18 NOTE — Progress Notes (Signed)
  Cardiology Office Note   Date:  05/18/2017   ID:  Jason Velasquez, DOB 08/13/1956, MRN [id number]  PCP:  Hoffman, Erik C, DO  Cardiologist:   Nayra Coury [place], MD   No chief complaint on file.     Patient ID: Jason Velasquez is a 61 y.o. male with CAD s/p CABG, chronic stable angina, PAD, hypertension, diabetes and CVA who presents for follow up.  Jason Velasquez was first evaluated for chest pain on 07/27/15.  He previously underwent CABG in 2007. He had a nuclear stress test on 08/21/15 revealed LVEF 57% with moderate ischemia in the inferoseptal, inferior, and anterior locations. He subsequently underwent left heart catheterization on 08/26/15 that revealed severe three vessel disease including a 90% left main lesion and an occluded RCA and LAD. His LIMA to the LAD was patent but all other vein grafts were occluded. There were no optimal PCI targets.  He was started on carvedilol and Ranexa. Jason Velasquez also underwent ABI testing 08/2015 that revealed diffuse atherosclerosis from the proximal abdominal aorta through the common and external iliac arteries bilaterally. There was no detectable flow in the L peroneal or anterior tibial arteries. ABIs were near 0.8 bilaterally. He was referred to Dr. Berry where medical management was advised. He was noted to have carotid bruits and was referred for carotid ultrasound on 12/09/15.  This revealed 40-59% stenosis on the right and 1-39% stenosis on the left.  He had repeat LE Dopplers and ABIs 03/2017 that were unchanged from prior.  Medical management was recommended.    At his last appointment Jason Velasquez continued to report angina despite Ranexa.  Imdur was added to his regimen.  He continues to report feeling chest pain last night he had an episode that occurred while sleeping. He was unable to go back to sleep. He did not take any sublingual nitroglycerin because it was almost time to take Imdur.  He reports chest pain with minimal exertion. He is unable to  walk for more than 10 minutes without getting chest pain. This is associated with shortness of breath and diaphoresis. He also occasionally has nausea. The episodes last for approximately 10 Periodminutes an subside with rest. He continues to report claudication in bilateral legs.   he denies lower extremity edema, orthopnea, or PND.    Past Medical History:  Diagnosis Date  . Angina, class III (HCC) 08/24/2015  . CAD (coronary artery disease) of artery bypass graft 06/25/2014   CABG- 2007   . CAD (coronary artery disease), native coronary artery 06/25/2014   CABG- 2007   . Carotid stenosis 03/03/2016   40-59% RICA, 1-39% LICA, >50% RECA stenosis Repeat 12/2016.  . Claudication (HCC)   . Controlled type 2 diabetes mellitus without complication (HCC) 12/07/2014   Dx March of 2016. GAD65 negative Anti Islet cell negative    . DKA (diabetic ketoacidoses) (HCC) 12/07/2014  . History of CVA (cerebrovascular accident) 07/12/2014  . Hx of adenomatous colonic polyps 05/27/2015  . Hypercholesterolemia   . Hypertension   . Hypertensive heart disease 03/03/2016  . S/P CABG (coronary artery bypass graft) 03/28/2015   2007 at [facility]   . Stroke (HCC)    2015  . Tubular adenoma of colon 05/31/2015   Colonoscopy 05/20/15 with Dr Gessner: 3 tubullar adenoma's largest 2.3cm.  Plan for repeat Colonoscopy in 2019.    Past Surgical History:  Procedure Laterality Date  . CARDIAC CATHETERIZATION N/A 08/26/2015   Procedure: Left Heart Cath and Cors/Grafts Angiography;    Surgeon: David W Harding, MD;  Location: [facility] INVASIVE CV LAB;  Service: Cardiovascular;  Laterality: N/A;  . CARDIAC SURGERY    . CORONARY ARTERY BYPASS GRAFT      Current Outpatient Prescriptions  Medication Sig Dispense Refill  . aspirin EC 81 MG tablet Take 81 mg by mouth daily.    . atorvastatin (LIPITOR) 40 MG tablet Take 1 tablet (40 mg total) by mouth daily. 30 tablet 11  . Lancets MISC 1 Device by Does not apply route 4  (four) times daily -  before meals and at bedtime. 100 each 11  . lisinopril (PRINIVIL,ZESTRIL) 10 MG tablet Take 1 tablet (10 mg total) by mouth daily. 90 tablet 3  . metFORMIN (GLUCOPHAGE) 1000 MG tablet Take 1 tablet (1,000 mg total) by mouth 2 (two) times daily with a meal. 60 tablet 11  . metoprolol succinate (TOPROL XL) 100 MG 24 hr tablet Take 1 tablet (100 mg total) by mouth daily. Take with or immediately following a meal. 30 tablet 11  . Omega-3 Fatty Acids (FISH OIL) 1000 MG CAPS Take by mouth daily.    . ranolazine (RANEXA) 1000 MG SR tablet Take 1 tablet (1,000 mg total) by mouth 2 (two) times daily. 180 tablet 3  . sitaGLIPtin (JANUVIA) 100 MG tablet Take 1 tablet (100 mg total) by mouth daily. 30 tablet 11  . isosorbide mononitrate (IMDUR) 60 MG 24 hr tablet Take 1 tablet (60 mg total) by mouth daily. 30 tablet 5  . nitroGLYCERIN (NITROSTAT) 0.4 MG SL tablet Place 1 tablet (0.4 mg total) under the tongue every 5 (five) minutes as needed for chest pain. 25 tablet 5   No current facility-administered medications for this visit.     Allergies:   Patient has no known allergies.    Social History:  The patient  reports that he quit smoking about 7 years ago. He has never used smokeless tobacco. He reports that he does not drink alcohol or use drugs.   Family History:  The patient's family history includes Diabetes in his father; Hypertension in his mother and sister.    ROS:  Please see the history of present illness.   Otherwise, review of systems are positive for none.   All other systems are reviewed and negative.    PHYSICAL EXAM: VS:  BP 130/70 (BP Location: Left Arm, Patient Position: Sitting)   Pulse 87   Ht 5' 10" (1.778 m)   Wt 96 kg (211 lb 9.6 oz)   BMI 30.36 kg/m  , BMI Body mass index is 30.36 kg/m. GENERAL:  Well appearing.  No acute distress HEENT:  Pupils equal round and reactive, fundi not visualized, oral mucosa unremarkable NECK:  No jugular venous  distention, waveform within normal limits, carotid upstroke brisk and symmetric, no carotid bruit LUNGS:  Clear to auscultation bilaterally.   HEART:  RRR.  PMI not displaced or sustained,S1 and S2 within normal limits, no S3, no S4, no clicks, no rubs, no murmurs ABD:  Flat, positive bowel sounds normal in frequency in pitch, no bruits, no rebound, no guarding, no midline pulsatile mass, no hepatomegaly, no splenomegaly EXT:  1+ R PT.  Absent DP.  Absent L PT or DP.  No edema, no cyanosis no clubbing.  2+ radial pulses.  SKIN:  No rashes no nodules.  Lack of hair in lower tibia NEURO:  Cranial nerves II through XII grossly intact, motor grossly intact throughout PSYCH:  Cognitively intact, oriented to person place and   time   EKG:  EKG is ordered today. The ekg ordered today demonstrates sinus rhythm rate 82 bpm.  Prior inferior infarct.  07/02/16: Sinus rhythm rate 75 bpm.  Prior inferior infarct  12/29/16: Sinus rhythm.  Incomplete RBBB.  Prior inferior infarct.  05/18/17: Sinus rhythm. Rate 87 bpm. B 1 PVCs.  Lexiscan Cardiolite 08/21/15:  Nuclear stress EF: 57%.  The left ventricular ejection fraction is normal (55-65%).  There was no ST segment deviation noted during stress.  No T wave inversion was noted during stress.  Defect 1: There is a small defect of moderate severity present in the basal inferoseptal and basal inferior location.  Defect 2: There is a defect present in the basal anterior, mid anterior and apical anterior location.  Findings consistent with ischemia.  This is a high risk study.  High risk stress nuclear study with two areas of ischemia and normal left ventricular regional and global systolic function. The inferior area of ischemia is small. The anterior area of ischemia is extensive, but appears to be very mild. The septum is spared.  LHC 08/26/15:  LM lesion, 90% stenosed. Involving both LAD and circumflex ostia  Prox RCA lesion, 90% stenosed. Mid  RCA lesion, 90% stenosed. Dist RCA lesion, 100% stenosed.  SVG -(presumably to distal RCA) was not visualized . Likely flush occluded  Ost LAD lesion, 80% stenosed.  Ost Cx lesion, 90% stenosed. Prox Cx lesion, 80% stenosed.  Ost 1st Mrg to 1st Mrg lesion, 85% stenosed.  SVG-OM1 Prox Graft to Dist Graft lesion, 100% stenosed. Short stump noted at the ostium as well as at insertion site, this would suggest chronic occlusion  Mid Cx lesion, 95% stenosed. 3rd Mrg lesion, 100% stenosed.  Ost LAD to Mid LAD lesion, dilated, ectatic disease prior to 100% occlusion.  LIMA-LAD was injected is normal in caliber, widely patent to a wraparound LAD   Severe native vessel disease including left main stenosis with occluded RCA and LAD. Also subtotal occluded OM1 and total occluded OM 3. It is not clear what the grafts went to but both vein grafts are occluded.  LIMA-LAD is widely patent.   Carotid Doppler 12/11/15: 40-59% RICA, 1-39% LICA, >50% RECA stenosis  ABI 08/12/15: R 0.81, L 0.89 Diffuse atherosclerosis from the proximal abdominal aorta into both tibial arteries. Several areas of narrowing are noted in both lower extremities, although no focal significant stenosis is identified. One vessel run-off on the right, and three vessel run-off on the left.  ABI 02/03/17: R 0.81, L 0.91 Abnormal lower arterial Doppler study, with waveforms suggestive of inflow disease. The bilateral ABI's are stable and in the mild range. The right great toe pressure is normal. The left great toe pressure is abnormal   Recent Labs: 07/29/2016: BUN 12; Creatinine, Ser 1.29; Potassium 4.5; Sodium 140    Lipid Panel    Component Value Date/Time   CHOL 121 (L) 03/03/2016 1503   CHOL 129 07/10/2015 1418   TRIG 320 (H) 03/03/2016 1503   HDL 21 (L) 03/03/2016 1503   HDL 21 (L) 07/10/2015 1418   CHOLHDL 5.8 (H) 03/03/2016 1503   VLDL 64 (H) 03/03/2016 1503   LDLCALC 36 03/03/2016 1503   LDLCALC 45  07/10/2015 1418      Wt Readings from Last 3 Encounters:  05/18/17 96 kg (211 lb 9.6 oz)  02/18/17 96.6 kg (213 lb)  02/10/17 95.5 kg (210 lb 9.6 oz)      ASSESSMENT AND PLAN:  # Unstable angina: #   CAD s/p CABG:  Jason Velasquez has severe 3 vessel CAD and all his CABG grafts are occluded with the exception of his LIMA to the LAD.  He has chronic angina that is increasing in severity and occurring after less exertion.  His angina was better controlled after adding Ranexa, but lately it has been worse.  Imdur has not helped.  We will increase Imdur to 60mg daily.  Plan for repeat LHC.  He may need extensive stenting or re-do CABG.  Continue aspirin, atorvastatin, metoprolol and ranolazine.  Unable to afford cardiac rehab. Check an echo.  # Hypertension: BP is well-controlled. Continue lisinopril, metoprolol, and Imdur as above.   # Hyperlipidemia:  LDL 36 02/2016.  Continue atorvastatin and fish oil.   # Claudication: ABIs 0.8 bilaterally and medical management was recommended in 2016.  Unchanged on repeat.  Encouraged ambulation. Repeat ABIs in 1 year.  Continue aspirin and statin.   # Carotid stenosis: Carotid Doppler revealed 50-49% R ICA stenosis and 1-39% stenosis on the left.  Continue aspirin and atorvastatin.   Current medicines are reviewed at length with the patient today.  The patient does not have concerns regarding medicines.  The following changes have been made:  Increase Imdur to 60mg daily.  Labs/ tests ordered today include:   Orders Placed This Encounter  Procedures  . CBC with Differential/Platelet  . Basic metabolic panel  . PTT  . INR/PT  . ECHOCARDIOGRAM COMPLETE     Disposition:   FU with Mirenda Baltazar Velasquez. [place], MD in 2 months.   Signed, Peniel Hass Rose, MD  05/18/2017 4:11 PM    [facility] 

## 2017-05-18 NOTE — Addendum Note (Signed)
Addended by: Alvina Filbert B on: 05/18/2017 05:54 PM   Modules accepted: Orders

## 2017-05-19 LAB — CBC WITH DIFFERENTIAL/PLATELET
BASOS ABS: 0 10*3/uL (ref 0.0–0.2)
Basos: 0 %
EOS (ABSOLUTE): 0 10*3/uL (ref 0.0–0.4)
Eos: 1 %
HEMOGLOBIN: 10 g/dL — AB (ref 13.0–17.7)
Hematocrit: 33.7 % — ABNORMAL LOW (ref 37.5–51.0)
IMMATURE GRANS (ABS): 0 10*3/uL (ref 0.0–0.1)
Immature Granulocytes: 0 %
LYMPHS: 38 %
Lymphocytes Absolute: 1.9 10*3/uL (ref 0.7–3.1)
MCH: 22.6 pg — ABNORMAL LOW (ref 26.6–33.0)
MCHC: 29.7 g/dL — ABNORMAL LOW (ref 31.5–35.7)
MCV: 76 fL — ABNORMAL LOW (ref 79–97)
MONOCYTES: 8 %
Monocytes Absolute: 0.4 10*3/uL (ref 0.1–0.9)
NEUTROS ABS: 2.6 10*3/uL (ref 1.4–7.0)
Neutrophils: 53 %
Platelets: 256 10*3/uL (ref 150–379)
RBC: 4.43 x10E6/uL (ref 4.14–5.80)
RDW: 19.9 % — ABNORMAL HIGH (ref 12.3–15.4)
WBC: 5 10*3/uL (ref 3.4–10.8)

## 2017-05-19 LAB — BASIC METABOLIC PANEL
BUN/Creatinine Ratio: 15 (ref 10–24)
BUN: 16 mg/dL (ref 8–27)
CO2: 22 mmol/L (ref 20–29)
CREATININE: 1.09 mg/dL (ref 0.76–1.27)
Calcium: 9.6 mg/dL (ref 8.6–10.2)
Chloride: 106 mmol/L (ref 96–106)
GFR calc Af Amer: 84 mL/min/{1.73_m2} (ref >59)
GFR calc non Af Amer: 72 mL/min/{1.73_m2} (ref >59)
GLUCOSE: 138 mg/dL — AB (ref 65–99)
Potassium: 5.1 mmol/L (ref 3.5–5.2)
Sodium: 141 mmol/L (ref 134–144)

## 2017-05-19 LAB — PROTIME-INR
INR: 1 (ref 0.8–1.2)
Prothrombin Time: 10.5 s (ref 9.1–12.0)

## 2017-05-19 LAB — APTT: aPTT: 24 s (ref 24–33)

## 2017-05-20 ENCOUNTER — Telehealth: Payer: Self-pay

## 2017-05-20 NOTE — Telephone Encounter (Signed)
Patient contacted pre-catheterization at [facility] scheduled for: 05/23/2017 @ 1030 Verified arrival time and place:  [facility] @ 0800-gave direction to admissions Confirmed AM meds to be taken pre-cath with sip of water: Take ASA Hold day of procedure:  Januvia, lisinopril Hold 24 hours prior to procedure:  Metformin Confirmed patient has responsible person to drive home post procedure and observe patient for 24 hours: yes

## 2017-05-20 NOTE — Telephone Encounter (Signed)
Call made to Pt.  Unable to leave message d/t  "network difficulties...please try your call again later".   Will attempt to call later.

## 2017-05-23 ENCOUNTER — Encounter (HOSPITAL_COMMUNITY)
Admission: RE | Disposition: A | Discharge status: Home / Self Care | Payer: Self-pay | Source: Ambulatory Visit | Attending: Cardiology

## 2017-05-23 ENCOUNTER — Ambulatory Visit (HOSPITAL_COMMUNITY)
Admission: RE | Admit: 2017-05-23 | Discharge: 2017-05-23 | Disposition: A | Discharge status: Home / Self Care | Payer: No Typology Code available for payment source | Source: Ambulatory Visit | Attending: Cardiology | Admitting: Cardiology

## 2017-05-23 DIAGNOSIS — Z87891 Personal history of nicotine dependence: Secondary | ICD-10-CM | POA: Insufficient documentation

## 2017-05-23 DIAGNOSIS — I25118 Atherosclerotic heart disease of native coronary artery with other forms of angina pectoris: Secondary | ICD-10-CM | POA: Insufficient documentation

## 2017-05-23 DIAGNOSIS — I200 Unstable angina: Secondary | ICD-10-CM | POA: Diagnosis present

## 2017-05-23 DIAGNOSIS — Z7984 Long term (current) use of oral hypoglycemic drugs: Secondary | ICD-10-CM | POA: Insufficient documentation

## 2017-05-23 DIAGNOSIS — I119 Hypertensive heart disease without heart failure: Secondary | ICD-10-CM | POA: Insufficient documentation

## 2017-05-23 DIAGNOSIS — E7800 Pure hypercholesterolemia, unspecified: Secondary | ICD-10-CM | POA: Insufficient documentation

## 2017-05-23 DIAGNOSIS — I10 Essential (primary) hypertension: Secondary | ICD-10-CM | POA: Insufficient documentation

## 2017-05-23 DIAGNOSIS — E1151 Type 2 diabetes mellitus with diabetic peripheral angiopathy without gangrene: Secondary | ICD-10-CM | POA: Insufficient documentation

## 2017-05-23 DIAGNOSIS — I25718 Atherosclerosis of autologous vein coronary artery bypass graft(s) with other forms of angina pectoris: Secondary | ICD-10-CM | POA: Insufficient documentation

## 2017-05-23 DIAGNOSIS — Z8673 Personal history of transient ischemic attack (TIA), and cerebral infarction without residual deficits: Secondary | ICD-10-CM | POA: Insufficient documentation

## 2017-05-23 DIAGNOSIS — I209 Angina pectoris, unspecified: Secondary | ICD-10-CM | POA: Diagnosis present

## 2017-05-23 DIAGNOSIS — I2582 Chronic total occlusion of coronary artery: Secondary | ICD-10-CM | POA: Insufficient documentation

## 2017-05-23 DIAGNOSIS — Z7982 Long term (current) use of aspirin: Secondary | ICD-10-CM | POA: Insufficient documentation

## 2017-05-23 DIAGNOSIS — Z951 Presence of aortocoronary bypass graft: Secondary | ICD-10-CM | POA: Insufficient documentation

## 2017-05-23 DIAGNOSIS — I25110 Atherosclerotic heart disease of native coronary artery with unstable angina pectoris: Secondary | ICD-10-CM

## 2017-05-23 HISTORY — PX: LEFT HEART CATH AND CORS/GRAFTS ANGIOGRAPHY: CATH118250

## 2017-05-23 LAB — GLUCOSE, CAPILLARY
GLUCOSE-CAPILLARY: 124 mg/dL — AB (ref 65–99)
Glucose-Capillary: 121 mg/dL — ABNORMAL HIGH (ref 65–99)

## 2017-05-23 SURGERY — LEFT HEART CATH AND CORS/GRAFTS ANGIOGRAPHY
Anesthesia: LOCAL

## 2017-05-23 MED ORDER — ASPIRIN 81 MG PO CHEW
81.0000 mg | CHEWABLE_TABLET | ORAL | Status: AC
  Administered 2017-05-23: 81 mg via ORAL

## 2017-05-23 MED ORDER — SODIUM CHLORIDE 0.9 % WEIGHT BASED INFUSION: 1.0000 mL/kg/h | INTRAVENOUS | Status: DC

## 2017-05-23 MED ORDER — MIDAZOLAM HCL 2 MG/2ML IJ SOLN
INTRAMUSCULAR | Status: AC
  Filled 2017-05-23: qty 2

## 2017-05-23 MED ORDER — ISOSORBIDE MONONITRATE ER 60 MG PO TB24
60.0000 mg | ORAL_TABLET | Freq: Two times a day (BID) | ORAL | 5 refills | Status: DC
Start: 2017-05-23 — End: 2017-05-26

## 2017-05-23 MED ORDER — LIDOCAINE HCL (PF) 1 % IJ SOLN
INTRAMUSCULAR | Status: DC | PRN
  Administered 2017-05-23: 30 mL

## 2017-05-23 MED ORDER — ASPIRIN 81 MG PO CHEW
CHEWABLE_TABLET | ORAL | Status: AC
  Filled 2017-05-23: qty 1

## 2017-05-23 MED ORDER — SODIUM CHLORIDE 0.9% FLUSH: 3.0000 mL | INTRAVENOUS | Status: DC | PRN

## 2017-05-23 MED ORDER — FENTANYL CITRATE (PF) 100 MCG/2ML IJ SOLN
INTRAMUSCULAR | Status: DC | PRN
  Administered 2017-05-23: 50 ug via INTRAVENOUS

## 2017-05-23 MED ORDER — FENTANYL CITRATE (PF) 100 MCG/2ML IJ SOLN
INTRAMUSCULAR | Status: AC
  Filled 2017-05-23: qty 2

## 2017-05-23 MED ORDER — MIDAZOLAM HCL 2 MG/2ML IJ SOLN
INTRAMUSCULAR | Status: DC | PRN
  Administered 2017-05-23: 2 mg via INTRAVENOUS

## 2017-05-23 MED ORDER — IOPAMIDOL (ISOVUE-370) INJECTION 76%
INTRAVENOUS | Status: AC
  Filled 2017-05-23: qty 125

## 2017-05-23 MED ORDER — LIDOCAINE HCL (PF) 1 % IJ SOLN
INTRAMUSCULAR | Status: AC
  Filled 2017-05-23: qty 30

## 2017-05-23 MED ORDER — NITROGLYCERIN 0.4 MG SL SUBL: 0.4000 mg | SUBLINGUAL_TABLET | SUBLINGUAL | Status: DC | PRN

## 2017-05-23 MED ORDER — IOPAMIDOL (ISOVUE-370) INJECTION 76%
INTRAVENOUS | Status: DC | PRN
  Administered 2017-05-23: 90 mL via INTRA_ARTERIAL

## 2017-05-23 MED ORDER — ONDANSETRON HCL 4 MG/2ML IJ SOLN: 4.0000 mg | Freq: Four times a day (QID) | INTRAMUSCULAR | Status: DC | PRN

## 2017-05-23 MED ORDER — SODIUM CHLORIDE 0.9 % IV SOLN: INTRAVENOUS | Status: AC

## 2017-05-23 MED ORDER — SODIUM CHLORIDE 0.9 % IV SOLN: 250.0000 mL | INTRAVENOUS | Status: DC | PRN

## 2017-05-23 MED ORDER — SODIUM CHLORIDE 0.9 % WEIGHT BASED INFUSION
3.0000 mL/kg/h | INTRAVENOUS | Status: AC
  Administered 2017-05-23: 3 mL/kg/h via INTRAVENOUS

## 2017-05-23 MED ORDER — HEPARIN (PORCINE) IN NACL 2-0.9 UNIT/ML-% IJ SOLN
INTRAMUSCULAR | Status: AC
  Filled 2017-05-23: qty 1000

## 2017-05-23 MED ORDER — SODIUM CHLORIDE 0.9% FLUSH: 3.0000 mL | Freq: Two times a day (BID) | INTRAVENOUS | Status: DC

## 2017-05-23 MED ORDER — ACETAMINOPHEN 325 MG PO TABS: 650.0000 mg | ORAL_TABLET | ORAL | Status: DC | PRN

## 2017-05-23 MED ORDER — HEPARIN (PORCINE) IN NACL 2-0.9 UNIT/ML-% IJ SOLN
INTRAMUSCULAR | Status: AC | PRN
  Administered 2017-05-23: 1000 mL

## 2017-05-23 SURGICAL SUPPLY — 7 items
CATH EXPO 5FR FR4 (CATHETERS) ×1 IMPLANT
CATH INFINITI 5FR MULTPACK ANG (CATHETERS) ×1 IMPLANT
KIT HEART LEFT (KITS) ×2 IMPLANT
PACK CARDIAC CATHETERIZATION (CUSTOM PROCEDURE TRAY) ×2 IMPLANT
SHEATH PINNACLE 5F 10CM (SHEATH) ×1 IMPLANT
TRANSDUCER W/STOPCOCK (MISCELLANEOUS) ×2 IMPLANT
WIRE EMERALD 3MM-J .035X150CM (WIRE) ×1 IMPLANT

## 2017-05-23 NOTE — Interval H&P Note (Signed)
History and Physical Interval Note:  05/23/2017 10:22 AM  Jason Velasquez  has presented today for surgery, with the diagnosis of progressive angina (class 3-4)  The various methods of treatment have been discussed with the patient and family. After consideration of risks, benefits and other options for treatment, the patient has consented to  Procedure(s): LEFT HEART CATH AND CORS/GRAFTS ANGIOGRAPHY (N/A) with possible Percutaneous Coronary Intervention as a surgical intervention .  The patient's history has been reviewed, patient examined, no change in status, stable for surgery.  I have reviewed the patient's chart and labs.  Questions were answered to the patient's satisfaction.    Cath Lab Visit (complete for each Cath Lab visit)  Clinical Evaluation Leading to the Procedure:   ACS: No.  Non-ACS:    Anginal Classification: CCS IV  Anti-ischemic medical therapy: Maximal Therapy (2 or more classes of medications)  Non-Invasive Test Results: No non-invasive testing performed  Prior CABG: Previous CABG   [name]

## 2017-05-23 NOTE — Discharge Instructions (Signed)
NO METFORMIN/GLUCOPHAGE FOR 2 DAYS ° ° °Femoral Site Care °Refer to this sheet in the next few weeks. These instructions provide you with information about caring for yourself after your procedure. Your health care provider may also give you more specific instructions. Your treatment has been planned according to current medical practices, but problems sometimes occur. Call your health care provider if you have any problems or questions after your procedure. °What can I expect after the procedure? °After your procedure, it is typical to have the following: °· Bruising at the site that usually fades within 1-2 weeks. °· Blood collecting in the tissue (hematoma) that may be painful to the touch. It should usually decrease in size and tenderness within 1-2 weeks. ° °Follow these instructions at home: °· Take medicines only as directed by your health care provider. °· You may shower 24-48 hours after the procedure or as directed by your health care provider. Remove the bandage (dressing) and gently wash the site with plain soap and water. Pat the area dry with a clean towel. Do not rub the site, because this may cause bleeding. °· Do not take baths, swim, or use a hot tub until your health care provider approves. °· Check your insertion site every day for redness, swelling, or drainage. °· Do not apply powder or lotion to the site. °· Limit use of stairs to twice a day for the first 2-3 days or as directed by your health care provider. °· Do not squat for the first 2-3 days or as directed by your health care provider. °· Do not lift over 10 lb (4.5 kg) for 5 days after your procedure or as directed by your health care provider. °· Ask your health care provider when it is okay to: °? Return to work or school. °? Resume usual physical activities or sports. °? Resume sexual activity. °· Do not drive home if you are discharged the same day as the procedure. Have someone else drive you. °· You may drive 24 hours after the  procedure unless otherwise instructed by your health care provider. °· Do not operate machinery or power tools for 24 hours after the procedure or as directed by your health care provider. °· If your procedure was done as an outpatient procedure, which means that you went home the same day as your procedure, a responsible adult should be with you for the first 24 hours after you arrive home. °· Keep all follow-up visits as directed by your health care provider. This is important. °Contact a health care provider if: °· You have a fever. °· You have chills. °· You have increased bleeding from the site. Hold pressure on the site. °Get help right away if: °· You have unusual pain at the site. °· You have redness, warmth, or swelling at the site. °· You have drainage (other than a small amount of blood on the dressing) from the site. °· The site is bleeding, and the bleeding does not stop after 30 minutes of holding steady pressure on the site. °· Your leg or foot becomes pale, cool, tingly, or numb. °This information is not intended to replace advice given to you by your health care provider. Make sure you discuss any questions you have with your health care provider. °Document Released: 05/24/2014 Document Revised: 02/26/2016 Document Reviewed: 04/09/2014 °Elsevier Interactive Patient Education © 2018 [employer] ° °

## 2017-05-23 NOTE — Progress Notes (Addendum)
Site area: Right groin a 5 french arterial sheath was removed  Site Prior to Removal:  Level 0  Pressure Applied For 15 MINUTES    Bedrest Beginning at 1150p  Manual:   Yes.    Patient Status During Pull:  stable  Post Pull Groin Site:  Level 0  Post Pull Instructions Given:  Yes.    Post Pull Pulses Present:  Yes.    Dressing Applied:  Yes.    Comments:  VS remain stable during sheath pull.

## 2017-05-23 NOTE — H&P (View-Only) (Signed)
  Cardiology Office Note   Date:  05/18/2017   ID:  Machi Wolter, DOB 07/25/1956, MRN [id number]  PCP:  Hoffman, Erik C, DO  Cardiologist:   Noura Purpura Ellis, MD   No chief complaint on file.     Patient ID: Keniel Say is a 61 y.o. male with CAD s/p CABG, chronic stable angina, PAD, hypertension, diabetes and CVA who presents for follow up.  Mr. Kunz was first evaluated for chest pain on 07/27/15.  He previously underwent CABG in 2007. He had a nuclear stress test on 08/21/15 revealed LVEF 57% with moderate ischemia in the inferoseptal, inferior, and anterior locations. He subsequently underwent left heart catheterization on 08/26/15 that revealed severe three vessel disease including a 90% left main lesion and an occluded RCA and LAD. His LIMA to the LAD was patent but all other vein grafts were occluded. There were no optimal PCI targets.  He was started on carvedilol and Ranexa. Mr. Gertner also underwent ABI testing 08/2015 that revealed diffuse atherosclerosis from the proximal abdominal aorta through the common and external iliac arteries bilaterally. There was no detectable flow in the L peroneal or anterior tibial arteries. ABIs were near 0.8 bilaterally. He was referred to Dr. Berry where medical management was advised. He was noted to have carotid bruits and was referred for carotid ultrasound on 12/09/15.  This revealed 40-59% stenosis on the right and 1-39% stenosis on the left.  He had repeat LE Dopplers and ABIs 03/2017 that were unchanged from prior.  Medical management was recommended.    At his last appointment Mr. Rod continued to report angina despite Ranexa.  Imdur was added to his regimen.  He continues to report feeling chest pain last night he had an episode that occurred while sleeping. He was unable to go back to sleep. He did not take any sublingual nitroglycerin because it was almost time to take Imdur.  He reports chest pain with minimal exertion. He is unable to  walk for more than 10 minutes without getting chest pain. This is associated with shortness of breath and diaphoresis. He also occasionally has nausea. The episodes last for approximately 10 Periodminutes an subside with rest. He continues to report claudication in bilateral legs.   he denies lower extremity edema, orthopnea, or PND.    Past Medical History:  Diagnosis Date  . Angina, class III (HCC) 08/24/2015  . CAD (coronary artery disease) of artery bypass graft 06/25/2014   CABG- 2007   . CAD (coronary artery disease), native coronary artery 06/25/2014   CABG- 2007   . Carotid stenosis 03/03/2016   40-59% RICA, 1-39% LICA, >50% RECA stenosis Repeat 12/2016.  . Claudication (HCC)   . Controlled type 2 diabetes mellitus without complication (HCC) 12/07/2014   Dx March of 2016. GAD65 negative Anti Islet cell negative    . DKA (diabetic ketoacidoses) (HCC) 12/07/2014  . History of CVA (cerebrovascular accident) 07/12/2014  . Hx of adenomatous colonic polyps 05/27/2015  . Hypercholesterolemia   . Hypertension   . Hypertensive heart disease 03/03/2016  . S/P CABG (coronary artery bypass graft) 03/28/2015   2007 at [facility]   . Stroke (HCC)    2015  . Tubular adenoma of colon 05/31/2015   Colonoscopy 05/20/15 with Dr Gessner: 3 tubullar adenoma's largest 2.3cm.  Plan for repeat Colonoscopy in 2019.    Past Surgical History:  Procedure Laterality Date  . CARDIAC CATHETERIZATION N/A 08/26/2015   Procedure: Left Heart Cath and Cors/Grafts Angiography;    Surgeon: David W Harding, MD;  Location: [facility] INVASIVE CV [facility];  Service: Cardiovascular;  Laterality: N/A;  . CARDIAC SURGERY    . CORONARY ARTERY BYPASS GRAFT      Current Outpatient Prescriptions  Medication Sig Dispense Refill  . aspirin EC 81 MG tablet Take 81 mg by mouth daily.    . atorvastatin (LIPITOR) 40 MG tablet Take 1 tablet (40 mg total) by mouth daily. 30 tablet 11  . Lancets MISC 1 Device by Does not apply route 4  (four) times daily -  before meals and at bedtime. 100 each 11  . lisinopril (PRINIVIL,ZESTRIL) 10 MG tablet Take 1 tablet (10 mg total) by mouth daily. 90 tablet 3  . metFORMIN (GLUCOPHAGE) 1000 MG tablet Take 1 tablet (1,000 mg total) by mouth 2 (two) times daily with a meal. 60 tablet 11  . metoprolol succinate (TOPROL XL) 100 MG 24 hr tablet Take 1 tablet (100 mg total) by mouth daily. Take with or immediately following a meal. 30 tablet 11  . Omega-3 Fatty Acids (FISH OIL) 1000 MG CAPS Take by mouth daily.    . ranolazine (RANEXA) 1000 MG SR tablet Take 1 tablet (1,000 mg total) by mouth 2 (two) times daily. 180 tablet 3  . sitaGLIPtin (JANUVIA) 100 MG tablet Take 1 tablet (100 mg total) by mouth daily. 30 tablet 11  . isosorbide mononitrate (IMDUR) 60 MG 24 hr tablet Take 1 tablet (60 mg total) by mouth daily. 30 tablet 5  . nitroGLYCERIN (NITROSTAT) 0.4 MG SL tablet Place 1 tablet (0.4 mg total) under the tongue every 5 (five) minutes as needed for chest pain. 25 tablet 5   No current facility-administered medications for this visit.     Allergies:   Patient has no known allergies.    Social History:  The patient  reports that he quit smoking about 7 years ago. He has never used smokeless tobacco. He reports that he does not drink alcohol or use drugs.   Family History:  The patient's family history includes Diabetes in his father; Hypertension in his mother and sister.    ROS:  Please see the history of present illness.   Otherwise, review of systems are positive for none.   All other systems are reviewed and negative.    PHYSICAL EXAM: VS:  BP 130/70 (BP Location: Left Arm, Patient Position: Sitting)   Pulse 87   Ht 5' 10" (1.778 m)   Wt 96 kg (211 lb 9.6 oz)   BMI 30.36 kg/m  , BMI Body mass index is 30.36 kg/m. GENERAL:  Well appearing.  No acute distress HEENT:  Pupils equal round and reactive, fundi not visualized, oral mucosa unremarkable NECK:  No jugular venous  distention, waveform within normal limits, carotid upstroke brisk and symmetric, no carotid bruit LUNGS:  Clear to auscultation bilaterally.   HEART:  RRR.  PMI not displaced or sustained,S1 and S2 within normal limits, no S3, no S4, no clicks, no rubs, no murmurs ABD:  Flat, positive bowel sounds normal in frequency in pitch, no bruits, no rebound, no guarding, no midline pulsatile mass, no hepatomegaly, no splenomegaly EXT:  1+ R PT.  Absent DP.  Absent L PT or DP.  No edema, no cyanosis no clubbing.  2+ radial pulses.  SKIN:  No rashes no nodules.  Lack of hair in lower tibia NEURO:  Cranial nerves II through XII grossly intact, motor grossly intact throughout PSYCH:  Cognitively intact, oriented to person place and   time   EKG:  EKG is ordered today. The ekg ordered today demonstrates sinus rhythm rate 82 bpm.  Prior inferior infarct.  07/02/16: Sinus rhythm rate 75 bpm.  Prior inferior infarct  12/29/16: Sinus rhythm.  Incomplete RBBB.  Prior inferior infarct.  05/18/17: Sinus rhythm. Rate 87 bpm. B 1 PVCs.  Lexiscan Cardiolite 08/21/15:  Nuclear stress EF: 57%.  The left ventricular ejection fraction is normal (55-65%).  There was no ST segment deviation noted during stress.  No T wave inversion was noted during stress.  Defect 1: There is a small defect of moderate severity present in the basal inferoseptal and basal inferior location.  Defect 2: There is a defect present in the basal anterior, mid anterior and apical anterior location.  Findings consistent with ischemia.  This is a high risk study.  High risk stress nuclear study with two areas of ischemia and normal left ventricular regional and global systolic function. The inferior area of ischemia is small. The anterior area of ischemia is extensive, but appears to be very mild. The septum is spared.  LHC 08/26/15:  LM lesion, 90% stenosed. Involving both LAD and circumflex ostia  Prox RCA lesion, 90% stenosed. Mid  RCA lesion, 90% stenosed. Dist RCA lesion, 100% stenosed.  SVG -(presumably to distal RCA) was not visualized . Likely flush occluded  Ost LAD lesion, 80% stenosed.  Ost Cx lesion, 90% stenosed. Prox Cx lesion, 80% stenosed.  Ost 1st Mrg to 1st Mrg lesion, 85% stenosed.  SVG-OM1 Prox Graft to Dist Graft lesion, 100% stenosed. Short stump noted at the ostium as well as at insertion site, this would suggest chronic occlusion  Mid Cx lesion, 95% stenosed. 3rd Mrg lesion, 100% stenosed.  Ost LAD to Mid LAD lesion, dilated, ectatic disease prior to 100% occlusion.  LIMA-LAD was injected is normal in caliber, widely patent to a wraparound LAD   Severe native vessel disease including left main stenosis with occluded RCA and LAD. Also subtotal occluded OM1 and total occluded OM 3. It is not clear what the grafts went to but both vein grafts are occluded.  LIMA-LAD is widely patent.   Carotid Doppler 12/11/15: 40-59% RICA, 1-39% LICA, >50% RECA stenosis  ABI 08/12/15: R 0.81, L 0.89 Diffuse atherosclerosis from the proximal abdominal aorta into both tibial arteries. Several areas of narrowing are noted in both lower extremities, although no focal significant stenosis is identified. One vessel run-off on the right, and three vessel run-off on the left.  ABI 02/03/17: R 0.81, L 0.91 Abnormal lower arterial Doppler study, with waveforms suggestive of inflow disease. The bilateral ABI's are stable and in the mild range. The right great toe pressure is normal. The left great toe pressure is abnormal   Recent Labs: 07/29/2016: BUN 12; Creatinine, Ser 1.29; Potassium 4.5; Sodium 140    Lipid Panel    Component Value Date/Time   CHOL 121 (L) 03/03/2016 1503   CHOL 129 07/10/2015 1418   TRIG 320 (H) 03/03/2016 1503   HDL 21 (L) 03/03/2016 1503   HDL 21 (L) 07/10/2015 1418   CHOLHDL 5.8 (H) 03/03/2016 1503   VLDL 64 (H) 03/03/2016 1503   LDLCALC 36 03/03/2016 1503   LDLCALC 45  07/10/2015 1418      Wt Readings from Last 3 Encounters:  05/18/17 96 kg (211 lb 9.6 oz)  02/18/17 96.6 kg (213 lb)  02/10/17 95.5 kg (210 lb 9.6 oz)      ASSESSMENT AND PLAN:  # Unstable angina: #   CAD s/p CABG:  Mr. Frickey has severe 3 vessel CAD and all his CABG grafts are occluded with the exception of his LIMA to the LAD.  He has chronic angina that is increasing in severity and occurring after less exertion.  His angina was better controlled after adding Ranexa, but lately it has been worse.  Imdur has not helped.  We will increase Imdur to 60mg daily.  Plan for repeat LHC.  He may need extensive stenting or re-do CABG.  Continue aspirin, atorvastatin, metoprolol and ranolazine.  Unable to afford cardiac rehab. Check an echo.  # Hypertension: BP is well-controlled. Continue lisinopril, metoprolol, and Imdur as above.   # Hyperlipidemia:  LDL 36 02/2016.  Continue atorvastatin and fish oil.   # Claudication: ABIs 0.8 bilaterally and medical management was recommended in 2016.  Unchanged on repeat.  Encouraged ambulation. Repeat ABIs in 1 year.  Continue aspirin and statin.   # Carotid stenosis: Carotid Doppler revealed 50-49% R ICA stenosis and 1-39% stenosis on the left.  Continue aspirin and atorvastatin.   Current medicines are reviewed at length with the patient today.  The patient does not have concerns regarding medicines.  The following changes have been made:  Increase Imdur to 60mg daily.  Labs/ tests ordered today include:   Orders Placed This Encounter  Procedures  . CBC with Differential/Platelet  . Basic metabolic panel  . PTT  . INR/PT  . ECHOCARDIOGRAM COMPLETE     Disposition:   FU with Kimmy Totten C. Aledo, MD in 2 months.   Signed, Shatika Grinnell Pitt, MD  05/18/2017 4:11 PM    [facility] 

## 2017-05-24 ENCOUNTER — Encounter (HOSPITAL_COMMUNITY): Payer: Self-pay | Admitting: Cardiology

## 2017-05-24 ENCOUNTER — Telehealth: Payer: Self-pay | Admitting: *Deleted

## 2017-05-24 NOTE — Telephone Encounter (Signed)
Spoke with patient and reviewed cath instructions  Patient has not resumed Metformin and will remain off until 48 hours after procedure

## 2017-05-25 ENCOUNTER — Ambulatory Visit (HOSPITAL_COMMUNITY)
Admission: RE | Admit: 2017-05-25 | Discharge: 2017-05-26 | Disposition: A | Discharge status: Home / Self Care | Payer: No Typology Code available for payment source | Source: Ambulatory Visit | Attending: Cardiology | Admitting: Cardiology

## 2017-05-25 ENCOUNTER — Ambulatory Visit (HOSPITAL_COMMUNITY)
Admission: RE | Disposition: A | Discharge status: Home / Self Care | Payer: Self-pay | Source: Ambulatory Visit | Attending: Cardiology

## 2017-05-25 DIAGNOSIS — Z955 Presence of coronary angioplasty implant and graft: Secondary | ICD-10-CM

## 2017-05-25 DIAGNOSIS — Z87891 Personal history of nicotine dependence: Secondary | ICD-10-CM | POA: Insufficient documentation

## 2017-05-25 DIAGNOSIS — I200 Unstable angina: Secondary | ICD-10-CM | POA: Diagnosis present

## 2017-05-25 DIAGNOSIS — E785 Hyperlipidemia, unspecified: Secondary | ICD-10-CM | POA: Diagnosis present

## 2017-05-25 DIAGNOSIS — Z7982 Long term (current) use of aspirin: Secondary | ICD-10-CM | POA: Insufficient documentation

## 2017-05-25 DIAGNOSIS — I739 Peripheral vascular disease, unspecified: Secondary | ICD-10-CM | POA: Diagnosis present

## 2017-05-25 DIAGNOSIS — E7800 Pure hypercholesterolemia, unspecified: Secondary | ICD-10-CM | POA: Insufficient documentation

## 2017-05-25 DIAGNOSIS — E119 Type 2 diabetes mellitus without complications: Secondary | ICD-10-CM | POA: Insufficient documentation

## 2017-05-25 DIAGNOSIS — E11319 Type 2 diabetes mellitus with unspecified diabetic retinopathy without macular edema: Secondary | ICD-10-CM | POA: Insufficient documentation

## 2017-05-25 DIAGNOSIS — I10 Essential (primary) hypertension: Secondary | ICD-10-CM | POA: Diagnosis present

## 2017-05-25 DIAGNOSIS — E1151 Type 2 diabetes mellitus with diabetic peripheral angiopathy without gangrene: Secondary | ICD-10-CM | POA: Diagnosis present

## 2017-05-25 DIAGNOSIS — Z8673 Personal history of transient ischemic attack (TIA), and cerebral infarction without residual deficits: Secondary | ICD-10-CM

## 2017-05-25 DIAGNOSIS — Z7984 Long term (current) use of oral hypoglycemic drugs: Secondary | ICD-10-CM | POA: Insufficient documentation

## 2017-05-25 DIAGNOSIS — I25119 Atherosclerotic heart disease of native coronary artery with unspecified angina pectoris: Secondary | ICD-10-CM | POA: Diagnosis present

## 2017-05-25 DIAGNOSIS — I2510 Atherosclerotic heart disease of native coronary artery without angina pectoris: Secondary | ICD-10-CM

## 2017-05-25 DIAGNOSIS — I25709 Atherosclerosis of coronary artery bypass graft(s), unspecified, with unspecified angina pectoris: Secondary | ICD-10-CM | POA: Insufficient documentation

## 2017-05-25 DIAGNOSIS — I6523 Occlusion and stenosis of bilateral carotid arteries: Secondary | ICD-10-CM | POA: Insufficient documentation

## 2017-05-25 DIAGNOSIS — I110 Hypertensive heart disease with heart failure: Secondary | ICD-10-CM | POA: Insufficient documentation

## 2017-05-25 DIAGNOSIS — Z9861 Coronary angioplasty status: Secondary | ICD-10-CM

## 2017-05-25 DIAGNOSIS — Z951 Presence of aortocoronary bypass graft: Secondary | ICD-10-CM

## 2017-05-25 HISTORY — DX: Dyspnea, unspecified: R06.00

## 2017-05-25 HISTORY — PX: CORONARY BALLOON ANGIOPLASTY: CATH118233

## 2017-05-25 HISTORY — PX: CORONARY STENT INTERVENTION: CATH118234

## 2017-05-25 LAB — GLUCOSE, CAPILLARY
GLUCOSE-CAPILLARY: 180 mg/dL — AB (ref 65–99)
Glucose-Capillary: 148 mg/dL — ABNORMAL HIGH (ref 65–99)
Glucose-Capillary: 126 mg/dL — ABNORMAL HIGH (ref 65–99)

## 2017-05-25 LAB — POCT ACTIVATED CLOTTING TIME: ACTIVATED CLOTTING TIME: 538 s

## 2017-05-25 SURGERY — CORONARY STENT INTERVENTION
Anesthesia: LOCAL

## 2017-05-25 MED ORDER — CLOPIDOGREL BISULFATE 300 MG PO TABS
ORAL_TABLET | ORAL | Status: AC
  Filled 2017-05-25: qty 1

## 2017-05-25 MED ORDER — SODIUM CHLORIDE 0.9% FLUSH: 3.0000 mL | INTRAVENOUS | Status: DC | PRN

## 2017-05-25 MED ORDER — ASPIRIN EC 81 MG PO TBEC
81.0000 mg | DELAYED_RELEASE_TABLET | Freq: Every day | ORAL | Status: DC
  Administered 2017-05-26: 10:00:00 81 mg via ORAL
  Filled 2017-05-25: qty 1

## 2017-05-25 MED ORDER — FENTANYL CITRATE (PF) 100 MCG/2ML IJ SOLN
INTRAMUSCULAR | Status: AC
  Filled 2017-05-25: qty 2

## 2017-05-25 MED ORDER — NITROGLYCERIN IN D5W 200-5 MCG/ML-% IV SOLN
INTRAVENOUS | Status: AC | PRN
  Administered 2017-05-25: 10 ug/min via INTRAVENOUS

## 2017-05-25 MED ORDER — ATORVASTATIN CALCIUM 40 MG PO TABS
40.0000 mg | ORAL_TABLET | Freq: Every evening | ORAL | Status: DC
  Administered 2017-05-25: 40 mg via ORAL
  Filled 2017-05-25: qty 1

## 2017-05-25 MED ORDER — IOPAMIDOL (ISOVUE-370) INJECTION 76%
INTRAVENOUS | Status: DC | PRN
  Administered 2017-05-25: 260 mL

## 2017-05-25 MED ORDER — ANGIOPLASTY BOOK
Freq: Once | Status: AC
  Administered 2017-05-25: 21:00:00
  Filled 2017-05-25: qty 1

## 2017-05-25 MED ORDER — MIDAZOLAM HCL 2 MG/2ML IJ SOLN
INTRAMUSCULAR | Status: DC | PRN
  Administered 2017-05-25: 2 mg via INTRAVENOUS

## 2017-05-25 MED ORDER — IOPAMIDOL (ISOVUE-370) INJECTION 76%
INTRAVENOUS | Status: AC
  Filled 2017-05-25: qty 50

## 2017-05-25 MED ORDER — SODIUM CHLORIDE 0.9% FLUSH
3.0000 mL | Freq: Two times a day (BID) | INTRAVENOUS | Status: DC
  Administered 2017-05-25: 17:00:00 3 mL via INTRAVENOUS

## 2017-05-25 MED ORDER — SODIUM CHLORIDE 0.9 % WEIGHT BASED INFUSION: 1.0000 mL/kg/h | INTRAVENOUS | Status: DC

## 2017-05-25 MED ORDER — IOPAMIDOL (ISOVUE-370) INJECTION 76%
INTRAVENOUS | Status: AC
  Filled 2017-05-25: qty 100

## 2017-05-25 MED ORDER — NITROGLYCERIN 1 MG/10 ML FOR IR/CATH LAB
INTRA_ARTERIAL | Status: AC
  Filled 2017-05-25: qty 10

## 2017-05-25 MED ORDER — SODIUM CHLORIDE 0.9% FLUSH: 3.0000 mL | Freq: Two times a day (BID) | INTRAVENOUS | Status: DC

## 2017-05-25 MED ORDER — SODIUM CHLORIDE 0.9 % IV SOLN
INTRAVENOUS | Status: DC | PRN
  Administered 2017-05-25: 1.75 mg/kg/h
  Administered 2017-05-25: 1.75 mg/kg/h via INTRAVENOUS

## 2017-05-25 MED ORDER — LINAGLIPTIN 5 MG PO TABS
5.0000 mg | ORAL_TABLET | Freq: Every day | ORAL | Status: DC
Start: 2017-05-26 — End: 2017-05-26
  Administered 2017-05-26: 5 mg via ORAL
  Filled 2017-05-25: qty 1

## 2017-05-25 MED ORDER — SODIUM CHLORIDE 0.9 % IV SOLN: 250.0000 mL | INTRAVENOUS | Status: DC | PRN

## 2017-05-25 MED ORDER — SODIUM CHLORIDE 0.9 % WEIGHT BASED INFUSION
3.0000 mL/kg/h | INTRAVENOUS | Status: DC
  Administered 2017-05-25: 3 mL/kg/h via INTRAVENOUS

## 2017-05-25 MED ORDER — RANOLAZINE ER 500 MG PO TB12
1000.0000 mg | ORAL_TABLET | Freq: Two times a day (BID) | ORAL | Status: DC
  Administered 2017-05-25 – 2017-05-26 (×2): 1000 mg via ORAL
  Filled 2017-05-25 (×2): qty 2

## 2017-05-25 MED ORDER — LIDOCAINE HCL (PF) 1 % IJ SOLN
INTRAMUSCULAR | Status: DC | PRN
  Administered 2017-05-25: 14 mL

## 2017-05-25 MED ORDER — BIVALIRUDIN TRIFLUOROACETATE 250 MG IV SOLR
INTRAVENOUS | Status: AC
  Filled 2017-05-25: qty 250

## 2017-05-25 MED ORDER — LISINOPRIL 10 MG PO TABS
10.0000 mg | ORAL_TABLET | Freq: Every day | ORAL | Status: DC
  Administered 2017-05-25 – 2017-05-26 (×2): 10 mg via ORAL
  Filled 2017-05-25 (×2): qty 1

## 2017-05-25 MED ORDER — ACTIVE PARTNERSHIP FOR HEALTH OF YOUR HEART BOOK
Freq: Once | Status: AC
  Administered 2017-05-25: 21:00:00
  Filled 2017-05-25: qty 1

## 2017-05-25 MED ORDER — ASPIRIN 81 MG PO CHEW: 81.0000 mg | CHEWABLE_TABLET | ORAL | Status: DC

## 2017-05-25 MED ORDER — CLOPIDOGREL BISULFATE 75 MG PO TABS
75.0000 mg | ORAL_TABLET | Freq: Every day | ORAL | Status: DC
  Administered 2017-05-26: 10:00:00 75 mg via ORAL
  Filled 2017-05-25: qty 1

## 2017-05-25 MED ORDER — HEPARIN (PORCINE) IN NACL 2-0.9 UNIT/ML-% IJ SOLN
INTRAMUSCULAR | Status: AC | PRN
  Administered 2017-05-25: 1000 mL

## 2017-05-25 MED ORDER — NITROGLYCERIN 1 MG/10 ML FOR IR/CATH LAB
INTRA_ARTERIAL | Status: DC | PRN
Start: 2017-05-25 — End: 2017-05-25
  Administered 2017-05-25 (×3): 200 ug via INTRACORONARY

## 2017-05-25 MED ORDER — CLOPIDOGREL BISULFATE 300 MG PO TABS
ORAL_TABLET | ORAL | Status: DC | PRN
  Administered 2017-05-25: 600 mg via ORAL

## 2017-05-25 MED ORDER — BIVALIRUDIN BOLUS VIA INFUSION - CUPID
INTRAVENOUS | Status: DC | PRN
  Administered 2017-05-25: 71.775 mg via INTRAVENOUS

## 2017-05-25 MED ORDER — LIDOCAINE HCL (PF) 1 % IJ SOLN
INTRAMUSCULAR | Status: AC
  Filled 2017-05-25: qty 30

## 2017-05-25 MED ORDER — ACETAMINOPHEN 325 MG PO TABS: 650.0000 mg | ORAL_TABLET | ORAL | Status: DC | PRN

## 2017-05-25 MED ORDER — FENTANYL CITRATE (PF) 100 MCG/2ML IJ SOLN
INTRAMUSCULAR | Status: DC | PRN
  Administered 2017-05-25 (×2): 25 ug via INTRAVENOUS

## 2017-05-25 MED ORDER — NITROGLYCERIN IN D5W 200-5 MCG/ML-% IV SOLN
INTRAVENOUS | Status: AC
  Filled 2017-05-25: qty 250

## 2017-05-25 MED ORDER — NITROGLYCERIN IN D5W 200-5 MCG/ML-% IV SOLN: 0.0000 ug/min | INTRAVENOUS | Status: DC

## 2017-05-25 MED ORDER — HEPARIN (PORCINE) IN NACL 2-0.9 UNIT/ML-% IJ SOLN
INTRAMUSCULAR | Status: AC
  Filled 2017-05-25: qty 1000

## 2017-05-25 MED ORDER — LABETALOL HCL 5 MG/ML IV SOLN: 10.0000 mg | INTRAVENOUS | Status: AC | PRN

## 2017-05-25 MED ORDER — ONDANSETRON HCL 4 MG/2ML IJ SOLN: 4.0000 mg | Freq: Four times a day (QID) | INTRAMUSCULAR | Status: DC | PRN

## 2017-05-25 MED ORDER — IOPAMIDOL (ISOVUE-370) INJECTION 76%
INTRAVENOUS | Status: AC
Start: 2017-05-25 — End: ?
  Filled 2017-05-25: qty 100

## 2017-05-25 MED ORDER — METOPROLOL SUCCINATE ER 100 MG PO TB24
100.0000 mg | ORAL_TABLET | Freq: Every day | ORAL | Status: DC
  Administered 2017-05-25 – 2017-05-26 (×2): 100 mg via ORAL
  Filled 2017-05-25 (×2): qty 1

## 2017-05-25 MED ORDER — HYDRALAZINE HCL 20 MG/ML IJ SOLN: 5.0000 mg | INTRAMUSCULAR | Status: AC | PRN

## 2017-05-25 MED ORDER — MIDAZOLAM HCL 2 MG/2ML IJ SOLN
INTRAMUSCULAR | Status: AC
  Filled 2017-05-25: qty 2

## 2017-05-25 MED ORDER — NITROGLYCERIN 0.4 MG SL SUBL: 0.4000 mg | SUBLINGUAL_TABLET | SUBLINGUAL | Status: DC | PRN

## 2017-05-25 MED ORDER — SODIUM CHLORIDE 0.9 % IV SOLN
INTRAVENOUS | Status: AC
  Administered 2017-05-25: 17:00:00 via INTRAVENOUS

## 2017-05-25 SURGICAL SUPPLY — 28 items
BALLN SAPPHIRE 2.0X12 (BALLOONS) ×2
BALLN SAPPHIRE 2.0X15 (BALLOONS) ×2
BALLN SAPPHIRE ~~LOC~~ 2.5X15 (BALLOONS) ×1 IMPLANT
BALLN SAPPHIRE ~~LOC~~ 3.5X8 (BALLOONS) ×1 IMPLANT
BALLN WOLVERINE 2.50X10 (BALLOONS) ×2
BALLN ~~LOC~~ EUPHORA RX 3.25X8 (BALLOONS) ×2
BALLOON SAPPHIRE 2.0X12 (BALLOONS) IMPLANT
BALLOON SAPPHIRE 2.0X15 (BALLOONS) IMPLANT
BALLOON WOLVERINE 2.50X10 (BALLOONS) IMPLANT
BALLOON ~~LOC~~ EUPHORA RX 3.25X8 (BALLOONS) IMPLANT
CATH VISTA GUIDE 7FRXB LAD 3.5 (CATHETERS) ×1 IMPLANT
COVER PRB 48X5XTLSCP FOLD TPE (BAG) IMPLANT
COVER PROBE 5X48 (BAG) ×2
ELECT DEFIB PAD ADLT CADENCE (PAD) ×1 IMPLANT
KIT ENCORE 26 ADVANTAGE (KITS) ×1 IMPLANT
KIT HEART LEFT (KITS) ×2 IMPLANT
PACK CARDIAC CATHETERIZATION (CUSTOM PROCEDURE TRAY) ×2 IMPLANT
SHEATH PINNACLE 7F 10CM (SHEATH) ×1 IMPLANT
STENT SYNERGY DES 2.25X16 (Permanent Stent) ×1 IMPLANT
STENT SYNERGY DES 2.25X20 (Permanent Stent) ×1 IMPLANT
STENT SYNERGY DES 3X16 (Permanent Stent) ×1 IMPLANT
TRANSDUCER W/STOPCOCK (MISCELLANEOUS) ×2 IMPLANT
TUBING CIL FLEX 10 FLL-RA (TUBING) ×2 IMPLANT
VALVE GUARDIAN II ~~LOC~~ HEMO (MISCELLANEOUS) ×1 IMPLANT
WIRE ASAHI PROWATER 180CM (WIRE) ×1 IMPLANT
WIRE EMERALD 3MM-J .035X150CM (WIRE) ×1 IMPLANT
WIRE HI TORQ BMW 190CM (WIRE) ×1 IMPLANT
WIRE MARVEL STR TIP 190CM (WIRE) ×2 IMPLANT

## 2017-05-25 NOTE — Interval H&P Note (Signed)
History and Physical Interval Note:  05/25/2017 12:41 PM  Jason Velasquez  has presented today for surgery, with the diagnosis of cad with progressive angina. Jason Velasquez underwent diagnostic cardiac catheterization 2 days ago revealing progression of disease in the distal left main-proximal circumflex. As he is now had progression of anginal symptoms despite being on maximal therapy, the decision was made to proceed with high-risk protected left main PCI.   The various methods of treatment have been discussed with the patient and family. After consideration of risks, benefits and other options for treatment, the patient has consented to  Procedure(s): CORONARY STENT INTERVENTION (N/A) as a surgical intervention .  The patient's history has been reviewed, patient examined, no change in status, stable for surgery.  I have reviewed the patient's chart and labs.  Questions were answered to the patient's satisfaction.    Cath Lab Visit (complete for each Cath Lab visit)  Clinical Evaluation Leading to the Procedure:   ACS: No.  Non-ACS:    Anginal Classification: CCS IV  Anti-ischemic medical therapy: Maximal Therapy (2 or more classes of medications)  Non-Invasive Test Results: High-risk stress test findings: cardiac mortality >3%/year  Prior CABG: Previous CABG   [name]

## 2017-05-25 NOTE — H&P (View-Only) (Signed)
Cardiology Office Note   Date:  05/18/2017   ID:  Jason Velasquez, DOB [birth year], MRN [id number]  PCP:  [name], DO  Cardiologist:   Skeet Latch, MD   No chief complaint on file.     Patient ID: Jason Velasquez is a 61 y.o. male with CAD s/p CABG, chronic stable angina, PAD, hypertension, diabetes and CVA who presents for follow up.  Jason Velasquez was first evaluated for chest pain on 07/27/15.  He previously underwent CABG in 2007. He had a nuclear stress test on 08/21/15 revealed LVEF 57% with moderate ischemia in the inferoseptal, inferior, and anterior locations. He subsequently underwent left heart catheterization on 08/26/15 that revealed severe three vessel disease including a 90% left main lesion and an occluded RCA and LAD. His LIMA to the LAD was patent but all other vein grafts were occluded. There were no optimal PCI targets.  He was started on carvedilol and Ranexa. Jason Velasquez also underwent ABI testing 08/2015 that revealed diffuse atherosclerosis from the proximal abdominal aorta through the common and external iliac arteries bilaterally. There was no detectable flow in the L peroneal or anterior tibial arteries. ABIs were near 0.8 bilaterally. He was referred to Dr. Gwenlyn Found where medical management was advised. He was noted to have carotid bruits and was referred for carotid ultrasound on 12/09/15.  This revealed 40-59% stenosis on the right and 1-39% stenosis on the left.  He had repeat LE Dopplers and ABIs 03/2017 that were unchanged from prior.  Medical management was recommended.    At his last appointment Jason Velasquez continued to report angina despite Ranexa.  Imdur was added to his regimen.  He continues to report feeling chest pain last night he had an episode that occurred while sleeping. He was unable to go back to sleep. He did not take any sublingual nitroglycerin because it was almost time to take Imdur.  He reports chest pain with minimal exertion. He is unable to  walk for more than 10 minutes without getting chest pain. This is associated with shortness of breath and diaphoresis. He also occasionally has nausea. The episodes last for approximately 10 Periodminutes an subside with rest. He continues to report claudication in bilateral legs.   he denies lower extremity edema, orthopnea, or PND.    Past Medical History:  Diagnosis Date  . Angina, class III ([place]) 08/24/2015  . CAD (coronary artery disease) of artery bypass graft 06/25/2014   CABG- 2007   . CAD (coronary artery disease), native coronary artery 06/25/2014   CABG- 2007   . Carotid stenosis 03/03/2016   97-67% RICA, 3-41% LICA, >93% RECA stenosis Repeat 12/2016.  . Claudication ([place])   . Controlled type 2 diabetes mellitus without complication ([place]) 04/12/239   Dx March of 2016. GAD65 negative Anti Islet cell negative    . DKA (diabetic ketoacidoses) ([place]) 12/07/2014  . History of CVA (cerebrovascular accident) 07/12/2014  . Hx of adenomatous colonic polyps 05/27/2015  . Hypercholesterolemia   . Hypertension   . Hypertensive heart disease 03/03/2016  . S/P CABG (coronary artery bypass graft) 03/28/2015   2007 at [facility]   . Stroke ([place])    2015  . Tubular adenoma of colon 05/31/2015   Colonoscopy 05/20/15 with [name]: 3 tubullar adenoma's largest 2.3cm.  Plan for repeat Colonoscopy in 2019.    Past Surgical History:  Procedure Laterality Date  . CARDIAC CATHETERIZATION N/A 08/26/2015   Procedure: Left Heart Cath and Cors/Grafts Angiography;  Surgeon: [name], MD;  Location: [facility];  Service: Cardiovascular;  Laterality: N/A;  . CARDIAC SURGERY    . CORONARY ARTERY BYPASS GRAFT      Current Outpatient Prescriptions  Medication Sig Dispense Refill  . aspirin EC 81 MG tablet Take 81 mg by mouth daily.    Marland Kitchen atorvastatin (LIPITOR) 40 MG tablet Take 1 tablet (40 mg total) by mouth daily. 30 tablet 11  . Lancets MISC 1 Device by Does not apply route 4  (four) times daily -  before meals and at bedtime. 100 each 11  . lisinopril (PRINIVIL,ZESTRIL) 10 MG tablet Take 1 tablet (10 mg total) by mouth daily. 90 tablet 3  . metFORMIN (GLUCOPHAGE) 1000 MG tablet Take 1 tablet (1,000 mg total) by mouth 2 (two) times daily with a meal. 60 tablet 11  . metoprolol succinate (TOPROL XL) 100 MG 24 hr tablet Take 1 tablet (100 mg total) by mouth daily. Take with or immediately following a meal. 30 tablet 11  . Omega-3 Fatty Acids (FISH OIL) 1000 MG CAPS Take by mouth daily.    . ranolazine (RANEXA) 1000 MG SR tablet Take 1 tablet (1,000 mg total) by mouth 2 (two) times daily. 180 tablet 3  . sitaGLIPtin (JANUVIA) 100 MG tablet Take 1 tablet (100 mg total) by mouth daily. 30 tablet 11  . isosorbide mononitrate (IMDUR) 60 MG 24 hr tablet Take 1 tablet (60 mg total) by mouth daily. 30 tablet 5  . nitroGLYCERIN (NITROSTAT) 0.4 MG SL tablet Place 1 tablet (0.4 mg total) under the tongue every 5 (five) minutes as needed for chest pain. 25 tablet 5   No current facility-administered medications for this visit.     Allergies:   Patient has no known allergies.    Social History:  The patient  reports that he quit smoking about 7 years ago. He has never used smokeless tobacco. He reports that he does not drink alcohol or use drugs.   Family History:  The patient's family history includes Diabetes in his father; Hypertension in his mother and sister.    ROS:  Please see the history of present illness.   Otherwise, review of systems are positive for none.   All other systems are reviewed and negative.    PHYSICAL EXAM: VS:  BP 130/70 (BP Location: Left Arm, Patient Position: Sitting)   Pulse 87   Ht 5\' 10"  (1.778 m)   Wt 96 kg (211 lb 9.6 oz)   BMI 30.36 kg/m  , BMI Body mass index is 30.36 kg/m. GENERAL:  Well appearing.  No acute distress HEENT:  Pupils equal round and reactive, fundi not visualized, oral mucosa unremarkable NECK:  No jugular venous  distention, waveform within normal limits, carotid upstroke brisk and symmetric, no carotid bruit LUNGS:  Clear to auscultation bilaterally.   HEART:  RRR.  PMI not displaced or sustained,S1 and S2 within normal limits, no S3, no S4, no clicks, no rubs, no murmurs ABD:  Flat, positive bowel sounds normal in frequency in pitch, no bruits, no rebound, no guarding, no midline pulsatile mass, no hepatomegaly, no splenomegaly EXT:  1+ R PT.  Absent DP.  Absent L PT or DP.  No edema, no cyanosis no clubbing.  2+ radial pulses.  SKIN:  No rashes no nodules.  Lack of hair in lower tibia NEURO:  Cranial nerves II through XII grossly intact, motor grossly intact throughout PSYCH:  Cognitively intact, oriented to person place and  time   EKG:  EKG is ordered today. The ekg ordered today demonstrates sinus rhythm rate 82 bpm.  Prior inferior infarct.  07/02/16: Sinus rhythm rate 75 bpm.  Prior inferior infarct  12/29/16: Sinus rhythm.  Incomplete RBBB.  Prior inferior infarct.  05/18/17: Sinus rhythm. Rate 87 bpm. B 1 PVCs.  Lexiscan Cardiolite 08/21/15:  Nuclear stress EF: 57%.  The left ventricular ejection fraction is normal (55-65%).  There was no ST segment deviation noted during stress.  No T wave inversion was noted during stress.  Defect 1: There is a small defect of moderate severity present in the basal inferoseptal and basal inferior location.  Defect 2: There is a defect present in the basal anterior, mid anterior and apical anterior location.  Findings consistent with ischemia.  This is a high risk study.  High risk stress nuclear study with two areas of ischemia and normal left ventricular regional and global systolic function. The inferior area of ischemia is small. The anterior area of ischemia is extensive, but appears to be very mild. The septum is spared.  LHC 08/26/15:  LM lesion, 90% stenosed. Involving both LAD and circumflex ostia  Prox RCA lesion, 90% stenosed. Mid  RCA lesion, 90% stenosed. Dist RCA lesion, 100% stenosed.  SVG -(presumably to distal RCA) was not visualized . Likely flush occluded  Ost LAD lesion, 80% stenosed.  Ost Cx lesion, 90% stenosed. Prox Cx lesion, 80% stenosed.  Ost 1st Mrg to 1st Mrg lesion, 85% stenosed.  SVG-OM1 Prox Graft to Dist Graft lesion, 100% stenosed. Short stump noted at the ostium as well as at insertion site, this would suggest chronic occlusion  Mid Cx lesion, 95% stenosed. 3rd Mrg lesion, 100% stenosed.  Ost LAD to Mid LAD lesion, dilated, ectatic disease prior to 100% occlusion.  LIMA-LAD was injected is normal in caliber, widely patent to a wraparound LAD   Severe native vessel disease including left main stenosis with occluded RCA and LAD. Also subtotal occluded OM1 and total occluded OM 3. It is not clear what the grafts went to but both vein grafts are occluded.  LIMA-LAD is widely patent.   Carotid Doppler 12/11/15: 42-59% RICA, 5-63% LICA, >87% RECA stenosis  ABI 08/12/15: R 0.81, L 0.89 Diffuse atherosclerosis from the proximal abdominal aorta into both tibial arteries. Several areas of narrowing are noted in both lower extremities, although no focal significant stenosis is identified. One vessel run-off on the right, and three vessel run-off on the left.  ABI 02/03/17: R 0.81, L 0.91 Abnormal lower arterial Doppler study, with waveforms suggestive of inflow disease. The bilateral ABI's are stable and in the mild range. The right great toe pressure is normal. The left great toe pressure is abnormal   Recent Labs: 07/29/2016: BUN 12; Creatinine, Ser 1.29; Potassium 4.5; Sodium 140    Lipid Panel    Component Value Date/Time   CHOL 121 (L) 03/03/2016 1503   CHOL 129 07/10/2015 1418   TRIG 320 (H) 03/03/2016 1503   HDL 21 (L) 03/03/2016 1503   HDL 21 (L) 07/10/2015 1418   CHOLHDL 5.8 (H) 03/03/2016 1503   VLDL 64 (H) 03/03/2016 1503   LDLCALC 36 03/03/2016 1503   LDLCALC 45  07/10/2015 1418      Wt Readings from Last 3 Encounters:  05/18/17 96 kg (211 lb 9.6 oz)  02/18/17 96.6 kg (213 lb)  02/10/17 95.5 kg (210 lb 9.6 oz)      ASSESSMENT AND PLAN:  # Unstable angina: #  CAD s/p CABG:  Mr. Brisby has severe 3 vessel CAD and all his CABG grafts are occluded with the exception of his LIMA to the LAD.  He has chronic angina that is increasing in severity and occurring after less exertion.  His angina was better controlled after adding Ranexa, but lately it has been worse.  Imdur has not helped.  We will increase Imdur to 60mg  daily.  Plan for repeat LHC.  He may need extensive stenting or re-do CABG.  Continue aspirin, atorvastatin, metoprolol and ranolazine.  Unable to afford cardiac rehab. Check an echo.  # Hypertension: BP is well-controlled. Continue lisinopril, metoprolol, and Imdur as above.   # Hyperlipidemia:  LDL 36 02/2016.  Continue atorvastatin and fish oil.   # Claudication: ABIs 0.8 bilaterally and medical management was recommended in 2016.  Unchanged on repeat.  Encouraged ambulation. Repeat ABIs in 1 year.  Continue aspirin and statin.   # Carotid stenosis: Carotid Doppler revealed 50-49% R ICA stenosis and 1-39% stenosis on the left.  Continue aspirin and atorvastatin.   Current medicines are reviewed at length with the patient today.  The patient does not have concerns regarding medicines.  The following changes have been made:  Increase Imdur to 60mg  daily.  Labs/ tests ordered today include:   Orders Placed This Encounter  Procedures  . CBC with Differential/Platelet  . Basic metabolic panel  . PTT  . INR/PT  . ECHOCARDIOGRAM COMPLETE     Disposition:   FU with Rudi Bunyard C. Oval Linsey, MD in 2 months.   Signed, Skeet Latch, MD  05/18/2017 4:11 PM    [place]

## 2017-05-25 NOTE — Progress Notes (Signed)
Site area: right groin  Site Prior to Removal:  Level 0  Pressure Applied For 20 MINUTES    Minutes Beginning at 1745  Manual:   Yes.    Patient Status During Pull:  AAO X 4  Post Pull Groin Site:  Level 0  Post Pull Instructions Given:  Yes.    Post Pull Pulses Present:  Yes.    Dressing Applied:  Yes.    Comments:  Tolerated procedure well

## 2017-05-26 ENCOUNTER — Encounter (HOSPITAL_COMMUNITY): Payer: Self-pay | Admitting: Cardiology

## 2017-05-26 DIAGNOSIS — Z9861 Coronary angioplasty status: Secondary | ICD-10-CM

## 2017-05-26 DIAGNOSIS — I25709 Atherosclerosis of coronary artery bypass graft(s), unspecified, with unspecified angina pectoris: Secondary | ICD-10-CM

## 2017-05-26 DIAGNOSIS — I2510 Atherosclerotic heart disease of native coronary artery without angina pectoris: Secondary | ICD-10-CM

## 2017-05-26 LAB — BASIC METABOLIC PANEL
ANION GAP: 8 (ref 5–15)
BUN: 9 mg/dL (ref 6–20)
CALCIUM: 8.6 mg/dL — AB (ref 8.9–10.3)
CHLORIDE: 106 mmol/L (ref 101–111)
CO2: 22 mmol/L (ref 22–32)
Creatinine, Ser: 0.94 mg/dL (ref 0.61–1.24)
GFR calc non Af Amer: >60 mL/min (ref >60)
GLUCOSE: 123 mg/dL — AB (ref 65–99)
POTASSIUM: 4 mmol/L (ref 3.5–5.1)
Sodium: 136 mmol/L (ref 135–145)

## 2017-05-26 LAB — CBC
HEMATOCRIT: 32.1 % — AB (ref 39.0–52.0)
HEMOGLOBIN: 10 g/dL — AB (ref 13.0–17.0)
MCH: 23.1 pg — AB (ref 26.0–34.0)
MCHC: 31.2 g/dL (ref 30.0–36.0)
MCV: 74.1 fL — AB (ref 78.0–100.0)
Platelets: 254 10*3/uL (ref 150–400)
RBC: 4.33 MIL/uL (ref 4.22–5.81)
RDW: 18.5 % — ABNORMAL HIGH (ref 11.5–15.5)
WBC: 5.2 10*3/uL (ref 4.0–10.5)

## 2017-05-26 LAB — GLUCOSE, CAPILLARY: Glucose-Capillary: 114 mg/dL — ABNORMAL HIGH (ref 65–99)

## 2017-05-26 MED ORDER — METFORMIN HCL 1000 MG PO TABS: 1000.0000 mg | ORAL_TABLET | Freq: Two times a day (BID) | ORAL | 11 refills | Status: DC

## 2017-05-26 MED ORDER — CLOPIDOGREL BISULFATE 75 MG PO TABS: 75.0000 mg | ORAL_TABLET | Freq: Every day | ORAL | 11 refills | Status: DC

## 2017-05-26 MED FILL — Lidocaine HCl Local Preservative Free (PF) Inj 1%: INTRAMUSCULAR | Qty: 30 | Status: AC

## 2017-05-26 NOTE — Progress Notes (Signed)
Progress Note  Patient Name: Jason Velasquez Date of Encounter: 05/26/2017  Primary Cardiologist: Oval Linsey  Subjective   No cp no sob  Inpatient Medications    Scheduled Meds: . aspirin EC  81 mg Oral Daily  . atorvastatin  40 mg Oral QPM  . clopidogrel  75 mg Oral Q breakfast  . linagliptin  5 mg Oral Daily  . lisinopril  10 mg Oral Daily  . metoprolol succinate  100 mg Oral Daily  . ranolazine  1,000 mg Oral BID  . sodium chloride flush  3 mL Intravenous Q12H   Continuous Infusions: . sodium chloride    . nitroGLYCERIN 10 mcg/min (05/25/17 2200)   PRN Meds: sodium chloride, acetaminophen, nitroGLYCERIN, ondansetron (ZOFRAN) IV, sodium chloride flush   Vital Signs    Vitals:   05/25/17 2100 05/25/17 2200 05/26/17 0529 05/26/17 0700  BP: 126/78 (!) 143/73 (!) 143/82 123/75  Pulse:   70 81  Resp: 17 20 16 17   Temp:   98 F (36.7 C) 97.8 F (36.6 C)  TempSrc:   Oral Oral  SpO2: 98% 98% 99% 100%  Weight:   212 lb 8.4 oz (96.4 kg)   Height:        Intake/Output Summary (Last 24 hours) at 05/26/17 0924 Last data filed at 05/26/17 0700  Gross per 24 hour  Intake           2247.5 ml  Output             1400 ml  Net            847.5 ml   Filed Weights   05/25/17 0928 05/26/17 0529  Weight: 211 lb (95.7 kg) 212 lb 8.4 oz (96.4 kg)    Telemetry    No adverse rhythms - Personally Reviewed  ECG    No ST changes  - Personally Reviewed  Physical Exam   GEN: No acute distress.   Neck: No JVD Cardiac: RRR, no murmurs, rubs, or gallops.  Respiratory: Clear to auscultation bilaterally. GI: Soft, nontender, non-distended  MS: No edema; No deformity. Cath site normal Neuro:  Nonfocal  Psych: Normal affect   Labs    Chemistry Recent Labs Lab 05/26/17 0425  NA 136  K 4.0  CL 106  CO2 22  GLUCOSE 123*  BUN 9  CREATININE 0.94  CALCIUM 8.6*  GFRNONAA >60  GFRAA >60  ANIONGAP 8     Hematology Recent Labs Lab 05/26/17 0425  WBC 5.2  RBC 4.33    HGB 10.0*  HCT 32.1*  MCV 74.1*  MCH 23.1*  MCHC 31.2  RDW 18.5*  PLT 254    Cardiac EnzymesNo results for input(s): TROPONINI in the last 168 hours. No results for input(s): TROPIPOC in the last 168 hours.   BNPNo results for input(s): BNP, PROBNP in the last 168 hours.   DDimer No results for input(s): DDIMER in the last 168 hours.   Radiology    No results found.  Cardiac Studies    Lesion #3: DLM lesion, 90 %stenosed.Ost Cx lesion, 95 %stenosed. Prox Cx lesion, 90 %stenosed. --> covered with 1 stent.  A STENT SYNERGY DES 3X16 drug eluting stent was successfully placed. Postdilated in the left main to 3.6 mm and in the proximal circumflex up to 3.4 mm.  Post intervention, there is a 0% residual stenosis.  Lesion #4 Ost LAD lesion, 85 %stenosed.  Post intervention, [place] Cutting Balloon PTCA only (through the left main-circumflex stent there is  a 30% residual stenosis.  Lesion #2: Ost 1st Mrg to 1st Mrg lesion, 75 %stenosed.  A STENT SYNERGY DES 2.25X20 drug eluting stent was successfully placed. -With the proximal portion stented through. -  Post intervention, there is a 0% residual stenosis - in the majority a stent, with the ostial portion as roughly 50% stenosis.  Lesion 1: Mid Cx lesion, 95 %stenosed.  A STENT SYNERGY DES 2.25X16 drug eluting stent was successfully placed.  Post intervention, there is a 0% residual stenosis.  3rd Mrg lesion, 100 %stenosed.  Mid LAD lesion, 100 %stenosed.   Successful multisite PCI of essentially bifurcation distal left main-ostial proximal circumflex and ostial LAD treated with a stent from the distal left main into proximal circumflex crossing OM1. The stent was placed in OM1 to the graft insertion site. The mid cervix is also treated with a third stent. The ostial LAD was treated with cutting balloon reducing the 85% lesion down to roughly 30%.  Plan:  Admit for overnight observation post PCI (outpatient extended  recovery).  Sheath removal per protocol.  We will run IV nitroglycerin overnight treat possible spasm.  Dual antiplatelet therapy essentially with Plavix lifelong.  Patient Profile   61 y.o. male CAD, CABG, PCI as above  Assessment & Plan     - DC  - DAPT  - cath reviewed  - meds reviewed.   - cardiac rehab  - 2-4 week follow up Dr. Oval Linsey or APP  Signed, Candee Furbish, MD  05/26/2017, 9:24 AM

## 2017-05-26 NOTE — Discharge Summary (Signed)
Discharge Summary    Patient ID: Jason Velasquez,  MRN: [id number], DOB/AGE: 1956/08/17 61 y.o.  Admit date: 05/25/2017 Discharge date: 05/26/2017  Primary Care Provider: [name] Primary Cardiologist: Dr. 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 He saw Dr Oval Linsey in the office on 05/18/17  for a follow-up of appointment on 02/10/17 where as Jason Velasquez had been having angina despite maxing out on Ranexa, so she added  Imdur 60 mg to his regimen.  He was first evaluated for chest pain on 07/27/15 had nuc stress with LVEF 57% and moderate ischemia in the inferoseptal, inferior and anterior regions. Underwent a cath (08/26/15) with sev 3 vessel disease including 90% occlusion of his left main and an occluded RCA to LAD. His LIMA to the LAD was patent but all other grafts occluded; no optimal targets so medical therapy recommended. He was started on Ranexa and Carvedilol. ABIs were done due to complaints of LE claudication; 0.8 bilaterally (08/2015), seen by Dr. Gwenlyn Found and medical management advised repeat ABIs (03/2017) showed unchanged from prior. Carotid US (12/09/15) 40-59% Right and 1-39% Left carotid stenosis.  Dr.  Oval Linsey noted (05/18/17) that he was continuing to have CP, even while sleeping. Chest pain with minimal exertion, unable to walk 10 minutes without chest pains, shortness of breath and diaphoresis. Dr. Oval Linsey recommended repeat cardiac catheterization and to obtain and echocardiogram (echo not done).   Hospital Course     Consultants: None  Jason Velasquez underwent a Left Heart Cath and Cors/Graft Angiography on 05/23/2017, with progression of already severe bifurcation distal left main-LAD-cirumnflex lesion in the progression of the proximal cervix lesion as well. Dr. Ellyn Hack felt his best option would be bifurcation PCI. The situation was complex because there were many branches and the nonproductive portion of the LAD as there is in the circumflex. Would likely require bifurcation PCI as best be done in a staged manner. The patient was discharged home with planned staged PCI for August 22.  He returned to the Hospital on August 22 for Coronary Balloon Angioplasty and Coronary Stent Intervention; successful multi site PCI of essentially bifurcation distal left main-ostial proximal circumflex and ostial LAD treated with stent from the distal left main into proximal circumflex crossing OM1. The stent was placed in the OM1 to the graft insertion site. The mid cervix was also treated with a 3rd stent. The Ostial LAD was treated with a cutting balloon reducing the 85% lesion down to roughly 30%. He was admitted for overnight observation and received IV nitroglycerin to treat for possible spasm. Dr. Ellyn Hack recommended dual antiplatelet therapy and lifelong Plavix.  Dr. Marlou Porch saw the patient this morning and feels he is ready for home. Dr.  Marlou Porch  has reviewed the cath report and  medications. He is to go for cardiac rehab. He is to be on dual antiplatelet therapy of aspirin and Plavix (Plavix to be lifelong).  The patient was not receiving Imdur 60 mg at the time of discharge so this has been held at  discharge. He can restart his Metformin on August 25. He has been advised to refrain from taking NSAIDs until cleared to take them again by cardiology.   The patient has had an uncomplicated hospital course and is recovering well. The catheter site is stable. All follow-up appointments have been scheduled. Smoking cessation was disscussed in length. Discharge medications are listed below.  _____________  Discharge Vitals Blood pressure 123/75, pulse 81, temperature 97.8 F (36.6 C), temperature source Oral, resp. rate 17, height 5\' 11"  (1.803 m), weight 212 lb 8.4 oz (96.4 kg), SpO2 100 %.  Filed Weights   05/25/17 0928 05/26/17 0529  Weight: 211 lb (95.7 kg) 212 lb 8.4 oz (96.4 kg)    Labs & Radiologic Studies     CBC  Recent Labs  05/26/17 0425  WBC 5.2  HGB 10.0*  HCT 32.1*  MCV 74.1*  PLT 308   Basic Metabolic Panel  Recent Labs  05/26/17 0425  NA 136  K 4.0  CL 106  CO2 22  GLUCOSE 123*  BUN 9  CREATININE 0.94  CALCIUM 8.6*    Diagnostic Studies/Procedures   Left Heart Cath and Cors/Graft Angiography 05/23/2017    Ost RCA lesion, 40 %stenosed. Prox & mid RCA lesions, 90 %stenosed. Dist RCA lesion, 100 %stenosed.  SVG-RCA Origin lesion, 100 %stenosed. - Unable to engage  SVG-OM proximal 100% occluded  Mid Cx lesion, 95 %stenosed. 3rd Mrg lesion, 100 %stenosed.  Ost LAD to Mid LAD lesion, 5 %stenosed. Extensively aneurysmal poststenotic dilation with 2 diagonal branches  Mid LAD lesion, 100 %stenosed.  LIMA-LAD is normal in caliber. Prox Graft lesion, 50 %stenosed. - Distal LAD is a wraparound LAD with faint left to right collaterals  Origin to Prox Graft lesion, 100 %stenosed.  LM lesion, 95 %stenosed. Ost Cx lesion, 95 %stenosed. Ost LAD lesion, 85 %stenosed.  Prox Cx lesion, 90 %stenosed at 1st Mrg. Ost 1st Mrg to 1st Mrg lesion, 50 %stenosed. (visible stump of SVG)  LV end diastolic pressure is mildly elevated.  The left ventricular  systolic function is normal.  The left ventricular ejection fraction is 55-65% by visual estimate.   Patient does have progression of already severe bifurcation distal left main-LAD-circumflex lesions in the progression of the proximal cervix lesion as well. Likely his best option would be bifurcation PCI, however this is a difficult scenario with there being his many branches and the nonproductive portion of the LAD as there is in the circumflex. Would likely require bifurcation PCI as best be done in a staged manner.  Plan: Discharge to home today after sheath removal. Tentatively schedule for staged PCI on Wednesday, August 22 after discussing with colleagues as to the best option for this bifurcation lesion. I have increased his Imdur dose to 60 twice a day at home. It appears that is worsening angina is angina decubitus.   Coronary Balloon Angioplasty and Coronary Stent Intervention 05/25/17     Lesion #3: DLM lesion, 90 %stenosed.Ost Cx lesion, 95 %stenosed. Prox Cx lesion, 90 %stenosed. --> covered with 1 stent.  A STENT SYNERGY DES 3X16 drug eluting stent was successfully placed. Postdilated in the left main to 3.6 mm and in  the proximal circumflex up to 3.4 mm.  Post intervention, there is a 0% residual stenosis.  Lesion #4 Ost LAD lesion, 85 %stenosed.  Post intervention, [place] Cutting Balloon PTCA only (through the left main-circumflex stent there is a 30% residual stenosis.  Lesion #2: Ost 1st Mrg to 1st Mrg lesion, 75 %stenosed.  A STENT SYNERGY DES 2.25X20 drug eluting stent was successfully placed. -With the proximal portion stented through. -  Post intervention, there is a 0% residual stenosis - in the majority a stent, with the ostial portion as roughly 50% stenosis.  Lesion 1: Mid Cx lesion, 95 %stenosed.  A STENT SYNERGY DES 2.25X16 drug eluting stent was successfully placed.  Post intervention, there is a 0% residual stenosis.  3rd Mrg lesion, 100  %stenosed.  Mid LAD lesion, 100 %stenosed.   Successful multisite PCI of essentially bifurcation distal left main-ostial proximal circumflex and ostial LAD treated with a stent from the distal left main into proximal circumflex crossing OM1. The stent was placed in OM1 to the graft insertion site. The mid cervix is also treated with a third stent. The ostial LAD was treated with cutting balloon reducing the 85% lesion down to roughly 30%.  Plan:  Admit for overnight observation post PCI (outpatient extended recovery).  Sheath removal per protocol.  We will run IV nitroglycerin overnight treat possible spasm.  Dual antiplatelet therapy essentially with Plavix lifelong.      _____________    Disposition   Pt is being discharged home today in good condition.  Follow-up Plans & Appointments    Follow-up Information    Barrett, Evelene Croon, PA-C. Go on 06/08/2017.   Specialties:  Cardiology, Radiology Why:  Your follow-up appointment is at [place] at the [facility] location, please arrive 10-15 minutes early Contact information: [address] [place] [address] [phone]          Discharge Instructions    AMB Referral to Cardiac Rehabilitation - Phase II    Complete by:  As directed    Diagnosis:   Stable Angina Coronary Stents     Amb Referral to Cardiac Rehabilitation    Complete by:  As directed    Diagnosis:   Coronary Stents PTCA     Diet - low sodium heart healthy    Complete by:  As directed    Increase activity slowly    Complete by:  As directed    May shower / Bathe    Complete by:  As directed       Discharge Medications   Allergies as of 05/26/2017   No Known Allergies     Medication List    STOP taking these medications   isosorbide mononitrate 60 MG 24 hr tablet Commonly known as:  IMDUR   naproxen sodium 220 MG tablet Commonly known as:  ANAPROX     TAKE these medications   aspirin EC 81 MG tablet Take 81 mg by  mouth daily.   atorvastatin 40 MG tablet Commonly known as:  LIPITOR Take 1 tablet (40 mg total) by mouth daily. What changed:  when to take this   clopidogrel 75 MG tablet Commonly known as:  PLAVIX Take 1 tablet (75 mg total) by mouth daily with breakfast.   Fish Oil 1000 MG Caps Take 1,000 mg by mouth daily.   Lancets Misc 1 Device by Does not apply route 4 (four) times daily -  before meals and at bedtime. What changed:  when to take this  lisinopril 10 MG tablet Commonly known as:  PRINIVIL,ZESTRIL Take 1 tablet (10 mg total) by mouth daily.   metFORMIN 1000 MG tablet Commonly known as:  GLUCOPHAGE Take 1 tablet (1,000 mg total) by mouth 2 (two) times daily with a meal. Start taking on:  05/28/2017 What changed:  These instructions start on 05/28/2017. If you are unsure what to do until then, ask your doctor or other care provider. Notes to patient:  HOLD FOR 48 HRS   metoprolol succinate 100 MG 24 hr tablet Commonly known as:  TOPROL XL Take 1 tablet (100 mg total) by mouth daily. Take with or immediately following a meal.   nitroGLYCERIN 0.4 MG SL tablet Commonly known as:  NITROSTAT Place 1 tablet (0.4 mg total) under the tongue every 5 (five) minutes as needed for chest pain.   ranolazine 1000 MG SR tablet Commonly known as:  RANEXA Take 1 tablet (1,000 mg total) by mouth 2 (two) times daily.   sitaGLIPtin 100 MG tablet Commonly known as:  JANUVIA Take 1 tablet (100 mg total) by mouth daily.            Discharge Care Instructions        Start     Ordered   05/28/17 0000  metFORMIN (GLUCOPHAGE) 1000 MG tablet  2 times daily with meals     05/26/17 1100   05/27/17 0000  clopidogrel (PLAVIX) 75 MG tablet  Daily with breakfast     05/26/17 1100   05/26/17 0000  Amb Referral to Cardiac Rehabilitation    Question Answer Comment  Diagnosis: Coronary Stents   Diagnosis: PTCA      05/26/17 0858   05/26/17 0000  Increase activity slowly     05/26/17  1100   05/26/17 0000  May shower / Bathe     05/26/17 1100   05/26/17 0000  Diet - low sodium heart healthy     05/26/17 1100   05/25/17 0000  AMB Referral to Cardiac Rehabilitation - Phase II    Question Answer Comment  Diagnosis: Stable Angina   Diagnosis: Coronary Stents      05/25/17 1510       Aspirin prescribed at discharge?  Yes High Intensity Statin Prescribed? (Lipitor 40-80mg  or Crestor 20-40mg ): Yes Beta Blocker Prescribed? Yes For EF 45% or less, Was ACEI/ARB Prescribed? Yes ADP Receptor Inhibitor Prescribed? (i.e. Plavix etc.-Includes Medically Managed Patients): Yes For EF <45%, Aldosterone Inhibitor Prescribed? No: normal EG Was EF assessed during THIS hospitalization? Yes Was Cardiac Rehab II ordered? (Included Medically managed Patients): Yes   Outstanding Labs/Studies    Duration of Discharge Encounter   Greater than 30 minutes including physician time.  Signed, Linus Mako PA-C 05/26/2017, 11:35 AM  Personally seen and examined. Agree with above.  - DC  - DAPT  - cath reviewed  - meds reviewed.   - cardiac rehab  - 2-4 week follow up Dr. Oval Linsey or APP Candee Furbish, MD

## 2017-05-26 NOTE — Discharge Instructions (Signed)
Coronary Angiogram With Stent, Care After °This sheet gives you information about how to care for yourself after your procedure. Your health care provider may also give you more specific instructions. If you have problems or questions, contact your health care provider. °What can I expect after the procedure? °After your procedure, it is common to have: °· Bruising in the area where a small, thin tube (catheter) was inserted. This usually fades within 1-2 weeks. °· Blood collecting in the tissue (hematoma) that may be painful to the touch. It should usually decrease in size and tenderness within 1-2 weeks. ° °Follow these instructions at home: °Insertion area care °· Do not take baths, swim, or use a hot tub until your health care provider approves. °· You may shower 24-48 hours after the procedure or as directed by your health care provider. °· Follow instructions from your health care provider about how to take care of your incision. Make sure you: °? Wash your hands with soap and water before you change your bandage (dressing). If soap and water are not available, use hand sanitizer. °? Change your dressing as told by your health care provider. °? Leave stitches (sutures), skin glue, or adhesive strips in place. These skin closures may need to stay in place for 2 weeks or longer. If adhesive strip edges start to loosen and curl up, you may trim the loose edges. Do not remove adhesive strips completely unless your health care provider tells you to do that. °· Remove the bandage (dressing) and gently wash the catheter insertion site with plain soap and water. °· Pat the area dry with a clean towel. Do not rub the area, because that may cause bleeding. °· Do not apply powder or lotion to the incision area. °· Check your incision area every day for signs of infection. Check for: °? More redness, swelling, or pain. °? More fluid or blood. °? Warmth. °? Pus or a bad smell. °Activity °· Do not drive for 24 hours if you  were given a medicine to help you relax (sedative). °· Do not lift anything that is heavier than 10 lb (4.5 kg) for 5 days after your procedure or as directed by your health care provider. °· Ask your health care provider when it is okay for you: °? To return to work or school. °? To resume usual physical activities or sports. °? To resume sexual activity. °Eating and drinking °· Eat a heart-healthy diet. This should include plenty of fresh fruits and vegetables. °· Avoid the following types of food: °? Food that is high in salt. °? Canned or highly processed food. °? Food that is high in saturated fat or sugar. °? Fried food. °· Limit alcohol intake to no more than 1 drink a day for non-pregnant women and 2 drinks a day for men. One drink equals 12 oz of beer, 5 oz of wine, or 1½ oz of hard liquor. °Lifestyle °· Do not use any products that contain nicotine or tobacco, such as cigarettes and e-cigarettes. If you need help quitting, ask your health care provider. °· Take steps to manage and control your weight. °· Get regular exercise. °· Manage your blood pressure. °· Manage other health problems, such as diabetes. °General instructions °· Take over-the-counter and prescription medicines only as told by your health care provider. Blood thinners may be prescribed after your procedure to improve blood flow through the stent. °· If you need an MRI after your heart stent has been placed, be   sure to tell the health care provider who orders the MRI that you have a heart stent. °· Keep all follow-up visits as directed by your health care provider. This is important. °Contact a health care provider if: °· You have a fever. °· You have chills. °· You have increased bleeding from the catheter insertion area. Hold pressure on the area. °Get help right away if: °· You develop chest pain or shortness of breath. °· You feel faint or you pass out. °· You have unusual pain at the catheter insertion area. °· You have redness,  warmth, or swelling at the catheter insertion area. °· You have drainage (other than a small amount of blood on the dressing) from the catheter insertion area. °· The catheter insertion area is bleeding, and the bleeding does not stop after 30 minutes of holding steady pressure on the area. °· You develop bleeding from any other place, such as from your rectum. There may be bright red blood in your urine or stool, or it may appear as black, tarry stool. °This information is not intended to replace advice given to you by your health care provider. Make sure you discuss any questions you have with your health care provider. °Document Released: 04/09/2005 Document Revised: 06/17/2016 Document Reviewed: 06/17/2016 °Elsevier Interactive Patient Education © 2018 [employer] ° °

## 2017-05-26 NOTE — Progress Notes (Signed)
CARDIAC REHAB PHASE I   PRE:  Rate/Rhythm: 88 SR  BP:  Sitting: 123/75   MODE:  Ambulation: 600 ft   POST:  Rate/Rhythm: 94 SR  BP:  Sitting: 141/77         SaO2: 99 RA  Pt ambulated 600 ft on RA, independent, steady gait, tolerated well with no complaints. Completed PCI/stent education.  Reviewed risk factors, PCI book, anti-platelet therapy, stent card, activity restrictions, ntg, exercise, heart healthy and diabetes diet and phase 2 cardiac rehab. Pt verbalized understanding, receptive to education. Pt agrees to phase 2 cardiac rehab referral, will send to [facility] per pt request. Pt does not have insurance, case manager to follow up regarding any medication needs. Pt to edge of bed per pt request after walk, call bell within reach.     [place], RN, BSN 05/26/2017 8:59 AM

## 2017-05-26 NOTE — Care Management Note (Signed)
Case Management Note  Patient Details  Name: Jason Velasquez MRN: [id number] Date of Birth: 1955/10/05  Subjective/Objective:  From home with ex wife, pta indep, s/p coronary stent intervention, will be on plavix.  He will follow up with internal medicine clinic, he has an orange card for his medications. He states he gets meds from internal medicine clinic and the [facility].                     Action/Plan: NCM will follow for dc needs.  Expected Discharge Date:                  Expected Discharge Plan:  Home/Self Care  In-House Referral:     Discharge planning Services  CM Consult  Post Acute Care Choice:    Choice offered to:     DME Arranged:    DME Agency:     HH Arranged:    [facility] Agency:     Status of Service:  Completed, signed off  If discussed at [employer] of Stay Meetings, dates discussed:    Additional Comments:  Zenon Mayo, RN 05/26/2017, 9:25 AM

## 2017-05-31 ENCOUNTER — Ambulatory Visit (HOSPITAL_COMMUNITY): Payer: No Typology Code available for payment source

## 2017-06-01 ENCOUNTER — Other Ambulatory Visit: Payer: Self-pay

## 2017-06-01 ENCOUNTER — Ambulatory Visit (HOSPITAL_COMMUNITY): Payer: No Typology Code available for payment source | Attending: Cardiology

## 2017-06-01 DIAGNOSIS — I10 Essential (primary) hypertension: Secondary | ICD-10-CM | POA: Insufficient documentation

## 2017-06-01 DIAGNOSIS — E1151 Type 2 diabetes mellitus with diabetic peripheral angiopathy without gangrene: Secondary | ICD-10-CM | POA: Insufficient documentation

## 2017-06-01 DIAGNOSIS — E785 Hyperlipidemia, unspecified: Secondary | ICD-10-CM | POA: Insufficient documentation

## 2017-06-01 DIAGNOSIS — Z87891 Personal history of nicotine dependence: Secondary | ICD-10-CM | POA: Insufficient documentation

## 2017-06-01 DIAGNOSIS — I6529 Occlusion and stenosis of unspecified carotid artery: Secondary | ICD-10-CM | POA: Insufficient documentation

## 2017-06-01 DIAGNOSIS — I25110 Atherosclerotic heart disease of native coronary artery with unstable angina pectoris: Secondary | ICD-10-CM | POA: Insufficient documentation

## 2017-06-01 DIAGNOSIS — I200 Unstable angina: Secondary | ICD-10-CM

## 2017-06-01 MED ORDER — PERFLUTREN LIPID MICROSPHERE
1.0000 mL | INTRAVENOUS | Status: AC | PRN
  Administered 2017-06-01: 2 mL via INTRAVENOUS

## 2017-06-03 MED FILL — ATORVASTATIN 40 MG TABLET: 40 | 30 days supply | Qty: 30 | Fill #11

## 2017-06-08 ENCOUNTER — Ambulatory Visit (INDEPENDENT_AMBULATORY_CARE_PROVIDER_SITE_OTHER): Payer: No Typology Code available for payment source | Admitting: Physician Assistant

## 2017-06-08 ENCOUNTER — Other Ambulatory Visit: Payer: Self-pay | Admitting: Physician Assistant

## 2017-06-08 ENCOUNTER — Encounter: Payer: Self-pay | Admitting: Physician Assistant

## 2017-06-08 VITALS — BP 142/80 | HR 78 | Ht 70.5 in | Wt 210.2 lb

## 2017-06-08 DIAGNOSIS — I25709 Atherosclerosis of coronary artery bypass graft(s), unspecified, with unspecified angina pectoris: Secondary | ICD-10-CM

## 2017-06-08 DIAGNOSIS — I739 Peripheral vascular disease, unspecified: Secondary | ICD-10-CM

## 2017-06-08 DIAGNOSIS — I10 Essential (primary) hypertension: Secondary | ICD-10-CM

## 2017-06-08 MED ORDER — RANOLAZINE ER 1000 MG PO TB12: 1000.0000 mg | ORAL_TABLET | Freq: Two times a day (BID) | ORAL | 3 refills | Status: DC

## 2017-06-08 NOTE — Patient Instructions (Signed)
Medication Instructions:  NO CHANGES Your physician recommends that you continue on your current medications as directed. Please refer to the Current Medication list given to you today. If you need a refill on your cardiac medications before your next appointment, please call your pharmacy.  Follow-Up: Your physician wants you to keep follow-up appt scheduled with Dr Oval Linsey 07-18-17 @ 1140am.   Thank you for choosing [facility] at [facility]!!

## 2017-06-08 NOTE — Progress Notes (Signed)
Cardiology Office Note   Date:  06/08/2017   ID:  Jason Velasquez, DOB 1955/11/07, MRN [id number]  PCP:  [name], DO  Cardiologist:  Dr. Oval Linsey, 05/18/2017  [name], PA-C   Chief Complaint  Patient presents with  . Hospitalization Follow-up    3 stents placed, pt denied chest pain    History of Present Illness: Witten Certain is a 61 y.o. male with a history of CABG in 2007, chronic stable angina, PAD, hypertension, diabetes and CVA.  He was seen in the office on 08/15 and catheterization plus echocardiogram recommended. Cardiac catheterization was followed by outpatient staged intervention with drug-eluting stent from the distal left main into the proximal circumflex-OM 1, mid circumflex treated with a stent and ostial LAD treated with cutting balloon angioplasty, EF normal at cath and by echo  Jason Velasquez presents for Cardiology follow-up.  He has not had any chest pain since discharge from the hospital. He is trying to increase his activity with walking. He is up to between 10 and 15 minutes. He gets leg pain when he walks and that is the limiting factor. However, he has been able to increase his ambulation. He is not having any exertional symptoms.   He is compliant with his medications but is confused because he has 3 different prescriptions for isosorbide and wonders what he is supposed to do with them. He needs a new prescription for Ranexa.  He denies any new dyspnea on exertion, orthopnea or PND. No LE edema. No chest pain or SOB w/ exertion. No side effects from the meds.  Reviewed Dr Kennon Holter recommendations on getting aortoiliac Dopplers and LEAs, he prefers to wait on further testing for now, will discuss with Dr Oval Linsey.   Past Medical History:  Diagnosis Date  . Angina, class III ([place]) 08/24/2015  . CAD (coronary artery disease) of artery bypass graft 06/25/2014   CABG- 2007   . CAD (coronary artery disease), native coronary artery 06/25/2014   CABG-  2007   . Carotid stenosis 03/03/2016   47-42% RICA, 5-95% LICA, >63% RECA stenosis Repeat 12/2016.  . Claudication ([place])   . Controlled type 2 diabetes mellitus without complication ([place]) 05/10/5642   Dx March of 2016. GAD65 negative Anti Islet cell negative    . DKA (diabetic ketoacidoses) ([place]) 12/07/2014  . Dyspnea   . History of CVA (cerebrovascular accident) 07/12/2014  . Hx of adenomatous colonic polyps 05/27/2015  . Hypercholesterolemia   . Hypertension   . Hypertensive heart disease 03/03/2016  . S/P CABG (coronary artery bypass graft) 03/28/2015   2007 at [facility]   . Stroke ([place])    2015  . Tubular adenoma of colon 05/31/2015   Colonoscopy 05/20/15 with [name]: 3 tubullar adenoma's largest 2.3cm.  Plan for repeat Colonoscopy in 2019.    Past Surgical History:  Procedure Laterality Date  . CARDIAC CATHETERIZATION N/A 08/26/2015   Procedure: Left Heart Cath and Cors/Grafts Angiography;  Surgeon: [name], MD;  Location: [facility];  Service: Cardiovascular;  Laterality: N/A;  . CARDIAC SURGERY    . CORONARY ARTERY BYPASS GRAFT    . CORONARY BALLOON ANGIOPLASTY N/A 05/25/2017   Procedure: CORONARY BALLOON ANGIOPLASTY;  Surgeon: [name], MD;  Location: [facility];  Service: Cardiovascular;  Laterality: N/A;  . CORONARY STENT INTERVENTION N/A 05/25/2017   Procedure: CORONARY STENT INTERVENTION;  Surgeon: [name], MD;  Location: [facility];  Service: Cardiovascular;  Laterality: N/A;  . LEFT HEART CATH AND CORS/GRAFTS ANGIOGRAPHY N/A 05/23/2017   Procedure: LEFT HEART CATH AND CORS/GRAFTS ANGIOGRAPHY;  Surgeon: [name], MD;  Location: [facility];  Service: Cardiovascular;  Laterality: N/A;    Medication Sig  . aspirin EC 81 MG tablet Take 81 mg by mouth daily.  Marland Kitchen atorvastatin (LIPITOR) 40 MG tablet Take 1 tablet (40 mg total) by mouth daily. (Patient taking differently: Take 40 mg by mouth every evening. )   . clopidogrel (PLAVIX) 75 MG tablet Take 1 tablet (75 mg total) by mouth daily with breakfast.  . Lancets MISC 1 Device by Does not apply route 4 (four) times daily -  before meals and at bedtime. (Patient taking differently: 1 Device by Does not apply route 2 (two) times daily. )  . lisinopril (PRINIVIL,ZESTRIL) 10 MG tablet Take 1 tablet (10 mg total) by mouth daily.  . metFORMIN (GLUCOPHAGE) 1000 MG tablet Take 1 tablet (1,000 mg total) by mouth 2 (two) times daily with a meal.  . metoprolol succinate (TOPROL XL) 100 MG 24 hr tablet Take 1 tablet (100 mg total) by mouth daily. Take with or immediately following a meal.  . nitroGLYCERIN (NITROSTAT) 0.4 MG SL tablet Place 1 tablet (0.4 mg total) under the tongue every 5 (five) minutes as needed for chest pain.  . Omega-3 Fatty Acids (FISH OIL) 1000 MG CAPS Take 1,000 mg by mouth daily.   . ranolazine (RANEXA) 1000 MG SR tablet Take 1 tablet (1,000 mg total) by mouth 2 (two) times daily.  . sitaGLIPtin (JANUVIA) 100 MG tablet Take 1 tablet (100 mg total) by mouth daily.   No current facility-administered medications for this visit.     Allergies:   Patient has no known allergies.    Social History:  The patient  reports that he quit smoking about 7 years ago. He has never used smokeless tobacco. He reports that he does not drink alcohol or use drugs.   Family History:  The patient's family history includes Diabetes in his father; Hypertension in his mother and sister.    ROS:  Please see the history of present illness. All other systems are reviewed and negative.    PHYSICAL EXAM: VS:  BP (!) 142/80   Pulse 78   Ht 5' 10.5" (1.791 m)   Wt 210 lb 3.2 oz (95.3 kg)   BMI 29.73 kg/m  , BMI Body mass index is 29.73 kg/m. GEN: Well nourished, well developed, male in no acute distress  HEENT: normal for age  Neck: no JVD, no carotid bruit, no masses Cardiac: RRR; soft murmur, no rubs, or gallops Respiratory:  clear to auscultation  bilaterally, normal work of breathing GI: soft, nontender, nondistended, + BS MS: no deformity or atrophy; no edema; distal pulses are 2+ in upper extremities, decreased but palpable in the lower extrem Skin: warm and dry, no rash Neuro:  Strength and sensation are intact Psych: euthymic mood, full affect   EKG:  EKG is ordered today. The ekg ordered today demonstrates SR, no acute ischemic changes, normal intervals  Left Heart Cath and Cors/Graft Angiography 05/23/2017   Ost RCA lesion, 40 %stenosed. Prox & mid RCA lesions, 90 %stenosed. Dist RCA lesion, 100 %stenosed.  SVG-RCA Origin lesion, 100 %stenosed. - Unable to engage  SVG-OM proximal 100% occluded  Mid Cx lesion, 95 %stenosed. 3rd Mrg lesion, 100 %stenosed.  Ost LAD to Mid LAD lesion, 5 %stenosed. Extensively aneurysmal poststenotic dilation with 2 diagonal  branches  Mid LAD lesion, 100 %stenosed.  LIMA-LAD is normal in caliber. Prox Graft lesion, 50 %stenosed. - Distal LAD is a wraparound LAD with faint left to right collaterals  Origin to Prox Graft lesion, 100 %stenosed.  LM lesion, 95 %stenosed. Ost Cx lesion, 95 %stenosed. Ost LAD lesion, 85 %stenosed.  Prox Cx lesion, 90 %stenosed at 1st Mrg. Ost 1st Mrg to 1st Mrg lesion, 50 %stenosed. (visible stump of SVG)  LV end diastolic pressure is mildly elevated.  The left ventricular systolic function is normal.  The left ventricular ejection fraction is 55-65% by visual estimate.   Diagnostic Diagram        Coronary Balloon Angioplasty and Coronary Stent Intervention 05/25/17   Lesion #3: DLM lesion, 90 %stenosed.Ost Cx lesion, 95 %stenosed. Prox Cx lesion, 90 %stenosed. --> covered with 1 stent.  A STENT SYNERGY DES 3X16 drug eluting stent was successfully placed. Postdilated in the left main to 3.6 mm and in the proximal circumflex up to 3.4 mm.  Post intervention, there is a 0% residual stenosis.  Lesion #4 Ost LAD lesion, 85 %stenosed.  Post  intervention, Wolverine Cutting Balloon PTCA only (through the left main-circumflex stent there is a 30% residual stenosis.  Lesion #2: Ost 1st Mrg to 1st Mrg lesion, 75 %stenosed.  A STENT SYNERGY DES 2.25X20 drug eluting stent was successfully placed. -With the proximal portion stented through. -  Post intervention, there is a 0% residual stenosis - in the majority a stent, with the ostial portion as roughly 50% stenosis.  Lesion 1: Mid Cx lesion, 95 %stenosed.  A STENT SYNERGY DES 2.25X16 drug eluting stent was successfully placed.  Post intervention, there is a 0% residual stenosis.  3rd Mrg lesion, 100 %stenosed.  Mid LAD lesion, 100 %stenosed.  Successful multisite PCI of essentially bifurcation distal left main-ostial proximal circumflex and ostial LAD treated with a stent from the distal left main into proximal circumflex crossing OM1. The stent was placed in OM1 to the graft insertion site. The mid cervix is also treated with a third stent. The ostial LAD was treated with cutting balloon reducing the 85% lesion down to roughly 30%.   Post-Intervention Diagram        ECHO: 06/01/2017 - Procedure narrative: Transthoracic echocardiography. Image   quality was poor. Intravenous contrast (Definity) was   administered. - Left ventricle: The cavity size was normal. There was mild focal   basal hypertrophy of the septum. Systolic function was vigorous.   The estimated ejection fraction was in the range of 65% to 70%.   Wall motion was normal; there were no regional wall motion   abnormalities. Doppler parameters are consistent with abnormal   left ventricular relaxation (grade 1 diastolic dysfunction). - Aortic valve: Trileaflet; mildly thickened, mildly calcified leaflets. - Left atrium: The atrium was mildly dilated.  Recent Labs: 05/26/2017: BUN 9; Creatinine, Ser 0.94; Hemoglobin 10.0; Platelets 254; Potassium 4.0; Sodium 136    Lipid Panel    Component Value  Date/Time   CHOL 121 (L) 03/03/2016 1503   CHOL 129 07/10/2015 1418   TRIG 320 (H) 03/03/2016 1503   HDL 21 (L) 03/03/2016 1503   HDL 21 (L) 07/10/2015 1418   CHOLHDL 5.8 (H) 03/03/2016 1503   VLDL 64 (H) 03/03/2016 1503   LDLCALC 36 03/03/2016 1503   LDLCALC 45 07/10/2015 1418     Wt Readings from Last 3 Encounters:  06/08/17 210 lb 3.2 oz (95.3 kg)  05/26/17 212 lb 8.4 oz (96.4  kg)  05/23/17 211 lb (95.7 kg)     Other studies Reviewed: Additional studies/ records that were reviewed today include: Office notes, hospital records and testing.  ASSESSMENT AND PLAN:  1.  Unstable anginal pain: His symptoms are significantly improved since his recent cath and percutaneous intervention. He is to continue aspirin, Plavix, Lipitor, beta blocker and ACE inhibitor. He is encouraged to increase his activity as his lower extremities will tolerate. I advised that the Ranexa and the isosorbide treat symptoms, not conditions. He wishes to continue the Ranexa but is okay being off the isosorbide for now. He understands that if he starts getting regular chest pain, he can call us and we can restart the isosorbide.  2. PAD: Dr. Gwenlyn Found recommended some further testing but the patient is reluctant to have this done right now. The reasons are partly financial. He wishes to fully recover from his PCI and and get "straightened out" before he does any further testing on his legs. He is encouraged to walk until his legs started hurting, then stop until they feel better and then start walking again.  3. Hypertension: His blood pressure is well controlled on current medical therapy   Current medicines are reviewed at length with the patient today.  The patient has concerns regarding medicines. Concerns were addressed  The following changes have been made:  no change  Labs/ tests ordered today include:  No orders of the defined types were placed in this encounter.    Disposition:   FU with Dr. Oval Linsey in  October as scheduled  SignedLenoard Aden  06/08/2017 9:15 AM    [place] Phone: [phone]; Fax: [phone]  This note was written with the assistance of speech recognition software. Please excuse any transcriptional errors.

## 2017-06-10 ENCOUNTER — Telehealth: Payer: Self-pay | Admitting: Cardiology

## 2017-06-10 NOTE — Telephone Encounter (Signed)
Pt s needs a medical release to go back to work. He needs this asap please. If he is not at this phone number,please call [phone].

## 2017-06-10 NOTE — Telephone Encounter (Signed)
Still waiting for Dr Oval Linsey

## 2017-06-10 NOTE — Telephone Encounter (Signed)
S/w pt he states that his work is not accepting his back to work from [employer], [employer]. He states that he needs a letter from a doctor. Speak with Ivar Bury (he is not in)or Ben at [employer] [phone]. S/w Suezanne Jacquet he states that he is not sure either what the back to work note needs to say either, but he thinks that it just needs to say return to work with no restrictions. Letter given to Dr Blenda Mounts nurse for signature.

## 2017-06-14 NOTE — Telephone Encounter (Signed)
Rhonda PA reviewed patients chart, she saw patient 06/08/17. Patient ok to return to work without restrictions but may continue to have symptoms. Meredeth Ide LPN spoke with patient and advised to monitor symptoms and call office if any problems. 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

## 2017-06-21 MED FILL — JANUVIA 100 MG TABLET: 100 | 30 days supply | Qty: 30 | Fill #10

## 2017-06-21 MED FILL — METOPROLOL SUCC ER 100 MG T: 100 | 30 days supply | Qty: 30 | Fill #10

## 2017-06-30 ENCOUNTER — Other Ambulatory Visit: Payer: Self-pay | Admitting: *Deleted

## 2017-06-30 MED FILL — LISINOPRIL 10 MG TABS: 10 | 90 days supply | Qty: 90 | Fill #3

## 2017-06-30 NOTE — Telephone Encounter (Signed)
Received "Advance refill request" from pt's pharmacy-pt just received the last refill for this prescription, but pharmacy is requesting additional refills to ensure uninterrupted availability of this prescription.  PCP currently out of the office, but pt has plenty of medication to last until pcp returns.Jason Hidden Cassady9/27/201812:23 PM

## 2017-07-04 MED ORDER — ATORVASTATIN CALCIUM 40 MG PO TABS: 40.0000 mg | ORAL_TABLET | Freq: Every evening | ORAL | 11 refills | Status: DC

## 2017-07-05 NOTE — Telephone Encounter (Signed)
[place] to have them trasnferr rx from [facility]).[name]10/2/20183:07 PM

## 2017-07-14 ENCOUNTER — Ambulatory Visit: Payer: No Typology Code available for payment source

## 2017-07-16 NOTE — Progress Notes (Signed)
Cardiology Office Note   Date:  07/18/2017   ID:  Jason Velasquez, DOB 1956-01-23, MRN [id number]  PCP:  [name], DO  Cardiologist:   Skeet Latch, MD   Chief Complaint  Patient presents with  . Follow-up    2 months      Patient ID: Jason Velasquez is a 61 y.o. male with CAD s/p CABG, chronic stable angina, PAD, hypertension, diabetes and CVA who presents for follow up.  [name] was first evaluated for chest pain on 07/27/15.  He previously underwent CABG in 2007. He had a nuclear stress test on 08/21/15 revealed LVEF 57% with moderate ischemia in the inferoseptal, inferior, and anterior locations. He subsequently underwent left heart catheterization on 08/26/15 that revealed severe three vessel disease including a 90% left main lesion and an occluded RCA and LAD. His LIMA to the LAD was patent but all other vein grafts were occluded. There were no optimal PCI targets.  He was started on carvedilol and Ranexa. [name] also underwent ABI testing 08/2015 that revealed diffuse atherosclerosis from the proximal abdominal aorta through the common and external iliac arteries bilaterally. There was no detectable flow in the L peroneal or anterior tibial arteries. ABIs were near 0.8 bilaterally. He was referred to Dr. Gwenlyn Found where medical management was advised. He was noted to have carotid bruits and was referred for carotid ultrasound on 12/09/15.  This revealed 40-59% stenosis on the right and 1-39% stenosis on the left.  He had repeat LE Dopplers and ABIs 03/2017 that were unchanged from prior.  Medical management was recommended.    At his last appointment Mr. Maselli continued to have worsening angina despite optimal medical therapy.  He was referred for [facility] where he was found to have progression of the distal LM/LAD/LCx lesion.  He underwent DES placement in the ostial LCx, mid LCx, and  ostial LAD.  The ostial LAD was treated with a cutting ballpon reducing the stenosis from 85% to  30%.  It was recommended that he continue clopidogrel and aspirin indefinitely.  He had an echo 06/01/17 that showed LVEF 65-70% with grade 1 diastolic dysfunction.  He followed up with [name] 06/08/17 and was doing well.  He had no chest pain but did have claudication.  Imdur was discontinued.  Since having stents placed he has been doing well until two days ago.  He started noticing chest pressure when laying down. The first night it improved after taking Tylenol PM.The second night he had to get up and sleep in his recliner. He denies orthopnea or lower extremity edema. He has not had any exertional dyspnea or chest pain. He started back exercising by walking for 15-40 minutes daily. He continues to have pain in bilateral lower extremities when walking.  He denies GERD but has experienced increased gas lately.  Prior to the stent placement he had chest pain both lying down and with exertion.  He was getting Ranexa through a patient assistance program and his last prescription ran out 3 weeks ago.    Past Medical History:  Diagnosis Date  . Angina, class III ([name]) 08/24/2015  . CAD (coronary artery disease) of artery bypass graft 06/25/2014   CABG- 2007   . CAD (coronary artery disease), native coronary artery 06/25/2014   CABG- 2007   . Carotid stenosis 03/03/2016   44-03% RICA, 4-74% LICA, >25% RECA stenosis Repeat 12/2016.  . Claudication ([place])   . Controlled type 2 diabetes mellitus without complication (  [name]) 12/07/2014   Dx March of 2016. GAD65 negative Anti Islet cell negative    . DKA (diabetic ketoacidoses) ([place]) 12/07/2014  . Dyspnea   . History of CVA (cerebrovascular accident) 07/12/2014  . Hx of adenomatous colonic polyps 05/27/2015  . Hypercholesterolemia   . Hypertension   . Hypertensive heart disease 03/03/2016  . S/P CABG (coronary artery bypass graft) 03/28/2015   2007 at [place] [facility]   . Stroke ([place])    2015  . Tubular adenoma of colon 05/31/2015   Colonoscopy  05/20/15 with [name]: 3 tubullar adenoma's largest 2.3cm.  Plan for repeat Colonoscopy in 2019.    Past Surgical History:  Procedure Laterality Date  . CARDIAC CATHETERIZATION N/A 08/26/2015   Procedure: Left Heart Cath and Cors/Grafts Angiography;  Surgeon: [name], MD;  Location: [facility];  Service: Cardiovascular;  Laterality: N/A;  . CARDIAC SURGERY    . CORONARY ARTERY BYPASS GRAFT    . CORONARY BALLOON ANGIOPLASTY N/A 05/25/2017   Procedure: CORONARY BALLOON ANGIOPLASTY;  Surgeon: [name], MD;  Location: [facility];  Service: Cardiovascular;  Laterality: N/A;  . CORONARY STENT INTERVENTION N/A 05/25/2017   Procedure: CORONARY STENT INTERVENTION;  Surgeon: [name], MD;  Location: [facility];  Service: Cardiovascular;  Laterality: N/A;  . LEFT HEART CATH AND CORS/GRAFTS ANGIOGRAPHY N/A 05/23/2017   Procedure: LEFT HEART CATH AND CORS/GRAFTS ANGIOGRAPHY;  Surgeon: [name], MD;  Location: [facility];  Service: Cardiovascular;  Laterality: N/A;    Current Outpatient Prescriptions  Medication Sig Dispense Refill  . aspirin EC 81 MG tablet Take 81 mg by mouth daily.    Marland Kitchen atorvastatin (LIPITOR) 40 MG tablet Take 1 tablet (40 mg total) by mouth every evening. 30 tablet 11  . clopidogrel (PLAVIX) 75 MG tablet Take 1 tablet (75 mg total) by mouth daily with breakfast. 30 tablet 11  . Lancets MISC 1 Device by Does not apply route 4 (four) times daily -  before meals and at bedtime. (Patient taking differently: 1 Device by Does not apply route 2 (two) times daily. ) 100 each 11  . lisinopril (PRINIVIL,ZESTRIL) 10 MG tablet Take 1 tablet (10 mg total) by mouth daily. 90 tablet 3  . metFORMIN (GLUCOPHAGE) 1000 MG tablet Take 1 tablet (1,000 mg total) by mouth 2 (two) times daily with a meal. 60 tablet 11  . metoprolol succinate (TOPROL XL) 100 MG 24 hr tablet Take 1 tablet (100 mg total) by mouth daily. Take with or immediately  following a meal. 30 tablet 11  . nitroGLYCERIN (NITROSTAT) 0.4 MG SL tablet Place 1 tablet (0.4 mg total) under the tongue every 5 (five) minutes as needed for chest pain. 25 tablet 5  . Omega-3 Fatty Acids (FISH OIL) 1000 MG CAPS Take 1,000 mg by mouth daily.     . sitaGLIPtin (JANUVIA) 100 MG tablet Take 1 tablet (100 mg total) by mouth daily. 30 tablet 11  . isosorbide mononitrate (IMDUR) 60 MG 24 hr tablet Take 1 tablet (60 mg total) by mouth daily. 30 tablet 5   No current facility-administered medications for this visit.     Allergies:   Patient has no known allergies.    Social History:  The patient  reports that he quit smoking about 7 years ago. He has never used smokeless tobacco. He reports that he does not drink alcohol or use drugs.   Family History:  The patient's family  history includes Diabetes in his father; Hypertension in his mother and sister.    ROS:  Please see the history of present illness.   Otherwise, review of systems are positive for none.   All other systems are reviewed and negative.    PHYSICAL EXAM: VS:  BP 114/60   Pulse 74   Ht 5\' 10"  (1.778 m)   Wt 95.7 kg (211 lb)   BMI 30.28 kg/m  , BMI Body mass index is 30.28 kg/m. GENERAL:  Well appearing.  No acute distress HEENT:  Pupils equal round and reactive, fundi not visualized, oral mucosa unremarkable NECK:  No jugular venous distention, waveform within normal limits, carotid upstroke brisk and symmetric, no carotid bruit LUNGS:  Clear to auscultation bilaterally.   HEART:  RRR.  PMI not displaced or sustained,S1 and S2 within normal limits, no S3, no S4, no clicks, no rubs, no murmurs ABD:  Flat, positive bowel sounds normal in frequency in pitch, no bruits, no rebound, no guarding, no midline pulsatile mass, no hepatomegaly, no splenomegaly EXT:  1+ R PT.  Absent DP.  Absent L PT or DP.  No edema, no cyanosis no clubbing.  2+ radial pulses.  SKIN:  No rashes no nodules.  Lack of hair in lower  tibia NEURO:  Cranial nerves II through XII grossly intact, motor grossly intact throughout PSYCH:  Cognitively intact, oriented to person place and time   EKG:  EKG is ordered today. The ekg ordered today demonstrates sinus rhythm rate 82 bpm.  Prior inferior infarct.  07/02/16: Sinus rhythm rate 75 bpm.  Prior inferior infarct  12/29/16: Sinus rhythm.  Incomplete RBBB.  Prior inferior infarct.  05/18/17: Sinus rhythm. Rate 87 bpm. B 1 PVCs. 07/18/17: Sinus rhythm.  Rate 71 bpm.    LHC 05/25/17:  Lesion #3: DLM lesion, 90 %stenosed.Ost Cx lesion, 95 %stenosed. Prox Cx lesion, 90 %stenosed. --> covered with 1 stent.  A STENT SYNERGY DES 3X16 drug eluting stent was successfully placed. Postdilated in the left main to 3.6 mm and in the proximal circumflex up to 3.4 mm.  Post intervention, there is a 0% residual stenosis.  Lesion #4 Ost LAD lesion, 85 %stenosed.  Post intervention, Wolverine Cutting Balloon PTCA only (through the left main-circumflex stent there is a 30% residual stenosis.  Lesion #2: Ost 1st Mrg to 1st Mrg lesion, 75 %stenosed.  A STENT SYNERGY DES 2.25X20 drug eluting stent was successfully placed. -With the proximal portion stented through. -  Post intervention, there is a 0% residual stenosis - in the majority a stent, with the ostial portion as roughly 50% stenosis.  Lesion 1: Mid Cx lesion, 95 %stenosed.  A STENT SYNERGY DES 2.25X16 drug eluting stent was successfully placed.  Post intervention, there is a 0% residual stenosis.  3rd Mrg lesion, 100 %stenosed.  Mid LAD lesion, 100 %stenosed.   Successful multisite PCI of essentially bifurcation distal left main-ostial proximal circumflex and ostial LAD treated with a stent from the distal left main into proximal circumflex crossing OM1. The stent was placed in OM1 to the graft insertion site. The mid cervix is also treated with a third stent. The ostial LAD was treated with cutting balloon reducing the 85%  lesion down to roughly 30%.  Echo 06/01/17: Study Conclusions  - Procedure narrative: Transthoracic echocardiography. Image   quality was poor. Intravenous contrast (Definity) was   administered. - Left ventricle: The cavity size was normal. There was mild focal   basal hypertrophy of the  septum. Systolic function was vigorous.   The estimated ejection fraction was in the range of 65% to 70%.   Wall motion was normal; there were no regional wall motion   abnormalities. Doppler parameters are consistent with abnormal   left ventricular relaxation (grade 1 diastolic dysfunction). - Aortic valve: Trileaflet; mildly thickened, mildly calcified   leaflets. - Left atrium: The atrium was mildly dilated.   Lexiscan Cardiolite 08/21/15:  Nuclear stress EF: 57%.  The left ventricular ejection fraction is normal (55-65%).  There was no ST segment deviation noted during stress.  No T wave inversion was noted during stress.  Defect 1: There is a small defect of moderate severity present in the basal inferoseptal and basal inferior location.  Defect 2: There is a defect present in the basal anterior, mid anterior and apical anterior location.  Findings consistent with ischemia.  This is a high risk study.  High risk stress nuclear study with two areas of ischemia and normal left ventricular regional and global systolic function. The inferior area of ischemia is small. The anterior area of ischemia is extensive, but appears to be very mild. The septum is spared.  Carotid Doppler 12/11/15: 29-93% RICA, 7-16% LICA, >96% RECA stenosis  ABI 08/12/15: R 0.81, L 0.89 Diffuse atherosclerosis from the proximal abdominal aorta into both tibial arteries. Several areas of narrowing are noted in both lower extremities, although no focal significant stenosis is identified. One vessel run-off on the right, and three vessel run-off on the left.  ABI 02/03/17: R 0.81, L 0.91 Abnormal lower arterial  Doppler study, with waveforms suggestive of inflow disease. The bilateral ABI's are stable and in the mild range. The right great toe pressure is normal. The left great toe pressure is abnormal   Recent Labs: 05/26/2017: BUN 9; Creatinine, Ser 0.94; Hemoglobin 10.0; Platelets 254; Potassium 4.0; Sodium 136    Lipid Panel    Component Value Date/Time   CHOL 121 (L) 03/03/2016 1503   CHOL 129 07/10/2015 1418   TRIG 320 (H) 03/03/2016 1503   HDL 21 (L) 03/03/2016 1503   HDL 21 (L) 07/10/2015 1418   CHOLHDL 5.8 (H) 03/03/2016 1503   VLDL 64 (H) 03/03/2016 1503   LDLCALC 36 03/03/2016 1503   LDLCALC 45 07/10/2015 1418      Wt Readings from Last 3 Encounters:  07/18/17 95.7 kg (211 lb)  06/08/17 95.3 kg (210 lb 3.2 oz)  05/26/17 96.4 kg (212 lb 8.4 oz)      ASSESSMENT AND PLAN:  # CAD s/p CABG:  # Angina: Mr. Sowles has severe 3 vessel CAD and all his CABG grafts are occluded with the exception of his LIMA to the LAD.  He underwent successful PCI of the LM, LCx and OM1.  He ran out of Ranexa and is now having more chest pain. It is not exertional, so it is unclear if this was related to angina or possibly GERD.  We will sign his assistance form for Ranexa.  For now we will restart Imdur 60mg .  Continue aspirin and clopidogrel indefinitely. Continue atorvastatin and metoprolol.  Check troponin.   # Hypertension: Controlled.  Continue lisinopril, metoprolol, and Imdur as above.   # Hyperlipidemia: He is due for repeat lipids at follow up.  Continue atorvastatin and fish oil.   # Claudication: ABIs 0.8 bilaterally and medical management was recommended in 2016.  Unchanged on repeat.  Encouraged ambulation. Repeat ABIs in 1 year.  Continue aspirin and statin. Recommended that he  follow-up with his PCP to see if this could be sciatica.  # Carotid stenosis: Carotid Doppler revealed 50-49% R ICA stenosis and 1-39% stenosis on the left.  Continue aspirin and atorvastatin.   Current  medicines are reviewed at length with the patient today.  The patient does not have concerns regarding medicines.  The following changes have been made:  Restart Imdur 60mg  daily.  Labs/ tests ordered today include:   Orders Placed This Encounter  Procedures  . Troponin I     Disposition:   FU with Micky Overturf C. Oval Linsey, MD in 2 weeks.   Signed, Skeet Latch, MD  07/18/2017 1:41 PM    [place]

## 2017-07-18 ENCOUNTER — Encounter: Payer: Self-pay | Admitting: Cardiovascular Disease

## 2017-07-18 ENCOUNTER — Ambulatory Visit (INDEPENDENT_AMBULATORY_CARE_PROVIDER_SITE_OTHER): Payer: No Typology Code available for payment source | Admitting: Cardiovascular Disease

## 2017-07-18 VITALS — BP 114/60 | HR 74 | Ht 70.0 in | Wt 211.0 lb

## 2017-07-18 DIAGNOSIS — I10 Essential (primary) hypertension: Secondary | ICD-10-CM

## 2017-07-18 DIAGNOSIS — I739 Peripheral vascular disease, unspecified: Secondary | ICD-10-CM

## 2017-07-18 DIAGNOSIS — I25709 Atherosclerosis of coronary artery bypass graft(s), unspecified, with unspecified angina pectoris: Secondary | ICD-10-CM

## 2017-07-18 DIAGNOSIS — I200 Unstable angina: Secondary | ICD-10-CM

## 2017-07-18 DIAGNOSIS — E7800 Pure hypercholesterolemia, unspecified: Secondary | ICD-10-CM

## 2017-07-18 DIAGNOSIS — R079 Chest pain, unspecified: Secondary | ICD-10-CM

## 2017-07-18 DIAGNOSIS — Z955 Presence of coronary angioplasty implant and graft: Secondary | ICD-10-CM

## 2017-07-18 MED ORDER — ISOSORBIDE MONONITRATE ER 60 MG PO TB24: 60.0000 mg | ORAL_TABLET | Freq: Every day | ORAL | 5 refills | Status: DC

## 2017-07-18 NOTE — Addendum Note (Signed)
Addended by: Alvina Filbert B on: 07/18/2017 05:21 PM   Modules accepted: Orders

## 2017-07-18 NOTE — Patient Instructions (Addendum)
Medication Instructions:  2 WEEKS OF RANEXA GIVEN TODAY WHEN YOU FINISH START ISOSORBIDE 60 MG DAILY  ONCE YOU RECEIVE RANEXA WILL RESUME AND STOP ISOSORBIDE   Labwork: TROPONIN LEVEL TOADY   Testing/Procedures: NONE  Follow-Up: Your physician recommends that you schedule a follow-up appointment in: 2 WEEKS 08/01/17 AT 11:40  If you need a refill on your cardiac medications before your next appointment, please call your pharmacy.

## 2017-07-18 NOTE — Addendum Note (Signed)
Addended by: Alvina Filbert B on: 07/18/2017 04:17 PM   Modules accepted: Orders

## 2017-07-19 LAB — TROPONIN I: Troponin I: 0.03 ng/mL (ref 0.00–0.04)

## 2017-07-21 MED FILL — JANUVIA 100 MG TABLET: 100 | 30 days supply | Qty: 30 | Fill #11

## 2017-07-21 MED FILL — METOPROLOL SUCC ER 100 MG T: 100 | 30 days supply | Qty: 30 | Fill #11

## 2017-07-27 ENCOUNTER — Other Ambulatory Visit: Payer: Self-pay | Admitting: *Deleted

## 2017-07-27 DIAGNOSIS — I6523 Occlusion and stenosis of bilateral carotid arteries: Secondary | ICD-10-CM

## 2017-07-31 IMAGING — CT CT HEAD W/O CM
2 series · 15 of 30 positions shown, 19 images · non-contrast
Comparison: Head MRI and CT 07/04/2014

CLINICAL DATA: Acute visual changes with flashing white lights in
the left eye last week. Symptoms have since resolved. Concern for
TIA or stroke.

EXAM:
CT HEAD WITHOUT CONTRAST
TECHNIQUE: Contiguous axial images were obtained from the base of the skull
through the vertex without intravenous contrast.

[Series 201: head w/o, idose (1) · axial · non-contrast · 0.49mm/px · z∈[+144,+274]mm · 13 of 32 slices shown, 17 images]
[im 3/32  brain]
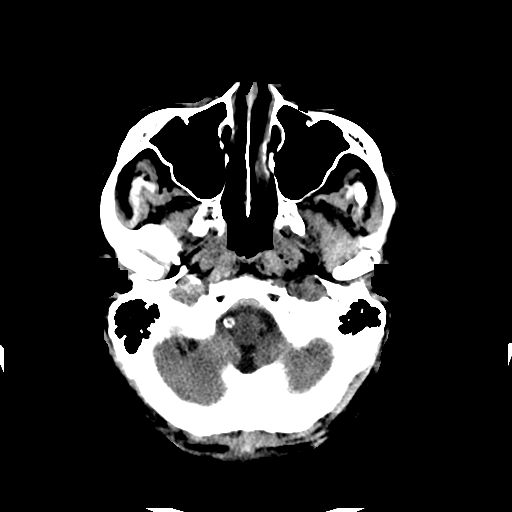
[im 3/32  bone]
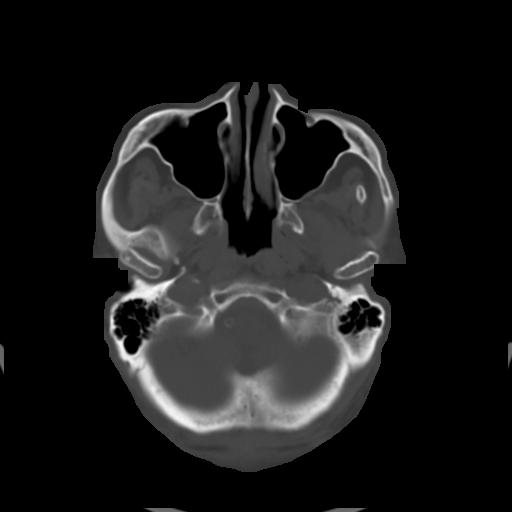
[im 5/32  brain]
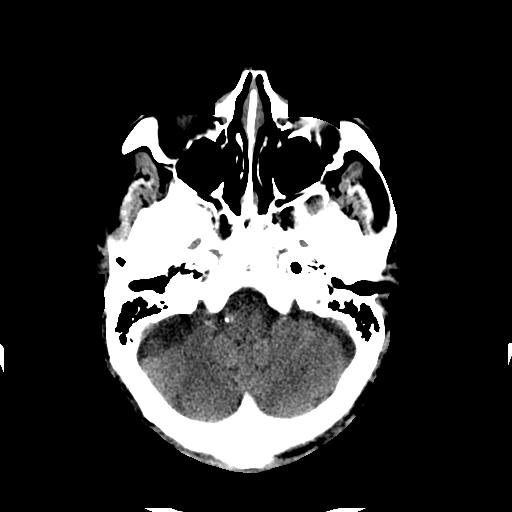
[im 7/32  brain]
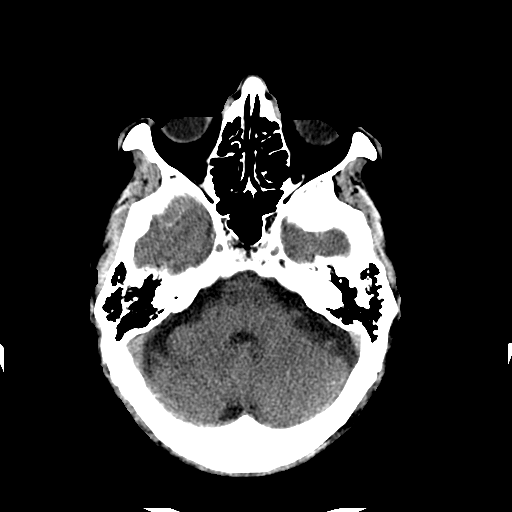
[im 9/32  brain]
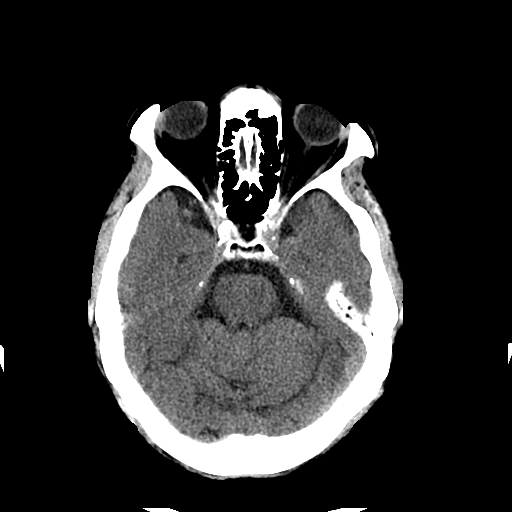
[im 12/32  brain]
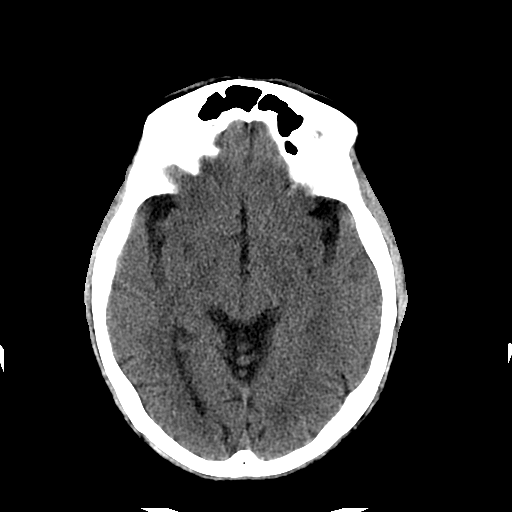
[im 12/32  bone]
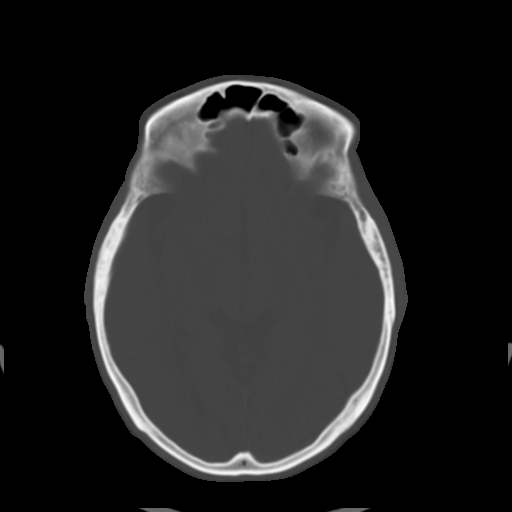
[im 14/32  brain]
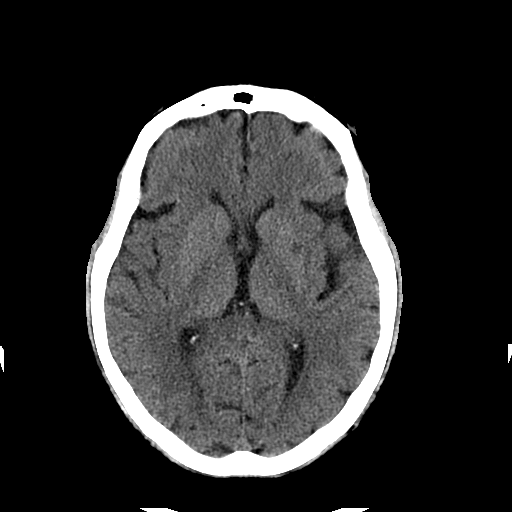
[im 16/32  brain]
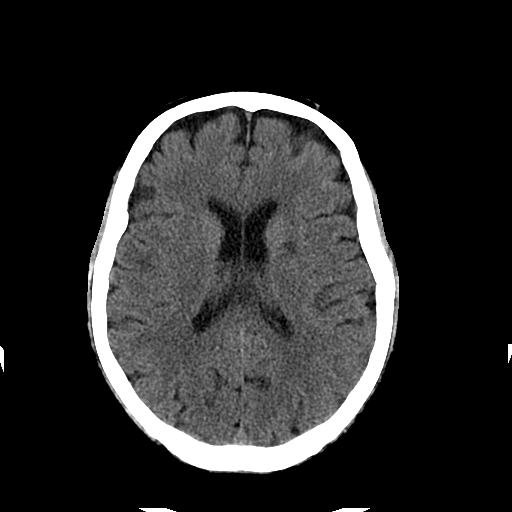
[im 18/32  brain]
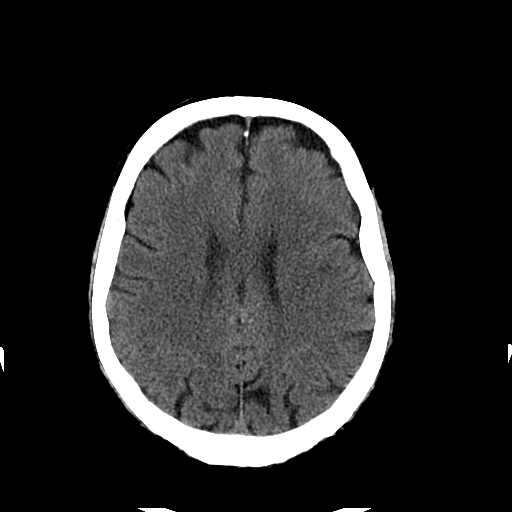
[im 20/32  brain]
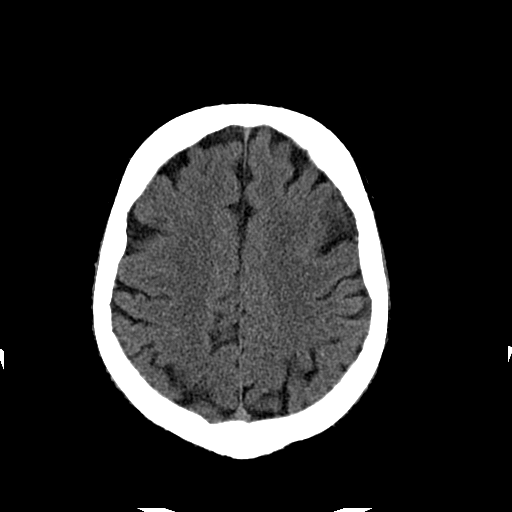
[im 20/32  bone]
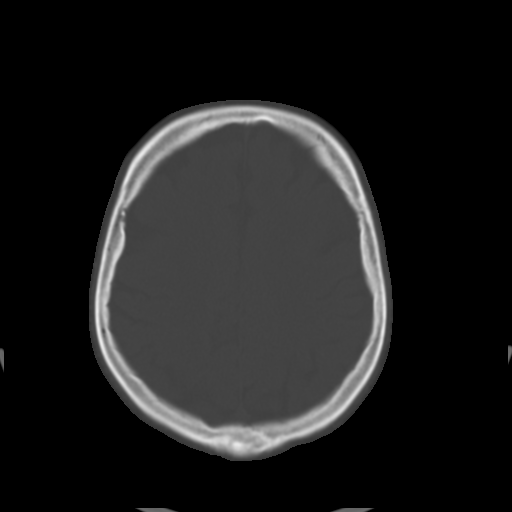
[im 23/32  brain]
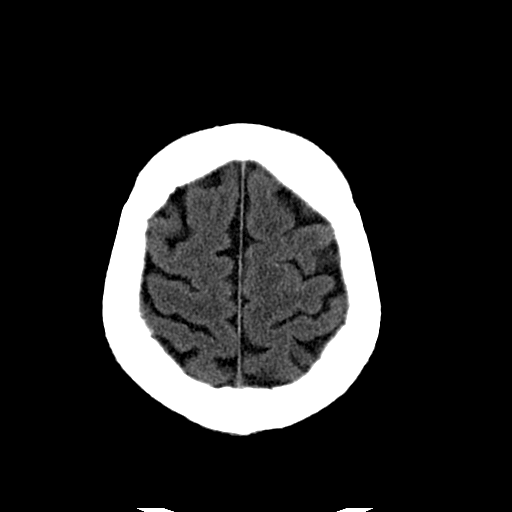
[im 25/32  brain]
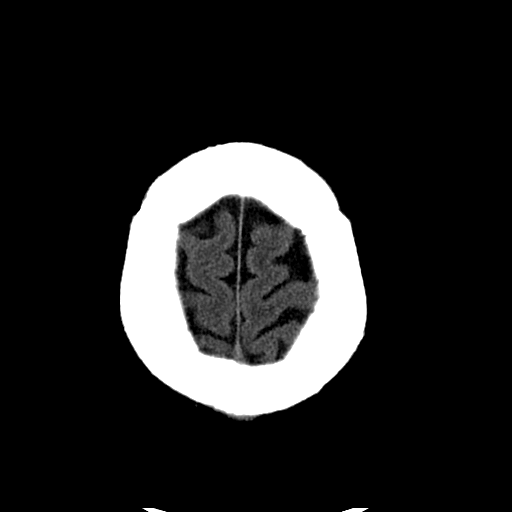
[im 27/32  brain]
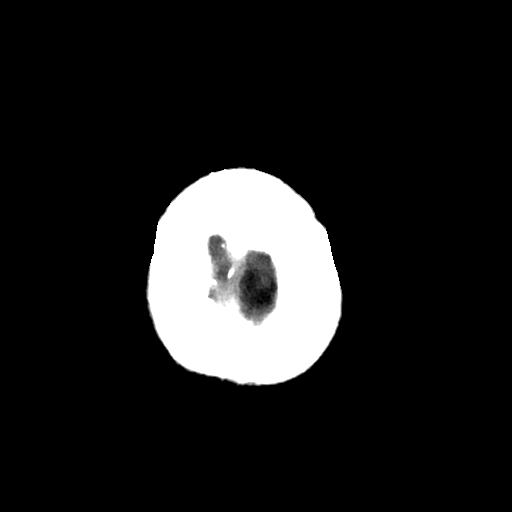
[im 29/32  brain]
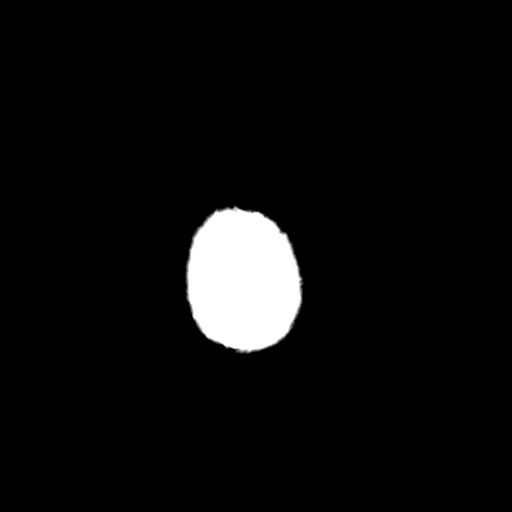
[im 29/32  bone]
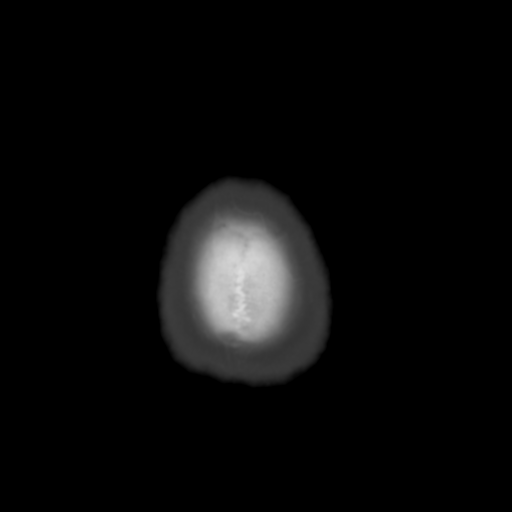

[Series 202: head w/o bone, idose (1) · axial · non-contrast · 0.49mm/px · z∈[+144,+164]mm · 2 of 32 slices shown]
[im 3/32  bone]
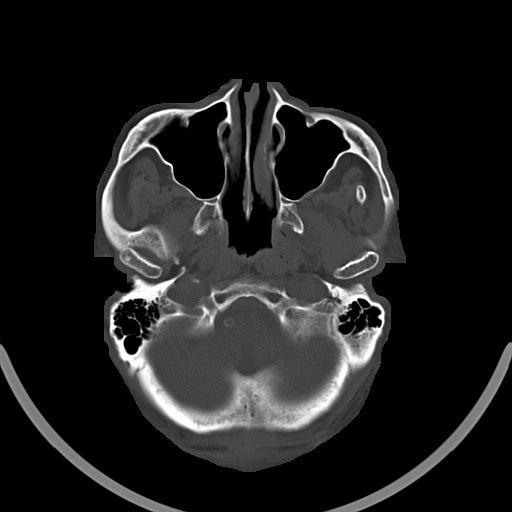
[im 7/32  bone]
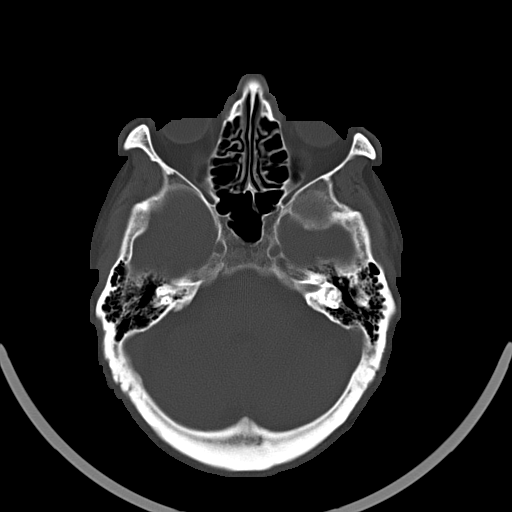

[15 of 30 positions shown; findings below may reference images not displayed]

FINDINGS: There is no evidence of acute cortical infarct, intracranial
hemorrhage, mass, midline shift, or extra-axial fluid collection.
Chronic infarcts are again seen in the left basal ganglia and right
cerebellum. Mild chronic small vessel ischemic disease is again seen
in the cerebral white matter bilaterally. There is mild cerebral
atrophy.

Orbits are unremarkable. The visualized paranasal sinuses and
mastoid air cells are clear. No acute osseous abnormality is
identified. Calcified atherosclerosis is noted at the skullbase.
IMPRESSION: 1. No evidence of acute intracranial abnormality.
2. Chronic right cerebellar and left basal ganglia infarcts.

## 2017-07-31 NOTE — Progress Notes (Signed)
Cardiology Office Note   Date:  08/01/2017   ID:  Jason Velasquez, DOB 1956/06/10, MRN [id number]  PCP:  [name], DO  Cardiologist:   Skeet Latch, MD   Chief Complaint  Patient presents with  . Follow-up    2 weeks;      Patient ID: Jason Velasquez is a 61 y.o. male with CAD s/p CABG, chronic stable angina, PAD, hypertension, diabetes and CVA who presents for follow up.  Jason Velasquez was first evaluated for chest pain on 07/27/15.  He previously underwent CABG in 2007. He had a nuclear stress test on 08/21/15 revealed LVEF 57% with moderate ischemia in the inferoseptal, inferior, and anterior locations. He subsequently underwent left heart catheterization on 08/26/15 that revealed severe three vessel disease including a 90% left main lesion and an occluded RCA and LAD. His LIMA to the LAD was patent but all other vein grafts were occluded. There were no optimal PCI targets.  He was started on carvedilol and Ranexa. Jason Velasquez also underwent ABI testing 08/2015 that revealed diffuse atherosclerosis from the proximal abdominal aorta through the common and external iliac arteries bilaterally. There was no detectable flow in the L peroneal or anterior tibial arteries. ABIs were near 0.8 bilaterally. He was referred to Dr. Gwenlyn Found where medical management was advised. He was noted to have carotid bruits and was referred for carotid ultrasound on 12/09/15.  This revealed 40-59% stenosis on the right and 1-39% stenosis on the left.  He had repeat LE Dopplers and ABIs 03/2017 that were unchanged from prior.  Medical management was recommended.    Jason Velasquez continued to have worsening angina despite optimal medical therapy.  He was referred for [facility]  05/2017 where he was found to have progression of the distal LM/LAD/LCx lesion.  He underwent DES placement in the ostial LCx, mid LCx, and  ostial LAD.  The ostial LAD was treated with a cutting ballpon reducing the stenosis from 85% to 30%.  It was  recommended that he continue clopidogrel and aspirin indefinitely.  He had an echo 06/01/17 that showed LVEF 65-70% with grade 1 diastolic dysfunction.  He followed up with [name] 06/08/17 and was doing well.  He had no chest pain but did have claudication.  Imdur was discontinued.  At his last appointment he had been doing well until the preceding 2 days when he started having recurrent angina.  Ranexa was restarted.  He reports feeling very sluggish and tired.  He has not been sleeping well because he continues to have chest pain at night.  It makes it difficult for him to sleep.  He feels better when sitting upright.  He takes subungual nitroglycerin was helps.  He has some chest pain while at work there is also responsive to nitroglycerin.  He denies shortness of breath or GERD.  He has not noted any lower extremity edema.    Past Medical History:  Diagnosis Date  . Angina, class III ([place]) 08/24/2015  . CAD (coronary artery disease) of artery bypass graft 06/25/2014   CABG- 2007   . CAD (coronary artery disease), native coronary artery 06/25/2014   CABG- 2007   . Carotid stenosis 03/03/2016   49-70% RICA, 2-63% LICA, >78% RECA stenosis Repeat 12/2016.  . Claudication ([place])   . Controlled type 2 diabetes mellitus without complication ([place]) 02/08/8501   Dx March of 2016. GAD65 negative Anti Islet cell negative    . DKA (diabetic ketoacidoses) ([place]) 12/07/2014  .  Dyspnea   . History of CVA (cerebrovascular accident) 07/12/2014  . Hx of adenomatous colonic polyps 05/27/2015  . Hypercholesterolemia   . Hypertension   . Hypertensive heart disease 03/03/2016  . S/P CABG (coronary artery bypass graft) 03/28/2015   2007 at [facility]   . Stroke ([place])    2015  . Tubular adenoma of colon 05/31/2015   Colonoscopy 05/20/15 with [name]: 3 tubullar adenoma's largest 2.3cm.  Plan for repeat Colonoscopy in 2019.    Past Surgical History:  Procedure Laterality Date  . CARDIAC  CATHETERIZATION N/A 08/26/2015   Procedure: Left Heart Cath and Cors/Grafts Angiography;  Surgeon: [name], MD;  Location: [facility];  Service: Cardiovascular;  Laterality: N/A;  . CARDIAC SURGERY    . CORONARY ARTERY BYPASS GRAFT    . CORONARY BALLOON ANGIOPLASTY N/A 05/25/2017   Procedure: CORONARY BALLOON ANGIOPLASTY;  Surgeon: [name], MD;  Location: [facility];  Service: Cardiovascular;  Laterality: N/A;  . CORONARY STENT INTERVENTION N/A 05/25/2017   Procedure: CORONARY STENT INTERVENTION;  Surgeon: [name], MD;  Location: [facility];  Service: Cardiovascular;  Laterality: N/A;  . LEFT HEART CATH AND CORS/GRAFTS ANGIOGRAPHY N/A 05/23/2017   Procedure: LEFT HEART CATH AND CORS/GRAFTS ANGIOGRAPHY;  Surgeon: [name], MD;  Location: [facility];  Service: Cardiovascular;  Laterality: N/A;    Current Outpatient Prescriptions  Medication Sig Dispense Refill  . aspirin EC 81 MG tablet Take 81 mg by mouth daily.    Marland Kitchen atorvastatin (LIPITOR) 40 MG tablet Take 1 tablet (40 mg total) by mouth every evening. 30 tablet 11  . clopidogrel (PLAVIX) 75 MG tablet Take 1 tablet (75 mg total) by mouth daily with breakfast. 30 tablet 11  . Lancets MISC 1 Device by Does not apply route 4 (four) times daily -  before meals and at bedtime. (Patient taking differently: 1 Device by Does not apply route 2 (two) times daily. ) 100 each 11  . lisinopril (PRINIVIL,ZESTRIL) 10 MG tablet Take 1 tablet (10 mg total) by mouth daily. 90 tablet 3  . metFORMIN (GLUCOPHAGE) 1000 MG tablet Take 1 tablet (1,000 mg total) by mouth 2 (two) times daily with a meal. 60 tablet 11  . metoprolol succinate (TOPROL XL) 100 MG 24 hr tablet Take 1 tablet (100 mg total) by mouth daily. Take with or immediately following a meal. 30 tablet 11  . nitroGLYCERIN (NITROSTAT) 0.4 MG SL tablet Place 1 tablet (0.4 mg total) under the tongue every 5 (five) minutes as needed for chest  pain. 25 tablet 5  . Omega-3 Fatty Acids (FISH OIL) 1000 MG CAPS Take 1,000 mg by mouth daily.     . ranolazine (RANEXA) 1000 MG SR tablet Take 1,000 mg by mouth 2 (two) times daily.    . sitaGLIPtin (JANUVIA) 100 MG tablet Take 1 tablet (100 mg total) by mouth daily. 30 tablet 11  . isosorbide mononitrate (IMDUR) 60 MG 24 hr tablet Take 1 tablet (60 mg total) by mouth daily. 30 tablet 5  . pantoprazole (PROTONIX) 40 MG tablet Take 1 tablet (40 mg total) by mouth daily. 30 tablet 11   No current facility-administered medications for this visit.     Allergies:   Patient has no known allergies.    Social History:  The patient  reports that he quit smoking about 7 years ago. He has never used smokeless tobacco. He reports that he does not  drink alcohol or use drugs.   Family History:  The patient's family history includes Diabetes in his father; Hypertension in his mother and sister.    ROS:  Please see the history of present illness.   Otherwise, review of systems are positive for none.   All other systems are reviewed and negative.    PHYSICAL EXAM: VS:  BP 116/73   Pulse 82   Ht 5\' 10"  (1.778 m)   Wt 95.3 kg (210 lb 3.2 oz)   BMI 30.16 kg/m  , BMI Body mass index is 30.16 kg/m. GENERAL:  Well appearing.  No acute distress HEENT:  Pupils equal round and reactive, fundi not visualized, oral mucosa unremarkable NECK:  No jugular venous distention, waveform within normal limits, carotid upstroke brisk and symmetric, no carotid bruit LUNGS:  Clear to auscultation bilaterally.  No crackles, wheezes or rhonchi HEART:  RRR.  PMI not displaced or sustained,S1 and S2 within normal limits, no S3, no S4, no clicks, no rubs, no murmurs ABD:  Flat, positive bowel sounds normal in frequency in pitch, no bruits, no rebound, no guarding, no midline pulsatile mass, no hepatomegaly, no splenomegaly EXT:  1+ R PT.  Absent DP.  Absent L PT or DP.  No edema, no cyanosis no clubbing.  2+ radial pulses.    SKIN:  No rashes no nodules.  Lack of hair in lower tibia NEURO:  Cranial nerves II through XII grossly intact, motor grossly intact throughout PSYCH:  Cognitively intact, oriented to person place and time   EKG:  EKG is not ordered today. The ekg ordered today demonstrates sinus rhythm rate 82 bpm.  Prior inferior infarct.  07/02/16: Sinus rhythm rate 75 bpm.  Prior inferior infarct  12/29/16: Sinus rhythm.  Incomplete RBBB.  Prior inferior infarct.  05/18/17: Sinus rhythm. Rate 87 bpm. B 1 PVCs. 07/18/17: Sinus rhythm.  Rate 71 bpm.    LHC 05/25/17:  Lesion #3: DLM lesion, 90 %stenosed.Ost Cx lesion, 95 %stenosed. Prox Cx lesion, 90 %stenosed. --> covered with 1 stent.  A STENT SYNERGY DES 3X16 drug eluting stent was successfully placed. Postdilated in the left main to 3.6 mm and in the proximal circumflex up to 3.4 mm.  Post intervention, there is a 0% residual stenosis.  Lesion #4 Ost LAD lesion, 85 %stenosed.  Post intervention, [place] Cutting Balloon PTCA only (through the left main-circumflex stent there is a 30% residual stenosis.  Lesion #2: Ost 1st Mrg to 1st Mrg lesion, 75 %stenosed.  A STENT SYNERGY DES 2.25X20 drug eluting stent was successfully placed. -With the proximal portion stented through. -  Post intervention, there is a 0% residual stenosis - in the majority a stent, with the ostial portion as roughly 50% stenosis.  Lesion 1: Mid Cx lesion, 95 %stenosed.  A STENT SYNERGY DES 2.25X16 drug eluting stent was successfully placed.  Post intervention, there is a 0% residual stenosis.  3rd Mrg lesion, 100 %stenosed.  Mid LAD lesion, 100 %stenosed.   Successful multisite PCI of essentially bifurcation distal left main-ostial proximal circumflex and ostial LAD treated with a stent from the distal left main into proximal circumflex crossing OM1. The stent was placed in OM1 to the graft insertion site. The mid cervix is also treated with a third stent. The ostial  LAD was treated with cutting balloon reducing the 85% lesion down to roughly 30%.  Echo 06/01/17: Study Conclusions  - Procedure narrative: Transthoracic echocardiography. Image   quality was poor. Intravenous contrast (Definity) was  administered. - Left ventricle: The cavity size was normal. There was mild focal   basal hypertrophy of the septum. Systolic function was vigorous.   The estimated ejection fraction was in the range of 65% to 70%.   Wall motion was normal; there were no regional wall motion   abnormalities. Doppler parameters are consistent with abnormal   left ventricular relaxation (grade 1 diastolic dysfunction). - Aortic valve: Trileaflet; mildly thickened, mildly calcified   leaflets. - Left atrium: The atrium was mildly dilated.   Lexiscan Cardiolite 08/21/15:  Nuclear stress EF: 57%.  The left ventricular ejection fraction is normal (55-65%).  There was no ST segment deviation noted during stress.  No T wave inversion was noted during stress.  Defect 1: There is a small defect of moderate severity present in the basal inferoseptal and basal inferior location.  Defect 2: There is a defect present in the basal anterior, mid anterior and apical anterior location.  Findings consistent with ischemia.  This is a high risk study.  High risk stress nuclear study with two areas of ischemia and normal left ventricular regional and global systolic function. The inferior area of ischemia is small. The anterior area of ischemia is extensive, but appears to be very mild. The septum is spared.  Carotid Doppler 12/11/15: 47-42% RICA, 5-95% LICA, >63% RECA stenosis  ABI 08/12/15: R 0.81, L 0.89 Diffuse atherosclerosis from the proximal abdominal aorta into both tibial arteries. Several areas of narrowing are noted in both lower extremities, although no focal significant stenosis is identified. One vessel run-off on the right, and three vessel run-off on the  left.  ABI 02/03/17: R 0.81, L 0.91 Abnormal lower arterial Doppler study, with waveforms suggestive of inflow disease. The bilateral ABI's are stable and in the mild range. The right great toe pressure is normal. The left great toe pressure is abnormal   Recent Labs: 05/26/2017: BUN 9; Creatinine, Ser 0.94; Hemoglobin 10.0; Platelets 254; Potassium 4.0; Sodium 136    Lipid Panel    Component Value Date/Time   CHOL 121 (L) 03/03/2016 1503   CHOL 129 07/10/2015 1418   TRIG 320 (H) 03/03/2016 1503   HDL 21 (L) 03/03/2016 1503   HDL 21 (L) 07/10/2015 1418   CHOLHDL 5.8 (H) 03/03/2016 1503   VLDL 64 (H) 03/03/2016 1503   LDLCALC 36 03/03/2016 1503   LDLCALC 45 07/10/2015 1418      Wt Readings from Last 3 Encounters:  08/01/17 95.3 kg (210 lb 3.2 oz)  07/18/17 95.7 kg (211 lb)  06/08/17 95.3 kg (210 lb 3.2 oz)      ASSESSMENT AND PLAN:  # CAD s/p CABG:  # Angina: Mr. Hilleary has severe 3 vessel CAD and all his CABG grafts are occluded with the exception of his LIMA to the LAD.  He underwent successful PCI of the LM, LCx and OM1.  He ran out of Ranexa and is now having more chest pain. It is not exertional, so it is unclear if this was related to angina or possibly GERD.  Given that it always occurs when lying down I am suspicious that it may actually be GERD or esophageal spasm.  We will sign his assistance form for Ranexa.  We will also restart Imdur 60mg  daily.  Trial of protonix 40 mg daily.  Continue atorvastatin and metoprolol.  Troponin was negative.  # Hypertension: Controlled.  Continue lisinopril, metoprolol, and Imdur as above.   # Hyperlipidemia:  Continue atorvastatin and fish oil. Repeat  lipids.   # Claudication: ABIs 0.8 bilaterally and medical management was recommended in 2016.  Unchanged on repeat.  Encouraged ambulation. Repeat ABIs in 1 year.  Continue aspirin and statin. Recommended that he follow-up with his PCP to see if this could be sciatica.  # Carotid  stenosis: Carotid Doppler revealed 50-49% R ICA stenosis and 1-39% stenosis on the left.  Continue aspirin and atorvastatin.   Current medicines are reviewed at length with the patient today.  The patient does not have concerns regarding medicines.  The following changes have been made:  Restart Imdur 60mg  daily.  Labs/ tests ordered today include:   No orders of the defined types were placed in this encounter.    Disposition:   FU with Tmya Wigington C. Oval Linsey, MD in 2 weeks.   Signed, Skeet Latch, MD  08/01/2017 1:15 PM    [facility]

## 2017-08-01 ENCOUNTER — Ambulatory Visit (INDEPENDENT_AMBULATORY_CARE_PROVIDER_SITE_OTHER): Payer: No Typology Code available for payment source | Admitting: Cardiovascular Disease

## 2017-08-01 ENCOUNTER — Ambulatory Visit: Payer: No Typology Code available for payment source | Admitting: Cardiovascular Disease

## 2017-08-01 ENCOUNTER — Encounter: Payer: Self-pay | Admitting: Cardiovascular Disease

## 2017-08-01 VITALS — BP 116/73 | HR 82 | Ht 70.0 in | Wt 210.2 lb

## 2017-08-01 DIAGNOSIS — I200 Unstable angina: Secondary | ICD-10-CM

## 2017-08-01 DIAGNOSIS — Z955 Presence of coronary angioplasty implant and graft: Secondary | ICD-10-CM

## 2017-08-01 DIAGNOSIS — I10 Essential (primary) hypertension: Secondary | ICD-10-CM

## 2017-08-01 DIAGNOSIS — I739 Peripheral vascular disease, unspecified: Secondary | ICD-10-CM

## 2017-08-01 DIAGNOSIS — I6523 Occlusion and stenosis of bilateral carotid arteries: Secondary | ICD-10-CM

## 2017-08-01 DIAGNOSIS — E7800 Pure hypercholesterolemia, unspecified: Secondary | ICD-10-CM

## 2017-08-01 MED ORDER — ISOSORBIDE MONONITRATE ER 60 MG PO TB24: 60.0000 mg | ORAL_TABLET | Freq: Every day | ORAL | 5 refills | Status: DC

## 2017-08-01 MED ORDER — PANTOPRAZOLE SODIUM 40 MG PO TBEC: 40.0000 mg | DELAYED_RELEASE_TABLET | Freq: Every day | ORAL | 11 refills | Status: DC

## 2017-08-01 MED FILL — PANTOPRAZOLE SOD DR 40 MG T: 40 | 30 days supply | Qty: 30 | Fill #0

## 2017-08-01 NOTE — Patient Instructions (Addendum)
Medication Instructions:  START ISOSORBIDE 60 MG DAILY  START PANTOPRAZOLE 40 MG DAILY   Labwork: NONE  Testing/Procedures: NONE  Follow-Up: Your physician recommends that you schedule a follow-up appointment in: 1 MONTH OV   If you need a refill on your cardiac medications before your next appointment, please call your pharmacy.

## 2017-08-10 ENCOUNTER — Telehealth: Payer: Self-pay | Admitting: Cardiovascular Disease

## 2017-08-10 NOTE — Telephone Encounter (Signed)
Pt c/o of Chest Pain: STAT if CP now or developed within 24 hours  1. Are you having CP right now? No   2. Are you experiencing any other symptoms (ex. SOB, nausea, vomiting, sweating)? Sweating, SOB   3. How long have you been experiencing CP? All day  4. Is your CP continuous or coming and going? Coming and going   5. Have you taken Nitroglycerin? Yes  ?

## 2017-08-10 NOTE — Telephone Encounter (Signed)
Spoke with pt, he reports waking this morning with chest discomfort. He reports a stabling, boring type pain in his chest. He took 2 NTG this morning and 1 NTG this afternoon. He reports the NTG will ease the discomfort but it will return with little exertion. He reports SOB and sweating with the pain. Denies nausea and vomiting. He reports this is the same discomfort he had prior to his recent 3 stents but reports it is worse. Advised the patient to call 911 or have someone take him to the ER. Patient voiced understanding

## 2017-08-11 ENCOUNTER — Encounter: Payer: Self-pay | Admitting: Cardiovascular Disease

## 2017-08-11 ENCOUNTER — Encounter (HOSPITAL_COMMUNITY)
Admission: RE | Disposition: A | Discharge status: Home / Self Care | Payer: Self-pay | Source: Ambulatory Visit | Attending: Cardiovascular Disease

## 2017-08-11 ENCOUNTER — Ambulatory Visit (INDEPENDENT_AMBULATORY_CARE_PROVIDER_SITE_OTHER): Payer: No Typology Code available for payment source | Admitting: Cardiovascular Disease

## 2017-08-11 ENCOUNTER — Inpatient Hospital Stay (HOSPITAL_COMMUNITY)
Admission: RE | Admit: 2017-08-11 | Discharge: 2017-08-15 | DRG: 287 | Disposition: A | Discharge status: Home / Self Care | Payer: Self-pay | Source: Ambulatory Visit | Attending: Cardiovascular Disease | Admitting: Cardiovascular Disease

## 2017-08-11 ENCOUNTER — Inpatient Hospital Stay
Admission: AD | Admit: 2017-08-11 | Payer: No Typology Code available for payment source | Source: Ambulatory Visit | Admitting: Cardiovascular Disease

## 2017-08-11 VITALS — BP 130/62 | HR 77 | Ht 70.0 in | Wt 213.4 lb

## 2017-08-11 DIAGNOSIS — D500 Iron deficiency anemia secondary to blood loss (chronic): Secondary | ICD-10-CM | POA: Diagnosis present

## 2017-08-11 DIAGNOSIS — I119 Hypertensive heart disease without heart failure: Secondary | ICD-10-CM | POA: Diagnosis present

## 2017-08-11 DIAGNOSIS — I252 Old myocardial infarction: Secondary | ICD-10-CM

## 2017-08-11 DIAGNOSIS — Z833 Family history of diabetes mellitus: Secondary | ICD-10-CM

## 2017-08-11 DIAGNOSIS — Z951 Presence of aortocoronary bypass graft: Secondary | ICD-10-CM

## 2017-08-11 DIAGNOSIS — I25110 Atherosclerotic heart disease of native coronary artery with unstable angina pectoris: Principal | ICD-10-CM | POA: Diagnosis present

## 2017-08-11 DIAGNOSIS — K922 Gastrointestinal hemorrhage, unspecified: Secondary | ICD-10-CM | POA: Diagnosis present

## 2017-08-11 DIAGNOSIS — I200 Unstable angina: Secondary | ICD-10-CM

## 2017-08-11 DIAGNOSIS — Z7984 Long term (current) use of oral hypoglycemic drugs: Secondary | ICD-10-CM

## 2017-08-11 DIAGNOSIS — Q2730 Arteriovenous malformation, site unspecified: Secondary | ICD-10-CM

## 2017-08-11 DIAGNOSIS — Z7902 Long term (current) use of antithrombotics/antiplatelets: Secondary | ICD-10-CM

## 2017-08-11 DIAGNOSIS — D509 Iron deficiency anemia, unspecified: Secondary | ICD-10-CM

## 2017-08-11 DIAGNOSIS — E7800 Pure hypercholesterolemia, unspecified: Secondary | ICD-10-CM | POA: Diagnosis present

## 2017-08-11 DIAGNOSIS — Z8249 Family history of ischemic heart disease and other diseases of the circulatory system: Secondary | ICD-10-CM

## 2017-08-11 DIAGNOSIS — E1151 Type 2 diabetes mellitus with diabetic peripheral angiopathy without gangrene: Secondary | ICD-10-CM | POA: Diagnosis present

## 2017-08-11 DIAGNOSIS — Z8673 Personal history of transient ischemic attack (TIA), and cerebral infarction without residual deficits: Secondary | ICD-10-CM

## 2017-08-11 DIAGNOSIS — Z7982 Long term (current) use of aspirin: Secondary | ICD-10-CM

## 2017-08-11 DIAGNOSIS — I214 Non-ST elevation (NSTEMI) myocardial infarction: Secondary | ICD-10-CM | POA: Diagnosis present

## 2017-08-11 DIAGNOSIS — Z955 Presence of coronary angioplasty implant and graft: Secondary | ICD-10-CM

## 2017-08-11 DIAGNOSIS — R079 Chest pain, unspecified: Secondary | ICD-10-CM | POA: Diagnosis present

## 2017-08-11 DIAGNOSIS — Z87891 Personal history of nicotine dependence: Secondary | ICD-10-CM

## 2017-08-11 DIAGNOSIS — E785 Hyperlipidemia, unspecified: Secondary | ICD-10-CM | POA: Diagnosis present

## 2017-08-11 DIAGNOSIS — K449 Diaphragmatic hernia without obstruction or gangrene: Secondary | ICD-10-CM | POA: Diagnosis present

## 2017-08-11 DIAGNOSIS — I739 Peripheral vascular disease, unspecified: Secondary | ICD-10-CM

## 2017-08-11 DIAGNOSIS — I10 Essential (primary) hypertension: Secondary | ICD-10-CM

## 2017-08-11 DIAGNOSIS — K31819 Angiodysplasia of stomach and duodenum without bleeding: Secondary | ICD-10-CM | POA: Diagnosis present

## 2017-08-11 HISTORY — DX: Arteriovenous malformation, site unspecified: Q27.30

## 2017-08-11 LAB — ABO/RH: ABO/RH(D): O POS

## 2017-08-11 LAB — CBC
HEMATOCRIT: 27.5 % — AB (ref 39.0–52.0)
HEMOGLOBIN: 8 g/dL — AB (ref 13.0–17.0)
MCH: 20.8 pg — AB (ref 26.0–34.0)
MCHC: 29.1 g/dL — AB (ref 30.0–36.0)
MCV: 71.6 fL — AB (ref 78.0–100.0)
Platelets: 240 10*3/uL (ref 150–400)
RBC: 3.84 MIL/uL — AB (ref 4.22–5.81)
RDW: 18.2 % — ABNORMAL HIGH (ref 11.5–15.5)
WBC: 4.5 10*3/uL (ref 4.0–10.5)

## 2017-08-11 LAB — GLUCOSE, CAPILLARY
GLUCOSE-CAPILLARY: 86 mg/dL (ref 65–99)
GLUCOSE-CAPILLARY: 108 mg/dL — AB (ref 65–99)
GLUCOSE-CAPILLARY: 115 mg/dL — AB (ref 65–99)
GLUCOSE-CAPILLARY: 122 mg/dL — AB (ref 65–99)

## 2017-08-11 LAB — PROTIME-INR
INR: 1.02
Prothrombin Time: 13.3 seconds (ref 11.4–15.2)

## 2017-08-11 LAB — BASIC METABOLIC PANEL
Anion gap: 8 (ref 5–15)
BUN: 14 mg/dL (ref 6–20)
CHLORIDE: 106 mmol/L (ref 101–111)
CO2: 21 mmol/L — AB (ref 22–32)
CREATININE: 1.16 mg/dL (ref 0.61–1.24)
Calcium: 9 mg/dL (ref 8.9–10.3)
GFR calc Af Amer: >60 mL/min (ref >60)
GFR calc non Af Amer: >60 mL/min (ref >60)
GLUCOSE: 104 mg/dL — AB (ref 65–99)
POTASSIUM: 4 mmol/L (ref 3.5–5.1)
Sodium: 135 mmol/L (ref 135–145)

## 2017-08-11 LAB — POCT HEMOGLOBIN-HEMACUE: HEMOGLOBIN: 7.7 g/dL — AB (ref 13.0–17.0)

## 2017-08-11 LAB — TROPONIN I: Troponin I: 0.27 ng/mL (ref <0.03)

## 2017-08-11 LAB — PREPARE RBC (CROSSMATCH)

## 2017-08-11 SURGERY — LEFT HEART CATH AND CORONARY ANGIOGRAPHY
Anesthesia: LOCAL

## 2017-08-11 MED ORDER — SODIUM CHLORIDE 0.9% FLUSH: 3.0000 mL | INTRAVENOUS | Status: DC | PRN

## 2017-08-11 MED ORDER — ASPIRIN EC 81 MG PO TBEC
81.0000 mg | DELAYED_RELEASE_TABLET | Freq: Every day | ORAL | Status: DC
  Administered 2017-08-12 – 2017-08-15 (×4): 81 mg via ORAL
  Filled 2017-08-11 (×4): qty 1

## 2017-08-11 MED ORDER — SODIUM CHLORIDE 0.9 % WEIGHT BASED INFUSION: 1.0000 mL/kg/h | INTRAVENOUS | Status: DC

## 2017-08-11 MED ORDER — ISOSORBIDE MONONITRATE ER 60 MG PO TB24
60.0000 mg | ORAL_TABLET | Freq: Every day | ORAL | Status: DC
  Administered 2017-08-11 – 2017-08-15 (×5): 60 mg via ORAL
  Filled 2017-08-11 (×5): qty 1

## 2017-08-11 MED ORDER — CLOPIDOGREL BISULFATE 75 MG PO TABS
75.0000 mg | ORAL_TABLET | Freq: Once | ORAL | Status: AC
  Administered 2017-08-11: 75 mg via ORAL

## 2017-08-11 MED ORDER — CLOPIDOGREL BISULFATE 75 MG PO TABS
75.0000 mg | ORAL_TABLET | Freq: Every day | ORAL | Status: DC
  Administered 2017-08-12 – 2017-08-15 (×4): 75 mg via ORAL
  Filled 2017-08-11 (×4): qty 1

## 2017-08-11 MED ORDER — NITROGLYCERIN 0.4 MG SL SUBL
0.4000 mg | SUBLINGUAL_TABLET | SUBLINGUAL | Status: DC | PRN
  Administered 2017-08-11 – 2017-08-12 (×2): 0.4 mg via SUBLINGUAL
  Filled 2017-08-11 (×3): qty 1

## 2017-08-11 MED ORDER — METOPROLOL SUCCINATE ER 100 MG PO TB24
100.0000 mg | ORAL_TABLET | Freq: Every day | ORAL | Status: DC
  Administered 2017-08-11 – 2017-08-15 (×5): 100 mg via ORAL
  Filled 2017-08-11 (×7): qty 1

## 2017-08-11 MED ORDER — ASPIRIN 81 MG PO CHEW
CHEWABLE_TABLET | ORAL | Status: AC
  Administered 2017-08-11: 81 mg via ORAL
  Filled 2017-08-11: qty 1

## 2017-08-11 MED ORDER — SODIUM CHLORIDE 0.9 % WEIGHT BASED INFUSION
3.0000 mL/kg/h | INTRAVENOUS | Status: AC
  Administered 2017-08-11: 3 mL/kg/h via INTRAVENOUS

## 2017-08-11 MED ORDER — ONDANSETRON HCL 4 MG/2ML IJ SOLN: 4.0000 mg | Freq: Four times a day (QID) | INTRAMUSCULAR | Status: DC | PRN

## 2017-08-11 MED ORDER — PANTOPRAZOLE SODIUM 40 MG PO TBEC
40.0000 mg | DELAYED_RELEASE_TABLET | Freq: Every day | ORAL | Status: DC
  Administered 2017-08-11 – 2017-08-12 (×2): 40 mg via ORAL
  Filled 2017-08-11 (×2): qty 1

## 2017-08-11 MED ORDER — ACETAMINOPHEN 325 MG PO TABS: 650.0000 mg | ORAL_TABLET | ORAL | Status: DC | PRN

## 2017-08-11 MED ORDER — SODIUM CHLORIDE 0.9 % IV SOLN: 250.0000 mL | INTRAVENOUS | Status: DC | PRN

## 2017-08-11 MED ORDER — RANOLAZINE ER 500 MG PO TB12
1000.0000 mg | ORAL_TABLET | Freq: Two times a day (BID) | ORAL | Status: DC
  Administered 2017-08-11 – 2017-08-15 (×8): 1000 mg via ORAL
  Filled 2017-08-11 (×8): qty 2

## 2017-08-11 MED ORDER — CLOPIDOGREL BISULFATE 75 MG PO TABS
ORAL_TABLET | ORAL | Status: AC
  Administered 2017-08-11: 75 mg via ORAL
  Filled 2017-08-11: qty 1

## 2017-08-11 MED ORDER — ASPIRIN 81 MG PO CHEW
81.0000 mg | CHEWABLE_TABLET | ORAL | Status: AC
  Administered 2017-08-11: 81 mg via ORAL

## 2017-08-11 MED ORDER — LISINOPRIL 10 MG PO TABS
10.0000 mg | ORAL_TABLET | Freq: Every day | ORAL | Status: DC
  Administered 2017-08-11 – 2017-08-15 (×5): 10 mg via ORAL
  Filled 2017-08-11 (×5): qty 1

## 2017-08-11 MED ORDER — SODIUM CHLORIDE 0.9% FLUSH: 3.0000 mL | Freq: Two times a day (BID) | INTRAVENOUS | Status: DC

## 2017-08-11 MED ORDER — ATORVASTATIN CALCIUM 40 MG PO TABS
40.0000 mg | ORAL_TABLET | Freq: Every evening | ORAL | Status: DC
  Administered 2017-08-11 – 2017-08-14 (×4): 40 mg via ORAL
  Filled 2017-08-11 (×4): qty 1

## 2017-08-11 NOTE — Plan of Care (Signed)
Oriented to room/unit. Hypertensive and anemic on admission. BP controlled with resumption of home meds and anemia treated with 1 unit PRBC's.

## 2017-08-11 NOTE — Progress Notes (Signed)
Pt states he had  chest pain of a 9 about 30 min ago and he took one of his home NTG.  States it has resolved now.  Stat labs obtained, pt placed on 2L 02 [place], telemetry, VS obtained, STAT EKG obtained.  Pt is now without chest pain and EKG showing NSR.  Lindsey Roberts,NP notified.  Pt is stable and resting.  Report given to [facility]

## 2017-08-11 NOTE — Addendum Note (Signed)
Addended by: Alvina Filbert B on: 08/11/2017 08:44 AM   Modules accepted: Orders

## 2017-08-11 NOTE — Progress Notes (Signed)
Waiting for IP bed placement

## 2017-08-11 NOTE — Progress Notes (Signed)
Hgb verified by hemocue @7 .Rector notified.  Pt to be admitted.

## 2017-08-11 NOTE — Addendum Note (Signed)
Addended by: Alvina Filbert B on: 08/11/2017 09:06 AM   Modules accepted: Orders

## 2017-08-11 NOTE — Progress Notes (Signed)
Cardiology Office Note   Date:  08/11/2017   ID:  Johanna Stafford, DOB 1956-07-10, MRN [id number]  PCP:  [name], DO  Cardiologist:   Skeet Latch, MD   No chief complaint on file.     Patient ID: Jason Velasquez is a 61 y.o. male with CAD s/p CABG, chronic stable angina, PAD, hypertension, diabetes and CVA who presents for follow up.  Jason Velasquez was first evaluated for chest pain on 07/27/15.  He previously underwent CABG in 2007. He had a nuclear stress test on 08/21/15 revealed LVEF 57% with moderate ischemia in the inferoseptal, inferior, and anterior locations. He subsequently underwent left heart catheterization on 08/26/15 that revealed severe three vessel disease including a 90% left main lesion and an occluded RCA and LAD. His LIMA to the LAD was patent but all other vein grafts were occluded. There were no optimal PCI targets.  He was started on carvedilol and Ranexa. Jason Velasquez also underwent ABI testing 08/2015 that revealed diffuse atherosclerosis from the proximal abdominal aorta through the common and external iliac arteries bilaterally. There was no detectable flow in the L peroneal or anterior tibial arteries. ABIs were near 0.8 bilaterally. He was referred to Dr. Gwenlyn Found where medical management was advised. He was noted to have carotid bruits and was referred for carotid ultrasound on 12/09/15.  This revealed 40-59% stenosis on the right and 1-39% stenosis on the left.  He had repeat LE Dopplers and ABIs 03/2017 that were unchanged from prior.  Medical management was recommended.    Jason Velasquez continued to have worsening angina despite optimal medical therapy.  He was referred for [facility]  05/2017 where he was found to have progression of the distal LM/LAD/LCx lesion.  He underwent DES placement in the ostial LCx, mid LCx, and  ostial LAD.  The ostial LAD was treated with a cutting ballpon reducing the stenosis from 85% to 30%.  It was recommended that he continue clopidogrel and  aspirin indefinitely.  He had an echo 06/01/17 that showed LVEF 65-70% with grade 1 diastolic dysfunction.  He followed up with [name] 06/08/17 and was doing well.  He had no chest pain but did have claudication.  Imdur was discontinued.  He followed up 07/18/17 and reported angina for the preceding 2 days.  Ranexa was restarted.  After making that change she continued to have angina, especially at night.  He followed up on 10/29 and continued to have angina.  There is some concern that it may also be GERD.  Therefore he was given a trial of pantoprazole and Imdur 60 mg daily was restarted.  He called our office 11/7 with progressive symptoms.  He got up to try and go to work yesterday morning and had a severe episode of angina that was associated with shortness of breath.  There is no nausea or diaphoresis but he did feel very clammy.  The episode lasted for 30 minutes and improved with nitroglycerin.  He tried to go to work but was unable.  He ultimately resigned because he has been unable to work secondary to angina. He continues to get angina with minimal exertion such as trying to make a pot of coffee.  He is also reported a few episodes of heart fluttering that lasts for 2 or 3 seconds and are not associated with chest pain or lightheadedness.     Past Medical History:  Diagnosis Date  . Angina, class III ([place]) 08/24/2015  . CAD (coronary  artery disease) of artery bypass graft 06/25/2014   CABG- 2007   . CAD (coronary artery disease), native coronary artery 06/25/2014   CABG- 2007   . Carotid stenosis 03/03/2016   88-41% RICA, 6-60% LICA, >63% RECA stenosis Repeat 12/2016.  . Claudication ([place])   . Controlled type 2 diabetes mellitus without complication ([place]) 0/10/6008   Dx March of 2016. GAD65 negative Anti Islet cell negative    . DKA (diabetic ketoacidoses) ([place]) 12/07/2014  . Dyspnea   . History of CVA (cerebrovascular accident) 07/12/2014  . Hx of adenomatous colonic polyps 05/27/2015  .  Hypercholesterolemia   . Hypertension   . Hypertensive heart disease 03/03/2016  . S/P CABG (coronary artery bypass graft) 03/28/2015   2007 at [place] [facility]   . Stroke ([place])    2015  . Tubular adenoma of colon 05/31/2015   Colonoscopy 05/20/15 with [name]: 3 tubullar adenoma's largest 2.3cm.  Plan for repeat Colonoscopy in 2019.    Past Surgical History:  Procedure Laterality Date  . CARDIAC SURGERY    . CORONARY ARTERY BYPASS GRAFT      Current Outpatient Medications  Medication Sig Dispense Refill  . aspirin EC 81 MG tablet Take 81 mg by mouth daily.    Marland Kitchen atorvastatin (LIPITOR) 40 MG tablet Take 1 tablet (40 mg total) by mouth every evening. 30 tablet 11  . clopidogrel (PLAVIX) 75 MG tablet Take 1 tablet (75 mg total) by mouth daily with breakfast. 30 tablet 11  . isosorbide mononitrate (IMDUR) 60 MG 24 hr tablet Take 1 tablet (60 mg total) by mouth daily. 30 tablet 5  . Lancets MISC 1 Device by Does not apply route 4 (four) times daily -  before meals and at bedtime. 100 each 11  . lisinopril (PRINIVIL,ZESTRIL) 10 MG tablet Take 1 tablet (10 mg total) by mouth daily. 90 tablet 3  . metFORMIN (GLUCOPHAGE) 1000 MG tablet Take 1 tablet (1,000 mg total) by mouth 2 (two) times daily with a meal. 60 tablet 11  . nitroGLYCERIN (NITROSTAT) 0.4 MG SL tablet Place 1 tablet (0.4 mg total) under the tongue every 5 (five) minutes as needed for chest pain. 25 tablet 5  . Omega-3 Fatty Acids (FISH OIL) 1000 MG CAPS Take 1,000 mg by mouth daily.     . pantoprazole (PROTONIX) 40 MG tablet Take 1 tablet (40 mg total) by mouth daily. 30 tablet 11  . ranolazine (RANEXA) 1000 MG SR tablet Take 1,000 mg by mouth 2 (two) times daily.    . sitaGLIPtin (JANUVIA) 100 MG tablet Take 1 tablet (100 mg total) by mouth daily. 30 tablet 11  . metoprolol succinate (TOPROL XL) 100 MG 24 hr tablet Take 1 tablet (100 mg total) by mouth daily. Take with or immediately following a meal. 30 tablet 11   No  current facility-administered medications for this visit.     Allergies:   Patient has no known allergies.    Social History:  The patient  reports that he quit smoking about 7 years ago. he has never used smokeless tobacco. He reports that he does not drink alcohol or use drugs.   Family History:  The patient's family history includes Diabetes in his father; Hypertension in his mother and sister.    ROS:  Please see the history of present illness.   Otherwise, review of systems are positive for none.   All other systems are reviewed and negative.    PHYSICAL EXAM: VS:  BP 130/62  Pulse 77   Ht 5\' 10"  (1.778 m)   Wt 96.8 kg (213 lb 6.4 oz)   SpO2 99%   BMI 30.62 kg/m  , BMI Body mass index is 30.62 kg/m. GENERAL:  Well appearing HEENT: Pupils equal round and reactive, fundi not visualized, oral mucosa unremarkable NECK:  No jugular venous distention, waveform within normal limits, carotid upstroke brisk and symmetric, no bruits, no thyromegaly LUNGS:  Clear to auscultation bilaterally HEART:  RRR.  PMI not displaced or sustained,S1 and S2 within normal limits, no S3, no S4, no clicks, no rubs, no murmurs ABD:  Flat, positive bowel sounds normal in frequency in pitch, no bruits, no rebound, no guarding, no midline pulsatile mass, no hepatomegaly, no splenomegaly EXT:  2 plus pulses throughout, no edema, no cyanosis no clubbing SKIN:  No rashes no nodules NEURO:  Cranial nerves II through XII grossly intact, motor grossly intact throughout PSYCH:  Cognitively intact, oriented to person place and time    EKG:  EKG is ordered today. The ekg ordered today demonstrates sinus rhythm rate 82 bpm.  Prior inferior infarct.  07/02/16: Sinus rhythm rate 75 bpm.  Prior inferior infarct  12/29/16: Sinus rhythm.  Incomplete RBBB.  Prior inferior infarct.  05/18/17: Sinus rhythm. Rate 87 bpm. B 1 PVCs. 07/18/17: Sinus rhythm.  Rate 71 bpm.  08/11/17: Sinus rhythm.  Rate 77 bpm.     LHC  05/25/17:  Lesion #3: DLM lesion, 90 %stenosed.Ost Cx lesion, 95 %stenosed. Prox Cx lesion, 90 %stenosed. --> covered with 1 stent.  A STENT SYNERGY DES 3X16 drug eluting stent was successfully placed. Postdilated in the left main to 3.6 mm and in the proximal circumflex up to 3.4 mm.  Post intervention, there is a 0% residual stenosis.  Lesion #4 Ost LAD lesion, 85 %stenosed.  Post intervention, [place] Cutting Balloon PTCA only (through the left main-circumflex stent there is a 30% residual stenosis.  Lesion #2: Ost 1st Mrg to 1st Mrg lesion, 75 %stenosed.  A STENT SYNERGY DES 2.25X20 drug eluting stent was successfully placed. -With the proximal portion stented through. -  Post intervention, there is a 0% residual stenosis - in the majority a stent, with the ostial portion as roughly 50% stenosis.  Lesion 1: Mid Cx lesion, 95 %stenosed.  A STENT SYNERGY DES 2.25X16 drug eluting stent was successfully placed.  Post intervention, there is a 0% residual stenosis.  3rd Mrg lesion, 100 %stenosed.  Mid LAD lesion, 100 %stenosed.   Successful multisite PCI of essentially bifurcation distal left main-ostial proximal circumflex and ostial LAD treated with a stent from the distal left main into proximal circumflex crossing OM1. The stent was placed in OM1 to the graft insertion site. The mid cervix is also treated with a third stent. The ostial LAD was treated with cutting balloon reducing the 85% lesion down to roughly 30%.  Echo 06/01/17: Study Conclusions  - Procedure narrative: Transthoracic echocardiography. Image   quality was poor. Intravenous contrast (Definity) was   administered. - Left ventricle: The cavity size was normal. There was mild focal   basal hypertrophy of the septum. Systolic function was vigorous.   The estimated ejection fraction was in the range of 65% to 70%.   Wall motion was normal; there were no regional wall motion   abnormalities. Doppler parameters  are consistent with abnormal   left ventricular relaxation (grade 1 diastolic dysfunction). - Aortic valve: Trileaflet; mildly thickened, mildly calcified   leaflets. - Left atrium: The atrium  was mildly dilated.   Lexiscan Cardiolite 08/21/15:  Nuclear stress EF: 57%.  The left ventricular ejection fraction is normal (55-65%).  There was no ST segment deviation noted during stress.  No T wave inversion was noted during stress.  Defect 1: There is a small defect of moderate severity present in the basal inferoseptal and basal inferior location.  Defect 2: There is a defect present in the basal anterior, mid anterior and apical anterior location.  Findings consistent with ischemia.  This is a high risk study.  High risk stress nuclear study with two areas of ischemia and normal left ventricular regional and global systolic function. The inferior area of ischemia is small. The anterior area of ischemia is extensive, but appears to be very mild. The septum is spared.  Carotid Doppler 12/11/15: 65-68% RICA, 1-27% LICA, >51% RECA stenosis  ABI 08/12/15: R 0.81, L 0.89 Diffuse atherosclerosis from the proximal abdominal aorta into both tibial arteries. Several areas of narrowing are noted in both lower extremities, although no focal significant stenosis is identified. One vessel run-off on the right, and three vessel run-off on the left.  ABI 02/03/17: R 0.81, L 0.91 Abnormal lower arterial Doppler study, with waveforms suggestive of inflow disease. The bilateral ABI's are stable and in the mild range. The right great toe pressure is normal. The left great toe pressure is abnormal   Recent Labs: 05/26/2017: BUN 9; Creatinine, Ser 0.94; Hemoglobin 10.0; Platelets 254; Potassium 4.0; Sodium 136    Lipid Panel    Component Value Date/Time   CHOL 121 (L) 03/03/2016 1503   CHOL 129 07/10/2015 1418   TRIG 320 (H) 03/03/2016 1503   HDL 21 (L) 03/03/2016 1503   HDL 21 (L)  07/10/2015 1418   CHOLHDL 5.8 (H) 03/03/2016 1503   VLDL 64 (H) 03/03/2016 1503   LDLCALC 36 03/03/2016 1503   LDLCALC 45 07/10/2015 1418      Wt Readings from Last 3 Encounters:  08/11/17 96.8 kg (213 lb 6.4 oz)  08/01/17 95.3 kg (210 lb 3.2 oz)  07/18/17 95.7 kg (211 lb)      ASSESSMENT AND PLAN:  # CAD s/p CABG:  # Angina: Jason Velasquez has severe 3 vessel CAD and all his CABG grafts are occluded with the exception of his LIMA to the LAD.  He underwent successful PCI of the LM, LCx and OM1.  He is on optimal medical therapy and now has unstable angina after feeling well initially after PCI.  I am concerned that he may have in-stent restenosis or thrombosis.  He has been compliant with all medications.  We will [facility] to [facility] today.  Continue aspirin, atorvastatin, clopidogrel, Imdur, metoprolol, and Ranexa.  We will stop pantoprazole as it has not helped his chest discomfort.  He had coffee at 7:15 this am but no food.   Risks and benefits of cardiac catheterization have been discussed with the patient.  The patient understands that risks included but are not limited to stroke (1 in 1000), death (1 in 60), kidney failure [usually temporary] (1 in 500), bleeding (1 in 200), allergic reaction [possibly serious] (1 in 200). The patient understands and agrees to proceed.   # Hypertension: Stable.  Continue lisinopril, metoprolol, and Imdur as above.   # Hyperlipidemia:  Continue atorvastatin and fish oil. Repeat lipids at follow up.    # Claudication: ABIs 0.8 bilaterally and medical management was recommended in 2016.  Unchanged on repeat.  Encouraged ambulation. Repeat ABIs in 1 year.  Continue aspirin and statin. Recommended that he follow-up with his PCP to see if this could be sciatica.  # Carotid stenosis: Carotid Doppler revealed 50-49% R ICA stenosis and 1-39% stenosis on the left.  Continue aspirin and atorvastatin.   Current medicines are  reviewed at length with the patient today.  The patient does not have concerns regarding medicines.  The following changes have been made:  none  Labs/ tests ordered today include:   No orders of the defined types were placed in this encounter.    Disposition:   FU with [place] C. Oval Linsey, MD in 2 weeks.   Signed, Skeet Latch, MD  08/11/2017 8:28 AM    [place]

## 2017-08-11 NOTE — H&P (Signed)
Cardiology Office Note   Date:  08/11/2017   ID:  Dub Maclellan, DOB [birth year], MRN [id number]  PCP:  [name], DO  Cardiologist:   Skeet Latch, MD  No chief complaint on file.     Patient ID: Caydence Enck is a 61 y.o. male with CAD s/p CABG, chronic stable angina, PAD, hypertension, diabetes and CVA who presents for follow up.  Mr. Caniglia was first evaluated for chest pain on 07/27/15.  He previously underwent CABG in 2007. He had a nuclear stress test on 08/21/15 revealed LVEF 57% with moderate ischemia in the inferoseptal, inferior, and anterior locations. He subsequently underwent left heart catheterization on 08/26/15 that revealed severe three vessel disease including a 90% left main lesion and an occluded RCA and LAD. His LIMA to the LAD was patent but all other vein grafts were occluded. There were no optimal PCI targets.  He was started on carvedilol and Ranexa. Mr. Aloi also underwent ABI testing 08/2015 that revealed diffuse atherosclerosis from the proximal abdominal aorta through the common and external iliac arteries bilaterally. There was no detectable flow in the L peroneal or anterior tibial arteries. ABIs were near 0.8 bilaterally. He was referred to Dr. Gwenlyn Found where medical management was advised. He was noted to have carotid bruits and was referred for carotid ultrasound on 12/09/15.  This revealed 40-59% stenosis on the right and 1-39% stenosis on the left.  He had repeat LE Dopplers and ABIs 03/2017 that were unchanged from prior.  Medical management was recommended.    Mr. Bolio continued to have worsening angina despite optimal medical therapy.  He was referred for [facility]  05/2017 where he was found to have progression of the distal LM/LAD/LCx lesion.  He underwent DES placement in the ostial LCx, mid LCx, and  ostial LAD.  The ostial LAD was treated with a cutting ballpon reducing the stenosis from 85% to 30%.  It was recommended that he continue clopidogrel and  aspirin indefinitely.  He had an echo 06/01/17 that showed LVEF 65-70% with grade 1 diastolic dysfunction.  He followed up with [name] 06/08/17 and was doing well.  He had no chest pain but did have claudication.  Imdur was discontinued.  He followed up 07/18/17 and reported angina for the preceding 2 days.  Ranexa was restarted.  After making that change she continued to have angina, especially at night.  He followed up on 10/29 and continued to have angina.  There is some concern that it may also be GERD.  Therefore he was given a trial of pantoprazole and Imdur 60 mg daily was restarted.  He called our office 11/7 with progressive symptoms.  He got up to try and go to work yesterday morning and had a severe episode of angina that was associated with shortness of breath.  There is no nausea or diaphoresis but he did feel very clammy.  The episode lasted for 30 minutes and improved with nitroglycerin.  He tried to go to work but was unable.  He ultimately resigned because he has been unable to work secondary to angina. He continues to get angina with minimal exertion such as trying to make a pot of coffee.  He is also reported a few episodes of heart fluttering that lasts for 2 or 3 seconds and are not associated with chest pain or lightheadedness.     Past Medical History:  Diagnosis Date  . Angina, class III ([place]) 08/24/2015  . CAD (coronary  artery disease) of artery bypass graft 06/25/2014   CABG- 2007   . CAD (coronary artery disease), native coronary artery 06/25/2014   CABG- 2007   . Carotid stenosis 03/03/2016   11-94% RICA, 1-74% LICA, >08% RECA stenosis Repeat 12/2016.  . Claudication ([place])   . Controlled type 2 diabetes mellitus without complication ([place]) 10/07/4816   Dx March of 2016. GAD65 negative Anti Islet cell negative    . DKA (diabetic ketoacidoses) ([place]) 12/07/2014  . Dyspnea   . History of CVA (cerebrovascular accident) 07/12/2014  . Hx of adenomatous colonic polyps 05/27/2015  .  Hypercholesterolemia   . Hypertension   . Hypertensive heart disease 03/03/2016  . S/P CABG (coronary artery bypass graft) 03/28/2015   2007 at [place] [facility]   . Stroke ([place])    2015  . Tubular adenoma of colon 05/31/2015   Colonoscopy 05/20/15 with [name]: 3 tubullar adenoma's largest 2.3cm.  Plan for repeat Colonoscopy in 2019.    Past Surgical History:  Procedure Laterality Date  . CARDIAC SURGERY    . CORONARY ARTERY BYPASS GRAFT      Current Facility-Administered Medications  Medication Dose Route Frequency Provider Last Rate Last Dose  . 0.9 %  sodium chloride infusion  250 mL Intravenous PRN Skeet Latch, MD      . 0.9% sodium chloride infusion  1 mL/kg/hr Intravenous Continuous Skeet Latch, MD 96.8 mL/hr at 08/11/17 1038 1 mL/kg/hr at 08/11/17 1038  . acetaminophen (TYLENOL) tablet 650 mg  650 mg Oral Q4H PRN Cheryln Manly, NP      . aspirin EC tablet 81 mg  81 mg Oral Daily Reino Bellis B, NP      . atorvastatin (LIPITOR) tablet 40 mg  40 mg Oral QPM Cheryln Manly, NP      . Derrill Memo ON 08/12/2017] clopidogrel (PLAVIX) tablet 75 mg  75 mg Oral Q breakfast Reino Bellis B, NP      . isosorbide mononitrate (IMDUR) 24 hr tablet 60 mg  60 mg Oral Daily Reino Bellis B, NP      . lisinopril (PRINIVIL,ZESTRIL) tablet 10 mg  10 mg Oral Daily Reino Bellis B, NP      . metoprolol succinate (TOPROL-XL) 24 hr tablet 100 mg  100 mg Oral Daily Reino Bellis B, NP      . nitroGLYCERIN (NITROSTAT) SL tablet 0.4 mg  0.4 mg Sublingual Q5 Min x 3 PRN Reino Bellis B, NP      . ondansetron (ZOFRAN) injection 4 mg  4 mg Intravenous Q6H PRN Cheryln Manly, NP      . pantoprazole (PROTONIX) EC tablet 40 mg  40 mg Oral Daily Reino Bellis B, NP      . ranolazine (RANEXA) 12 hr tablet 1,000 mg  1,000 mg Oral BID Reino Bellis B, NP      . sodium chloride flush (NS) 0.9 % injection 3 mL  3 mL Intravenous Q12H Skeet Latch, MD      .  sodium chloride flush (NS) 0.9 % injection 3 mL  3 mL Intravenous PRN Skeet Latch, MD        Allergies:   Patient has no known allergies.    Social History:  The patient  reports that he quit smoking about 7 years ago. he has never used smokeless tobacco. He reports that he does not drink alcohol or use drugs.   Family History:  The patient's family history includes Diabetes in his father; Hypertension in his  mother and sister.    ROS:  Please see the history of present illness.   Otherwise, review of systems are positive for none.   All other systems are reviewed and negative.    PHYSICAL EXAM: VS:  BP 130/80 (BP Location: Right Arm)   Pulse 79   Temp 97.7 F (36.5 C) (Oral)   Ht 5\' 10"  (1.778 m)   Wt 213 lb (96.6 kg)   SpO2 100%   BMI 30.56 kg/m  , BMI Body mass index is 30.56 kg/m. GENERAL:  Well appearing HEENT: Pupils equal round and reactive, fundi not visualized, oral mucosa unremarkable NECK:  No jugular venous distention, waveform within normal limits, carotid upstroke brisk and symmetric, no bruits, no thyromegaly LUNGS:  Clear to auscultation bilaterally HEART:  RRR.  PMI not displaced or sustained,S1 and S2 within normal limits, no S3, no S4, no clicks, no rubs, no murmurs ABD:  Flat, positive bowel sounds normal in frequency in pitch, no bruits, no rebound, no guarding, no midline pulsatile mass, no hepatomegaly, no splenomegaly EXT:  2 plus pulses throughout, no edema, no cyanosis no clubbing SKIN:  No rashes no nodules NEURO:  Cranial nerves II through XII grossly intact, motor grossly intact throughout PSYCH:  Cognitively intact, oriented to person place and time    EKG:  EKG is ordered today. The ekg ordered today demonstrates sinus rhythm rate 82 bpm.  Prior inferior infarct.  07/02/16: Sinus rhythm rate 75 bpm.  Prior inferior infarct  12/29/16: Sinus rhythm.  Incomplete RBBB.  Prior inferior infarct.  05/18/17: Sinus rhythm. Rate 87 bpm. B 1  PVCs. 07/18/17: Sinus rhythm.  Rate 71 bpm.  08/11/17: Sinus rhythm.  Rate 77 bpm.     LHC 05/25/17:  Lesion #3: DLM lesion, 90 %stenosed.Ost Cx lesion, 95 %stenosed. Prox Cx lesion, 90 %stenosed. --> covered with 1 stent.  A STENT SYNERGY DES 3X16 drug eluting stent was successfully placed. Postdilated in the left main to 3.6 mm and in the proximal circumflex up to 3.4 mm.  Post intervention, there is a 0% residual stenosis.  Lesion #4 Ost LAD lesion, 85 %stenosed.  Post intervention, Wolverine Cutting Balloon PTCA only (through the left main-circumflex stent there is a 30% residual stenosis.  Lesion #2: Ost 1st Mrg to 1st Mrg lesion, 75 %stenosed.  A STENT SYNERGY DES 2.25X20 drug eluting stent was successfully placed. -With the proximal portion stented through. -  Post intervention, there is a 0% residual stenosis - in the majority a stent, with the ostial portion as roughly 50% stenosis.  Lesion 1: Mid Cx lesion, 95 %stenosed.  A STENT SYNERGY DES 2.25X16 drug eluting stent was successfully placed.  Post intervention, there is a 0% residual stenosis.  3rd Mrg lesion, 100 %stenosed.  Mid LAD lesion, 100 %stenosed.   Successful multisite PCI of essentially bifurcation distal left main-ostial proximal circumflex and ostial LAD treated with a stent from the distal left main into proximal circumflex crossing OM1. The stent was placed in OM1 to the graft insertion site. The mid cervix is also treated with a third stent. The ostial LAD was treated with cutting balloon reducing the 85% lesion down to roughly 30%.  Echo 06/01/17: Study Conclusions  - Procedure narrative: Transthoracic echocardiography. Image   quality was poor. Intravenous contrast (Definity) was   administered. - Left ventricle: The cavity size was normal. There was mild focal   basal hypertrophy of the septum. Systolic function was vigorous.   The estimated ejection fraction  was in the range of 65% to 70%.    Wall motion was normal; there were no regional wall motion   abnormalities. Doppler parameters are consistent with abnormal   left ventricular relaxation (grade 1 diastolic dysfunction). - Aortic valve: Trileaflet; mildly thickened, mildly calcified   leaflets. - Left atrium: The atrium was mildly dilated.   Lexiscan Cardiolite 08/21/15:  Nuclear stress EF: 57%.  The left ventricular ejection fraction is normal (55-65%).  There was no ST segment deviation noted during stress.  No T wave inversion was noted during stress.  Defect 1: There is a small defect of moderate severity present in the basal inferoseptal and basal inferior location.  Defect 2: There is a defect present in the basal anterior, mid anterior and apical anterior location.  Findings consistent with ischemia.  This is a high risk study.  High risk stress nuclear study with two areas of ischemia and normal left ventricular regional and global systolic function. The inferior area of ischemia is small. The anterior area of ischemia is extensive, but appears to be very mild. The septum is spared.  Carotid Doppler 12/11/15: 25-36% RICA, 6-44% LICA, >03% RECA stenosis  ABI 08/12/15: R 0.81, L 0.89 Diffuse atherosclerosis from the proximal abdominal aorta into both tibial arteries. Several areas of narrowing are noted in both lower extremities, although no focal significant stenosis is identified. One vessel run-off on the right, and three vessel run-off on the left.  ABI 02/03/17: R 0.81, L 0.91 Abnormal lower arterial Doppler study, with waveforms suggestive of inflow disease. The bilateral ABI's are stable and in the mild range. The right great toe pressure is normal. The left great toe pressure is abnormal   Recent Labs: 08/11/2017: BUN 14; Creatinine, Ser 1.16; Hemoglobin 8.0; Platelets 240; Potassium 4.0; Sodium 135    Lipid Panel    Component Value Date/Time   CHOL 121 (L) 03/03/2016 1503   CHOL 129  07/10/2015 1418   TRIG 320 (H) 03/03/2016 1503   HDL 21 (L) 03/03/2016 1503   HDL 21 (L) 07/10/2015 1418   CHOLHDL 5.8 (H) 03/03/2016 1503   VLDL 64 (H) 03/03/2016 1503   LDLCALC 36 03/03/2016 1503   LDLCALC 45 07/10/2015 1418      Wt Readings from Last 3 Encounters:  08/11/17 213 lb (96.6 kg)  08/11/17 213 lb 6.4 oz (96.8 kg)  08/01/17 210 lb 3.2 oz (95.3 kg)      ASSESSMENT AND PLAN:  # CAD s/p CABG:  # Angina: Mr. Molyneux has severe 3 vessel CAD and all his CABG grafts are occluded with the exception of his LIMA to the LAD.  He underwent successful PCI of the LM, LCx and OM1.  He is on optimal medical therapy and now has unstable angina after feeling well initially after PCI.  I am concerned that he may have in-stent restenosis or thrombosis.  He has been compliant with all medications.  We will send [facility] to [facility] heart [facility] today.  Continue aspirin, atorvastatin, clopidogrel, Imdur, metoprolol, and Ranexa.  We will stop pantoprazole as it has not helped his chest discomfort.  He had coffee at 7:15 this am but no food.   Risks and benefits of cardiac catheterization have been discussed with the patient.  The patient understands that risks included but are not limited to stroke (1 in 1000), death (1 in 31), kidney failure [usually temporary] (1 in 500), bleeding (1 in 200), allergic reaction [possibly serious] (1 in 200).  The patient understands and agrees to proceed.   # Hypertension: Stable.  Continue lisinopril, metoprolol, and Imdur as above.   # Hyperlipidemia:  Continue atorvastatin and fish oil. Repeat lipids at follow up.    # Claudication: ABIs 0.8 bilaterally and medical management was recommended in 2016.  Unchanged on repeat.  Encouraged ambulation. Repeat ABIs in 1 year.  Continue aspirin and statin. Recommended that he follow-up with his PCP to see if this could be sciatica.  # Carotid stenosis: Carotid Doppler revealed 50-49% R ICA stenosis  and 1-39% stenosis on the left.  Continue aspirin and atorvastatin.   Current medicines are reviewed at length with the patient today.  The patient does not have concerns regarding medicines.  The following changes have been made:  none  Labs/ tests ordered today include:   Orders Placed This Encounter  Procedures  . Glucose, capillary  . CBC  . Basic metabolic panel  . Protime-INR  . HIV antibody (Routine Testing)  . Basic metabolic panel  . Lipid panel  . Troponin I  . Diet NPO time specified  . Informed consent details: transcribe and obtain patient signature  . Clip R groin  . Confirm CBC, BMP (or CMP), and PT/INR results within 7 days for inpatient and 14 days for outpatients.  External results are acceptable.  Place order for lab if results are missing or not within timeframe.  . Confirm EKG performed within 30 days for cardiac procedures and 12 months for peripheral vascular procedures.  Place order for EKG if missing or not within timeframe.  . Initiate Cath/PCI clinical path; encourage patient to watch CCTV video  . Verify aspirin and / or anti-platelet medication (Plavix, Effient, Brilinta) dose available for cardiac / peripheral vascular procedure day. IF ordered daily / once, adjust schedule to administer before procedure.  . Weigh patient  . Vital signs  . Cardiac monitoring  . Notify physician  . Insert and maintain IV access or saline lock  . Patient education (specify):  Marland Kitchen If diabetic or glucose greater than 140 mg/dL, notify MD for Glycemic Control (SSI) Order Set  . Patient education per clinical pathway  . RN may order Cardiology PRN Orders utilizing "Cardiology PRN medications" (through manage orders) for the following patient needs: indigestion (Maalox), cough (Robitussin DM), constipation (MOM), diarrhea (Imodium), or minor skin irritation (Hydrocortisone Cream)  . SCDs  . Up with assistance only  . Beta blocker already ordered  . Full code  . Oxygen  therapy  . Hemoglobin-hemacue, POC  . EKG 12-lead  . Insert peripheral IV X 2, second IV site NSL  . Admit to Inpatient (patient's expected length of stay will be greater than 2 midnights or inpatient only procedure)    Signed, Skeet Latch, MD 08/11/2017 8:23 AM  [facility]   Addendum:  On arrival, pre cath labs showed Hgb 8.0. In talking with patient he denies any s/s of bleeding over the past several days. No hematochezia, or hematuria. VSS, no chest pain at the time of interview  Plan: - admit to telemetry - transfuse 1 unit PRBCs - GI consult (reports he had a colonoscopy last year that was normal) - NPO for now until GI eval - defer cardiac cath until GI evaluation - cycle enzymes  Signed, Reino Bellis, NP-C 08/11/2017, 11:55 AM Pager: [phone]

## 2017-08-11 NOTE — Progress Notes (Signed)
Bed obtained for pt. 6E7C.  Report called to [place].  Transported via Brewing technologist with cardiac monitor on.

## 2017-08-11 NOTE — Progress Notes (Signed)
    Pt initial Trop 0.27, did have brief episode of chest pain on arrival that resolved with 1 SL nitro. Discussed with Dr. Oval Linsey, will wait to add heparin at this time until he has been evaluated by GI.   SignedReino Bellis, NP-C 08/11/2017, 2:50 PM Pager: [phone]

## 2017-08-11 NOTE — Patient Instructions (Signed)
Medication Instructions:  STOP PROTONIX  GO TO ADMITTING TODAY FOR CARDIAC CATHERETIZATION

## 2017-08-12 ENCOUNTER — Inpatient Hospital Stay (HOSPITAL_COMMUNITY): Payer: Self-pay | Admitting: Certified Registered Nurse Anesthetist

## 2017-08-12 ENCOUNTER — Other Ambulatory Visit: Payer: Self-pay

## 2017-08-12 ENCOUNTER — Encounter (HOSPITAL_COMMUNITY)
Admission: RE | Disposition: A | Discharge status: Home / Self Care | Payer: Self-pay | Source: Ambulatory Visit | Attending: Cardiovascular Disease

## 2017-08-12 ENCOUNTER — Encounter (HOSPITAL_COMMUNITY): Payer: Self-pay | Admitting: Certified Registered Nurse Anesthetist

## 2017-08-12 DIAGNOSIS — D509 Iron deficiency anemia, unspecified: Secondary | ICD-10-CM

## 2017-08-12 DIAGNOSIS — D508 Other iron deficiency anemias: Secondary | ICD-10-CM

## 2017-08-12 DIAGNOSIS — Z7901 Long term (current) use of anticoagulants: Secondary | ICD-10-CM

## 2017-08-12 DIAGNOSIS — I25709 Atherosclerosis of coronary artery bypass graft(s), unspecified, with unspecified angina pectoris: Secondary | ICD-10-CM

## 2017-08-12 DIAGNOSIS — I259 Chronic ischemic heart disease, unspecified: Secondary | ICD-10-CM

## 2017-08-12 DIAGNOSIS — K31819 Angiodysplasia of stomach and duodenum without bleeding: Secondary | ICD-10-CM | POA: Diagnosis present

## 2017-08-12 DIAGNOSIS — Z791 Long term (current) use of non-steroidal anti-inflammatories (NSAID): Secondary | ICD-10-CM

## 2017-08-12 DIAGNOSIS — D500 Iron deficiency anemia secondary to blood loss (chronic): Secondary | ICD-10-CM | POA: Diagnosis present

## 2017-08-12 DIAGNOSIS — K449 Diaphragmatic hernia without obstruction or gangrene: Secondary | ICD-10-CM

## 2017-08-12 DIAGNOSIS — I200 Unstable angina: Secondary | ICD-10-CM

## 2017-08-12 HISTORY — PX: ESOPHAGOGASTRODUODENOSCOPY: SHX5428

## 2017-08-12 LAB — CBC
HEMATOCRIT: 30 % — AB (ref 39.0–52.0)
HEMOGLOBIN: 8.7 g/dL — AB (ref 13.0–17.0)
MCH: 20.9 pg — AB (ref 26.0–34.0)
MCHC: 29 g/dL — ABNORMAL LOW (ref 30.0–36.0)
MCV: 72.1 fL — AB (ref 78.0–100.0)
PLATELETS: 209 10*3/uL (ref 150–400)
RBC: 4.16 MIL/uL — AB (ref 4.22–5.81)
RDW: 17.8 % — ABNORMAL HIGH (ref 11.5–15.5)
WBC: 5.4 10*3/uL (ref 4.0–10.5)

## 2017-08-12 LAB — BASIC METABOLIC PANEL
Anion gap: 8 (ref 5–15)
BUN: 12 mg/dL (ref 6–20)
CALCIUM: 8.8 mg/dL — AB (ref 8.9–10.3)
CO2: 22 mmol/L (ref 22–32)
CREATININE: 1.1 mg/dL (ref 0.61–1.24)
Chloride: 107 mmol/L (ref 101–111)
GFR calc Af Amer: >60 mL/min (ref >60)
GLUCOSE: 91 mg/dL (ref 65–99)
Potassium: 4.1 mmol/L (ref 3.5–5.1)
SODIUM: 137 mmol/L (ref 135–145)

## 2017-08-12 LAB — GLUCOSE, CAPILLARY
GLUCOSE-CAPILLARY: 112 mg/dL — AB (ref 65–99)
Glucose-Capillary: 151 mg/dL — ABNORMAL HIGH (ref 65–99)
Glucose-Capillary: 126 mg/dL — ABNORMAL HIGH (ref 65–99)
Glucose-Capillary: 127 mg/dL — ABNORMAL HIGH (ref 65–99)

## 2017-08-12 LAB — IRON AND TIBC
IRON: 12 ug/dL — AB (ref 45–182)
SATURATION RATIOS: 3 % — AB (ref 17.9–39.5)
TIBC: 400 ug/dL (ref 250–450)
UIBC: 388 ug/dL

## 2017-08-12 LAB — BPAM RBC
BLOOD PRODUCT EXPIRATION DATE: 201812052359
ISSUE DATE / TIME: 201811081521
Unit Type and Rh: 5100

## 2017-08-12 LAB — FOLATE: FOLATE: 15.5 ng/mL (ref >5.9)

## 2017-08-12 LAB — LIPID PANEL
Cholesterol: 93 mg/dL (ref 0–200)
HDL: 26 mg/dL — ABNORMAL LOW (ref >40)
LDL Cholesterol: 36 mg/dL (ref 0–99)
Total CHOL/HDL Ratio: 3.6 RATIO
Triglycerides: 157 mg/dL — ABNORMAL HIGH (ref <150)
VLDL: 31 mg/dL (ref 0–40)

## 2017-08-12 LAB — TYPE AND SCREEN
ABO/RH(D): O POS
ANTIBODY SCREEN: NEGATIVE
Unit division: 0

## 2017-08-12 LAB — TROPONIN I: TROPONIN I: 0.26 ng/mL — AB (ref <0.03)

## 2017-08-12 LAB — FERRITIN: Ferritin: 6 ng/mL — ABNORMAL LOW (ref 24–336)

## 2017-08-12 LAB — HIV ANTIBODY (ROUTINE TESTING W REFLEX): HIV Screen 4th Generation wRfx: NONREACTIVE

## 2017-08-12 SURGERY — EGD (ESOPHAGOGASTRODUODENOSCOPY)
Anesthesia: Monitor Anesthesia Care

## 2017-08-12 MED ORDER — SODIUM CHLORIDE 0.9 % IV SOLN
INTRAVENOUS | Status: DC | PRN
  Administered 2017-08-12: 14:00:00 via INTRAVENOUS

## 2017-08-12 MED ORDER — EPINEPHRINE PF 1 MG/10ML IJ SOSY
PREFILLED_SYRINGE | INTRAMUSCULAR | Status: AC
  Filled 2017-08-12: qty 10

## 2017-08-12 MED ORDER — BUTAMBEN-TETRACAINE-BENZOCAINE 2-2-14 % EX AERO
INHALATION_SPRAY | CUTANEOUS | Status: DC | PRN
  Administered 2017-08-12: 1 via TOPICAL

## 2017-08-12 MED ORDER — PANTOPRAZOLE SODIUM 40 MG PO TBEC
40.0000 mg | DELAYED_RELEASE_TABLET | Freq: Two times a day (BID) | ORAL | Status: DC
  Administered 2017-08-12 – 2017-08-15 (×6): 40 mg via ORAL
  Filled 2017-08-12 (×6): qty 1

## 2017-08-12 MED ORDER — ONDANSETRON HCL 4 MG/2ML IJ SOLN
INTRAMUSCULAR | Status: DC | PRN
  Administered 2017-08-12: 4 mg via INTRAVENOUS

## 2017-08-12 MED ORDER — SODIUM CHLORIDE 0.9 % IJ SOLN
INTRAMUSCULAR | Status: DC | PRN
  Administered 2017-08-12: 2 mL

## 2017-08-12 MED ORDER — PROPOFOL 10 MG/ML IV BOLUS
INTRAVENOUS | Status: DC | PRN
  Administered 2017-08-12 (×3): 10 mg via INTRAVENOUS

## 2017-08-12 MED ORDER — FERUMOXYTOL INJECTION 510 MG/17 ML
510.0000 mg | INTRAVENOUS | Status: AC
  Administered 2017-08-12 – 2017-08-15 (×2): 510 mg via INTRAVENOUS
  Filled 2017-08-12 (×2): qty 17

## 2017-08-12 MED ORDER — PROPOFOL 500 MG/50ML IV EMUL
INTRAVENOUS | Status: DC | PRN
  Administered 2017-08-12: 15:00:00 via INTRAVENOUS
  Administered 2017-08-12: 100 ug/kg/min via INTRAVENOUS

## 2017-08-12 NOTE — Progress Notes (Addendum)
Progress Note  Patient Name: Jason Velasquez Date of Encounter: 08/12/2017  Primary Cardiologist: Skeet Latch, MD     Subjective   At 6/10 chest pain with diaphoresis earlier this morning but is now chest pain-free, lying comfortably in bed, no shortness of breath.  His hemoglobin was 7.7 prior to 1 unit of blood transfusion.  He denies any melena, black tarry stools.  No obvious blood loss he states.  Has never had any problems with gastrointestinal bleeding in the past.  He is not taking excessive NSAIDs or aspirin.  He is currently on aspirin and Plavix.  Inpatient Medications    Scheduled Meds: . aspirin EC  81 mg Oral Daily  . atorvastatin  40 mg Oral QPM  . clopidogrel  75 mg Oral Q breakfast  . isosorbide mononitrate  60 mg Oral Daily  . lisinopril  10 mg Oral Daily  . metoprolol succinate  100 mg Oral Daily  . pantoprazole  40 mg Oral Daily  . ranolazine  1,000 mg Oral BID   Continuous Infusions:  PRN Meds: acetaminophen, nitroGLYCERIN, ondansetron (ZOFRAN) IV   Vital Signs    Vitals:   08/11/17 1915 08/11/17 2042 08/12/17 0500 08/12/17 0726  BP: 120/65 120/71 (!) 166/96 121/79  Pulse: 75 78 86   Resp: 18 18 18    Temp: 98.3 F (36.8 C) 97.6 F (36.4 C) 98.5 F (36.9 C)   TempSrc: Oral Oral Oral   SpO2: 100% 100% 100% 100%  Weight:   210 lb 8 oz (95.5 kg)   Height:        Intake/Output Summary (Last 24 hours) at 08/12/2017 1050 Last data filed at 08/12/2017 0728 Gross per 24 hour  Intake 670 ml  Output 1450 ml  Net -780 ml   Filed Weights   08/11/17 0909 08/11/17 1448 08/12/17 0500  Weight: 213 lb (96.6 kg) 210 lb 9.6 oz (95.5 kg) 210 lb 8 oz (95.5 kg)    Telemetry    No adverse arrhythmias, sinus rhythm- Personally Reviewed  ECG    Sinus rhythm no ST segment changes- Personally Reviewed  Physical Exam   GEN: No acute distress.  Comfortable laying in bed Neck: No JVD Cardiac: RRR, no murmurs, rubs, or gallops.  Respiratory: Clear to  auscultation bilaterally. GI: Soft, nontender, non-distended  MS: No edema; No deformity. Neuro:  Nonfocal  Psych: Normal affect   Labs    Chemistry Recent Labs  Lab 08/11/17 0942 08/12/17 0334  NA 135 137  K 4.0 4.1  CL 106 107  CO2 21* 22  GLUCOSE 104* 91  BUN 14 12  CREATININE 1.16 1.10  CALCIUM 9.0 8.8*  GFRNONAA >60 >60  GFRAA >60 >60  ANIONGAP 8 8     Hematology Recent Labs  Lab 08/11/17 0942 08/11/17 1035 08/12/17 0709  WBC 4.5  --  5.4  RBC 3.84*  --  4.16*  HGB 8.0* 7.7* 8.7*  HCT 27.5*  --  30.0*  MCV 71.6*  --  72.1*  MCH 20.8*  --  20.9*  MCHC 29.1*  --  29.0*  RDW 18.2*  --  17.8*  PLT 240  --  209    Cardiac Enzymes Recent Labs  Lab 08/11/17 1254 08/12/17 0019  TROPONINI 0.27* 0.26*   No results for input(s): TROPIPOC in the last 168 hours.   BNPNo results for input(s): BNP, PROBNP in the last 168 hours.   DDimer No results for input(s): DDIMER in the last 168 hours.  Radiology    No results found.  Cardiac Studies   LHC 05/25/17:  Lesion #3: DLM lesion, 90 %stenosed.Ost Cx lesion, 95 %stenosed. Prox Cx lesion, 90 %stenosed. --> covered with 1 stent.  A STENT SYNERGY DES 3X16 drug eluting stent was successfully placed. Postdilated in the left main to 3.6 mm and in the proximal circumflex up to 3.4 mm.  Post intervention, there is a 0% residual stenosis.  Lesion #4 Ost LAD lesion, 85 %stenosed.  Post intervention, Wolverine Cutting Balloon PTCA only (through the left main-circumflex stent there is a 30% residual stenosis.  Lesion #2: Ost 1st Mrg to 1st Mrg lesion, 75 %stenosed.  A STENT SYNERGY DES 2.25X20 drug eluting stent was successfully placed. -With the proximal portion stented through. -  Post intervention, there is a 0% residual stenosis - in the majority a stent, with the ostial portion as roughly 50% stenosis.  Lesion 1: Mid Cx lesion, 95 %stenosed.  A STENT SYNERGY DES 2.25X16 drug eluting stent was successfully  placed.  Post intervention, there is a 0% residual stenosis.  3rd Mrg lesion, 100 %stenosed.  Mid LAD lesion, 100 %stenosed.  Successful multisite PCI of essentially bifurcation distal left main-ostial proximal circumflex and ostial LAD treated with a stent from the distal left main into proximal circumflex crossing OM1. The stent was placed in OM1 to the graft insertion site. The mid cervix is also treated with a third stent. The ostial LAD was treated with cutting balloon reducing the 85% lesion down to roughly 30%.  Echo 06/01/17: Study Conclusions  - Procedure narrative: Transthoracic echocardiography. Image quality was poor. Intravenous contrast (Definity) was administered. - Left ventricle: The cavity size was normal. There was mild focal basal hypertrophy of the septum. Systolic function was vigorous. The estimated ejection fraction was in the range of 65% to 70%. Wall motion was normal; there were no regional wall motion abnormalities. Doppler parameters are consistent with abnormal left ventricular relaxation (grade 1 diastolic dysfunction). - Aortic valve: Trileaflet; mildly thickened, mildly calcified leaflets. - Left atrium: The atrium was mildly dilated.    Patient Profile     61 y.o. male with unstable anginal-like symptoms, mildly elevated troponin, recent heart catheterization with multisite PCI, newly discovered anemia, likely iron deficient with low MCV status post transfusion 1 unit on 08/11/17.  Assessment & Plan    Anemia -Discussed with GI consultant.  MCV 71.  Likely iron deficiency/blood loss.  He reports no melena.  I would like them to weigh in prior to proceeding with heart catheterization which may result in higher doses of anticoagulation.  I think it makes sense if GI feels comfortable to proceed with diagnostic EGD and I would feel comfortable him proceeding with this if necessary.  For instance, if he has a lesion that looks as  though it is been bleeding, it would make sense to know about this prior to sending him for potential further procedure from a cardiovascular perspective.  Currently continue with aspirin and Plavix given his recent high risk stent placement.  Unstable angina/CAD/status post CABG/status post multivessel PCI -Troponin is mildly positive.  Chest pain episode earlier this morning, now chest pain-free.  Historically it sounds like symptoms significantly changed approximately 1 month ago with exertional anginal symptoms.  He states that they are unmistakable.  Just following his cardiac catheterization and multisite PCI he stated that he felt much better.  Ultimately, I would like to pursue cardiac catheterization but I want to make sure that GI sees  him first to make sure that we are all on the same page.  He has received 1 unit of blood.  He responded appropriately moving from 7.7-8.7. -All of his grafts are down except for LIMA to LAD.  Previous successful stent placement PCI of left main, left circumflex and obtuse marginal 1. - Currently on maximal medical therapy including Imdur metoprolol and Ranexa.   For questions or updates, please contact [place] Please consult [email or web address] for contact info under Cardiology/STEMI.      Signed, Candee Furbish, MD  08/12/2017, 10:50 AM

## 2017-08-12 NOTE — Progress Notes (Signed)
ANTIBIOTIC CONSULT NOTE - INITIAL  Pharmacy Consult for IV iron Indication: iron deficiency anemia  No Known Allergies  Patient Measurements: Height: 5\' 10"  (177.8 cm) Weight: 210 lb (95.3 kg) IBW/kg (Calculated) : 73  Labs: Recent Labs    08/11/17 0942 08/11/17 1035 08/12/17 0334 08/12/17 0709  WBC 4.5  --   --  5.4  HGB 8.0* 7.7*  --  8.7*  PLT 240  --   --  209  CREATININE 1.16  --  1.10  --    Estimated Creatinine Clearance: 80.7 mL/min (by C-G formula based on SCr of 1.1 mg/dL).  Assessment:   61 yr old male with iron deficiency anemia.  Pharmacy has been asked to dose IV iron for target Hgb 14.   Hgb 8.7, Fe 12, tsat 3.     Calculated iron deficit for target Hgb on 14 is 1805 mg.    IV Infed is on back order.  Dosing with Feraheme.  Goal of Therapy:  Hgb 14  Plan:    Feraheme 510 mg IV today and repeat in 3-8 days.   Repeat dose scheduled for 08/15/17, if still inpatient.   If discharged before 2nd dose is due, would schedule outpatient dose for any day next week.  Arty Baumgartner, [place] Pager: [phone] 08/12/2017,4:26 PM

## 2017-08-12 NOTE — Consult Note (Signed)
Referring Provider:  Ezequiel Kayser Primary Care Physician:  [name], DO Primary Gastroenterologist: Silvano Rusk,  MD  Reason for Consultation:  anemia  ASSESSMENT AND PLAN:  20. 61 yo male with symptomatic microcytic anemia. Baseline hgb ~ 13, it fell to 10 in August around time he was taking NSAIDS and also started on plavix following PCI. Hgb found to be 8.0 yesterday. No overt GI bleeding. Stool occult blood test hasn't been done yet.  -We will arrange for EGD to be done today (on plavix) since patient may need further cardiac intervention.  Hopefully with discontinuation of ibuprofen and recent initation of PPI there will be no need to pursue additional GI workup if EGD is negative.  He is current with colonoscopy, last one 2 years ago.  The risks and benefits of the procedure were discussed and the patient agrees to proceed. -obtaining iron studies -his hgb rose one gram ypk 8.7 after one unit of blood   2. CAD, remote CABG and PCI in August of this year ( 3 vessel disease). Recurrent chest pain. Cardiology started PPI on 10/29 for possible GERD. While some of the chest pain is while resting at night he also has exertional chest pain and recent development of SOB which could all be from new anemia. His troponin is elevated.  -I spoke with Cardiology ,Dr. Marlou Porch, this am. Patient may need to have another cath soon but there is obviously concern about the anemia and potential for bleeding during cath if heparinized.   HPI: Jason Velasquez is a 61 y.o. male known to Dr. Carlean Purl for a hx of adenomatous colon polyps. Patient has CAD, s/p remote CABG. He had been having chest pain over the summer. Took ibuprofen for the pain. He underwent cardiac cath in August, found to have 3 vessel disease and underwent PPI. He was already on daily asa, plavix added post PCI. He continued to have some chest discomfort at night. Cardiology started him on PPI on 10/29. He had ongoing chest  discomfort,including exertional chest pain and also developed SOB. He saw Cardiology yesterday, plan was for repeat Cath but labs revealed new anemia. Hgb was 13 in 2016, 10 in August and then ~ 8 yesterday. No overt GI bleeding. No abdominal pain, nausea or other GI complaints. He has no hx of PUD. Taking PPI as directed. He got a unit of blood with a one gram rise in hgb. Today he has no chest pain or SOB   Past Medical History:  Diagnosis Date  . Angina, class III ([place]) 08/24/2015  . CAD (coronary artery disease) of artery bypass graft 06/25/2014   CABG- 2007   . CAD (coronary artery disease), native coronary artery 06/25/2014   CABG- 2007   . Carotid stenosis 03/03/2016   87-56% RICA, 4-33% LICA, >29% RECA stenosis Repeat 12/2016.  . Claudication ([place])   . Controlled type 2 diabetes mellitus without complication ([place]) 02/02/8840   Dx March of 2016. GAD65 negative Anti Islet cell negative    . DKA (diabetic ketoacidoses) ([place]) 12/07/2014  . Dyspnea   . History of CVA (cerebrovascular accident) 07/12/2014  . Hx of adenomatous colonic polyps 05/27/2015  . Hypercholesterolemia   . Hypertension   . Hypertensive heart disease 03/03/2016  . S/P CABG (coronary artery bypass graft) 03/28/2015   2007 at [facility]   . Stroke ([place])    2015  . Tubular adenoma of colon 05/31/2015   Colonoscopy 05/20/15 with [name]: 3 tubullar adenoma's  largest 2.3cm.  Plan for repeat Colonoscopy in 2019.    Past Surgical History:  Procedure Laterality Date  . CARDIAC SURGERY    . CORONARY ARTERY BYPASS GRAFT      Prior to Admission medications   Medication Sig Start Date End Date Taking? Authorizing Provider  aspirin EC 81 MG tablet Take 81 mg by mouth daily.   Yes [provider]  atorvastatin (LIPITOR) 40 MG tablet Take 1 tablet (40 mg total) by mouth every evening. 07/04/17 07/04/18 Yes [name], DO  clopidogrel (PLAVIX) 75 MG tablet Take 1 tablet (75 mg total) by mouth daily with  breakfast. 05/27/17  Yes Carlota Raspberry, Tiffany, PA-C  Hypromellose (ARTIFICIAL TEARS OP) Apply 1 drop daily as needed to eye (dry eyes).   Yes [provider]  isosorbide mononitrate (IMDUR) 60 MG 24 hr tablet Take 1 tablet (60 mg total) by mouth daily. 08/01/17 10/30/17 Yes Skeet Latch, MD  lisinopril (PRINIVIL,ZESTRIL) 10 MG tablet Take 1 tablet (10 mg total) by mouth daily. 09/28/16  Yes Erlene Quan, PA-C  metFORMIN (GLUCOPHAGE) 1000 MG tablet Take 1 tablet (1,000 mg total) by mouth 2 (two) times daily with a meal. 05/28/17 05/25/18 Yes Carlota Raspberry, Tiffany, PA-C  metoprolol succinate (TOPROL XL) 100 MG 24 hr tablet Take 1 tablet (100 mg total) by mouth daily. Take with or immediately following a meal. 07/29/16 08/11/17 Yes Hoffman, Rachel Moulds, DO  nitroGLYCERIN (NITROSTAT) 0.4 MG SL tablet Place 1 tablet (0.4 mg total) under the tongue every 5 (five) minutes as needed for chest pain. 05/18/17 08/16/17 Yes Skeet Latch, MD  pantoprazole (PROTONIX) 40 MG tablet Take 40 mg daily by mouth. 08/01/17  Yes [provider]  ranolazine (RANEXA) 1000 MG SR tablet Take 1,000 mg by mouth 2 (two) times daily.   Yes [provider]  sitaGLIPtin (JANUVIA) 100 MG tablet Take 1 tablet (100 mg total) by mouth daily. 07/29/16  Yes [name], DO  Lancets MISC 1 Device by Does not apply route 4 (four) times daily -  before meals and at bedtime. 12/09/14   Florinda Marker, MD    Current Facility-Administered Medications  Medication Dose Route Frequency Provider Last Rate Last Dose  . acetaminophen (TYLENOL) tablet 650 mg  650 mg Oral Q4H PRN Reino Bellis B, NP      . aspirin EC tablet 81 mg  81 mg Oral Daily Reino Bellis B, NP   81 mg at 08/12/17 0923  . atorvastatin (LIPITOR) tablet 40 mg  40 mg Oral QPM Reino Bellis B, NP   40 mg at 08/11/17 1825  . clopidogrel (PLAVIX) tablet 75 mg  75 mg Oral Q breakfast Reino Bellis B, NP   75 mg at 08/12/17 8469  . isosorbide mononitrate  (IMDUR) 24 hr tablet 60 mg  60 mg Oral Daily Reino Bellis B, NP   60 mg at 08/12/17 0923  . lisinopril (PRINIVIL,ZESTRIL) tablet 10 mg  10 mg Oral Daily Reino Bellis B, NP   10 mg at 08/12/17 0923  . metoprolol succinate (TOPROL-XL) 24 hr tablet 100 mg  100 mg Oral Daily Reino Bellis B, NP   100 mg at 08/12/17 0927  . nitroGLYCERIN (NITROSTAT) SL tablet 0.4 mg  0.4 mg Sublingual Q5 Min x 3 PRN Reino Bellis B, NP   0.4 mg at 08/12/17 0630  . ondansetron (ZOFRAN) injection 4 mg  4 mg Intravenous Q6H PRN Cheryln Manly, NP      . pantoprazole ([place])  EC tablet 40 mg  40 mg Oral Daily Reino Bellis B, NP   40 mg at 08/12/17 5361  . ranolazine (RANEXA) 12 hr tablet 1,000 mg  1,000 mg Oral BID Reino Bellis B, NP   1,000 mg at 08/12/17 4431    Allergies as of 08/11/2017  . (No Known Allergies)    Family History  Problem Relation Age of Onset  . Hypertension Mother   . Diabetes Father   . Hypertension Sister   . Colon cancer Neg Hx     Social History   Socioeconomic History  . Marital status: Divorced    Spouse name: Not on file  . Number of children: Not on file  . Years of education: 37  . Highest education level: Not on file  Social Needs  . Financial resource strain: Not on file  . Food insecurity - worry: Not on file  . Food insecurity - inability: Not on file  . Transportation needs - medical: Not on file  . Transportation needs - non-medical: Not on file  Occupational History    Employer: UNEMPLOYED  Tobacco Use  . Smoking status: Former Smoker    Last attempt to quit: 10/04/2009    Years since quitting: 7.8  . Smokeless tobacco: Never Used  Substance and Sexual Activity  . Alcohol use: No    Alcohol/week: 0.0 oz  . Drug use: No  . Sexual activity: Not on file  Other Topics Concern  . Not on file  Social History Narrative   Epworth Sleepiness Scale = 7 (as of 07/28/2015)    Review of Systems: All systems reviewed and negative except  where noted in HPI.  Physical Exam: Vital signs in last 24 hours: Temp:  [97.6 F (36.4 C)-98.5 F (36.9 C)] 98.5 F (36.9 C) (11/09 0500) Pulse Rate:  [70-86] 86 (11/09 0500) Resp:  [18] 18 (11/09 0500) BP: (120-189)/(65-118) 121/79 (11/09 0726) SpO2:  [100 %] 100 % (11/09 0726) Weight:  [210 lb 8 oz (95.5 kg)-210 lb 9.6 oz (95.5 kg)] 210 lb 8 oz (95.5 kg) (11/09 0500) Last BM Date: 08/10/17 General:   Alert, well-developed,  Black  male in NAD Psych:  Pleasant, cooperative. Normal mood and affect. Eyes:  Pupils equal, sclera clear, no icterus.   Conjunctiva pink. Ears:  Normal auditory acuity. Nose:  No deformity, discharge,  or lesions. Neck:  Supple; no masses Lungs:  Clear throughout to auscultation.   No wheezes, crackles, or rhonchi.  Heart:  Regular rate and rhythm; no murmurs, no edema Abdomen:  Soft, non-distended, nontender, BS active, no palp mass    Rectal:  Deferred  Msk:  Symmetrical without gross deformities. . Pulses:  Normal pulses noted. Neurologic:  Alert and  oriented x4;  grossly normal neurologically. Skin:  Intact without significant lesions or rashes..   Intake/Output from previous day: 11/08 0701 - 11/09 0700 In: 670 [Blood:670] Out: 900 [Urine:900] Intake/Output this shift: Total I/O In: -  Out: 550 [Urine:550]  Lab Results: Recent Labs    08/11/17 0942 08/11/17 1035 08/12/17 0709  WBC 4.5  --  5.4  HGB 8.0* 7.7* 8.7*  HCT 27.5*  --  30.0*  PLT 240  --  209   BMET Recent Labs    08/11/17 0942 08/12/17 0334  NA 135 137  K 4.0 4.1  CL 106 107  CO2 21* 22  GLUCOSE 104* 91  BUN 14 12  CREATININE 1.16 1.10  CALCIUM 9.0 8.8*   LFT  PT/INR  Recent Labs    08/11/17 0942  LABPROT 13.3  INR 1.02    Studies/Results: No results found.   Tye Savoy, NP-C @  08/12/2017, 11:19 AM  Pager number [phone]

## 2017-08-12 NOTE — Anesthesia Postprocedure Evaluation (Signed)
Anesthesia Post Note  Patient: Jason Velasquez  Procedure(s) Performed: ESOPHAGOGASTRODUODENOSCOPY (EGD) (N/A )     Patient location during evaluation: Endoscopy Anesthesia Type: MAC Level of consciousness: awake Pain management: pain level controlled Vital Signs Assessment: post-procedure vital signs reviewed and stable Respiratory status: spontaneous breathing Cardiovascular status: stable Anesthetic complications: no    Last Vitals:  Vitals:   08/12/17 1515 08/12/17 1527  BP: (!) 174/82 126/69  Pulse: 74 73  Resp: 16 19  Temp: 36.6 C   SpO2: 98% 99%    Last Pain:  Vitals:   08/12/17 1515  TempSrc: Oral  PainSc:                  Sylvain Hasten

## 2017-08-12 NOTE — Transfer of Care (Signed)
Immediate Anesthesia Transfer of Care Note  Patient: Jason Velasquez  Procedure(s) Performed: ESOPHAGOGASTRODUODENOSCOPY (EGD) (N/A )  Patient Location: Endoscopy Unit  Anesthesia Type:MAC  Level of Consciousness: awake and alert   Airway & Oxygen Therapy: Patient Spontanous Breathing and Patient connected to nasal cannula oxygen  Post-op Assessment: Report given to RN, Post -op Vital signs reviewed and stable and Patient moving all extremities X 4  Post vital signs: Reviewed and stable  Last Vitals:  Vitals:   08/12/17 0726 08/12/17 1327  BP: 121/79 138/68  Pulse:    Resp:  13  Temp:  36.6 C  SpO2: 100% 100%    Last Pain:  Vitals:   08/12/17 1327  TempSrc: Oral  PainSc:       Patients Stated Pain Goal: 0 (38/18/29 9371)  Complications: No apparent anesthesia complications

## 2017-08-12 NOTE — Op Note (Addendum)
[facility] Patient Name: Jason Velasquez Procedure Date : 08/12/2017 MRN: [id number] Attending MD: Jerene Bears , MD Date of Birth: 05-16-1956 CSN: [id number] Age: 61 Admit Type: Inpatient Procedure:                Upper GI endoscopy Indications:              Iron deficiency anemia secondary to chronic blood                            loss, progressive anemia without overt bleeding                            since starting Plavix in August 2018 Providers:                Lajuan Lines. Hilarie Fredrickson, MD, Cleda Daub, RN, Elspeth Cho Tech., Technician, Garrison [place], CRNA Referring MD:             Candee Furbish, MD (Cardiology) Medicines:                Monitored Anesthesia Care Complications:            No immediate complications. Estimated Blood Loss:     Estimated blood loss was minimal. Procedure:                Pre-Anesthesia Assessment:                           - Prior to the procedure, a History and Physical                            was performed, and patient medications and                            allergies were reviewed. The patient's tolerance of                            previous anesthesia was also reviewed. The risks                            and benefits of the procedure and the sedation                            options and risks were discussed with the patient.                            All questions were answered, and informed consent                            was obtained. Prior Anticoagulants: The patient has                            taken Plavix (clopidogrel), last dose was day of  procedure. ASA Grade Assessment: III - A patient                            with severe systemic disease. After reviewing the                            risks and benefits, the patient was deemed in                            satisfactory condition to undergo the procedure.                           After obtaining informed  consent, the endoscope was                            passed under direct vision. Throughout the                            procedure, the patient's blood pressure, pulse, and                            oxygen saturations were monitored continuously. The                            EG-[id number] ([id number]) scope was introduced through the                            mouth, and advanced to the second part of duodenum.                            The upper GI endoscopy was accomplished without                            difficulty. The patient tolerated the procedure                            well. Scope In: Scope Out: Findings:      Normal mucosa was found in the entire esophagus.      A 2 cm hiatal hernia was present.      Two small non-actively bleeding angiodysplastic lesions were found in       the gastric body. Both lesions were successfully injected with 1 mL of a       1:10,000 solution of epinephrine (0.5 mL at each lesion) for improved       access (by lifting the lesion prior to destruction and to minimize       bleeding given Plavix use). Fulguration to ablate the lesion to prevent       bleeding by argon plasma at 1 liter/minute and 20 watts was successful.      No other significant abnormalities were identified in a careful       examination of the stomach.      A single 6 mm angiodysplastic lesion with typical arborization was found       in the duodenal bulb. Area was successfully injected with 1 mL of a  1:10,000 solution of epinephrine for improved access (by lifting the       lesion prior to destruction). Fulguration to ablate the lesion to       prevent bleeding by argon plasma at 0.5 liters/minute and 20 watts was       successful. After destruction of this lesion, there was oozing. For       hemostasis, one hemostatic clip was successfully placed. There was no       bleeding at the end of the maneuver. Impression:               - Normal mucosa was found in the entire  esophagus.                           - 2 cm hiatal hernia.                           - Two non-bleeding angiodysplastic lesions in the                            stomach. Injected. Destroyed with argon plasma                            coagulation (APC).                           - A single angiodysplastic lesion in the duodenum.                            Injected. Destroyed with argon plasma coagulation                            (APC). Clip was placed.                           - No specimens collected. Moderate Sedation:      N/A Recommendation:           - Return patient to hospital ward for ongoing care.                           - Resume previous diet.                           - Continue present medications.                           - Would continue twice daily PPI for 2 weeks, then                            once daily thereafter.                           - Would recommend IV iron while here (if he                            receives [place], he would need a second dose in 7  days as an outpatient) Procedure Code(s):        --- Professional ---                           [phone], Esophagogastroduodenoscopy, flexible,                            transoral; with control of bleeding, any method                           43236, 59, Esophagogastroduodenoscopy, flexible,                            transoral; with directed submucosal injection(s),                            any substance Diagnosis Code(s):        --- Professional ---                           K44.9, Diaphragmatic hernia without obstruction or                            gangrene                           K31.819, Angiodysplasia of stomach and duodenum                            without bleeding                           D50.0, Iron deficiency 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 All rights reserved. The codes documented in this report are preliminary and upon coder review may  be revised to meet current compliance requirements. Jerene Bears, MD 08/12/2017 3:27:20 PM This report has been signed electronically. Number of Addenda: 0

## 2017-08-12 NOTE — Progress Notes (Signed)
Pt reporting chest pain this morning w/ sweating 6/10. Relieved by Nitro X1. EKG performed. Will continue to monitor.

## 2017-08-12 NOTE — Plan of Care (Signed)
Patient denies anxiety. Remains calm and cooperative

## 2017-08-12 NOTE — Anesthesia Procedure Notes (Signed)
Procedure Name: MAC Date/Time: 08/12/2017 2:30 PM Performed by: Harden Mo, CRNA Pre-anesthesia Checklist: Patient identified, Emergency Drugs available, Suction available and Patient being monitored Patient Re-evaluated:Patient Re-evaluated prior to induction Oxygen Delivery Method: Nasal cannula Preoxygenation: Pre-oxygenation with 100% oxygen Induction Type: IV induction Placement Confirmation: positive ETCO2 and breath sounds checked- equal and bilateral Dental Injury: Teeth and Oropharynx as per pre-operative assessment

## 2017-08-12 NOTE — Anesthesia Preprocedure Evaluation (Addendum)
Anesthesia Evaluation  Patient identified by MRN, date of birth, ID band Patient awake    Reviewed: Allergy & Precautions, NPO status , Patient's Chart, lab work & pertinent test results  Airway Mallampati: II  TM Distance: >3 FB     Dental  (+) Dental Advisory Given, Edentulous Upper, Edentulous Lower   Pulmonary shortness of breath, former smoker,    breath sounds clear to auscultation       Cardiovascular hypertension, + angina + CAD, + CABG and + Peripheral Vascular Disease   Rhythm:Regular Rate:Normal     Neuro/Psych    GI/Hepatic negative GI ROS, Neg liver ROS,   Endo/Other  diabetes  Renal/GU Renal disease     Musculoskeletal   Abdominal   Peds  Hematology   Anesthesia Other Findings   Reproductive/Obstetrics                            Anesthesia Physical Anesthesia Plan  ASA: IV  Anesthesia Plan: MAC   Post-op Pain Management:    Induction: Intravenous  PONV Risk Score and Plan: 1 and Treatment may vary due to age or medical condition, Ondansetron and Propofol infusion  Airway Management Planned: Simple Face Mask  Additional Equipment:   Intra-op Plan:   Post-operative Plan:   Informed Consent: I have reviewed the patients History and Physical, chart, labs and discussed the procedure including the risks, benefits and alternatives for the proposed anesthesia with the patient or authorized representative who has indicated his/her understanding and acceptance.   Dental advisory given  Plan Discussed with: CRNA and Anesthesiologist  Anesthesia Plan Comments:         Anesthesia Quick Evaluation

## 2017-08-13 DIAGNOSIS — D500 Iron deficiency anemia secondary to blood loss (chronic): Secondary | ICD-10-CM

## 2017-08-13 DIAGNOSIS — K31819 Angiodysplasia of stomach and duodenum without bleeding: Secondary | ICD-10-CM

## 2017-08-13 DIAGNOSIS — I25119 Atherosclerotic heart disease of native coronary artery with unspecified angina pectoris: Secondary | ICD-10-CM

## 2017-08-13 LAB — CBC
HEMATOCRIT: 31.4 % — AB (ref 39.0–52.0)
HEMOGLOBIN: 9.6 g/dL — AB (ref 13.0–17.0)
MCH: 21.9 pg — AB (ref 26.0–34.0)
MCHC: 30.6 g/dL (ref 30.0–36.0)
MCV: 71.7 fL — ABNORMAL LOW (ref 78.0–100.0)
Platelets: 227 10*3/uL (ref 150–400)
RBC: 4.38 MIL/uL (ref 4.22–5.81)
RDW: 17.9 % — AB (ref 11.5–15.5)
WBC: 5.7 10*3/uL (ref 4.0–10.5)

## 2017-08-13 LAB — GLUCOSE, CAPILLARY
GLUCOSE-CAPILLARY: 109 mg/dL — AB (ref 65–99)
Glucose-Capillary: 134 mg/dL — ABNORMAL HIGH (ref 65–99)
Glucose-Capillary: 105 mg/dL — ABNORMAL HIGH (ref 65–99)
Glucose-Capillary: 83 mg/dL (ref 65–99)

## 2017-08-13 LAB — VITAMIN B12: Vitamin B-12: 174 pg/mL — ABNORMAL LOW (ref 180–914)

## 2017-08-13 NOTE — Plan of Care (Signed)
Cardiac/Carb modified diet resumed.

## 2017-08-13 NOTE — Progress Notes (Signed)
Patient ID: Gualberto Wahlen, male   DOB: 1956-01-14, 61 y.o.   MRN: [id number]     Progress Note   Subjective   Feels Ok - no complaints- no CP, no Bm since procedures HGB pending this am   Objective   Vital signs in last 24 hours: Temp:  [97.8 F (36.6 C)-97.9 F (36.6 C)] 97.9 F (36.6 C) (11/10 0606) Pulse Rate:  [68-87] 87 (11/10 0606) Resp:  [9-19] 15 (11/10 0606) BP: (97-174)/(67-89) 153/89 (11/10 0606) SpO2:  [98 %-100 %] 98 % (11/10 0606) Weight:  [204 lb 11.2 oz (92.9 kg)-210 lb (95.3 kg)] 204 lb 11.2 oz (92.9 kg) (11/10 0606) Last BM Date: 08/10/17 General: AA male in NAD Heart:  Regular rate and rhythm; no murmurs Lungs: Respirations even and unlabored, lungs CTA bilaterally Abdomen:  Soft, nontender and nondistended. Normal bowel sounds. Extremities:  Without edema. Neurologic:  Alert and oriented,  grossly normal neurologically. Psych:  Cooperative. Normal mood and affect.  Intake/Output from previous day: 11/09 0701 - 11/10 0700 In: 757 [P.O.:240; I.V.:400; IV Piggyback:117] Out: 2460 [Urine:2450; Blood:10] Intake/Output this shift: No intake/output data recorded.  Lab Results: Recent Labs    08/11/17 0942 08/11/17 1035 08/12/17 0709  WBC 4.5  --  5.4  HGB 8.0* 7.7* 8.7*  HCT 27.5*  --  30.0*  PLT 240  --  209   BMET Recent Labs    08/11/17 0942 08/12/17 0334  NA 135 137  K 4.0 4.1  CL 106 107  CO2 21* 22  GLUCOSE 104* 91  BUN 14 12  CREATININE 1.16 1.10  CALCIUM 9.0 8.8*   LFT No results for input(s): PROT, ALBUMIN, AST, ALT, ALKPHOS, BILITOT, BILIDIR, IBILI in the last 72 hours. PT/INR Recent Labs    08/11/17 0942  LABPROT 13.3  INR 1.02      Assessment / Plan:    #1 61 yo male with  history of significant coronary artery disease status post PCI and started on Plavix several months ago who is admitted with complaints of chest pain and found to have significant microcytic anemia,ron deficient. . Patient had been using NSAIDs at  home.  EGD yesterday with finding of  nonbleeding AVMs in the stomach which were injected and treated with APC, also one AVM in the duodenum also treated with APC and endoclipped  AVMs are felt to be source for chronic GI blood loss and iron deficiency anemia   Plan; Check hemoglobin this morning and will need to follow serial hemoglobins post treatment of AVMs especially in setting of ongoing Plavix use  Stop NSAIDs Patient received initial IV iron infusion last evening and will need second infusion and around a week or prior to discharge Cardiology decision regarding repeat pending GI will sign off, please call for questionsq      Contact  Derryck Shahan [place], P.A.-C               [phone]      Active Problems:   Chest pain   Angiodysplasia of stomach and duodenum   Microcytic anemia     LOS: 2 days   Keryn Nessler  08/13/2017, 8:39 AM

## 2017-08-13 NOTE — Progress Notes (Deleted)
Converted to SR from Afib. Dr. Harrell Gave notified. EKG done,

## 2017-08-13 NOTE — Progress Notes (Signed)
Progress Note  Patient Name: Jason Velasquez Date of Encounter: 08/13/2017  Primary Cardiologist: Dr. Skeet Latch  Subjective   No chest pain this morning.  No breathlessness or abdominal pain.  No recent changes in stools.  Inpatient Medications    Scheduled Meds: . aspirin EC  81 mg Oral Daily  . atorvastatin  40 mg Oral QPM  . clopidogrel  75 mg Oral Q breakfast  . isosorbide mononitrate  60 mg Oral Daily  . lisinopril  10 mg Oral Daily  . metoprolol succinate  100 mg Oral Daily  . pantoprazole  40 mg Oral BID AC  . ranolazine  1,000 mg Oral BID   Continuous Infusions: . ferumoxytol Stopped (08/12/17 2051)   PRN Meds: acetaminophen, nitroGLYCERIN, ondansetron (ZOFRAN) IV   Vital Signs    Vitals:   08/12/17 2051 08/12/17 2053 08/13/17 0606 08/13/17 0909  BP: 111/67  (!) 153/89 (!) 148/82  Pulse: 68  87 73  Resp: 15  15 15   Temp:  97.8 F (36.6 C) 97.9 F (36.6 C) 97.9 F (36.6 C)  TempSrc:  Oral Oral Oral  SpO2: 100%  98% 100%  Weight:   204 lb 11.2 oz (92.9 kg)   Height:        Intake/Output Summary (Last 24 hours) at 08/13/2017 1118 Last data filed at 08/13/2017 0500 Gross per 24 hour  Intake 757 ml  Output 1910 ml  Net -1153 ml   Filed Weights   08/12/17 0500 08/12/17 1327 08/13/17 0606  Weight: 210 lb 8 oz (95.5 kg) 210 lb (95.3 kg) 204 lb 11.2 oz (92.9 kg)    Telemetry    Sinus rhythm.  Personally reviewed.  ECG    Tracing from 08/12/2017 shows sinus rhythm with diffuse ST-T wave abnormalities and prolonged QT interval.  Personally reviewed.  Physical Exam   GEN: No acute distress.   Neck: No JVD. Cardiac: RRR, no murmur, rub, or gallop.  Respiratory: Nonlabored. Clear to auscultation bilaterally. GI: Soft, nontender, bowel sounds present. MS: No edema; No deformity. Neuro:  Nonfocal. Psych: Alert and oriented x 3. Normal affect.  Labs    Chemistry Recent Labs  Lab 08/11/17 0942 08/12/17 0334  NA 135 137  K 4.0 4.1  CL  106 107  CO2 21* 22  GLUCOSE 104* 91  BUN 14 12  CREATININE 1.16 1.10  CALCIUM 9.0 8.8*  GFRNONAA >60 >60  GFRAA >60 >60  ANIONGAP 8 8     Hematology Recent Labs  Lab 08/11/17 0942 08/11/17 1035 08/12/17 0709 08/13/17 1011  WBC 4.5  --  5.4 5.7  RBC 3.84*  --  4.16* 4.38  HGB 8.0* 7.7* 8.7* 9.6*  HCT 27.5*  --  30.0* 31.4*  MCV 71.6*  --  72.1* 71.7*  MCH 20.8*  --  20.9* 21.9*  MCHC 29.1*  --  29.0* 30.6  RDW 18.2*  --  17.8* 17.9*  PLT 240  --  209 227    Cardiac Enzymes Recent Labs  Lab 08/11/17 1254 08/12/17 0019  TROPONINI 0.27* 0.26*   No results for input(s): TROPIPOC in the last 168 hours.   Radiology    No results found.  Cardiac Studies   Echocardiogram 06/01/2017: Study Conclusions  - Procedure narrative: Transthoracic echocardiography. Image   quality was poor. Intravenous contrast (Definity) was   administered. - Left ventricle: The cavity size was normal. There was mild focal   basal hypertrophy of the septum. Systolic function was vigorous.  The estimated ejection fraction was in the range of 65% to 70%.   Wall motion was normal; there were no regional wall motion   abnormalities. Doppler parameters are consistent with abnormal   left ventricular relaxation (grade 1 diastolic dysfunction). - Aortic valve: Trileaflet; mildly thickened, mildly calcified   leaflets. - Left atrium: The atrium was mildly dilated.  Patient Profile     61 y.o. male with a history of multivessel CAD status post CABG with documented graft disease and multivessel PCI in August of this year, hypertension, hyperlipidemia, type 2 diabetes mellitus, and iron deficiency anemia with chronic GI blood loss.  He is admitted with unstable angina and mild troponin elevations.  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  Plan is ultimately for a follow-up cardiac  catheterization.  Assessment & Plan    1.  Unstable angina with mild troponin I elevation to 0.27.  No recurrent chest pain this morning, but has had frequent symptoms recently on medical therapy.  2.  Multivessel CAD status post CABG with graft disease and multivessel PCI in August of this year.  He is on aspirin and Plavix.   3.  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  Hemoglobin yesterday 8.7 following PRBC transfusion.  Was also treated with IV iron infusion.  Hemoglobin up to 9.6 today.  4.  Hyperlipidemia, LDL 36 on statin therapy.  5.  Essential hypertension.  Reviewed chart and plan by Dr. Marlou Porch including subsequent evaluation by gastroenterology.  Anticipate diagnostic cardiac catheterization on Monday.  Follow-up CBC tomorrow.  Continue aspirin, Plavix, Lipitor, Imdur, lisinopril, Toprol-XL, and Ranexa.  Signed, Rozann Lesches, MD  08/13/2017, 11:18 AM

## 2017-08-14 DIAGNOSIS — I25110 Atherosclerotic heart disease of native coronary artery with unstable angina pectoris: Principal | ICD-10-CM

## 2017-08-14 LAB — BASIC METABOLIC PANEL
Anion gap: 6 (ref 5–15)
BUN: 14 mg/dL (ref 6–20)
CHLORIDE: 104 mmol/L (ref 101–111)
CO2: 25 mmol/L (ref 22–32)
Calcium: 9.1 mg/dL (ref 8.9–10.3)
Creatinine, Ser: 1.25 mg/dL — ABNORMAL HIGH (ref 0.61–1.24)
GFR calc Af Amer: >60 mL/min (ref >60)
GFR calc non Af Amer: >60 mL/min (ref >60)
Glucose, Bld: 154 mg/dL — ABNORMAL HIGH (ref 65–99)
POTASSIUM: 3.7 mmol/L (ref 3.5–5.1)
SODIUM: 135 mmol/L (ref 135–145)

## 2017-08-14 LAB — CBC
HCT: 31.1 % — ABNORMAL LOW (ref 39.0–52.0)
HEMOGLOBIN: 9.5 g/dL — AB (ref 13.0–17.0)
MCH: 21.9 pg — AB (ref 26.0–34.0)
MCHC: 30.5 g/dL (ref 30.0–36.0)
MCV: 71.7 fL — ABNORMAL LOW (ref 78.0–100.0)
PLATELETS: 234 10*3/uL (ref 150–400)
RBC: 4.34 MIL/uL (ref 4.22–5.81)
RDW: 18.2 % — AB (ref 11.5–15.5)
WBC: 5.8 10*3/uL (ref 4.0–10.5)

## 2017-08-14 LAB — GLUCOSE, CAPILLARY
GLUCOSE-CAPILLARY: 134 mg/dL — AB (ref 65–99)
GLUCOSE-CAPILLARY: 102 mg/dL — AB (ref 65–99)
Glucose-Capillary: 212 mg/dL — ABNORMAL HIGH (ref 65–99)
Glucose-Capillary: 129 mg/dL — ABNORMAL HIGH (ref 65–99)

## 2017-08-14 MED ORDER — SODIUM CHLORIDE 0.9 % WEIGHT BASED INFUSION
1.0000 mL/kg/h | INTRAVENOUS | Status: DC
  Administered 2017-08-14 – 2017-08-15 (×2): 1 mL/kg/h via INTRAVENOUS

## 2017-08-14 MED ORDER — ASPIRIN 81 MG PO CHEW
81.0000 mg | CHEWABLE_TABLET | ORAL | Status: AC
  Administered 2017-08-15: 81 mg via ORAL
  Filled 2017-08-14: qty 1

## 2017-08-14 MED ORDER — SODIUM CHLORIDE 0.9 % WEIGHT BASED INFUSION
3.0000 mL/kg/h | INTRAVENOUS | Status: AC
  Administered 2017-08-14: 3 mL/kg/h via INTRAVENOUS

## 2017-08-14 MED ORDER — SODIUM CHLORIDE 0.9% FLUSH
3.0000 mL | Freq: Two times a day (BID) | INTRAVENOUS | Status: DC
  Administered 2017-08-14 – 2017-08-15 (×3): 3 mL via INTRAVENOUS

## 2017-08-14 MED ORDER — SODIUM CHLORIDE 0.9 % IV SOLN: 250.0000 mL | INTRAVENOUS | Status: DC | PRN

## 2017-08-14 MED ORDER — SODIUM CHLORIDE 0.9% FLUSH: 3.0000 mL | INTRAVENOUS | Status: DC | PRN

## 2017-08-14 NOTE — Plan of Care (Signed)
Patient is independent, alert, and did not complained of any Cp.  Status is NPO due to stress test being done this morning.  BP have been soft throughout the night SBP int the low 100s.

## 2017-08-14 NOTE — Progress Notes (Signed)
Progress Note  Patient Name: Jason Velasquez Date of Encounter: 08/14/2017  Primary Cardiologist: Dr. Skeet Latch  Subjective   No chest pain or shortness of breath.  No palpitations.  Inpatient Medications    Scheduled Meds: . aspirin EC  81 mg Oral Daily  . atorvastatin  40 mg Oral QPM  . clopidogrel  75 mg Oral Q breakfast  . isosorbide mononitrate  60 mg Oral Daily  . lisinopril  10 mg Oral Daily  . metoprolol succinate  100 mg Oral Daily  . pantoprazole  40 mg Oral BID AC  . ranolazine  1,000 mg Oral BID   Continuous Infusions: . ferumoxytol Stopped (08/12/17 2051)   PRN Meds: acetaminophen, nitroGLYCERIN, ondansetron (ZOFRAN) IV   Vital Signs    Vitals:   08/13/17 2125 08/14/17 0544 08/14/17 0755 08/14/17 0800  BP: 116/74 (!) 142/80    Pulse: 75 77 83 85  Resp: 13 12 17 17   Temp: 98.5 F (36.9 C) 98 F (36.7 C)    TempSrc: Oral Oral    SpO2: 100% 96% 100% 100%  Weight:      Height:        Intake/Output Summary (Last 24 hours) at 08/14/2017 1014 Last data filed at 08/14/2017 0809 Gross per 24 hour  Intake 240 ml  Output 750 ml  Net -510 ml   Filed Weights   08/12/17 0500 08/12/17 1327 08/13/17 0606  Weight: 210 lb 8 oz (95.5 kg) 210 lb (95.3 kg) 204 lb 11.2 oz (92.9 kg)    Telemetry    Sinus rhythm.  Personally reviewed.  Physical Exam   GEN: No acute distress.   Neck: No JVD. Cardiac: RRR, no murmur, rub, or gallop.  Respiratory: Nonlabored. Clear to auscultation bilaterally. GI: Soft, nontender, bowel sounds present. MS: No edema; No deformity. Neuro:  Nonfocal. Psych: Alert and oriented x 3. Normal affect.  Labs    Chemistry Recent Labs  Lab 08/11/17 0942 08/12/17 0334 08/14/17 0021  NA 135 137 135  K 4.0 4.1 3.7  CL 106 107 104  CO2 21* 22 25  GLUCOSE 104* 91 154*  BUN 14 12 14   CREATININE 1.16 1.10 1.25*  CALCIUM 9.0 8.8* 9.1  GFRNONAA >60 >60 >60  GFRAA >60 >60 >60  ANIONGAP 8 8 6      Hematology Recent Labs   Lab 08/12/17 0709 08/13/17 1011 08/14/17 0021  WBC 5.4 5.7 5.8  RBC 4.16* 4.38 4.34  HGB 8.7* 9.6* 9.5*  HCT 30.0* 31.4* 31.1*  MCV 72.1* 71.7* 71.7*  MCH 20.9* 21.9* 21.9*  MCHC 29.0* 30.6 30.5  RDW 17.8* 17.9* 18.2*  PLT 209 227 234    Cardiac Enzymes Recent Labs  Lab 08/11/17 1254 08/12/17 0019  TROPONINI 0.27* 0.26*   No results for input(s): TROPIPOC in the last 168 hours.    Radiology    No results found.  Cardiac Studies   Echocardiogram 06/01/2017: Study Conclusions  - Procedure narrative: Transthoracic echocardiography. Image quality was poor. Intravenous contrast (Definity) was administered. - Left ventricle: The cavity size was normal. There was mild focal basal hypertrophy of the septum. Systolic function was vigorous. The estimated ejection fraction was in the range of 65% to 70%. Wall motion was normal; there were no regional wall motion abnormalities. Doppler parameters are consistent with abnormal left ventricular relaxation (grade 1 diastolic dysfunction). - Aortic valve: Trileaflet; mildly thickened, mildly calcified leaflets. - Left atrium: The atrium was mildly dilated.  Patient Profile  61 y.o. male with a history of multivessel CAD status post CABG with documented graft disease and multivessel PCI in August of this year, hypertension, hyperlipidemia, type 2 diabetes mellitus, and iron deficiency anemia with chronic GI blood loss.  He is admitted with unstable angina and mild troponin elevations.  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  Plan is ultimately for a follow-up cardiac catheterization.  Assessment & Plan    1.  Unstable angina with mild troponin I elevation up to 0.27.  No recurrent chest pain in over 24 hours on medical therapy.  2.  Multivessel CAD status post CABG with graft disease and multivessel PCI in August  of this year.  He continues on aspirin and Plavix.  Plan is for diagnostic cardiac catheterization tomorrow.  3.  Iron deficiency anemia with chronic GI blood loss.  AVMs were documented in the stomach and duodenum, both regions treated and not actively bleeding by recent EGD.  Hemoglobin remained stable following PRBC transfusion.  He also got an iron transfusion.  GI has seen and signed off.  4.  Hyperlipidemia, continues on statin therapy with low LDL.  5.  Essential hypertension.  Patient is on the schedule for a diagnostic cardiac catheterization tomorrow, orders placed.  Continue aspirin, Plavix, Lipitor, Imdur, Toprol-XL, lisinopril, and Ranexa.  Signed, Rozann Lesches, MD  08/14/2017, 10:14 AM

## 2017-08-14 NOTE — H&P (View-Only) (Signed)
Progress Note  Patient Name: Jason Velasquez Date of Encounter: 08/14/2017  Primary Cardiologist: Dr. Skeet Latch  Subjective   No chest pain or shortness of breath.  No palpitations.  Inpatient Medications    Scheduled Meds: . aspirin EC  81 mg Oral Daily  . atorvastatin  40 mg Oral QPM  . clopidogrel  75 mg Oral Q breakfast  . isosorbide mononitrate  60 mg Oral Daily  . lisinopril  10 mg Oral Daily  . metoprolol succinate  100 mg Oral Daily  . pantoprazole  40 mg Oral BID AC  . ranolazine  1,000 mg Oral BID   Continuous Infusions: . ferumoxytol Stopped (08/12/17 2051)   PRN Meds: acetaminophen, nitroGLYCERIN, ondansetron (ZOFRAN) IV   Vital Signs    Vitals:   08/13/17 2125 08/14/17 0544 08/14/17 0755 08/14/17 0800  BP: 116/74 (!) 142/80    Pulse: 75 77 83 85  Resp: 13 12 17 17   Temp: 98.5 F (36.9 C) 98 F (36.7 C)    TempSrc: Oral Oral    SpO2: 100% 96% 100% 100%  Weight:      Height:        Intake/Output Summary (Last 24 hours) at 08/14/2017 1014 Last data filed at 08/14/2017 0809 Gross per 24 hour  Intake 240 ml  Output 750 ml  Net -510 ml   Filed Weights   08/12/17 0500 08/12/17 1327 08/13/17 0606  Weight: 210 lb 8 oz (95.5 kg) 210 lb (95.3 kg) 204 lb 11.2 oz (92.9 kg)    Telemetry    Sinus rhythm.  Personally reviewed.  Physical Exam   GEN: No acute distress.   Neck: No JVD. Cardiac: RRR, no murmur, rub, or gallop.  Respiratory: Nonlabored. Clear to auscultation bilaterally. GI: Soft, nontender, bowel sounds present. MS: No edema; No deformity. Neuro:  Nonfocal. Psych: Alert and oriented x 3. Normal affect.  Labs    Chemistry Recent Labs  Lab 08/11/17 0942 08/12/17 0334 08/14/17 0021  NA 135 137 135  K 4.0 4.1 3.7  CL 106 107 104  CO2 21* 22 25  GLUCOSE 104* 91 154*  BUN 14 12 14   CREATININE 1.16 1.10 1.25*  CALCIUM 9.0 8.8* 9.1  GFRNONAA >60 >60 >60  GFRAA >60 >60 >60  ANIONGAP 8 8 6      Hematology Recent Labs   Lab 08/12/17 0709 08/13/17 1011 08/14/17 0021  WBC 5.4 5.7 5.8  RBC 4.16* 4.38 4.34  HGB 8.7* 9.6* 9.5*  HCT 30.0* 31.4* 31.1*  MCV 72.1* 71.7* 71.7*  MCH 20.9* 21.9* 21.9*  MCHC 29.0* 30.6 30.5  RDW 17.8* 17.9* 18.2*  PLT 209 227 234    Cardiac Enzymes Recent Labs  Lab 08/11/17 1254 08/12/17 0019  TROPONINI 0.27* 0.26*   No results for input(s): TROPIPOC in the last 168 hours.    Radiology    No results found.  Cardiac Studies   Echocardiogram 06/01/2017: Study Conclusions  - Procedure narrative: Transthoracic echocardiography. Image quality was poor. Intravenous contrast (Definity) was administered. - Left ventricle: The cavity size was normal. There was mild focal basal hypertrophy of the septum. Systolic function was vigorous. The estimated ejection fraction was in the range of 65% to 70%. Wall motion was normal; there were no regional wall motion abnormalities. Doppler parameters are consistent with abnormal left ventricular relaxation (grade 1 diastolic dysfunction). - Aortic valve: Trileaflet; mildly thickened, mildly calcified leaflets. - Left atrium: The atrium was mildly dilated.  Patient Profile  61 y.o. male with a history of multivessel CAD status post CABG with documented graft disease and multivessel PCI in August of this year, hypertension, hyperlipidemia, type 2 diabetes mellitus, and iron deficiency anemia with chronic GI blood loss.  He is admitted with unstable angina and mild troponin elevations.  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  Plan is ultimately for a follow-up cardiac catheterization.  Assessment & Plan    1.  Unstable angina with mild troponin I elevation up to 0.27.  No recurrent chest pain in over 24 hours on medical therapy.  2.  Multivessel CAD status post CABG with graft disease and multivessel PCI in August  of this year.  He continues on aspirin and Plavix.  Plan is for diagnostic cardiac catheterization tomorrow.  3.  Iron deficiency anemia with chronic GI blood loss.  AVMs were documented in the stomach and duodenum, both regions treated and not actively bleeding by recent EGD.  Hemoglobin remained stable following PRBC transfusion.  He also got an iron transfusion.  GI has seen and signed off.  4.  Hyperlipidemia, continues on statin therapy with low LDL.  5.  Essential hypertension.  Patient is on the schedule for a diagnostic cardiac catheterization tomorrow, orders placed.  Continue aspirin, Plavix, Lipitor, Imdur, Toprol-XL, lisinopril, and Ranexa.  Signed, Rozann Lesches, MD  08/14/2017, 10:14 AM

## 2017-08-14 NOTE — Plan of Care (Signed)
  Clinical Measurements: Ability to maintain clinical measurements within normal limits will improve 08/14/2017 2238 - Progressing by Irish Lack, RN   Clinical Measurements: Cardiovascular complication will be avoided 08/14/2017 2238 - Progressing by Irish Lack, RN   Pain Managment: General experience of comfort will improve 08/14/2017 2238 - Progressing by Irish Lack, RN   Cardiac: Ability to achieve and maintain adequate cardiovascular perfusion will improve 08/14/2017 2238 - Progressing by Irish Lack, RN

## 2017-08-15 ENCOUNTER — Encounter (HOSPITAL_COMMUNITY): Payer: Self-pay | Admitting: Internal Medicine

## 2017-08-15 ENCOUNTER — Encounter (HOSPITAL_COMMUNITY)
Admission: RE | Disposition: A | Discharge status: Home / Self Care | Payer: Self-pay | Source: Ambulatory Visit | Attending: Cardiovascular Disease

## 2017-08-15 ENCOUNTER — Other Ambulatory Visit: Payer: Self-pay | Admitting: Cardiology

## 2017-08-15 DIAGNOSIS — D649 Anemia, unspecified: Secondary | ICD-10-CM

## 2017-08-15 DIAGNOSIS — Q2730 Arteriovenous malformation, site unspecified: Secondary | ICD-10-CM

## 2017-08-15 DIAGNOSIS — I214 Non-ST elevation (NSTEMI) myocardial infarction: Secondary | ICD-10-CM

## 2017-08-15 HISTORY — DX: Arteriovenous malformation, site unspecified: Q27.30

## 2017-08-15 HISTORY — PX: LEFT HEART CATH AND CORS/GRAFTS ANGIOGRAPHY: CATH118250

## 2017-08-15 LAB — BASIC METABOLIC PANEL
Anion gap: 8 (ref 5–15)
BUN: 15 mg/dL (ref 6–20)
CHLORIDE: 104 mmol/L (ref 101–111)
CO2: 23 mmol/L (ref 22–32)
CREATININE: 1.15 mg/dL (ref 0.61–1.24)
Calcium: 9 mg/dL (ref 8.9–10.3)
GFR calc non Af Amer: >60 mL/min (ref >60)
Glucose, Bld: 149 mg/dL — ABNORMAL HIGH (ref 65–99)
POTASSIUM: 3.7 mmol/L (ref 3.5–5.1)
Sodium: 135 mmol/L (ref 135–145)

## 2017-08-15 LAB — CBC
HEMATOCRIT: 31.8 % — AB (ref 39.0–52.0)
HEMOGLOBIN: 9.6 g/dL — AB (ref 13.0–17.0)
MCH: 21.6 pg — AB (ref 26.0–34.0)
MCHC: 30.2 g/dL (ref 30.0–36.0)
MCV: 71.6 fL — AB (ref 78.0–100.0)
PLATELETS: 216 10*3/uL (ref 150–400)
RBC: 4.44 MIL/uL (ref 4.22–5.81)
RDW: 18.1 % — ABNORMAL HIGH (ref 11.5–15.5)
WBC: 6.4 10*3/uL (ref 4.0–10.5)

## 2017-08-15 LAB — GLUCOSE, CAPILLARY
GLUCOSE-CAPILLARY: 114 mg/dL — AB (ref 65–99)
Glucose-Capillary: 134 mg/dL — ABNORMAL HIGH (ref 65–99)
Glucose-Capillary: 158 mg/dL — ABNORMAL HIGH (ref 65–99)

## 2017-08-15 SURGERY — LEFT HEART CATH AND CORS/GRAFTS ANGIOGRAPHY
Anesthesia: LOCAL

## 2017-08-15 SURGERY — LEFT HEART CATH AND CORONARY ANGIOGRAPHY
Anesthesia: LOCAL

## 2017-08-15 MED ORDER — PANTOPRAZOLE SODIUM 40 MG PO TBEC: 40.0000 mg | DELAYED_RELEASE_TABLET | Freq: Two times a day (BID) | ORAL | 2 refills | Status: DC

## 2017-08-15 MED ORDER — IOPAMIDOL (ISOVUE-370) INJECTION 76%
INTRAVENOUS | Status: DC | PRN
  Administered 2017-08-15: 90 mL via INTRA_ARTERIAL

## 2017-08-15 MED ORDER — MIDAZOLAM HCL 2 MG/2ML IJ SOLN
INTRAMUSCULAR | Status: DC | PRN
  Administered 2017-08-15: 1 mg via INTRAVENOUS

## 2017-08-15 MED ORDER — LIDOCAINE HCL (PF) 1 % IJ SOLN
INTRAMUSCULAR | Status: DC | PRN
  Administered 2017-08-15: 3 mL

## 2017-08-15 MED ORDER — VERAPAMIL HCL 2.5 MG/ML IV SOLN
INTRAVENOUS | Status: DC | PRN
  Administered 2017-08-15: 10 mL via INTRA_ARTERIAL

## 2017-08-15 MED ORDER — HEPARIN SODIUM (PORCINE) 1000 UNIT/ML IJ SOLN
INTRAMUSCULAR | Status: AC
  Filled 2017-08-15: qty 1

## 2017-08-15 MED ORDER — HEPARIN SODIUM (PORCINE) 1000 UNIT/ML IJ SOLN
INTRAMUSCULAR | Status: DC | PRN
  Administered 2017-08-15: 5000 [IU] via INTRAVENOUS

## 2017-08-15 MED ORDER — HEPARIN (PORCINE) IN NACL 2-0.9 UNIT/ML-% IJ SOLN
INTRAMUSCULAR | Status: AC | PRN
  Administered 2017-08-15: 1000 mL

## 2017-08-15 MED ORDER — HEPARIN (PORCINE) IN NACL 2-0.9 UNIT/ML-% IJ SOLN
INTRAMUSCULAR | Status: AC
  Filled 2017-08-15: qty 1000

## 2017-08-15 MED ORDER — LIDOCAINE HCL (PF) 1 % IJ SOLN
INTRAMUSCULAR | Status: AC
  Filled 2017-08-15: qty 30

## 2017-08-15 MED ORDER — ACETAMINOPHEN 325 MG PO TABS: 650.0000 mg | ORAL_TABLET | ORAL | Status: DC | PRN

## 2017-08-15 MED ORDER — METFORMIN HCL 1000 MG PO TABS: 1000.0000 mg | ORAL_TABLET | Freq: Two times a day (BID) | ORAL | 11 refills | Status: DC

## 2017-08-15 MED ORDER — MIDAZOLAM HCL 2 MG/2ML IJ SOLN
INTRAMUSCULAR | Status: AC
  Filled 2017-08-15: qty 2

## 2017-08-15 MED ORDER — IOPAMIDOL (ISOVUE-370) INJECTION 76%
INTRAVENOUS | Status: AC
  Filled 2017-08-15: qty 125

## 2017-08-15 MED ORDER — FENTANYL CITRATE (PF) 100 MCG/2ML IJ SOLN
INTRAMUSCULAR | Status: AC
  Filled 2017-08-15: qty 2

## 2017-08-15 MED ORDER — FENTANYL CITRATE (PF) 100 MCG/2ML IJ SOLN
INTRAMUSCULAR | Status: DC | PRN
  Administered 2017-08-15: 25 ug via INTRAVENOUS

## 2017-08-15 MED ORDER — VERAPAMIL HCL 2.5 MG/ML IV SOLN
INTRAVENOUS | Status: AC
  Filled 2017-08-15: qty 2

## 2017-08-15 SURGICAL SUPPLY — 11 items
CATH INFINITI 5 FR IM (CATHETERS) ×1 IMPLANT
CATH INFINITI 5 FR JL3.5 (CATHETERS) ×1 IMPLANT
CATH INFINITI 5FR MULTPACK ANG (CATHETERS) ×1 IMPLANT
DEVICE RAD COMP TR BAND LRG (VASCULAR PRODUCTS) ×1 IMPLANT
GLIDESHEATH SLEND SS 6F .021 (SHEATH) ×1 IMPLANT
GUIDEWIRE INQWIRE 1.5J.035X260 (WIRE) IMPLANT
INQWIRE 1.5J .035X260CM (WIRE) ×2
KIT HEART LEFT (KITS) ×2 IMPLANT
PACK CARDIAC CATHETERIZATION (CUSTOM PROCEDURE TRAY) ×2 IMPLANT
TRANSDUCER W/STOPCOCK (MISCELLANEOUS) ×2 IMPLANT
TUBING CIL FLEX 10 FLL-RA (TUBING) ×2 IMPLANT

## 2017-08-15 NOTE — Discharge Instructions (Signed)
Call [facility] at [phone] if any bleeding, swelling or drainage at cath site.  May shower, no tub baths for 48 hours for groin sticks. No lifting over 5 pounds for 3 days.  No Driving for 3 days  You will need another dose of IV Iron   Continue protonix twice a day for now.   Heart Healthy Diabetic diet  Do not take metformin until 08/18/17 - it may interact with cath dye.    Have lab work done on 08/22/18 at [facility] lab on first floor.

## 2017-08-15 NOTE — Interval H&P Note (Signed)
History and Physical Interval Note:  08/15/2017 8:37 AM  Jason Velasquez  has presented today for cardiac cath with the diagnosis of nstemi  The various methods of treatment have been discussed with the patient and family. After consideration of risks, benefits and other options for treatment, the patient has consented to  Procedure(s): LEFT HEART CATH AND CORS/GRAFTS ANGIOGRAPHY (N/A) as a surgical intervention .  The patient's history has been reviewed, patient examined, no change in status, stable for surgery.  I have reviewed the patient's chart and labs.  Questions were answered to the patient's satisfaction.    Cath Lab Visit (complete for each Cath Lab visit)  Clinical Evaluation Leading to the Procedure:   ACS: Yes.    Non-ACS:    Anginal Classification: CCS III  Anti-ischemic medical therapy: Maximal Therapy (2 or more classes of medications)  Non-Invasive Test Results: No non-invasive testing performed  Prior CABG: Previous CABG         Lauree Chandler

## 2017-08-15 NOTE — Progress Notes (Signed)
Progress Note  Patient Name: Jason Velasquez Date of Encounter: 08/15/2017  Primary Cardiologist: Dr. Skeet Latch  Subjective   Patient endorses mild substernal chest pain this morning prior to cath that resolved on its own. He denies shortness of breath.   Inpatient Medications    Scheduled Meds: . aspirin EC  81 mg Oral Daily  . atorvastatin  40 mg Oral QPM  . clopidogrel  75 mg Oral Q breakfast  . isosorbide mononitrate  60 mg Oral Daily  . lisinopril  10 mg Oral Daily  . metoprolol succinate  100 mg Oral Daily  . pantoprazole  40 mg Oral BID AC  . ranolazine  1,000 mg Oral BID  . sodium chloride flush  3 mL Intravenous Q12H   Continuous Infusions: . sodium chloride    . sodium chloride 1 mL/kg/hr (08/15/17 0759)  . ferumoxytol Stopped (08/12/17 2051)   PRN Meds: sodium chloride, acetaminophen, nitroGLYCERIN, ondansetron (ZOFRAN) IV, sodium chloride flush   Vital Signs    Vitals:   08/14/17 0800 08/14/17 1407 08/14/17 2013 08/15/17 0500  BP:  103/64 (!) 65/60 128/67  Pulse: 85 72 77 74  Resp: 17 15 16 16   Temp:  97.7 F (36.5 C) 98.4 F (36.9 C) 98.1 F (36.7 C)  TempSrc:  Oral Oral Oral  SpO2: 100% 96% 99% 97%  Weight:    205 lb 14.6 oz (93.4 kg)  Height:        Intake/Output Summary (Last 24 hours) at 08/15/2017 0836 Last data filed at 08/15/2017 0500 Gross per 24 hour  Intake 1455.37 ml  Output -  Net 1455.37 ml   Filed Weights   08/12/17 1327 08/13/17 0606 08/15/17 0500  Weight: 210 lb (95.3 kg) 204 lb 11.2 oz (92.9 kg) 205 lb 14.6 oz (93.4 kg)    Telemetry    SR mostly; noted to have 1st degree AV block this AM - Personally Reviewed  ECG    N/a  Physical Exam   GEN: No acute distress.   Neck: No JVD Cardiac: RRR, no murmurs, rubs, or gallops.  Respiratory: Clear to auscultation bilaterally on anterior lung fields. GI: Soft, nontender, non-distended  MS: No edema; No deformity. Neuro:  Nonfocal  Psych: Normal affect   Labs      Chemistry Recent Labs  Lab 08/12/17 0334 08/14/17 0021 08/15/17 0351  NA 137 135 135  K 4.1 3.7 3.7  CL 107 104 104  CO2 22 25 23   GLUCOSE 91 154* 149*  BUN 12 14 15   CREATININE 1.10 1.25* 1.15  CALCIUM 8.8* 9.1 9.0  GFRNONAA >60 >60 >60  GFRAA >60 >60 >60  ANIONGAP 8 6 8      Hematology Recent Labs  Lab 08/13/17 1011 08/14/17 0021 08/15/17 0350  WBC 5.7 5.8 6.4  RBC 4.38 4.34 4.44  HGB 9.6* 9.5* 9.6*  HCT 31.4* 31.1* 31.8*  MCV 71.7* 71.7* 71.6*  MCH 21.9* 21.9* 21.6*  MCHC 30.6 30.5 30.2  RDW 17.9* 18.2* 18.1*  PLT 227 234 216    Cardiac Enzymes Recent Labs  Lab 08/11/17 1254 08/12/17 0019  TROPONINI 0.27* 0.26*   No results for input(s): TROPIPOC in the last 168 hours.   BNPNo results for input(s): BNP, PROBNP in the last 168 hours.   DDimer No results for input(s): DDIMER in the last 168 hours.   Radiology    No results found.  Cardiac Studies   Echocardiogram 06/01/2017: Study Conclusions  - Procedure narrative: Transthoracic echocardiography. Image quality  was poor. Intravenous contrast (Definity) was administered. - Left ventricle: The cavity size was normal. There was mild focal basal hypertrophy of the septum. Systolic function was vigorous. The estimated ejection fraction was in the range of 65% to 70%. Wall motion was normal; there were no regional wall motion abnormalities. Doppler parameters are consistent with abnormal left ventricular relaxation (grade 1 diastolic dysfunction). - Aortic valve: Trileaflet; mildly thickened, mildly calcified leaflets. - Left atrium: The atrium was mildly dilated  LHC 05/25/2017:  Lesion #3: DLM lesion, 90 %stenosed.Ost Cx lesion, 95 %stenosed. Prox Cx lesion, 90 %stenosed. --> covered with 1 stent.  A STENT SYNERGY DES 3X16 drug eluting stent was successfully placed. Postdilated in the left main to 3.6 mm and in the proximal circumflex up to 3.4 mm.  Post intervention, there  is a 0% residual stenosis.  Lesion #4 Ost LAD lesion, 85 %stenosed.  Post intervention, [employer]oon PTCA only (through the left main-circumflex stent there is a 30% residual stenosis.  Lesion #2: Ost 1st Mrg to 1st Mrg lesion, 75 %stenosed.  A STENT SYNERGY DES 2.25X20 drug eluting stent was successfully placed. -With the proximal portion stented through. -  Post intervention, there is a 0% residual stenosis - in the majority a stent, with the ostial portion as roughly 50% stenosis.  Lesion 1: Mid Cx lesion, 95 %stenosed.  A STENT SYNERGY DES 2.25X16 drug eluting stent was successfully placed.  Post intervention, there is a 0% residual stenosis.  3rd Mrg lesion, 100 %stenosed.  Mid LAD lesion, 100 %stenosed  LHC 08/15/2017: 1. Severe triple vessel CAD s/p CABG with 1/3 patent bypass grafts.  2. The LAD is known to be chronically occluded in the mid segment. The mid and distal LAD fills from the patent LIMA graft. The ostial LAD is jailed by the stent extending from the left main into the Circumflex. The proximal LAD has a 99% stenosis following by an aneurysmal segment then a total occlusion.  3. The left main stent is patent 4. The ostial/proximal Circumflex stent is patent. The first OM stent is patent. There is a 60% ostial stenosis in the first OM branch just prior to the stent. The SVG to the OM is known to be occluded. The mid Circumflex stent is patent.  5. The native RCA has a long 70% proximal stenosis and 100% mid chronic occlusion. The vein graft the to distal RCA is known to be occluded and was not injected.  6. Normal filling pressures  Patient Profile     61 y.o. male with a history of multivessel CAD status post CABG with documented graft disease and multivessel PCIinAugustofthis year, hypertension, hyperlipidemia, type 2 diabetes mellitus, and iron deficiency anemia with chronic GI blood loss. He is admitted with unstable angina and mild troponin  elevations. 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   Assessment & Plan    Unstable angina Mildly elevated troponin on admission to 0.27. EKG on admission without ischemic changes. Cath with severe triple vessel disease s/p CABG with 1/3 patent bypass grafts and otherwise diffuse CAD with recommendations for medical management. Chest pain on admission likely precipitated by anemia in setting of his diffuse CAD.  --asa, plavix, lipitor, imdur, metoprolol, lisinopril, ranexa  Multivessel CAD s/p CABG with graft disease and multivessel PCI 8/18. --continue ASA, plavix, lipitor, imdur, metoprolol succinate, lisinopril  Fe-deficiency anemia 2/2 chronic GI bleed from AVMs He is  s/p EGD and had treatment of stomach and duodenal AVMs though not actively bleeding on study. He is s/p 2u pRBCs this admission and s/p ferraheme. Hgb stable at 9.6 today. GI s/o - appreciate their assistance.  HLD --continue lipitor  HTN Stable. --continue current therapy.  For questions or updates, please contact [facility] Please consult [email or web address] for contact info under Cardiology/STEMI.   Signed, Alphonzo Grieve, MD  08/15/2017, 8:36 AM    Patient examined chart reviewed Cath with stable post graft disease Medical Rx Right radial sight ok D/c home with outpatient f/u Dr Ronald Lobo

## 2017-08-15 NOTE — Care Management Note (Signed)
Case Management Note  Patient Details  Name: Jason Velasquez MRN: [id number] Date of Birth: 03/12/1956  Subjective/Objective:         Pt presented for CP and from home alone.  Pt uninsured and not working. Pt states he has used [facility], [facility], and [facility] for meds but has some meds that are as much as $30/month.  Pt applying for disability and plans to also apply for Medicaid upon d/c.  Pt has spoken with financial counselor and has business card and understand to call number for assistance. Pt sees PMD at Internal Medicine, Dr. Heber . Pt on Ran[employer] and has assistance from drug company. Pt independent and drives self to pharmacy and appointments.   Action/Plan: Explained [place] and other local clinics that can use [facility] pharmacy.  Pt does not wish to change PMD.  Spoke with Internal Medicine clinic to verify upcoming appt on 08/30/17 at 11:45.  Also spoke with pharmacist to verify that pt has active [place] card.  Pharmacist encouraged that pt sign up for MAP at [facility]. Pt aware of this and states he will try to do that upon discharge. Advised pt to contact us with other needs. No other CM needs at this time.  Expected Discharge Date:                  Expected Discharge Plan:  Home/Self Care  In-House Referral:  NA  Discharge planning Services  CM Consult  Post Acute Care Choice:  NA Choice offered to:  NA  DME Arranged:  N/A DME Agency:  NA  HH Arranged:  NA HH Agency:  NA  Status of Service:  Completed, signed off  If discussed at [place] of Stay Meetings, dates discussed:    Additional Comments:  Arley Phenix, RN 08/15/2017, 12:31 PM

## 2017-08-15 NOTE — Discharge Summary (Signed)
Discharge Summary    Patient ID: Jason Velasquez,  MRN: [id number], DOB/AGE: 1955-12-06 61 y.o.  Admit date: 08/11/2017 Discharge date: 08/15/2017  Primary Care Provider: [name] Primary Cardiologist: Dr. Oval Linsey  Discharge Diagnoses    Principal Problem:   Progressive angina [facility]) Active Problems:   Microcytic anemia   S/P CABG (coronary artery bypass graft)   Coronary artery disease involving coronary bypass graft of native heart with angina pectoris [facility])   Chest pain   Angiodysplasia of stomach and duodenum   Non-ST elevation (NSTEMI) myocardial infarction ([place])   AVM (arteriovenous malformation)   Allergies No Known Allergies  Diagnostic Studies/Procedures    EGD  08/12/17  Status post treatment of 2 gastric and one duodenal angioectasia/angiodysplasia Continue daily PPI  EGD yesterday with finding of  nonbleeding AVMs in the stomach which were injected and treated with APC, also one AVM in the duodenum also treated with APC and endoclipped  Cardiac Cath 08/15/17  Procedures   LEFT HEART CATH AND CORS/GRAFTS ANGIOGRAPHY  Conclusion     Mid RCA lesion is 100% stenosed.  Ost RCA to Prox RCA lesion is 70% stenosed.  Ost 1st Mrg lesion is 60% stenosed.  Previously placed Ost 1st Mrg to 1st Mrg stent (unknown type) is widely patent.  Origin lesion is 100% stenosed.  Origin lesion is 100% stenosed.  Prox Graft lesion is 20% stenosed.  Prox LAD to Mid LAD lesion is 100% stenosed.  Previously placed Ost Cx to Prox Cx stent (unknown type) is widely patent.  Previously placed Ost LM to Dist LM stent (unknown type) is widely patent.  Previously placed Prox Cx to Mid Cx stent (unknown type) is widely patent.  SVG graft was not visualized.  SVG graft was not visualized.  Post intervention, there is a -1% residual stenosis.  LIMA graft was visualized by angiography and is normal in caliber.  The graft exhibits minimal luminal  irregularities.  Post intervention, there is a -1% residual stenosis.  Ost LAD to Prox LAD lesion is 99% stenosed.  Post intervention, there is a -1% residual stenosis.   1. Severe triple vessel CAD s/p CABG with 1/3 patent bypass grafts.  2. The LAD is known to be chronically occluded in the mid segment. The mid and distal LAD fills from the patent LIMA graft. The ostial LAD is jailed by the stent extending from the left main into the Circumflex. The proximal LAD has a 99% stenosis following by an aneurysmal segment then a total occlusion.  3. The left main stent is patent 4. The ostial/proximal Circumflex stent is patent. The first OM stent is patent. There is a 60% ostial stenosis in the first OM branch just prior to the stent. The SVG to the OM is known to be occluded. The mid Circumflex stent is patent.  5. The native RCA has a long 70% proximal stenosis and 100% mid chronic occlusion. The vein graft the to distal RCA is known to be occluded and was not injected.  6. Normal filling pressures  Recommendations: Continue medical management of CAD. No focal targets for PCI. The proximal LAD stenosis is severe but the majority of this territory is supplied by the LIMA graft and the LAD is jailed by the LM/Circ stent. I suspect that his anemia has contributed to his recent chest pain given his severe, diffuse CAD.     Echo 06/01/17  Study Conclusions  - Procedure narrative: Transthoracic echocardiography. Image   quality was  poor. Intravenous contrast (Definity) was   administered. - Left ventricle: The cavity size was normal. There was mild focal   basal hypertrophy of the septum. Systolic function was vigorous.   The estimated ejection fraction was in the range of 65% to 70%.   Wall motion was normal; there were no regional wall motion   abnormalities. Doppler parameters are consistent with abnormal   left ventricular relaxation (grade 1 diastolic dysfunction). - Aortic valve:  Trileaflet; mildly thickened, mildly calcified   leaflets. - Left atrium: The atrium was mildly dilated.  _____________   History of Present Illness     41 YOM with a hx of severe 3 vessel CAD and all his CABG grafts are occluded with the exception of his LIMA to the LAD.  He underwent successful PCI of the LM, LCx and OM1.  He is on optimal medical therapy and now has unstable angina after feeling well initially after PCI.  Dr. Oval Linsey was concerned that he may have in-stent restenosis or thrombosis and sent him to the hospital.  Plan was for Lt cardiac cath on 08/11/17 but his hgb was low at 8.0 so cath held and GI was consulted.  He was admitted as an inpt.  No IV heparin due to acute anemia.     Hospital Course     Consultants: Dr. Hilarie Fredrickson with GI.    Pt underwent EGD on the 9th of Nov.  He did receive IV iron.  To get second one today prior to discharge.  H/H is rising.  Pt had 2 units PRBC.    Troponin pk of 0.27 so pt underwent cath 08/15/17.  Continued medical management of CAD recommended.  No focal targets for PCI.  It is thought the anemia contributed to his recent chest pain with his severe diffuse CAD.    Pt was seen and evaluated by Dr. Johnsie Cancel and found stable for discharge.      Hgb is 9.6 and hct is 31.8.  He should have a H/H in 1 week.  Thought to be demand ischemia with elevated troponin and anemia.    _____________  Discharge Vitals Blood pressure 96/64, pulse 72, temperature 98.1 F (36.7 C), temperature source Oral, resp. rate (!) 36, height 5\' 10"  (1.778 m), weight 205 lb 14.6 oz (93.4 kg), SpO2 100 %.  Filed Weights   08/12/17 1327 08/13/17 0606 08/15/17 0500  Weight: 210 lb (95.3 kg) 204 lb 11.2 oz (92.9 kg) 205 lb 14.6 oz (93.4 kg)    Labs & Radiologic Studies    CBC Recent Labs    08/14/17 0021 08/15/17 0350  WBC 5.8 6.4  HGB 9.5* 9.6*  HCT 31.1* 31.8*  MCV 71.7* 71.6*  PLT 234 462   Basic Metabolic Panel Recent Labs    08/14/17 0021  08/15/17 0351  NA 135 135  K 3.7 3.7  CL 104 104  CO2 25 23  GLUCOSE 154* 149*  BUN 14 15  CREATININE 1.25* 1.15  CALCIUM 9.1 9.0   Liver Function Tests No results for input(s): AST, ALT, ALKPHOS, BILITOT, PROT, ALBUMIN in the last 72 hours. No results for input(s): LIPASE, AMYLASE in the last 72 hours. Cardiac Enzymes No results for input(s): CKTOTAL, CKMB, CKMBINDEX, TROPONINI in the last 72 hours. BNP Invalid input(s): POCBNP D-Dimer No results for input(s): DDIMER in the last 72 hours. Hemoglobin A1C No results for input(s): HGBA1C in the last 72 hours. Fasting Lipid Panel No results for input(s): CHOL, HDL, LDLCALC, TRIG, CHOLHDL,  LDLDIRECT in the last 72 hours. Thyroid Function Tests No results for input(s): TSH, T4TOTAL, T3FREE, THYROIDAB in the last 72 hours.  Invalid input(s): FREET3 _____________  No results found. Disposition   Pt is being discharged home today in good condition.  Follow-up Plans & Appointments    Follow-up Information    Department, [facility] Follow up.   Why:  Apply for Medication Assistance Program to help with cost of medications. Contact information: [place] [address] [phone]        Skeet Latch, MD Follow up on 09/06/2017.   Specialty:  Cardiology Why:  at 3:00 Pm  Contact information: [place] [address] [phone]           Call [facility] at [phone] if any bleeding, swelling or drainage at cath site.  May shower, no tub baths for 48 hours for groin sticks. No lifting over 5 pounds for 3 days.  No Driving for 3 days  You will need another dose of IV Iron   Continue protonix twice a day for now.   Heart Healthy Diabetic diet  Do not take metformin until 08/18/17 - it may interact with cath dye.    Have lab work done on 08/22/18 at [facility] lab on first floor.       Discharge Medications   Current  Discharge Medication List    START taking these medications   Details  acetaminophen (TYLENOL) 325 MG tablet Take 2 tablets (650 mg total) every 4 (four) hours as needed by mouth for headache or mild pain.      CONTINUE these medications which have CHANGED   Details  metFORMIN (GLUCOPHAGE) 1000 MG tablet Take 1 tablet (1,000 mg total) 2 (two) times daily with a meal by mouth. Qty: 60 tablet, Refills: 11    pantoprazole (PROTONIX) 40 MG tablet Take 1 tablet (40 mg total) 2 (two) times daily by mouth. Qty: 60 tablet, Refills: 2      CONTINUE these medications which have NOT CHANGED   Details  aspirin EC 81 MG tablet Take 81 mg by mouth daily.    atorvastatin (LIPITOR) 40 MG tablet Take 1 tablet (40 mg total) by mouth every evening. Qty: 30 tablet, Refills: 11    clopidogrel (PLAVIX) 75 MG tablet Take 1 tablet (75 mg total) by mouth daily with breakfast. Qty: 30 tablet, Refills: 11    Hypromellose (ARTIFICIAL TEARS OP) Apply 1 drop daily as needed to eye (dry eyes).    isosorbide mononitrate (IMDUR) 60 MG 24 hr tablet Take 1 tablet (60 mg total) by mouth daily. Qty: 30 tablet, Refills: 5    lisinopril (PRINIVIL,ZESTRIL) 10 MG tablet Take 1 tablet (10 mg total) by mouth daily. Qty: 90 tablet, Refills: 3    metoprolol succinate (TOPROL XL) 100 MG 24 hr tablet Take 1 tablet (100 mg total) by mouth daily. Take with or immediately following a meal. Qty: 30 tablet, Refills: 11    nitroGLYCERIN (NITROSTAT) 0.4 MG SL tablet Place 1 tablet (0.4 mg total) under the tongue every 5 (five) minutes as needed for chest pain. Qty: 25 tablet, Refills: 5    ranolazine (RANEXA) 1000 MG SR tablet Take 1,000 mg by mouth 2 (two) times daily.    sitaGLIPtin (JANUVIA) 100 MG tablet Take 1 tablet (100 mg total) by mouth daily. Qty: 30 tablet, Refills: 11    Lancets MISC 1 Device by Does not apply route 4 (  four) times daily -  before meals and at bedtime. Qty: 100 each, Refills: 11            Outstanding Labs/Studies   CBC on Monday the 19th.    Duration of Discharge Encounter   Greater than 30 minutes including physician time.  Signed, Cecilie Kicks NP 08/15/2017, 3:25 PM

## 2017-08-30 ENCOUNTER — Other Ambulatory Visit: Payer: Self-pay

## 2017-08-30 ENCOUNTER — Other Ambulatory Visit: Payer: Self-pay | Admitting: *Deleted

## 2017-08-30 ENCOUNTER — Ambulatory Visit (INDEPENDENT_AMBULATORY_CARE_PROVIDER_SITE_OTHER): Payer: No Typology Code available for payment source | Admitting: Internal Medicine

## 2017-08-30 VITALS — BP 122/72 | HR 92 | Temp 98.2°F | Ht 71.0 in | Wt 211.6 lb

## 2017-08-30 DIAGNOSIS — D500 Iron deficiency anemia secondary to blood loss (chronic): Secondary | ICD-10-CM

## 2017-08-30 DIAGNOSIS — D509 Iron deficiency anemia, unspecified: Secondary | ICD-10-CM

## 2017-08-30 DIAGNOSIS — Z87891 Personal history of nicotine dependence: Secondary | ICD-10-CM

## 2017-08-30 DIAGNOSIS — IMO0001 Reserved for inherently not codable concepts without codable children: Secondary | ICD-10-CM

## 2017-08-30 DIAGNOSIS — I25118 Atherosclerotic heart disease of native coronary artery with other forms of angina pectoris: Secondary | ICD-10-CM

## 2017-08-30 DIAGNOSIS — Z7984 Long term (current) use of oral hypoglycemic drugs: Secondary | ICD-10-CM

## 2017-08-30 DIAGNOSIS — E11319 Type 2 diabetes mellitus with unspecified diabetic retinopathy without macular edema: Secondary | ICD-10-CM

## 2017-08-30 DIAGNOSIS — I25119 Atherosclerotic heart disease of native coronary artery with unspecified angina pectoris: Secondary | ICD-10-CM

## 2017-08-30 DIAGNOSIS — K219 Gastro-esophageal reflux disease without esophagitis: Secondary | ICD-10-CM

## 2017-08-30 DIAGNOSIS — Z79899 Other long term (current) drug therapy: Secondary | ICD-10-CM

## 2017-08-30 DIAGNOSIS — K5520 Angiodysplasia of colon without hemorrhage: Secondary | ICD-10-CM

## 2017-08-30 DIAGNOSIS — Z951 Presence of aortocoronary bypass graft: Secondary | ICD-10-CM

## 2017-08-30 LAB — POCT GLYCOSYLATED HEMOGLOBIN (HGB A1C): Hemoglobin A1C: 6.1

## 2017-08-30 LAB — GLUCOSE, CAPILLARY: GLUCOSE-CAPILLARY: 166 mg/dL — AB (ref 65–99)

## 2017-08-30 MED ORDER — SITAGLIPTIN PHOSPHATE 100 MG PO TABS: 100.0000 mg | ORAL_TABLET | Freq: Every day | ORAL | 0 refills | Status: DC

## 2017-08-30 MED ORDER — OMEPRAZOLE 40 MG PO CPDR: 40.0000 mg | DELAYED_RELEASE_CAPSULE | Freq: Every day | ORAL | 2 refills | Status: DC

## 2017-08-30 MED ORDER — METOPROLOL SUCCINATE ER 200 MG PO TB24: 200.0000 mg | ORAL_TABLET | Freq: Every day | ORAL | 2 refills | Status: DC

## 2017-08-30 MED ORDER — METOPROLOL SUCCINATE ER 100 MG PO TB24: 100.0000 mg | ORAL_TABLET | Freq: Every day | ORAL | 0 refills | Status: DC

## 2017-08-30 MED FILL — METOPROLOL SUCC ER 200 MG T: 200 | 30 days supply | Qty: 30 | Fill #0

## 2017-08-30 MED FILL — JANUVIA 100 MG TABLET: 100 | 30 days supply | Qty: 30 | Fill #0

## 2017-08-30 NOTE — Telephone Encounter (Signed)
Needs to re-schedule his appt per Dr Heber [place].

## 2017-08-30 NOTE — Patient Instructions (Signed)
FOLLOW-UP INSTRUCTIONS When: About 3 months For: BP recheck, anemia, angina What to bring: Glucose meter  We increased your metoprolol from 100mg  to 200mg  daily. Continue taking the omeprazole daily for acid suppression.

## 2017-08-30 NOTE — Telephone Encounter (Signed)
He really needs to be seen, he missed his appointment with me today. Will give only 1 month refill.

## 2017-08-30 NOTE — Progress Notes (Signed)
CC: Follow up for angina, anemia, diabetes  HPI:  [name] is a 61 y.o. male with PMHx detailed below presenting for follow up of angina and anemia. He also presents for routine diabetes maintenance.  See problem based assessment and plan below for additional details.  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 He has no signs or symptoms of hyperglycemia and he denies any episodes of symptomatic hypoglycemia. Hgb A1c is 6.1% today reflecting very good control. Assessment Well controlled type 2 diabetes with Hgb A1c below goal <7.0% Plan Continue current medications  Iron deficiency anemia He was felt to have increased anginal symptoms from stable obstructive CAD but blood loss from upper GI AVM source. He has not had further bleeding after endoscopy with treatment of these lesions. His chest pain is not as often as earlier this year but physical activity still provokes this regularly. He does still have heartburn symptoms Plan Checking blood count today for recovery of hemoglobin Continue omeprazole for acid suppression  Coronary artery disease involving native coronary artery of native heart with angina pectoris [facility]) He describes ongoing stable anginal symptoms. These are provoked with walking or other exertion and relived with rest or sublingual nitroglycerin. This is slightly better than a few months ago but still limits his activity. He had repeat left heart cath that showed obstructive disease without targets amenable to PCI. So he is on medical management for this problem. Assessment CAD s/p CABG with 2/3 grafts restenosed, on medical management He still has angina with exertion so might benefit from increased beta blocker treatment, heart rate is 92 on arrival vitals today Plan Increase metoprolol succinate to 200mg  daily     Past Medical History:    Diagnosis Date  . Angina, class III ([place]) 08/24/2015  . AVM (arteriovenous malformation) 08/15/2017  . CAD (coronary artery disease) of artery bypass graft 06/25/2014   CABG- 2007   . CAD (coronary artery disease), native coronary artery 06/25/2014   CABG- 2007   . Carotid stenosis 03/03/2016   10-96% RICA, 0-45% LICA, >40% RECA stenosis Repeat 12/2016.  . Claudication ([place])   . Controlled type 2 diabetes mellitus without complication ([place]) 06/11/1190   Dx March of 2016. GAD65 negative Anti Islet cell negative    . DKA (diabetic ketoacidoses) ([place]) 12/07/2014  . Dyspnea   . History of CVA (cerebrovascular accident) 07/12/2014  . Hx of adenomatous colonic polyps 05/27/2015  . Hypercholesterolemia   . Hypertension   . Hypertensive heart disease 03/03/2016  . S/P CABG (coronary artery bypass graft) 03/28/2015   2007 at [place] [facility]   . Stroke ([place])    2015  . Tubular adenoma of colon 05/31/2015   Colonoscopy 05/20/15 with [name]: 3 tubullar adenoma's largest 2.3cm.  Plan for repeat Colonoscopy in 2019.    Review of Systems: Review of Systems  Respiratory: Negative for shortness of breath.   Cardiovascular: Positive for chest pain. Negative for leg swelling.  Gastrointestinal: Negative for blood in stool.  Musculoskeletal: Negative for falls.  Neurological: Negative for dizziness.     Physical Exam: Vitals:   08/30/17 1553  BP: 122/72  Pulse: 92  Temp: 98.2 F (36.8 C)  TempSrc: Oral  SpO2: 97%  Weight: 211 lb 9.6 oz (96 kg)  Height: 5\' 11"  (1.803 m)   GENERAL- alert, co-operative, NAD HEENT- Oral mucosa appears moist CARDIAC- RRR, no murmurs, rubs or gallops.  RESP- CTAB, no wheezes or crackles. EXTREMITIES- Symmetric, no pedal edema SKIN- Warm, dry, No rash or lesion. PSYCH- Normal mood and affect, appropriate thought content and speech.    Assessment & Plan:   See encounters tab for problem based medical decision making.   Patient discussed with Dr. Angelia Mould

## 2017-08-31 LAB — CBC
HEMATOCRIT: 36.8 % — AB (ref 37.5–51.0)
Hemoglobin: 11.3 g/dL — ABNORMAL LOW (ref 13.0–17.7)
MCH: 23.7 pg — ABNORMAL LOW (ref 26.6–33.0)
MCHC: 30.7 g/dL — ABNORMAL LOW (ref 31.5–35.7)
MCV: 77 fL — AB (ref 79–97)
PLATELETS: 368 10*3/uL (ref 150–379)
RBC: 4.76 x10E6/uL (ref 4.14–5.80)
RDW: 24.9 % — ABNORMAL HIGH (ref 12.3–15.4)
WBC: 5.5 10*3/uL (ref 3.4–10.8)

## 2017-09-01 ENCOUNTER — Ambulatory Visit: Payer: No Typology Code available for payment source | Admitting: Internal Medicine

## 2017-09-01 MED FILL — PANTOPRAZOLE SOD DR 40 MG T: 40 | 30 days supply | Qty: 30 | Fill #1

## 2017-09-02 NOTE — Assessment & Plan Note (Addendum)
He was felt to have increased anginal symptoms from stable obstructive CAD but blood loss from upper GI AVM source. He has not had further bleeding after endoscopy with treatment of these lesions. His chest pain is not as often as earlier this year but physical activity still provokes this regularly. He does still have heartburn symptoms Plan Checking blood count today for recovery of hemoglobin Continue omeprazole for acid suppression

## 2017-09-02 NOTE — Assessment & Plan Note (Addendum)
He is taking metformin 1g BID and januvia 100mg  daily without any complications. He has no signs or symptoms of hyperglycemia and he denies any episodes of symptomatic hypoglycemia. Hgb A1c is 6.1% today reflecting very good control. Assessment Well controlled type 2 diabetes with Hgb A1c below goal <7.0% Plan Continue current medications

## 2017-09-02 NOTE — Assessment & Plan Note (Addendum)
He describes ongoing stable anginal symptoms. These are provoked with walking or other exertion and relived with rest or sublingual nitroglycerin. This is slightly better than a few months ago but still limits his activity. He had repeat left heart cath that showed obstructive disease without targets amenable to PCI. So he is on medical management for this problem. Assessment CAD s/p CABG with 2/3 grafts restenosed, on medical management He still has angina with exertion so might benefit from increased beta blocker treatment, heart rate is 92 on arrival vitals today Plan Increase metoprolol succinate to 200mg  daily

## 2017-09-05 NOTE — Progress Notes (Signed)
Completed.

## 2017-09-05 NOTE — Progress Notes (Signed)
Internal Medicine Clinic Attending  I saw and evaluated the patient.  I personally confirmed the key portions of the history and exam documented by Dr. Rice and I reviewed pertinent patient test results.  The assessment, diagnosis, and plan were formulated together and I agree with the documentation in the resident's note.  

## 2017-09-06 ENCOUNTER — Ambulatory Visit (INDEPENDENT_AMBULATORY_CARE_PROVIDER_SITE_OTHER): Payer: No Typology Code available for payment source | Admitting: Cardiovascular Disease

## 2017-09-06 ENCOUNTER — Encounter: Payer: Self-pay | Admitting: Cardiovascular Disease

## 2017-09-06 VITALS — BP 118/82 | HR 76 | Ht 71.0 in | Wt 210.0 lb

## 2017-09-06 DIAGNOSIS — Z955 Presence of coronary angioplasty implant and graft: Secondary | ICD-10-CM

## 2017-09-06 DIAGNOSIS — I739 Peripheral vascular disease, unspecified: Secondary | ICD-10-CM

## 2017-09-06 DIAGNOSIS — I119 Hypertensive heart disease without heart failure: Secondary | ICD-10-CM

## 2017-09-06 DIAGNOSIS — I209 Angina pectoris, unspecified: Secondary | ICD-10-CM

## 2017-09-06 DIAGNOSIS — I6523 Occlusion and stenosis of bilateral carotid arteries: Secondary | ICD-10-CM

## 2017-09-06 DIAGNOSIS — E7800 Pure hypercholesterolemia, unspecified: Secondary | ICD-10-CM

## 2017-09-06 MED ORDER — ISOSORBIDE MONONITRATE ER 60 MG PO TB24: ORAL_TABLET | ORAL | 5 refills | Status: DC

## 2017-09-06 NOTE — Progress Notes (Signed)
Cardiology Office Note   Date:  09/08/2017   ID:  Mercury Rock, DOB 1955-10-30, MRN [id number]  PCP:  [name], DO  Cardiologist:   Skeet Latch, MD   No chief complaint on file.       Patient ID: Sevag Shearn is a 61 y.o. male with CAD s/p CABG, chronic stable angina, PAD, hypertension, diabetes and CVA who presents for follow up.  Mr. Jorge was first evaluated for chest pain on 07/27/15.  He previously underwent CABG in 2007. He had a nuclear stress test on 08/21/15 revealed LVEF 57% with moderate ischemia in the inferoseptal, inferior, and anterior locations. He subsequently underwent left heart catheterization on 08/26/15 that revealed severe three vessel disease including a 90% left main lesion and an occluded RCA and LAD. His LIMA to the LAD was patent but all other vein grafts were occluded. There were no optimal PCI targets.  He was started on carvedilol and Ranexa. Mr. Badie also underwent ABI testing 08/2015 that revealed diffuse atherosclerosis from the proximal abdominal aorta through the common and external iliac arteries bilaterally. There was no detectable flow in the L peroneal or anterior tibial arteries. ABIs were near 0.8 bilaterally. He was referred to Dr. Gwenlyn Found where medical management was advised. He was noted to have carotid bruits and was referred for carotid ultrasound on 12/09/15.  This revealed 40-59% stenosis on the right and 1-39% stenosis on the left.  He had repeat LE Dopplers and ABIs 03/2017 that were unchanged from prior.  Medical management was recommended.    Mr. Laskaris continued to have worsening angina despite optimal medical therapy.  He was referred for [facility]  05/2017 where he was found to have progression of the distal LM/LAD/LCx lesion.  He underwent DES placement in the ostial LCx, mid LCx, and  ostial LAD.  The ostial LAD was treated with a cutting ballpon reducing the stenosis from 85% to 30%.  It was recommended that he continue clopidogrel  and aspirin indefinitely.  He had an echo 06/01/17 that showed LVEF 65-70% with grade 1 diastolic dysfunction.  He followed up with [name] 06/08/17 and was doing well.  He had no chest pain but did have claudication.  Imdur was discontinued.  He followed up 07/18/17 and reported angina for the preceding 2 days.  Ranexa was restarted.  After making that change she continued to have angina, especially at night.  He followed up on 10/29 and continued to have angina.  There is some concern that it may also be GERD.  Therefore he was given a trial of pantoprazole and Imdur 60 mg daily was restarted.  He called our office 11/7 with progressive symptoms.  He was referred for cardiac catheterization but was found to be anemic with a hemoglobin of 7.7.  He was transfused and underwent upper endoscopy on 11/9.  He was found to have a hiatal hernia and 2 AVMs that were treated with APC.  He also received IV iron.  He subsequently underwent left heart catheterization that again showed severe three-vessel disease not amenable to PCI.  Apical management was recommended.  Since leaving the hospital Mr. Jakob was initially doing well.  However he has significantly limited his activity to avoid angina.  He is able to walk as long as he does not go too fast.  Metoprolol was increased to 200 mg daily.  In the last few days he notes that his chest pain is getting worse.  His problems typically  occur in the early morning when he is trying to get to sleep.  He has to sit up and take nitroglycerin.  He then has difficulty falling back asleep.  He denies lower extremity edema, orthopnea, or PND.  He has not noted any acid reflux.  He is in the process of mobility.  He is waiting to hear back from them.  He has been unable to work due to his angina with minimal activity.  He has really been struggling financially since he has been unable to work and medical bills are piling up.   Past Medical History:  Diagnosis Date  . Angina,  class III ([place]) 08/24/2015  . AVM (arteriovenous malformation) 08/15/2017  . CAD (coronary artery disease) of artery bypass graft 06/25/2014   CABG- 2007   . CAD (coronary artery disease), native coronary artery 06/25/2014   CABG- 2007   . Carotid stenosis 03/03/2016   09-73% RICA, 5-32% LICA, >99% RECA stenosis Repeat 12/2016.  . Claudication ([place])   . Controlled type 2 diabetes mellitus without complication ([place]) 11/07/2681   Dx March of 2016. GAD65 negative Anti Islet cell negative    . DKA (diabetic ketoacidoses) ([place]) 12/07/2014  . Dyspnea   . History of CVA (cerebrovascular accident) 07/12/2014  . Hx of adenomatous colonic polyps 05/27/2015  . Hypercholesterolemia   . Hypertension   . Hypertensive heart disease 03/03/2016  . S/P CABG (coronary artery bypass graft) 03/28/2015   2007 at [facility]   . Stroke ([place])    2015  . Tubular adenoma of colon 05/31/2015   Colonoscopy 05/20/15 with [name]: 3 tubullar adenoma's largest 2.3cm.  Plan for repeat Colonoscopy in 2019.    Past Surgical History:  Procedure Laterality Date  . CARDIAC CATHETERIZATION N/A 08/26/2015   Procedure: Left Heart Cath and Cors/Grafts Angiography;  Surgeon: [name], MD;  Location: [place] [facility];  Service: Cardiovascular;  Laterality: N/A;  . CARDIAC SURGERY    . CORONARY ARTERY BYPASS GRAFT    . CORONARY BALLOON ANGIOPLASTY N/A 05/25/2017   Procedure: CORONARY BALLOON ANGIOPLASTY;  Surgeon: [name], MD;  Location: [place] [facility];  Service: Cardiovascular;  Laterality: N/A;  . CORONARY STENT INTERVENTION N/A 05/25/2017   Procedure: CORONARY STENT INTERVENTION;  Surgeon: [name], MD;  Location: [place] [facility];  Service: Cardiovascular;  Laterality: N/A;  . ESOPHAGOGASTRODUODENOSCOPY N/A 08/12/2017   Procedure: ESOPHAGOGASTRODUODENOSCOPY (EGD);  Surgeon: Jerene Bears, MD;  Location: [facility];  Service: Gastroenterology;  Laterality: N/A;  . LEFT HEART CATH AND  CORS/GRAFTS ANGIOGRAPHY N/A 05/23/2017   Procedure: LEFT HEART CATH AND CORS/GRAFTS ANGIOGRAPHY;  Surgeon: [name], MD;  Location: [place] [facility];  Service: Cardiovascular;  Laterality: N/A;  . LEFT HEART CATH AND CORS/GRAFTS ANGIOGRAPHY N/A 08/15/2017   Procedure: LEFT HEART CATH AND CORS/GRAFTS ANGIOGRAPHY;  Surgeon: Burnell Blanks, MD;  Location: [place] [facility];  Service: Cardiovascular;  Laterality: N/A;    Current Outpatient Medications  Medication Sig Dispense Refill  . acetaminophen (TYLENOL) 325 MG tablet Take 2 tablets (650 mg total) every 4 (four) hours as needed by mouth for headache or mild pain.    Marland Kitchen aspirin EC 81 MG tablet Take 81 mg by mouth daily.    Marland Kitchen atorvastatin (LIPITOR) 40 MG tablet Take 1 tablet (40 mg total) by mouth every evening. 30 tablet 11  . clopidogrel (PLAVIX) 75 MG tablet Take 1 tablet (75 mg total) by mouth daily with  breakfast. 30 tablet 11  . Hypromellose (ARTIFICIAL TEARS OP) Apply 1 drop daily as needed to eye (dry eyes).    . isosorbide mononitrate (IMDUR) 60 MG 24 hr tablet 1 TABLET IN THE MORNING AND 1/2 TABLET IN THE EVENING 45 tablet 5  . Lancets MISC 1 Device by Does not apply route 4 (four) times daily -  before meals and at bedtime. 100 each 11  . lisinopril (PRINIVIL,ZESTRIL) 10 MG tablet Take 1 tablet (10 mg total) by mouth daily. 90 tablet 3  . metFORMIN (GLUCOPHAGE) 1000 MG tablet Take 1 tablet (1,000 mg total) 2 (two) times daily with a meal by mouth. 60 tablet 11  . metoprolol (TOPROL-XL) 200 MG 24 hr tablet Take 1 tablet (200 mg total) by mouth daily. Take with or immediately following a meal. 30 tablet 2  . nitroGLYCERIN (NITROSTAT) 0.4 MG SL tablet Place 1 tablet (0.4 mg total) under the tongue every 5 (five) minutes as needed for chest pain. 25 tablet 5  . omeprazole (PRILOSEC) 40 MG capsule Take 1 capsule (40 mg total) by mouth daily. 30 capsule 2  . ranolazine (RANEXA) 1000 MG SR tablet Take 1,000 mg by mouth 2  (two) times daily.    . sitaGLIPtin (JANUVIA) 100 MG tablet Take 1 tablet (100 mg total) by mouth daily. 30 tablet 0   No current facility-administered medications for this visit.     Allergies:   Patient has no known allergies.    Social History:  The patient  reports that he quit smoking about 7 years ago. he has never used smokeless tobacco. He reports that he does not drink alcohol or use drugs.   Family History:  The patient's family history includes Diabetes in his father; Hypertension in his mother and sister.    ROS:  Please see the history of present illness.   Otherwise, review of systems are positive for none.   All other systems are reviewed and negative.    PHYSICAL EXAM: VS:  BP 118/82   Pulse 76   Ht 5\' 11"  (1.803 m)   Wt 210 lb (95.3 kg)   BMI 29.29 kg/m  , BMI Body mass index is 29.29 kg/m. GENERAL:  Well appearing.  No acute distress HEENT: Pupils equal round and reactive, fundi not visualized, oral mucosa unremarkable NECK:  No jugular venous distention, waveform within normal limits, carotid upstroke brisk and symmetric, no bruits, no thyromegaly LUNGS:  Clear to auscultation bilaterally HEART:  RRR.  PMI not displaced or sustained,S1 and S2 within normal limits, no S3, no S4, no clicks, no rubs, no murmurs ABD:  Flat, positive bowel sounds normal in frequency in pitch, no bruits, no rebound, no guarding, no midline pulsatile mass, no hepatomegaly, no splenomegaly EXT:  2 plus pulses throughout, no edema, no cyanosis no clubbing SKIN:  No rashes no nodules NEURO:  Cranial nerves II through XII grossly intact, motor grossly intact throughout PSYCH:  Cognitively intact, oriented to person place and time   EKG:  EKG is ordered today. The ekg ordered today demonstrates sinus rhythm rate 82 bpm.  Prior inferior infarct.  07/02/16: Sinus rhythm rate 75 bpm.  Prior inferior infarct  12/29/16: Sinus rhythm.  Incomplete RBBB.  Prior inferior infarct.  05/18/17: Sinus  rhythm. Rate 87 bpm. B 1 PVCs. 07/18/17: Sinus rhythm.  Rate 71 bpm.  08/11/17: Sinus rhythm.  Rate 77 bpm.     LHC 08/15/17:  Mid RCA lesion is 100% stenosed.  Ost RCA to  Prox RCA lesion is 70% stenosed.  Ost 1st Mrg lesion is 60% stenosed.  Previously placed Ost 1st Mrg to 1st Mrg stent (unknown type) is widely patent.  Origin lesion is 100% stenosed.  Origin lesion is 100% stenosed.  Prox Graft lesion is 20% stenosed.  Prox LAD to Mid LAD lesion is 100% stenosed.  Previously placed Ost Cx to Prox Cx stent (unknown type) is widely patent.  Previously placed Ost LM to Dist LM stent (unknown type) is widely patent.  Previously placed Prox Cx to Mid Cx stent (unknown type) is widely patent.  SVG graft was not visualized.  SVG graft was not visualized.  Post intervention, there is a -1% residual stenosis.  LIMA graft was visualized by angiography and is normal in caliber.  The graft exhibits minimal luminal irregularities.  Post intervention, there is a -1% residual stenosis.  Ost LAD to Prox LAD lesion is 99% stenosed.  Post intervention, there is a -1% residual stenosis.   1. Severe triple vessel CAD s/p CABG with 1/3 patent bypass grafts.  2. The LAD is known to be chronically occluded in the mid segment. The mid and distal LAD fills from the patent LIMA graft. The ostial LAD is jailed by the stent extending from the left main into the Circumflex. The proximal LAD has a 99% stenosis following by an aneurysmal segment then a total occlusion.  3. The left main stent is patent 4. The ostial/proximal Circumflex stent is patent. The first OM stent is patent. There is a 60% ostial stenosis in the first OM branch just prior to the stent. The SVG to the OM is known to be occluded. The mid Circumflex stent is patent.  5. The native RCA has a long 70% proximal stenosis and 100% mid chronic occlusion. The vein graft the to distal RCA is known to be occluded and was not injected.    6. Normal filling pressures  Recommendations: Continue medical management of CAD. No focal targets for PCI. The proximal LAD stenosis is severe but the majority of this territory is supplied by the LIMA graft and the LAD is jailed by the LM/Circ stent. I suspect that his anemia has contributed to his recent chest pain given his severe, diffuse CAD.    Echo 06/01/17: Study Conclusions  - Procedure narrative: Transthoracic echocardiography. Image   quality was poor. Intravenous contrast (Definity) was   administered. - Left ventricle: The cavity size was normal. There was mild focal   basal hypertrophy of the septum. Systolic function was vigorous.   The estimated ejection fraction was in the range of 65% to 70%.   Wall motion was normal; there were no regional wall motion   abnormalities. Doppler parameters are consistent with abnormal   left ventricular relaxation (grade 1 diastolic dysfunction). - Aortic valve: Trileaflet; mildly thickened, mildly calcified   leaflets. - Left atrium: The atrium was mildly dilated.   Lexiscan Cardiolite 08/21/15:  Nuclear stress EF: 57%.  The left ventricular ejection fraction is normal (55-65%).  There was no ST segment deviation noted during stress.  No T wave inversion was noted during stress.  Defect 1: There is a small defect of moderate severity present in the basal inferoseptal and basal inferior location.  Defect 2: There is a defect present in the basal anterior, mid anterior and apical anterior location.  Findings consistent with ischemia.  This is a high risk study.  High risk stress nuclear study with two areas of ischemia and normal  left ventricular regional and global systolic function. The inferior area of ischemia is small. The anterior area of ischemia is extensive, but appears to be very mild. The septum is spared.  Carotid Doppler 12/11/15: 37-62% RICA, 8-31% LICA, >51% RECA stenosis  ABI 08/12/15: R 0.81, L  0.89 Diffuse atherosclerosis from the proximal abdominal aorta into both tibial arteries. Several areas of narrowing are noted in both lower extremities, although no focal significant stenosis is identified. One vessel run-off on the right, and three vessel run-off on the left.  ABI 02/03/17: R 0.81, L 0.91 Abnormal lower arterial Doppler study, with waveforms suggestive of inflow disease. The bilateral ABI's are stable and in the mild range. The right great toe pressure is normal. The left great toe pressure is abnormal   Recent Labs: 08/15/2017: BUN 15; Creatinine, Ser 1.15; Potassium 3.7; Sodium 135 08/30/2017: Hemoglobin 11.3; Platelets 368    Lipid Panel    Component Value Date/Time   CHOL 93 08/12/2017 0334   CHOL 129 07/10/2015 1418   TRIG 157 (H) 08/12/2017 0334   HDL 26 (L) 08/12/2017 0334   HDL 21 (L) 07/10/2015 1418   CHOLHDL 3.6 08/12/2017 0334   VLDL 31 08/12/2017 0334   LDLCALC 36 08/12/2017 0334   LDLCALC 45 07/10/2015 1418      Wt Readings from Last 3 Encounters:  09/06/17 210 lb (95.3 kg)  08/30/17 211 lb 9.6 oz (96 kg)  08/15/17 205 lb 14.6 oz (93.4 kg)      ASSESSMENT AND PLAN:  # CAD s/p CABG:  # Angina: Mr. Meinzer has severe 3 vessel CAD and all his CABG grafts are occluded with the exception of his LIMA to the LAD.  He underwent successful PCI of the LM, LCx and OM1.  Repeat cardiac catheterization still revealed no targets.  He has been taking Imdur 60 mg daily.  His symptoms seem to occur in the late night or early morning before it is time to take the next dose.  It is always alleviated with nitroglycerin.  We will add a 30 mg dose in the evenings.  If this does not work he can increase this to 60 mg to a total of 60 mg twice daily.  Continue aspirin, atorvastatin, clopidogrel, metoprolol, and Ranexa.    # Hypertension: Blood pressure well-controlled.  Continue lisinopril, metoprolol, and Imdur as above.   # Hyperlipidemia:  Continue atorvastatin  and fish oil. LDL 36 08/2017.   # Claudication: ABIs 0.8 bilaterally and medical management was recommended in 2016.  Unchanged on repeat.  Encouraged ambulation. Repeat ABIs 03/2018.  Continue aspirin and statin. Recommended that he follow-up with his PCP to see if this could be sciatica.  # Carotid stenosis: Carotid Doppler revealed 50-49% R ICA stenosis and 1-39% stenosis on the left.  Continue aspirin and atorvastatin.   Current medicines are reviewed at length with the patient today.  The patient does not have concerns regarding medicines.  The following changes have been made:  none  Labs/ tests ordered today include:   No orders of the defined types were placed in this encounter.    Disposition:   FU with Shuntae Herzig C. Oval Linsey, MD in 2 weeks.   Signed, Skeet Latch, MD  09/08/2017 8:45 AM    [place]

## 2017-09-06 NOTE — Patient Instructions (Signed)
Medication Instructions:  INCREASE YOUR ISOSORBIDE TO 60 MG IN THE MORNING AND 30 MG IN THE EVENING. IF YOU CONTINUE TO HAVE SYMPTOMS YOU MAY INCREASE THAT EVENING DOSE TO 60 MG   Labwork: NONE  Testing/Procedures: NONE  Follow-Up: Your physician recommends that you schedule a follow-up appointment in: 2 MONTH OV   If you need a refill on your cardiac medications before your next appointment, please call your pharmacy.

## 2017-09-08 ENCOUNTER — Encounter: Payer: Self-pay | Admitting: Cardiovascular Disease

## 2017-09-22 ENCOUNTER — Other Ambulatory Visit: Payer: Self-pay | Admitting: Cardiovascular Disease

## 2017-09-22 NOTE — Telephone Encounter (Signed)
Please review for refill, Thanks !  

## 2017-09-29 MED FILL — PANTOPRAZOLE SOD DR 40 MG T: 40 | 30 days supply | Qty: 30 | Fill #2

## 2017-09-29 MED FILL — METOPROLOL SUCC ER 200 MG T: 200 | 30 days supply | Qty: 30 | Fill #1

## 2017-10-06 ENCOUNTER — Other Ambulatory Visit: Payer: Self-pay | Admitting: Internal Medicine

## 2017-10-06 NOTE — Telephone Encounter (Signed)
Patient is requesting a refills Januvia, pls contact patient

## 2017-10-07 MED ORDER — SITAGLIPTIN PHOSPHATE 100 MG PO TABS: 100.0000 mg | ORAL_TABLET | Freq: Every day | ORAL | 3 refills | Status: DC

## 2017-10-10 MED FILL — JANUVIA 100 MG TABLET: 100 | 30 days supply | Qty: 30 | Fill #0

## 2017-10-12 ENCOUNTER — Other Ambulatory Visit: Payer: Self-pay | Admitting: Cardiology

## 2017-10-12 MED FILL — LISINOPRIL 10 MG TABS: 10 | 90 days supply | Qty: 90 | Fill #0

## 2017-10-18 ENCOUNTER — Telehealth: Payer: Self-pay | Admitting: Cardiovascular Disease

## 2017-10-18 ENCOUNTER — Other Ambulatory Visit: Payer: Self-pay | Admitting: Cardiovascular Disease

## 2017-10-18 NOTE — Telephone Encounter (Signed)
[facility]   *STAT* If patient is at the pharmacy, call can be transferred to refill team.   1. Which medications need to be refilled? (please list name of each medication and dose if known) nitroglycerin 0.4mg    2. Which pharmacy/location (including street and city if local pharmacy) is medication to be sent to? [facility] [place]   3. Do they need a 30 day or 90 day supply? [place]

## 2017-10-18 NOTE — Telephone Encounter (Signed)
Please review for refill, thanks ! 

## 2017-10-31 MED FILL — PANTOPRAZOLE SOD DR 40 MG T: 40 | 30 days supply | Qty: 30 | Fill #3

## 2017-10-31 MED FILL — METOPROLOL SUCC ER 200 MG T: 200 | 30 days supply | Qty: 30 | Fill #2

## 2017-11-07 MED FILL — JANUVIA 100 MG TABLET: 100 | 30 days supply | Qty: 30 | Fill #1

## 2017-11-19 NOTE — Progress Notes (Signed)
Cardiology Office Note   Date:  11/21/2017   ID:  Jedaiah Rathbun, DOB [birth year], MRN [id number]  PCP:  [name], DO  Cardiologist:   Skeet Latch, MD   Chief Complaint  Patient presents with  . Follow-up    pt states some chest pain  . Shortness of Breath    states some         Patient ID: Jason Velasquez is a 62 y.o. male with CAD s/p CABG, chronic stable angina, PAD, hypertension, diabetes and CVA who presents for follow up.  Jason Velasquez was first evaluated for chest pain on 07/27/15.  He previously underwent CABG in 2007. He had a nuclear stress test on 08/21/15 revealed LVEF 57% with moderate ischemia in the inferoseptal, inferior, and anterior locations. He subsequently underwent left heart catheterization on 08/26/15 that revealed severe three vessel disease including a 90% left main lesion and an occluded RCA and LAD. His LIMA to the LAD was patent but all other vein grafts were occluded. There were no optimal PCI targets.  He was started on carvedilol and Ranexa. Jason Velasquez also underwent ABI testing 08/2015 that revealed diffuse atherosclerosis from the proximal abdominal aorta through the common and external iliac arteries bilaterally. There was no detectable flow in the L peroneal or anterior tibial arteries. ABIs were near 0.8 bilaterally. He was referred to Dr. Gwenlyn Found where medical management was advised. He was noted to have carotid bruits and was referred for carotid ultrasound on 12/09/15.  This revealed 40-59% stenosis on the right and 1-39% stenosis on the left.  He had repeat LE Dopplers and ABIs 03/2017 that were unchanged from prior.  Medical management was recommended.    Jason Velasquez continued to have worsening angina despite optimal medical therapy.  He was referred for [facility]  05/2017 where he was found to have progression of the distal LM/LAD/LCx lesion.  He underwent DES placement in the ostial LCx, mid LCx, and  ostial LAD.  The ostial LAD was treated with a cutting  ballpon reducing the stenosis from 85% to 30%.  It was recommended that he continue clopidogrel and aspirin indefinitely.  He had an echo 06/01/17 that showed LVEF 65-70% with grade 1 diastolic dysfunction.  He followed up with [name] 06/08/17 and was doing well.  He had no chest pain but did have claudication.  Imdur was discontinued.  He followed up 07/18/17 and reported angina for the preceding 2 days.  Ranexa was restarted.  After making that change she continued to have angina, especially at night.  He followed up on 10/29 and continued to have angina.  There is some concern that it may also be GERD.  Therefore he was given a trial of pantoprazole and Imdur 60 mg daily was restarted.  He called our office 11/7 with progressive symptoms.  He was referred for cardiac catheterization but was found to be anemic with a hemoglobin of 7.7.  He was transfused and underwent upper endoscopy on 11/9.  He was found to have a hiatal hernia and 2 AVMs that were treated with APC.  He also received IV iron.  He subsequently underwent left heart catheterization that again showed severe three-vessel disease not amenable to PCI.  Apical management was recommended.  At his last appointment Jason Velasquez had persistent chest pain.  His symptoms are worse in the evenings and prevent him from sleeping.  An evening dose of Imdur was added to his regimen.  He this helped for  a while and then he increase the dose to 60.  He was doing well for several months.  However last week he started awakening again with chest pain.  It is difficult for him to stay asleep.  He then sits on his couch and is sometimes able to go back to sleep.  He has left arm discomfort when this occurs.  There is no associated shortness of breath, nausea, or diaphoresis.  Interestingly, he has not had any angina in the daytime since December.  He does limit his activities due to angina.  The only other time he had chest pain was when he was bending over.  He  denies any sour taste in his mouth or burning sensation.   Past Medical History:  Diagnosis Date  . Angina, class III ([place]) 08/24/2015  . AVM (arteriovenous malformation) 08/15/2017  . CAD (coronary artery disease) of artery bypass graft 06/25/2014   CABG- 2007   . CAD (coronary artery disease), native coronary artery 06/25/2014   CABG- 2007   . Carotid stenosis 03/03/2016   78-29% RICA, 5-62% LICA, >13% RECA stenosis Repeat 12/2016.  . Claudication ([place])   . Controlled type 2 diabetes mellitus without complication ([place]) 0/05/6577   Dx March of 2016. GAD65 negative Anti Islet cell negative    . DKA (diabetic ketoacidoses) ([place]) 12/07/2014  . Dyspnea   . History of CVA (cerebrovascular accident) 07/12/2014  . Hx of adenomatous colonic polyps 05/27/2015  . Hypercholesterolemia   . Hypertension   . Hypertensive heart disease 03/03/2016  . S/P CABG (coronary artery bypass graft) 03/28/2015   2007 at [place]   . Stroke ([place])    2015  . Tubular adenoma of colon 05/31/2015   Colonoscopy 05/20/15 with [name]: 3 tubullar adenoma's largest 2.3cm.  Plan for repeat Colonoscopy in 2019.    Past Surgical History:  Procedure Laterality Date  . CARDIAC CATHETERIZATION N/A 08/26/2015   Procedure: Left Heart Cath and Cors/Grafts Angiography;  Surgeon: [name], MD;  Location: [place] [facility];  Service: Cardiovascular;  Laterality: N/A;  . CARDIAC SURGERY    . CORONARY ARTERY BYPASS GRAFT    . CORONARY BALLOON ANGIOPLASTY N/A 05/25/2017   Procedure: CORONARY BALLOON ANGIOPLASTY;  Surgeon: [name], MD;  Location: [place] [facility];  Service: Cardiovascular;  Laterality: N/A;  . CORONARY STENT INTERVENTION N/A 05/25/2017   Procedure: CORONARY STENT INTERVENTION;  Surgeon: [name], MD;  Location: [place] [facility];  Service: Cardiovascular;  Laterality: N/A;  . ESOPHAGOGASTRODUODENOSCOPY N/A 08/12/2017   Procedure: ESOPHAGOGASTRODUODENOSCOPY (EGD);  Surgeon:  Jerene Bears, MD;  Location: [facility] ENDOSCOPY;  Service: Gastroenterology;  Laterality: N/A;  . LEFT HEART CATH AND CORS/GRAFTS ANGIOGRAPHY N/A 05/23/2017   Procedure: LEFT HEART CATH AND CORS/GRAFTS ANGIOGRAPHY;  Surgeon: [name], MD;  Location: [place] [facility];  Service: Cardiovascular;  Laterality: N/A;  . LEFT HEART CATH AND CORS/GRAFTS ANGIOGRAPHY N/A 08/15/2017   Procedure: LEFT HEART CATH AND CORS/GRAFTS ANGIOGRAPHY;  Surgeon: Burnell Blanks, MD;  Location: [place] [facility];  Service: Cardiovascular;  Laterality: N/A;    Current Outpatient Medications  Medication Sig Dispense Refill  . acetaminophen (TYLENOL) 325 MG tablet Take 2 tablets (650 mg total) every 4 (four) hours as needed by mouth for headache or mild pain.    Marland Kitchen aspirin EC 81 MG tablet Take 81 mg by mouth daily.    Marland Kitchen atorvastatin (LIPITOR) 40 MG tablet Take 1 tablet (40 mg  total) by mouth every evening. 30 tablet 11  . clopidogrel (PLAVIX) 75 MG tablet Take 1 tablet (75 mg total) by mouth daily with breakfast. 30 tablet 11  . Hypromellose (ARTIFICIAL TEARS OP) Apply 1 drop daily as needed to eye (dry eyes).    . isosorbide mononitrate (IMDUR) 120 MG 24 hr tablet 1/2 TABLET BY MOUTH IN THE MORNING AND 1 IN THE EVENING 45 tablet 5  . Lancets MISC 1 Device by Does not apply route 4 (four) times daily -  before meals and at bedtime. 100 each 11  . lisinopril (PRINIVIL,ZESTRIL) 10 MG tablet TAKE 1 TABLET BY MOUTH DAILY. 90 tablet 3  . metFORMIN (GLUCOPHAGE) 1000 MG tablet Take 1 tablet (1,000 mg total) 2 (two) times daily with a meal by mouth. 60 tablet 11  . metoprolol (TOPROL-XL) 200 MG 24 hr tablet Take 1 tablet (200 mg total) by mouth daily. Take with or immediately following a meal. 30 tablet 2  . nitroGLYCERIN (NITROSTAT) 0.4 MG SL tablet PLACE 1 TABLET UNDER THE TONGUE EVERY 5 MINUTES AS NEEDED FOR CHEST PAIN. 25 tablet 5  . omeprazole (PRILOSEC) 40 MG capsule Take 40 mg by mouth 2 (two) times daily.    .  ranolazine (RANEXA) 1000 MG SR tablet Take 1,000 mg by mouth 2 (two) times daily.    . sitaGLIPtin (JANUVIA) 100 MG tablet Take 1 tablet (100 mg total) by mouth daily. 90 tablet 3   No current facility-administered medications for this visit.     Allergies:   Patient has no known allergies.    Social History:  The patient  reports that he quit smoking about 8 years ago. he has never used smokeless tobacco. He reports that he does not drink alcohol or use drugs.   Family History:  The patient's family history includes Diabetes in his father; Hypertension in his mother and sister.    ROS:  Please see the history of present illness.   Otherwise, review of systems are positive for none.   All other systems are reviewed and negative.    PHYSICAL EXAM: VS:  BP 128/74   Pulse 83   Ht 5' 10.5" (1.791 m)   Wt 211 lb 12.8 oz (96.1 kg)   SpO2 100%   BMI 29.96 kg/m  , BMI Body mass index is 29.96 kg/m. GENERAL:  Well appearing HEENT: Pupils equal round and reactive, fundi not visualized, oral mucosa unremarkable NECK:  No jugular venous distention, waveform within normal limits, carotid upstroke brisk and symmetric, no bruits, no thyromegaly LYMPHATICS:  No cervical adenopathy LUNGS:  Clear to auscultation bilaterally HEART:  RRR.  PMI not displaced or sustained,S1 and S2 within normal limits, no S3, no S4, no clicks, no rubs, no murmurs ABD:  Flat, positive bowel sounds normal in frequency in pitch, no bruits, no rebound, no guarding, no midline pulsatile mass, no hepatomegaly, no splenomegaly EXT:  2 plus pulses throughout, no edema, no cyanosis no clubbing SKIN:  No rashes no nodules NEURO:  Cranial nerves II through XII grossly intact, motor grossly intact throughout PSYCH:  Cognitively intact, oriented to person place and time   EKG:  EKG is ordered today. The ekg ordered today demonstrates sinus rhythm rate 82 bpm.  Prior inferior infarct.  07/02/16: Sinus rhythm rate 75 bpm.  Prior  inferior infarct  12/29/16: Sinus rhythm.  Incomplete RBBB.  Prior inferior infarct.  05/18/17: Sinus rhythm. Rate 87 bpm. B 1 PVCs. 07/18/17: Sinus rhythm.  Rate 71 bpm.  08/11/17: Sinus rhythm.  Rate 77 bpm.     LHC 08/15/17:  Mid RCA lesion is 100% stenosed.  Ost RCA to Prox RCA lesion is 70% stenosed.  Ost 1st Mrg lesion is 60% stenosed.  Previously placed Ost 1st Mrg to 1st Mrg stent (unknown type) is widely patent.  Origin lesion is 100% stenosed.  Origin lesion is 100% stenosed.  Prox Graft lesion is 20% stenosed.  Prox LAD to Mid LAD lesion is 100% stenosed.  Previously placed Ost Cx to Prox Cx stent (unknown type) is widely patent.  Previously placed Ost LM to Dist LM stent (unknown type) is widely patent.  Previously placed Prox Cx to Mid Cx stent (unknown type) is widely patent.  SVG graft was not visualized.  SVG graft was not visualized.  Post intervention, there is a -1% residual stenosis.  LIMA graft was visualized by angiography and is normal in caliber.  The graft exhibits minimal luminal irregularities.  Post intervention, there is a -1% residual stenosis.  Ost LAD to Prox LAD lesion is 99% stenosed.  Post intervention, there is a -1% residual stenosis.   1. Severe triple vessel CAD s/p CABG with 1/3 patent bypass grafts.  2. The LAD is known to be chronically occluded in the mid segment. The mid and distal LAD fills from the patent LIMA graft. The ostial LAD is jailed by the stent extending from the left main into the Circumflex. The proximal LAD has a 99% stenosis following by an aneurysmal segment then a total occlusion.  3. The left main stent is patent 4. The ostial/proximal Circumflex stent is patent. The first OM stent is patent. There is a 60% ostial stenosis in the first OM branch just prior to the stent. The SVG to the OM is known to be occluded. The mid Circumflex stent is patent.  5. The native RCA has a long 70% proximal stenosis and 100%  mid chronic occlusion. The vein graft the to distal RCA is known to be occluded and was not injected.  6. Normal filling pressures  Recommendations: Continue medical management of CAD. No focal targets for PCI. The proximal LAD stenosis is severe but the majority of this territory is supplied by the LIMA graft and the LAD is jailed by the LM/Circ stent. I suspect that his anemia has contributed to his recent chest pain given his severe, diffuse CAD.    Echo 06/01/17: Study Conclusions  - Procedure narrative: Transthoracic echocardiography. Image   quality was poor. Intravenous contrast (Definity) was   administered. - Left ventricle: The cavity size was normal. There was mild focal   basal hypertrophy of the septum. Systolic function was vigorous.   The estimated ejection fraction was in the range of 65% to 70%.   Wall motion was normal; there were no regional wall motion   abnormalities. Doppler parameters are consistent with abnormal   left ventricular relaxation (grade 1 diastolic dysfunction). - Aortic valve: Trileaflet; mildly thickened, mildly calcified   leaflets. - Left atrium: The atrium was mildly dilated.   Lexiscan Cardiolite 08/21/15:  Nuclear stress EF: 57%.  The left ventricular ejection fraction is normal (55-65%).  There was no ST segment deviation noted during stress.  No T wave inversion was noted during stress.  Defect 1: There is a small defect of moderate severity present in the basal inferoseptal and basal inferior location.  Defect 2: There is a defect present in the basal anterior, mid anterior and apical anterior location.  Findings  consistent with ischemia.  This is a high risk study.  High risk stress nuclear study with two areas of ischemia and normal left ventricular regional and global systolic function. The inferior area of ischemia is small. The anterior area of ischemia is extensive, but appears to be very mild. The septum is  spared.  Carotid Doppler 12/11/15: 31-54% RICA, 0-08% LICA, >67% RECA stenosis  ABI 08/12/15: R 0.81, L 0.89 Diffuse atherosclerosis from the proximal abdominal aorta into both tibial arteries. Several areas of narrowing are noted in both lower extremities, although no focal significant stenosis is identified. One vessel run-off on the right, and three vessel run-off on the left.  ABI 02/03/17: R 0.81, L 0.91 Abnormal lower arterial Doppler study, with waveforms suggestive of inflow disease. The bilateral ABI's are stable and in the mild range. The right great toe pressure is normal. The left great toe pressure is abnormal   Recent Labs: 08/15/2017: BUN 15; Creatinine, Ser 1.15; Potassium 3.7; Sodium 135 08/30/2017: Hemoglobin 11.3; Platelets 368    Lipid Panel    Component Value Date/Time   CHOL 93 08/12/2017 0334   CHOL 129 07/10/2015 1418   TRIG 157 (H) 08/12/2017 0334   HDL 26 (L) 08/12/2017 0334   HDL 21 (L) 07/10/2015 1418   CHOLHDL 3.6 08/12/2017 0334   VLDL 31 08/12/2017 0334   LDLCALC 36 08/12/2017 0334   LDLCALC 45 07/10/2015 1418      Wt Readings from Last 3 Encounters:  11/21/17 211 lb 12.8 oz (96.1 kg)  09/06/17 210 lb (95.3 kg)  08/30/17 211 lb 9.6 oz (96 kg)      ASSESSMENT AND PLAN:  # CAD s/p CABG:  # Angina: Mr. Zurn has severe 3 vessel CAD and all his CABG grafts are occluded with the exception of his LIMA to the LAD.  He underwent successful PCI of the LM, LCx and OM1.  Repeat cardiac catheterization still revealed no targets.  He has been taking Imdur 60 bid.  He was doing well with this regimen but has more angina in the last few weeks.  We will increase Imdur to 120 at night and 60mg  in the day.  I am suspicious that his symptoms are actually GERD given that it is worse when when he lays down and bends over.  Increase omeprazole to bid.  Continue aspirin, atorvastatin, clopidogrel, Imdur, lisinopril, metoprolol and ranolazine.    # Hypertension:  Blood pressure well-controlled.  Continue lisinopril, metoprolol, and increase Imdur as above.   # Hyperlipidemia:  Continue atorvastatin and fish oil. LDL 36 08/2017.  Repeat at follow up.  # Claudication: ABIs 0.8 bilaterally and medical management was recommended in 2016.  Unchanged on repeat.  Encouraged ambulation. Repeat ABIs 03/2018.  Continue aspirin and statin. Recommended that he follow-up with his PCP to see if this could be sciatica.  # Carotid stenosis: Carotid Doppler revealed 50-49% R ICA stenosis and 1-39% stenosis on the left.  Continue aspirin and atorvastatin.   Current medicines are reviewed at length with the patient today.  The patient does not have concerns regarding medicines.  The following changes have been made:  none  Labs/ tests ordered today include:   No orders of the defined types were placed in this encounter.    Disposition:   FU with Kalianne Fetting C. Oval Linsey, MD in 2 months.   Signed, Skeet Latch, MD  11/21/2017 12:40 PM    [place]

## 2017-11-21 ENCOUNTER — Ambulatory Visit (INDEPENDENT_AMBULATORY_CARE_PROVIDER_SITE_OTHER): Payer: No Typology Code available for payment source | Admitting: Cardiovascular Disease

## 2017-11-21 ENCOUNTER — Encounter: Payer: Self-pay | Admitting: Cardiovascular Disease

## 2017-11-21 VITALS — BP 128/74 | HR 83 | Ht 70.5 in | Wt 211.8 lb

## 2017-11-21 DIAGNOSIS — D500 Iron deficiency anemia secondary to blood loss (chronic): Secondary | ICD-10-CM

## 2017-11-21 DIAGNOSIS — Z955 Presence of coronary angioplasty implant and graft: Secondary | ICD-10-CM

## 2017-11-21 DIAGNOSIS — I208 Other forms of angina pectoris: Secondary | ICD-10-CM

## 2017-11-21 DIAGNOSIS — I119 Hypertensive heart disease without heart failure: Secondary | ICD-10-CM

## 2017-11-21 DIAGNOSIS — E7800 Pure hypercholesterolemia, unspecified: Secondary | ICD-10-CM

## 2017-11-21 DIAGNOSIS — I739 Peripheral vascular disease, unspecified: Secondary | ICD-10-CM

## 2017-11-21 DIAGNOSIS — I25709 Atherosclerosis of coronary artery bypass graft(s), unspecified, with unspecified angina pectoris: Secondary | ICD-10-CM

## 2017-11-21 MED ORDER — ISOSORBIDE MONONITRATE ER 120 MG PO TB24: ORAL_TABLET | ORAL | 5 refills | Status: DC

## 2017-11-21 NOTE — Patient Instructions (Addendum)
Medication Instructions:  INCREASE YOUR OMEPRAZOLE TO TWICE A DAY   INCREASE YOUR ISOSORBIDE TO 60 MG IN THE  MORNING AND 120 MG IN THE EVENING   Labwork: NONE  Testing/Procedures: NONE  Follow-Up: Your physician recommends that you schedule a follow-up appointment in: 2 MONTH OV   If you need a refill on your cardiac medications before your next appointment, please call your pharmacy.

## 2017-11-29 ENCOUNTER — Other Ambulatory Visit: Payer: Self-pay | Admitting: Internal Medicine

## 2017-11-29 DIAGNOSIS — I25119 Atherosclerotic heart disease of native coronary artery with unspecified angina pectoris: Secondary | ICD-10-CM

## 2017-11-29 MED FILL — METOPROLOL SUCC ER 200 MG T: 200 | 30 days supply | Qty: 30 | Fill #0

## 2017-11-29 MED FILL — PANTOPRAZOLE SOD DR 40 MG T: 40 | 30 days supply | Qty: 30 | Fill #4

## 2017-11-29 MED FILL — JANUVIA 100 MG TABLET: 100 | 30 days supply | Qty: 30 | Fill #2

## 2017-11-30 NOTE — Progress Notes (Signed)
Subjective:  HPI: JasonJason Velasquez is a 62 y.o. male who presents for f/u DM, HTN, caudication  Please see Assessment and Plan below for the status of his chronic medical problems.  Review of Systems: ROS  Objective:  Physical Exam: Vitals:   12/01/17 1113  BP: (!) 142/72  Pulse: 77  Temp: 97.8 F (36.6 C)  TempSrc: Oral  SpO2: 100%  Weight: 216 lb 1.6 oz (98 kg)  Height: 5\' 11"  (1.803 m)   Physical Exam  Constitutional: He is well-developed, well-nourished, and in no distress.  Cardiovascular: Normal rate, regular rhythm, normal heart sounds and intact distal pulses.  No murmur heard. Pulmonary/Chest: Effort normal and breath sounds normal.  Abdominal: Soft. Bowel sounds are normal.  Musculoskeletal: He exhibits no edema.       Lumbar back: He exhibits normal range of motion, no tenderness, no swelling and no pain.  SLR negative bilaterally  Nursing note and vitals reviewed.  Assessment & Plan:  Essential hypertension HPI: No complaints, has not taken meds today.  A: Essential HTN  P: BP has been well controlled, slightly elevated today- will not make any changes today.  Coronary artery disease involving native coronary artery of native heart with angina pectoris [facility]) HPI: Following with Dr Oval Linsey.  No target candiates on cath.  Currently on medical management>> still haiving chest pain, typically at night and in early morning.  Dr Oval Linsey feels this may be due to GERD, asked him to increase PPI to BID.  He seems to have misunderstood this recommendation and has not increased.  On EGD last year he was noted to have a 2cm hiatal hernia.  A: CAD with angina  P: Cotninue medical management and follow up with Dr Oval Linsey.  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  A: Controlled Type 2 DM, with peripheral  vascular disease  P: Continue Metformin 1g BID and januvia 100mg  daily In future if able to afford it would be great to substitute GLP-1a for DDP4 given his extensive CAD history  Claudication [facility]) HPI: He continues to have persistent caudication symptoms, he has been following with Dr Gwenlyn Found Dr Oval Linsey.   Dr Oval Linsey recently asked him to follow up with me to discuss possible neurologic caudication given his mild disease on ABI. For me he denies any significant back pain, pain is located in bilateral calfs, worse with walking, resolves in about 1 minute with rest.  Present when pushing shopping cart.  No present when standing erect for long periods.   He has not started any formal exercise program. SLR negative bilaterally.  A: Claudication  P: I suspect his claudication is most likely due to PVD rather than neurologic claudication (although this is a possibility).  Will continue to monitor.  I asked him today to start specific walking exercise program- at least 10 minutes a day.  Iron deficiency anemia Recheck CBC and ferritin   Medications Ordered No orders of the defined types were placed in this encounter.  Other Orders Orders Placed This Encounter  Procedures  . CBC with Diff  . Ferritin  . Glucose, capillary  . Ambulatory referral to Podiatry    Referral Priority:   Routine    Referral Type:   Consultation    Referral Reason:   Specialty Services Required    Requested Specialty:   Podiatry    Number  of Visits Requested:   1  . POC Hbg A1C   Follow Up: Return in about 3 months (around 02/28/2018).

## 2017-12-01 ENCOUNTER — Ambulatory Visit (INDEPENDENT_AMBULATORY_CARE_PROVIDER_SITE_OTHER): Payer: No Typology Code available for payment source | Admitting: Internal Medicine

## 2017-12-01 ENCOUNTER — Encounter: Payer: Self-pay | Admitting: Internal Medicine

## 2017-12-01 ENCOUNTER — Other Ambulatory Visit: Payer: Self-pay

## 2017-12-01 VITALS — BP 142/72 | HR 77 | Temp 97.8°F | Ht 71.0 in | Wt 216.1 lb

## 2017-12-01 DIAGNOSIS — I25119 Atherosclerotic heart disease of native coronary artery with unspecified angina pectoris: Secondary | ICD-10-CM

## 2017-12-01 DIAGNOSIS — I739 Peripheral vascular disease, unspecified: Secondary | ICD-10-CM

## 2017-12-01 DIAGNOSIS — IMO0001 Reserved for inherently not codable concepts without codable children: Secondary | ICD-10-CM

## 2017-12-01 DIAGNOSIS — K449 Diaphragmatic hernia without obstruction or gangrene: Secondary | ICD-10-CM

## 2017-12-01 DIAGNOSIS — D509 Iron deficiency anemia, unspecified: Secondary | ICD-10-CM

## 2017-12-01 DIAGNOSIS — I200 Unstable angina: Secondary | ICD-10-CM

## 2017-12-01 DIAGNOSIS — Z79899 Other long term (current) drug therapy: Secondary | ICD-10-CM

## 2017-12-01 DIAGNOSIS — K219 Gastro-esophageal reflux disease without esophagitis: Secondary | ICD-10-CM

## 2017-12-01 DIAGNOSIS — Z7984 Long term (current) use of oral hypoglycemic drugs: Secondary | ICD-10-CM

## 2017-12-01 DIAGNOSIS — E11319 Type 2 diabetes mellitus with unspecified diabetic retinopathy without macular edema: Secondary | ICD-10-CM

## 2017-12-01 DIAGNOSIS — I10 Essential (primary) hypertension: Secondary | ICD-10-CM

## 2017-12-01 DIAGNOSIS — E1151 Type 2 diabetes mellitus with diabetic peripheral angiopathy without gangrene: Secondary | ICD-10-CM

## 2017-12-01 DIAGNOSIS — D500 Iron deficiency anemia secondary to blood loss (chronic): Secondary | ICD-10-CM

## 2017-12-01 LAB — GLUCOSE, CAPILLARY: Glucose-Capillary: 120 mg/dL — ABNORMAL HIGH (ref 65–99)

## 2017-12-01 LAB — POCT GLYCOSYLATED HEMOGLOBIN (HGB A1C): Hemoglobin A1C: 6.8

## 2017-12-01 NOTE — Assessment & Plan Note (Addendum)
HPI: No polyuria or polydisia, Overall feels he is doing well.  A: Controlled Type 2 DM, with peripheral vascular disease  P: Continue Metformin 1g BID and januvia 100mg  daily In future if able to afford it would be great to substitute GLP-1a for DDP4 given his extensive CAD history

## 2017-12-01 NOTE — Assessment & Plan Note (Signed)
HPI: He continues to have persistent caudication symptoms, he has been following with Dr Gwenlyn Found Dr Oval Linsey.   Dr Oval Linsey recently asked him to follow up with me to discuss possible neurologic caudication given his mild disease on ABI. For me he denies any significant back pain, pain is located in bilateral calfs, worse with walking, resolves in about 1 minute with rest.  Present when pushing shopping cart.  No present when standing erect for long periods.   He has not started any formal exercise program. SLR negative bilaterally.  A: Claudication  P: I suspect his claudication is most likely due to PVD rather than neurologic claudication (although this is a possibility).  Will continue to monitor.  I asked him today to start specific walking exercise program- at least 10 minutes a day.

## 2017-12-01 NOTE — Assessment & Plan Note (Signed)
Recheck CBC and ferritin 

## 2017-12-01 NOTE — Assessment & Plan Note (Signed)
HPI: No complaints, has not taken meds today.  A: Essential HTN  P: BP has been well controlled, slightly elevated today- will not make any changes today.

## 2017-12-01 NOTE — Assessment & Plan Note (Signed)
HPI: Following with Dr Oval Linsey.  No target candiates on cath.  Currently on medical management>> still haiving chest pain, typically at night and in early morning.  Dr Oval Linsey feels this may be due to GERD, asked him to increase PPI to BID.  He seems to have misunderstood this recommendation and has not increased.  On EGD last year he was noted to have a 2cm hiatal hernia.  A: CAD with angina  P: Cotninue medical management and follow up with Dr Oval Linsey.  I went over instructions again today for BID PPI (omeprazole)

## 2017-12-02 LAB — CBC WITH DIFFERENTIAL/PLATELET
BASOS: 0 %
Basophils Absolute: 0 10*3/uL (ref 0.0–0.2)
EOS (ABSOLUTE): 0.1 10*3/uL (ref 0.0–0.4)
EOS: 1 %
HEMOGLOBIN: 10.2 g/dL — AB (ref 13.0–17.7)
Hematocrit: 32.1 % — ABNORMAL LOW (ref 37.5–51.0)
IMMATURE GRANS (ABS): 0 10*3/uL (ref 0.0–0.1)
IMMATURE GRANULOCYTES: 0 %
LYMPHS: 48 %
Lymphocytes Absolute: 2.4 10*3/uL (ref 0.7–3.1)
MCH: 24.5 pg — ABNORMAL LOW (ref 26.6–33.0)
MCHC: 31.8 g/dL (ref 31.5–35.7)
MCV: 77 fL — AB (ref 79–97)
MONOCYTES: 11 %
Monocytes Absolute: 0.6 10*3/uL (ref 0.1–0.9)
NEUTROS PCT: 40 %
Neutrophils Absolute: 2 10*3/uL (ref 1.4–7.0)
PLATELETS: 265 10*3/uL (ref 150–379)
RBC: 4.17 x10E6/uL (ref 4.14–5.80)
RDW: 19 % — ABNORMAL HIGH (ref 12.3–15.4)
WBC: 5.1 10*3/uL (ref 3.4–10.8)

## 2017-12-02 LAB — FERRITIN: FERRITIN: 15 ng/mL — AB (ref 30–400)

## 2017-12-02 MED ORDER — FERROUS SULFATE 325 (65 FE) MG PO TABS: 325.0000 mg | ORAL_TABLET | ORAL | 0 refills | Status: DC

## 2017-12-02 NOTE — Addendum Note (Signed)
Addended by: Joni Reining C on: 12/02/2017 12:48 PM   Modules accepted: Orders

## 2017-12-21 ENCOUNTER — Other Ambulatory Visit: Payer: Self-pay | Admitting: Cardiovascular Disease

## 2017-12-22 ENCOUNTER — Telehealth: Payer: Self-pay | Admitting: Cardiovascular Disease

## 2017-12-22 NOTE — Telephone Encounter (Signed)
New Message   *STAT* If patient is at the pharmacy, call can be transferred to refill team.   1. Which medications need to be refilled? (please list name of each medication and dose if known) nitroGLYCERIN (NITROSTAT) 0.4 MG SL tablet  2. Which pharmacy/location (including street and city if local pharmacy) is medication to be sent to? [place], [place] - [address]  3. Do they need a 30 day or 90 day supply? [place]

## 2017-12-22 NOTE — Telephone Encounter (Signed)
Please review for refill, Thanks !  

## 2017-12-23 MED ORDER — NITROGLYCERIN 0.4 MG SL SUBL: 0.4000 mg | SUBLINGUAL_TABLET | SUBLINGUAL | 3 refills | Status: DC | PRN

## 2017-12-30 MED FILL — PANTOPRAZOLE SOD DR 40 MG T: 40 | 30 days supply | Qty: 30 | Fill #5

## 2017-12-30 MED FILL — METOPROLOL SUCC ER 200 MG T: 200 | 30 days supply | Qty: 30 | Fill #1

## 2017-12-30 MED FILL — JANUVIA 100 MG TABLET: 100 | 30 days supply | Qty: 30 | Fill #3

## 2018-01-02 ENCOUNTER — Ambulatory Visit: Payer: Self-pay | Attending: Internal Medicine | Admitting: Podiatry

## 2018-01-02 DIAGNOSIS — E119 Type 2 diabetes mellitus without complications: Secondary | ICD-10-CM

## 2018-01-02 DIAGNOSIS — B351 Tinea unguium: Secondary | ICD-10-CM

## 2018-01-03 NOTE — Progress Notes (Signed)
  Subjective:  Patient ID: Jason Velasquez, male    DOB: 11-May-1956,  MRN: [id number]  No chief complaint on file.  62 y.o. male presets for care of his nails. Reports diabetes, unsure of AMBS. Denies numbness tingling burning throbbing to his feet.  Objective:  There were no vitals filed for this visit. General AA&O x3. Normal mood and affect.  Vascular Pedal pulses palpable.  Neurologic Epicritic sensation grossly intact.  Dermatologic No open lesions. Skin normal texture and turgor.  Nails x10 elongated dystrophic with subungual debris.  Orthopedic: No pain to palpation either foot.   Assessment & Plan:  Patient was evaluated and treated and all questions answered.  DM with Onychomycosis -Nails debrided x10 with large nail nipper. -Educated on DM care.  F/u PRN.  No follow-ups on file.

## 2018-01-10 ENCOUNTER — Ambulatory Visit (INDEPENDENT_AMBULATORY_CARE_PROVIDER_SITE_OTHER): Payer: No Typology Code available for payment source | Admitting: Cardiovascular Disease

## 2018-01-10 ENCOUNTER — Encounter: Payer: Self-pay | Admitting: Cardiovascular Disease

## 2018-01-10 VITALS — BP 116/79 | HR 81 | Ht 70.5 in | Wt 214.8 lb

## 2018-01-10 DIAGNOSIS — I6523 Occlusion and stenosis of bilateral carotid arteries: Secondary | ICD-10-CM

## 2018-01-10 DIAGNOSIS — D500 Iron deficiency anemia secondary to blood loss (chronic): Secondary | ICD-10-CM

## 2018-01-10 DIAGNOSIS — I209 Angina pectoris, unspecified: Secondary | ICD-10-CM

## 2018-01-10 DIAGNOSIS — E7800 Pure hypercholesterolemia, unspecified: Secondary | ICD-10-CM

## 2018-01-10 DIAGNOSIS — Z955 Presence of coronary angioplasty implant and graft: Secondary | ICD-10-CM

## 2018-01-10 DIAGNOSIS — I119 Hypertensive heart disease without heart failure: Secondary | ICD-10-CM

## 2018-01-10 NOTE — Progress Notes (Signed)
Cardiology Office Note   Date:  01/11/2018   ID:  Jason Velasquez, DOB 11-07-1955, MRN [id number]  PCP:  [name], DO  Cardiologist:   Skeet Latch, MD   No chief complaint on file.       Patient ID: Jason Velasquez is a 62 y.o. male with CAD s/p CABG ( LIMA-->LAD, SVG-->PDA, SVG-->OM1), multiple PCIs , chronic stable angina, PAD, hypertension, diabetes and CVA who presents for follow up.  Jason Velasquez was first evaluated for chest pain on 07/27/15.  He previously underwent CABG in 2007. He had a nuclear stress test on 08/21/15 revealed LVEF 57% with moderate ischemia in the inferoseptal, inferior, and anterior locations. He subsequently underwent left heart catheterization on 08/26/15 that revealed severe three vessel disease including a 90% left main lesion and an occluded RCA and LAD. His LIMA to the LAD was patent but all other vein grafts were occluded. There were no optimal PCI targets.  He was started on carvedilol and Ranexa. Jason Velasquez also underwent ABI testing 08/2015 that revealed diffuse atherosclerosis from the proximal abdominal aorta through the common and external iliac arteries bilaterally. There was no detectable flow in the L peroneal or anterior tibial arteries. ABIs were near 0.8 bilaterally. He was referred to Dr. Gwenlyn Found where medical management was advised. He was noted to have carotid bruits and was referred for carotid ultrasound on 12/09/15.  This revealed 40-59% stenosis on the right and 1-39% stenosis on the left.  He had repeat LE Dopplers and ABIs 03/2017 that were unchanged from prior.  Medical management was recommended.    Jason Velasquez continued to have worsening angina despite optimal medical therapy.  He was referred for [facility]  05/2017 where he was found to have progression of the distal LM/LAD/LCx lesion.  He underwent DES placement in the ostial LCx, mid LCx, and ostial LAD.  The ostial LAD was treated with a cutting ballpon reducing the stenosis from 85% to 30%.   It was recommended that he continue clopidogrel and aspirin indefinitely.  He had an echo 06/01/17 that showed LVEF 65-70% with grade 1 diastolic dysfunction.  He followed up on 10/29 and continued to have angina especially at night.  There is some concern that it may also be GERD.  Therefore he was given a trial of pantoprazole and Imdur 60 mg daily was restarted.  He called our office 11/7 with progressive symptoms.  He was referred for cardiac catheterization but was found to be anemic with a hemoglobin of 7.7.  He was transfused and underwent upper endoscopy on 11/9.  He was found to have a hiatal hernia and 2 AVMs that were treated with APC.  He also received IV iron.  He subsequently underwent left heart catheterization that again showed severe three-vessel disease not amenable to PCI.    At his last appointment Jason Velasquez had persistent chest pain worse at night.  Imdur was increased to 120mg  qhs.  Omeprazole was also increased to BID.  He has noted some improvement in his ability to sleep at night.  He still sometimes awakens with chest pain and has to sleep on the couch.  He also sometimes has chest pain when rolling over in the bed or when stretching.  Jason Velasquez also notes significant shortness of breath and chest pain whenever he pushes himself.  He is unable to walk to his mailbox, which is 40 feet away without getting chest pressure and shortness of breath.  He tried to  walk to his neighbor's house and back and had to stop and rest on the way.  He has no lower extremity edema, orthopnea, or PND.  These episodes typically occur 3 or 4 days/week and are alleviated with nitroglycerin.   Past Medical History:  Diagnosis Date  . Angina, class III ([place]) 08/24/2015  . AVM (arteriovenous malformation) 08/15/2017  . CAD (coronary artery disease) of artery bypass graft 06/25/2014   CABG- 2007   . CAD (coronary artery disease), native coronary artery 06/25/2014   CABG- 2007   . Carotid stenosis  03/03/2016   38-25% RICA, 0-53% LICA, >97% RECA stenosis Repeat 12/2016.  . Claudication ([place])   . Controlled type 2 diabetes mellitus without complication ([place]) 03/10/3418   Dx March of 2016. GAD65 negative Anti Islet cell negative    . DKA (diabetic ketoacidoses) ([place]) 12/07/2014  . Dyspnea   . History of CVA (cerebrovascular accident) 07/12/2014  . Hx of adenomatous colonic polyps 05/27/2015  . Hypercholesterolemia   . Hypertension   . Hypertensive heart disease 03/03/2016  . S/P CABG (coronary artery bypass graft) 03/28/2015   2007 at [facility]   . Stroke ([place])    2015  . Tubular adenoma of colon 05/31/2015   Colonoscopy 05/20/15 with [name]: 3 tubullar adenoma's largest 2.3cm.  Plan for repeat Colonoscopy in 2019.    Past Surgical History:  Procedure Laterality Date  . CARDIAC CATHETERIZATION N/A 08/26/2015   Procedure: Left Heart Cath and Cors/Grafts Angiography;  Surgeon: [name], MD;  Location: [place] [facility];  Service: Cardiovascular;  Laterality: N/A;  . CARDIAC SURGERY    . CORONARY ARTERY BYPASS GRAFT    . CORONARY BALLOON ANGIOPLASTY N/A 05/25/2017   Procedure: CORONARY BALLOON ANGIOPLASTY;  Surgeon: [name], MD;  Location: [place] [facility];  Service: Cardiovascular;  Laterality: N/A;  . CORONARY STENT INTERVENTION N/A 05/25/2017   Procedure: CORONARY STENT INTERVENTION;  Surgeon: [name], MD;  Location: [place] [facility];  Service: Cardiovascular;  Laterality: N/A;  . ESOPHAGOGASTRODUODENOSCOPY N/A 08/12/2017   Procedure: ESOPHAGOGASTRODUODENOSCOPY (EGD);  Surgeon: Jerene Bears, MD;  Location: [facility];  Service: Gastroenterology;  Laterality: N/A;  . LEFT HEART CATH AND CORS/GRAFTS ANGIOGRAPHY N/A 05/23/2017   Procedure: LEFT HEART CATH AND CORS/GRAFTS ANGIOGRAPHY;  Surgeon: [name], MD;  Location: [place] [facility];  Service: Cardiovascular;  Laterality: N/A;  . LEFT HEART CATH AND CORS/GRAFTS ANGIOGRAPHY N/A  08/15/2017   Procedure: LEFT HEART CATH AND CORS/GRAFTS ANGIOGRAPHY;  Surgeon: Burnell Blanks, MD;  Location: [place] [facility];  Service: Cardiovascular;  Laterality: N/A;    Current Outpatient Medications  Medication Sig Dispense Refill  . acetaminophen (TYLENOL) 325 MG tablet Take 2 tablets (650 mg total) every 4 (four) hours as needed by mouth for headache or mild pain.    Marland Kitchen aspirin EC 81 MG tablet Take 81 mg by mouth daily.    Marland Kitchen atorvastatin (LIPITOR) 40 MG tablet Take 1 tablet (40 mg total) by mouth every evening. 30 tablet 11  . clopidogrel (PLAVIX) 75 MG tablet Take 1 tablet (75 mg total) by mouth daily with breakfast. 30 tablet 11  . ferrous sulfate 325 (65 FE) MG tablet Take 1 tablet (325 mg total) by mouth every other day. 100 tablet 0  . Hypromellose (ARTIFICIAL TEARS OP) Apply 1 drop daily as needed to eye (dry eyes).    . isosorbide mononitrate (IMDUR) 120 MG 24 hr tablet 1/2 TABLET BY  MOUTH IN THE MORNING AND 1 IN THE EVENING 45 tablet 5  . Lancets MISC 1 Device by Does not apply route 4 (four) times daily -  before meals and at bedtime. 100 each 11  . lisinopril (PRINIVIL,ZESTRIL) 10 MG tablet TAKE 1 TABLET BY MOUTH DAILY. 90 tablet 3  . metFORMIN (GLUCOPHAGE) 1000 MG tablet Take 1 tablet (1,000 mg total) 2 (two) times daily with a meal by mouth. 60 tablet 11  . metoprolol (TOPROL-XL) 200 MG 24 hr tablet TAKE 1 TABLET BY MOUTH DAILY. TAKE WITH OR IMMEDIATELY FOLLOWING A MEAL. 30 tablet 11  . nitroGLYCERIN (NITROSTAT) 0.4 MG SL tablet Place 1 tablet (0.4 mg total) under the tongue every 5 (five) minutes as needed for chest pain. 75 tablet 3  . omeprazole (PRILOSEC) 40 MG capsule Take 40 mg by mouth 2 (two) times daily.    . ranolazine (RANEXA) 1000 MG SR tablet Take 1,000 mg by mouth 2 (two) times daily.    . sitaGLIPtin (JANUVIA) 100 MG tablet Take 1 tablet (100 mg total) by mouth daily. 90 tablet 3   No current facility-administered medications for this visit.      Allergies:   Patient has no known allergies.    Social History:  The patient  reports that he quit smoking about 8 years ago. He has never used smokeless tobacco. He reports that he does not drink alcohol or use drugs.   Family History:  The patient's family history includes Diabetes in his father; Hypertension in his mother and sister.    ROS:  Please see the history of present illness.   Otherwise, review of systems are positive for none.   All other systems are reviewed and negative.    PHYSICAL EXAM: VS:  BP 116/79   Pulse 81   Ht 5' 10.5" (1.791 m)   Wt 214 lb 12.8 oz (97.4 kg)   SpO2 97%   BMI 30.38 kg/m  , BMI Body mass index is 30.38 kg/m. GENERAL:  Well appearing HEENT: Pupils equal round and reactive, fundi not visualized, oral mucosa unremarkable NECK:  No jugular venous distention, waveform within normal limits, carotid upstroke brisk and symmetric, + carotid bruits LUNGS:  Clear to auscultation bilaterally HEART:  RRR.  PMI not displaced or sustained,S1 and S2 within normal limits, no S3, no S4, no clicks, no rubs, no murmurs ABD:  Flat, positive bowel sounds normal in frequency in pitch, no bruits, no rebound, no guarding, no midline pulsatile mass, no hepatomegaly, no splenomegaly EXT:  2 plus pulses throughout, no edema, no cyanosis no clubbing SKIN:  No rashes no nodules NEURO:  Cranial nerves II through XII grossly intact, motor grossly intact throughout PSYCH:  Cognitively intact, oriented to person place and time   EKG:  EKG is not ordered today. The ekg ordered today demonstrates sinus rhythm rate 82 bpm.  Prior inferior infarct.  07/02/16: Sinus rhythm rate 75 bpm.  Prior inferior infarct  12/29/16: Sinus rhythm.  Incomplete RBBB.  Prior inferior infarct.  05/18/17: Sinus rhythm. Rate 87 bpm. B 1 PVCs. 07/18/17: Sinus rhythm.  Rate 71 bpm.  08/11/17: Sinus rhythm.  Rate 77 bpm.     LHC 08/15/17:  Mid RCA lesion is 100% stenosed.  Ost RCA to Prox RCA  lesion is 70% stenosed.  Ost 1st Mrg lesion is 60% stenosed.  Previously placed Ost 1st Mrg to 1st Mrg stent (unknown type) is widely patent.  Origin lesion is 100% stenosed.  Origin lesion is  100% stenosed.  Prox Graft lesion is 20% stenosed.  Prox LAD to Mid LAD lesion is 100% stenosed.  Previously placed Ost Cx to Prox Cx stent (unknown type) is widely patent.  Previously placed Ost LM to Dist LM stent (unknown type) is widely patent.  Previously placed Prox Cx to Mid Cx stent (unknown type) is widely patent.  SVG graft was not visualized.  SVG graft was not visualized.  Post intervention, there is a -1% residual stenosis.  LIMA graft was visualized by angiography and is normal in caliber.  The graft exhibits minimal luminal irregularities.  Post intervention, there is a -1% residual stenosis.  Ost LAD to Prox LAD lesion is 99% stenosed.  Post intervention, there is a -1% residual stenosis.   1. Severe triple vessel CAD s/p CABG with 1/3 patent bypass grafts.  2. The LAD is known to be chronically occluded in the mid segment. The mid and distal LAD fills from the patent LIMA graft. The ostial LAD is jailed by the stent extending from the left main into the Circumflex. The proximal LAD has a 99% stenosis following by an aneurysmal segment then a total occlusion.  3. The left main stent is patent 4. The ostial/proximal Circumflex stent is patent. The first OM stent is patent. There is a 60% ostial stenosis in the first OM branch just prior to the stent. The SVG to the OM is known to be occluded. The mid Circumflex stent is patent.  5. The native RCA has a long 70% proximal stenosis and 100% mid chronic occlusion. The vein graft the to distal RCA is known to be occluded and was not injected.  6. Normal filling pressures  Recommendations: Continue medical management of CAD. No focal targets for PCI. The proximal LAD stenosis is severe but the majority of this territory is  supplied by the LIMA graft and the LAD is jailed by the LM/Circ stent. I suspect that his anemia has contributed to his recent chest pain given his severe, diffuse CAD.    Echo 06/01/17: Study Conclusions  - Procedure narrative: Transthoracic echocardiography. Image   quality was poor. Intravenous contrast (Definity) was   administered. - Left ventricle: The cavity size was normal. There was mild focal   basal hypertrophy of the septum. Systolic function was vigorous.   The estimated ejection fraction was in the range of 65% to 70%.   Wall motion was normal; there were no regional wall motion   abnormalities. Doppler parameters are consistent with abnormal   left ventricular relaxation (grade 1 diastolic dysfunction). - Aortic valve: Trileaflet; mildly thickened, mildly calcified   leaflets. - Left atrium: The atrium was mildly dilated.   Lexiscan Cardiolite 08/21/15:  Nuclear stress EF: 57%.  The left ventricular ejection fraction is normal (55-65%).  There was no ST segment deviation noted during stress.  No T wave inversion was noted during stress.  Defect 1: There is a small defect of moderate severity present in the basal inferoseptal and basal inferior location.  Defect 2: There is a defect present in the basal anterior, mid anterior and apical anterior location.  Findings consistent with ischemia.  This is a high risk study.  High risk stress nuclear study with two areas of ischemia and normal left ventricular regional and global systolic function. The inferior area of ischemia is small. The anterior area of ischemia is extensive, but appears to be very mild. The septum is spared.  Carotid Doppler 12/11/15: 50-93% RICA, 2-67% LICA, >12%  RECA stenosis  ABI 08/12/15: R 0.81, L 0.89 Diffuse atherosclerosis from the proximal abdominal aorta into both tibial arteries. Several areas of narrowing are noted in both lower extremities, although no focal significant  stenosis is identified. One vessel run-off on the right, and three vessel run-off on the left.  ABI 02/03/17: R 0.81, L 0.91 Abnormal lower arterial Doppler study, with waveforms suggestive of inflow disease. The bilateral ABI's are stable and in the mild range. The right great toe pressure is normal. The left great toe pressure is abnormal   Recent Labs: 08/15/2017: BUN 15; Creatinine, Ser 1.15; Potassium 3.7; Sodium 135 12/01/2017: Hemoglobin 10.2; Platelets 265    Lipid Panel    Component Value Date/Time   CHOL 93 08/12/2017 0334   CHOL 129 07/10/2015 1418   TRIG 157 (H) 08/12/2017 0334   HDL 26 (L) 08/12/2017 0334   HDL 21 (L) 07/10/2015 1418   CHOLHDL 3.6 08/12/2017 0334   VLDL 31 08/12/2017 0334   LDLCALC 36 08/12/2017 0334   LDLCALC 45 07/10/2015 1418      Wt Readings from Last 3 Encounters:  01/10/18 214 lb 12.8 oz (97.4 kg)  12/01/17 216 lb 1.6 oz (98 kg)  11/21/17 211 lb 12.8 oz (96.1 kg)      ASSESSMENT AND PLAN:  # CAD s/p CABG:  # Angina:  Jason Velasquez has severe 3 vessel CAD and all his CABG grafts are occluded with the exception of his LIMA to the LAD.  He underwent successful PCI of the LM, LCx and OM1.  Repeat cardiac catheterization 08/2017 still revealed no targets.  He has been taking Imdur 60 mg in the am, 120mg  int he evening.  He continues to have exertional chest pain.  He is very frustrated by his persistent symptoms.  I do not think that his pain when lying in the bed or when stretching is related to his heart.  This is more likely related to something musculoskeletal or his hiatal hernia.  I will ask one of our interventional cardiologist to evaluate his films and see if there is anything that can possibly done.  For now, continue aspirin, atorvastatin, clopidogrel, Imdur, lisinopril, metoprolol and ranolazine.    # Hypertension: Blood pressure well-controlled.  Continue lisinopril, metoprolol, and  Imdur as above.   # Hyperlipidemia:  Continue  atorvastatin and fish oil. LDL 36 08/2017.    # Claudication: # PAD:  ABIs 0.8 bilaterally and medical management was recommended in 2016.  Unchanged on repeat.  Encouraged ambulation. Repeat ABIs 03/2018.  Continue aspirin and statin.  Dr. Heber Norwich felt symptoms were more PAD related than sciatica.  He recommended walking regimen, which would help both.  However he has been limited by angina.   # Carotid stenosis: Carotid Dopplers 02/2017 revealed 50-49% R ICA stenosis and 1-39% stenosis on the left.  Continue aspirin and atorvastatin.   Current medicines are reviewed at length with the patient today.  The patient does not have concerns regarding medicines.  The following changes have been made:  none  Labs/ tests ordered today include:   No orders of the defined types were placed in this encounter.    Disposition:   FU with Charie Pinkus C. Oval Linsey, MD in 4 months.   Signed, Skeet Latch, MD  01/11/2018 11:04 AM    [place]

## 2018-01-10 NOTE — H&P (View-Only) (Signed)
Cardiology Office Note   Date:  01/11/2018   ID:  Jason Velasquez, DOB Apr 08, 1956, MRN [id number]  PCP:  [name], DO  Cardiologist:   Skeet Latch, MD   No chief complaint on file.       Patient ID: Jason Velasquez is a 62 y.o. male with CAD s/p CABG ( LIMA-->LAD, SVG-->PDA, SVG-->OM1), multiple PCIs , chronic stable angina, PAD, hypertension, diabetes and CVA who presents for follow up.  Jason Velasquez was first evaluated for chest pain on 07/27/15.  He previously underwent CABG in 2007. He had a nuclear stress test on 08/21/15 revealed LVEF 57% with moderate ischemia in the inferoseptal, inferior, and anterior locations. He subsequently underwent left heart catheterization on 08/26/15 that revealed severe three vessel disease including a 90% left main lesion and an occluded RCA and LAD. His LIMA to the LAD was patent but all other vein grafts were occluded. There were no optimal PCI targets.  He was started on carvedilol and Ranexa. Jason Velasquez also underwent ABI testing 08/2015 that revealed diffuse atherosclerosis from the proximal abdominal aorta through the common and external iliac arteries bilaterally. There was no detectable flow in the L peroneal or anterior tibial arteries. ABIs were near 0.8 bilaterally. He was referred to Dr. Gwenlyn Found where medical management was advised. He was noted to have carotid bruits and was referred for carotid ultrasound on 12/09/15.  This revealed 40-59% stenosis on the right and 1-39% stenosis on the left.  He had repeat LE Dopplers and ABIs 03/2017 that were unchanged from prior.  Medical management was recommended.    Jason Velasquez continued to have worsening angina despite optimal medical therapy.  He was referred for [facility]  05/2017 where he was found to have progression of the distal LM/LAD/LCx lesion.  He underwent DES placement in the ostial LCx, mid LCx, and ostial LAD.  The ostial LAD was treated with a cutting ballpon reducing the stenosis from 85% to 30%.   It was recommended that he continue clopidogrel and aspirin indefinitely.  He had an echo 06/01/17 that showed LVEF 65-70% with grade 1 diastolic dysfunction.  He followed up on 10/29 and continued to have angina especially at night.  There is some concern that it may also be GERD.  Therefore he was given a trial of pantoprazole and Imdur 60 mg daily was restarted.  He called our office 11/7 with progressive symptoms.  He was referred for cardiac catheterization but was found to be anemic with a hemoglobin of 7.7.  He was transfused and underwent upper endoscopy on 11/9.  He was found to have a hiatal hernia and 2 AVMs that were treated with APC.  He also received IV iron.  He subsequently underwent left heart catheterization that again showed severe three-vessel disease not amenable to PCI.    At his last appointment Jason Velasquez had persistent chest pain worse at night.  Imdur was increased to 120mg  qhs.  Omeprazole was also increased to BID.  He has noted some improvement in his ability to sleep at night.  He still sometimes awakens with chest pain and has to sleep on the couch.  He also sometimes has chest pain when rolling over in the bed or when stretching.  Jason Velasquez also notes significant shortness of breath and chest pain whenever he pushes himself.  He is unable to walk to his mailbox, which is 40 feet away without getting chest pressure and shortness of breath.  He tried to  walk to his neighbor's house and back and had to stop and rest on the way.  He has no lower extremity edema, orthopnea, or PND.  These episodes typically occur 3 or 4 days/week and are alleviated with nitroglycerin.   Past Medical History:  Diagnosis Date  . Angina, class III ([place]) 08/24/2015  . AVM (arteriovenous malformation) 08/15/2017  . CAD (coronary artery disease) of artery bypass graft 06/25/2014   CABG- 2007   . CAD (coronary artery disease), native coronary artery 06/25/2014   CABG- 2007   . Carotid stenosis  03/03/2016   37-10% RICA, 6-26% LICA, >94% RECA stenosis Repeat 12/2016.  . Claudication ([place])   . Controlled type 2 diabetes mellitus without complication ([place]) 05/08/4626   Dx March of 2016. GAD65 negative Anti Islet cell negative    . DKA (diabetic ketoacidoses) ([place]) 12/07/2014  . Dyspnea   . History of CVA (cerebrovascular accident) 07/12/2014  . Hx of adenomatous colonic polyps 05/27/2015  . Hypercholesterolemia   . Hypertension   . Hypertensive heart disease 03/03/2016  . S/P CABG (coronary artery bypass graft) 03/28/2015   2007 at [place] [facility]   . Stroke ([place])    2015  . Tubular adenoma of colon 05/31/2015   Colonoscopy 05/20/15 with [name]: 3 tubullar adenoma's largest 2.3cm.  Plan for repeat Colonoscopy in 2019.    Past Surgical History:  Procedure Laterality Date  . CARDIAC CATHETERIZATION N/A 08/26/2015   Procedure: Left Heart Cath and Cors/Grafts Angiography;  Surgeon: [name], MD;  Location: [place] [facility];  Service: Cardiovascular;  Laterality: N/A;  . CARDIAC SURGERY    . CORONARY ARTERY BYPASS GRAFT    . CORONARY BALLOON ANGIOPLASTY N/A 05/25/2017   Procedure: CORONARY BALLOON ANGIOPLASTY;  Surgeon: [name], MD;  Location: [place] [facility];  Service: Cardiovascular;  Laterality: N/A;  . CORONARY STENT INTERVENTION N/A 05/25/2017   Procedure: CORONARY STENT INTERVENTION;  Surgeon: [name], MD;  Location: [place] [facility];  Service: Cardiovascular;  Laterality: N/A;  . ESOPHAGOGASTRODUODENOSCOPY N/A 08/12/2017   Procedure: ESOPHAGOGASTRODUODENOSCOPY (EGD);  Surgeon: Jerene Bears, MD;  Location[facility];  Service: Gastroenterology;  Laterality: N/A;  . LEFT HEART CATH AND CORS/GRAFTS ANGIOGRAPHY N/A 05/23/2017   Procedure: LEFT HEART CATH AND CORS/GRAFTS ANGIOGRAPHY;  Surgeon: [name], MD;  Location: [place] [facility];  Service: Cardiovascular;  Laterality: N/A;  . LEFT HEART CATH AND CORS/GRAFTS ANGIOGRAPHY N/A  08/15/2017   Procedure: LEFT HEART CATH AND CORS/GRAFTS ANGIOGRAPHY;  Surgeon: Burnell Blanks, MD;  Location: [place] [facility];  Service: Cardiovascular;  Laterality: N/A;    Current Outpatient Medications  Medication Sig Dispense Refill  . acetaminophen (TYLENOL) 325 MG tablet Take 2 tablets (650 mg total) every 4 (four) hours as needed by mouth for headache or mild pain.    Marland Kitchen aspirin EC 81 MG tablet Take 81 mg by mouth daily.    Marland Kitchen atorvastatin (LIPITOR) 40 MG tablet Take 1 tablet (40 mg total) by mouth every evening. 30 tablet 11  . clopidogrel (PLAVIX) 75 MG tablet Take 1 tablet (75 mg total) by mouth daily with breakfast. 30 tablet 11  . ferrous sulfate 325 (65 FE) MG tablet Take 1 tablet (325 mg total) by mouth every other day. 100 tablet 0  . Hypromellose (ARTIFICIAL TEARS OP) Apply 1 drop daily as needed to eye (dry eyes).    . isosorbide mononitrate (IMDUR) 120 MG 24 hr tablet 1/2 TABLET BY  MOUTH IN THE MORNING AND 1 IN THE EVENING 45 tablet 5  . Lancets MISC 1 Device by Does not apply route 4 (four) times daily -  before meals and at bedtime. 100 each 11  . lisinopril (PRINIVIL,ZESTRIL) 10 MG tablet TAKE 1 TABLET BY MOUTH DAILY. 90 tablet 3  . metFORMIN (GLUCOPHAGE) 1000 MG tablet Take 1 tablet (1,000 mg total) 2 (two) times daily with a meal by mouth. 60 tablet 11  . metoprolol (TOPROL-XL) 200 MG 24 hr tablet TAKE 1 TABLET BY MOUTH DAILY. TAKE WITH OR IMMEDIATELY FOLLOWING A MEAL. 30 tablet 11  . nitroGLYCERIN (NITROSTAT) 0.4 MG SL tablet Place 1 tablet (0.4 mg total) under the tongue every 5 (five) minutes as needed for chest pain. 75 tablet 3  . omeprazole (PRILOSEC) 40 MG capsule Take 40 mg by mouth 2 (two) times daily.    . ranolazine (RANEXA) 1000 MG SR tablet Take 1,000 mg by mouth 2 (two) times daily.    . sitaGLIPtin (JANUVIA) 100 MG tablet Take 1 tablet (100 mg total) by mouth daily. 90 tablet 3   No current facility-administered medications for this visit.      Allergies:   Patient has no known allergies.    Social History:  The patient  reports that he quit smoking about 8 years ago. He has never used smokeless tobacco. He reports that he does not drink alcohol or use drugs.   Family History:  The patient's family history includes Diabetes in his father; Hypertension in his mother and sister.    ROS:  Please see the history of present illness.   Otherwise, review of systems are positive for none.   All other systems are reviewed and negative.    PHYSICAL EXAM: VS:  BP 116/79   Pulse 81   Ht 5' 10.5" (1.791 m)   Wt 214 lb 12.8 oz (97.4 kg)   SpO2 97%   BMI 30.38 kg/m  , BMI Body mass index is 30.38 kg/m. GENERAL:  Well appearing HEENT: Pupils equal round and reactive, fundi not visualized, oral mucosa unremarkable NECK:  No jugular venous distention, waveform within normal limits, carotid upstroke brisk and symmetric, + carotid bruits LUNGS:  Clear to auscultation bilaterally HEART:  RRR.  PMI not displaced or sustained,S1 and S2 within normal limits, no S3, no S4, no clicks, no rubs, no murmurs ABD:  Flat, positive bowel sounds normal in frequency in pitch, no bruits, no rebound, no guarding, no midline pulsatile mass, no hepatomegaly, no splenomegaly EXT:  2 plus pulses throughout, no edema, no cyanosis no clubbing SKIN:  No rashes no nodules NEURO:  Cranial nerves II through XII grossly intact, motor grossly intact throughout PSYCH:  Cognitively intact, oriented to person place and time   EKG:  EKG is not ordered today. The ekg ordered today demonstrates sinus rhythm rate 82 bpm.  Prior inferior infarct.  07/02/16: Sinus rhythm rate 75 bpm.  Prior inferior infarct  12/29/16: Sinus rhythm.  Incomplete RBBB.  Prior inferior infarct.  05/18/17: Sinus rhythm. Rate 87 bpm. B 1 PVCs. 07/18/17: Sinus rhythm.  Rate 71 bpm.  08/11/17: Sinus rhythm.  Rate 77 bpm.     LHC 08/15/17:  Mid RCA lesion is 100% stenosed.  Ost RCA to Prox RCA  lesion is 70% stenosed.  Ost 1st Mrg lesion is 60% stenosed.  Previously placed Ost 1st Mrg to 1st Mrg stent (unknown type) is widely patent.  Origin lesion is 100% stenosed.  Origin lesion is  100% stenosed.  Prox Graft lesion is 20% stenosed.  Prox LAD to Mid LAD lesion is 100% stenosed.  Previously placed Ost Cx to Prox Cx stent (unknown type) is widely patent.  Previously placed Ost LM to Dist LM stent (unknown type) is widely patent.  Previously placed Prox Cx to Mid Cx stent (unknown type) is widely patent.  SVG graft was not visualized.  SVG graft was not visualized.  Post intervention, there is a -1% residual stenosis.  LIMA graft was visualized by angiography and is normal in caliber.  The graft exhibits minimal luminal irregularities.  Post intervention, there is a -1% residual stenosis.  Ost LAD to Prox LAD lesion is 99% stenosed.  Post intervention, there is a -1% residual stenosis.   1. Severe triple vessel CAD s/p CABG with 1/3 patent bypass grafts.  2. The LAD is known to be chronically occluded in the mid segment. The mid and distal LAD fills from the patent LIMA graft. The ostial LAD is jailed by the stent extending from the left main into the Circumflex. The proximal LAD has a 99% stenosis following by an aneurysmal segment then a total occlusion.  3. The left main stent is patent 4. The ostial/proximal Circumflex stent is patent. The first OM stent is patent. There is a 60% ostial stenosis in the first OM branch just prior to the stent. The SVG to the OM is known to be occluded. The mid Circumflex stent is patent.  5. The native RCA has a long 70% proximal stenosis and 100% mid chronic occlusion. The vein graft the to distal RCA is known to be occluded and was not injected.  6. Normal filling pressures  Recommendations: Continue medical management of CAD. No focal targets for PCI. The proximal LAD stenosis is severe but the majority of this territory is  supplied by the LIMA graft and the LAD is jailed by the LM/Circ stent. I suspect that his anemia has contributed to his recent chest pain given his severe, diffuse CAD.    Echo 06/01/17: Study Conclusions  - Procedure narrative: Transthoracic echocardiography. Image   quality was poor. Intravenous contrast (Definity) was   administered. - Left ventricle: The cavity size was normal. There was mild focal   basal hypertrophy of the septum. Systolic function was vigorous.   The estimated ejection fraction was in the range of 65% to 70%.   Wall motion was normal; there were no regional wall motion   abnormalities. Doppler parameters are consistent with abnormal   left ventricular relaxation (grade 1 diastolic dysfunction). - Aortic valve: Trileaflet; mildly thickened, mildly calcified   leaflets. - Left atrium: The atrium was mildly dilated.   Lexiscan Cardiolite 08/21/15:  Nuclear stress EF: 57%.  The left ventricular ejection fraction is normal (55-65%).  There was no ST segment deviation noted during stress.  No T wave inversion was noted during stress.  Defect 1: There is a small defect of moderate severity present in the basal inferoseptal and basal inferior location.  Defect 2: There is a defect present in the basal anterior, mid anterior and apical anterior location.  Findings consistent with ischemia.  This is a high risk study.  High risk stress nuclear study with two areas of ischemia and normal left ventricular regional and global systolic function. The inferior area of ischemia is small. The anterior area of ischemia is extensive, but appears to be very mild. The septum is spared.  Carotid Doppler 12/11/15: 38-10% RICA, 1-75% LICA, >10%  RECA stenosis  ABI 08/12/15: R 0.81, L 0.89 Diffuse atherosclerosis from the proximal abdominal aorta into both tibial arteries. Several areas of narrowing are noted in both lower extremities, although no focal significant  stenosis is identified. One vessel run-off on the right, and three vessel run-off on the left.  ABI 02/03/17: R 0.81, L 0.91 Abnormal lower arterial Doppler study, with waveforms suggestive of inflow disease. The bilateral ABI's are stable and in the mild range. The right great toe pressure is normal. The left great toe pressure is abnormal   Recent Labs: 08/15/2017: BUN 15; Creatinine, Ser 1.15; Potassium 3.7; Sodium 135 12/01/2017: Hemoglobin 10.2; Platelets 265    Lipid Panel    Component Value Date/Time   CHOL 93 08/12/2017 0334   CHOL 129 07/10/2015 1418   TRIG 157 (H) 08/12/2017 0334   HDL 26 (L) 08/12/2017 0334   HDL 21 (L) 07/10/2015 1418   CHOLHDL 3.6 08/12/2017 0334   VLDL 31 08/12/2017 0334   LDLCALC 36 08/12/2017 0334   LDLCALC 45 07/10/2015 1418      Wt Readings from Last 3 Encounters:  01/10/18 214 lb 12.8 oz (97.4 kg)  12/01/17 216 lb 1.6 oz (98 kg)  11/21/17 211 lb 12.8 oz (96.1 kg)      ASSESSMENT AND PLAN:  # CAD s/p CABG:  # Angina:  Jason Velasquez has severe 3 vessel CAD and all his CABG grafts are occluded with the exception of his LIMA to the LAD.  He underwent successful PCI of the LM, LCx and OM1.  Repeat cardiac catheterization 08/2017 still revealed no targets.  He has been taking Imdur 60 mg in the am, 120mg  int he evening.  He continues to have exertional chest pain.  He is very frustrated by his persistent symptoms.  I do not think that his pain when lying in the bed or when stretching is related to his heart.  This is more likely related to something musculoskeletal or his hiatal hernia.  I will ask one of our interventional cardiologist to evaluate his films and see if there is anything that can possibly done.  For now, continue aspirin, atorvastatin, clopidogrel, Imdur, lisinopril, metoprolol and ranolazine.    # Hypertension: Blood pressure well-controlled.  Continue lisinopril, metoprolol, and  Imdur as above.   # Hyperlipidemia:  Continue  atorvastatin and fish oil. LDL 36 08/2017.    # Claudication: # PAD:  ABIs 0.8 bilaterally and medical management was recommended in 2016.  Unchanged on repeat.  Encouraged ambulation. Repeat ABIs 03/2018.  Continue aspirin and statin.  Dr. Heber [place] felt symptoms were more PAD related than sciatica.  He recommended walking regimen, which would help both.  However he has been limited by angina.   # Carotid stenosis: Carotid Dopplers 02/2017 revealed 50-49% R ICA stenosis and 1-39% stenosis on the left.  Continue aspirin and atorvastatin.   Current medicines are reviewed at length with the patient today.  The patient does not have concerns regarding medicines.  The following changes have been made:  none  Labs/ tests ordered today include:   No orders of the defined types were placed in this encounter.    Disposition:   FU with Xia Stohr C. Oval Linsey, MD in 4 months.   Signed, Skeet Latch, MD  01/11/2018 11:04 AM    [place]

## 2018-01-10 NOTE — Patient Instructions (Signed)
Medication Instructions:  Your physician recommends that you continue on your current medications as directed. Please refer to the Current Medication list given to you today.  Labwork: none  Testing/Procedures: none  Follow-Up: Your physician recommends that you schedule a follow-up appointment in: 4 month ov   If you need a refill on your cardiac medications before your next appointment, please call your pharmacy.  

## 2018-01-11 ENCOUNTER — Ambulatory Visit: Payer: No Typology Code available for payment source

## 2018-01-11 ENCOUNTER — Encounter: Payer: Self-pay | Admitting: Cardiovascular Disease

## 2018-01-12 MED FILL — LISINOPRIL 10 MG TABLET: 10 | 90 days supply | Qty: 90 | Fill #1

## 2018-01-13 ENCOUNTER — Telehealth: Payer: Self-pay | Admitting: *Deleted

## 2018-01-13 DIAGNOSIS — Z01812 Encounter for preprocedural laboratory examination: Secondary | ICD-10-CM

## 2018-01-13 DIAGNOSIS — I209 Angina pectoris, unspecified: Secondary | ICD-10-CM

## 2018-01-13 NOTE — Telephone Encounter (Signed)
Dr Oval Linsey discussed would like patient scheduled for cardiac cath with Dr Ellyn Hack, films viewed by Dr Ellyn Hack and Dr Tamala Julian. Left message to call back

## 2018-01-16 NOTE — Telephone Encounter (Signed)
Left message to call back  

## 2018-01-17 ENCOUNTER — Ambulatory Visit: Payer: No Typology Code available for payment source

## 2018-01-17 NOTE — Telephone Encounter (Signed)
Follow up: ° ° °Patient returning call  ° ° ° °

## 2018-01-17 NOTE — Telephone Encounter (Signed)
Spoke with patient and he is agreeable to cath. Will arrange and call patient back.

## 2018-01-17 NOTE — Telephone Encounter (Signed)
Routed to Melinda 

## 2018-01-18 ENCOUNTER — Encounter: Payer: Self-pay | Admitting: *Deleted

## 2018-01-18 NOTE — Telephone Encounter (Signed)
New message ° °Pt verbalized that he is returning call for RN °

## 2018-01-18 NOTE — Telephone Encounter (Signed)
Patient is scheduled for cardiac cath for 01/25/18 arrive at 6:30 for 8:30 am. Patient will come to office tomorrow for labs. Will pick up cath letter.

## 2018-01-20 LAB — BASIC METABOLIC PANEL
BUN/Creatinine Ratio: 12 (ref 10–24)
BUN: 13 mg/dL (ref 8–27)
CALCIUM: 9.1 mg/dL (ref 8.6–10.2)
CHLORIDE: 104 mmol/L (ref 96–106)
CO2: 22 mmol/L (ref 20–29)
Creatinine, Ser: 1.12 mg/dL (ref 0.76–1.27)
GFR calc Af Amer: 81 mL/min/{1.73_m2} (ref >59)
GFR calc non Af Amer: 70 mL/min/{1.73_m2} (ref >59)
GLUCOSE: 161 mg/dL — AB (ref 65–99)
POTASSIUM: 4.6 mmol/L (ref 3.5–5.2)
SODIUM: 140 mmol/L (ref 134–144)

## 2018-01-20 LAB — PROTIME-INR
INR: 1 (ref 0.8–1.2)
Prothrombin Time: 10.3 s (ref 9.1–12.0)

## 2018-01-20 LAB — CBC WITH DIFFERENTIAL/PLATELET
Basophils Absolute: 0 10*3/uL (ref 0.0–0.2)
Basos: 0 %
EOS (ABSOLUTE): 0.2 10*3/uL (ref 0.0–0.4)
Eos: 3 %
HEMATOCRIT: 30.8 % — AB (ref 37.5–51.0)
Hemoglobin: 9.7 g/dL — ABNORMAL LOW (ref 13.0–17.7)
IMMATURE GRANS (ABS): 0 10*3/uL (ref 0.0–0.1)
Immature Granulocytes: 0 %
LYMPHS ABS: 1.8 10*3/uL (ref 0.7–3.1)
LYMPHS: 31 %
MCH: 23 pg — ABNORMAL LOW (ref 26.6–33.0)
MCHC: 31.5 g/dL (ref 31.5–35.7)
MCV: 73 fL — ABNORMAL LOW (ref 79–97)
MONOCYTES: 10 %
Monocytes Absolute: 0.5 10*3/uL (ref 0.1–0.9)
Neutrophils Absolute: 3.1 10*3/uL (ref 1.4–7.0)
Neutrophils: 56 %
Platelets: 245 10*3/uL (ref 150–379)
RBC: 4.22 x10E6/uL (ref 4.14–5.80)
RDW: 17.8 % — AB (ref 12.3–15.4)
WBC: 5.6 10*3/uL (ref 3.4–10.8)

## 2018-01-23 ENCOUNTER — Ambulatory Visit: Payer: No Typology Code available for payment source

## 2018-01-24 ENCOUNTER — Telehealth: Payer: Self-pay | Admitting: *Deleted

## 2018-01-24 NOTE — Telephone Encounter (Signed)
Pt contacted pre-catheterization scheduled at [facility] for: Wednesday January 25, 2018 8:30 AM Verified arrival time and place: [place] at: 6:30 AM Nothing to eat or drink after midnight prior to procedure. Verified no known allergies.   Hold: Metformin 01/24/18 and 48 hours post cath Januvia AM of cath  Except hold medications AM meds can be  taken pre-cath with sip of water including: ASA 81 mg Clopidogrel 75 mg   Confirmed patient has responsible person to drive home post procedure and observe patient for 24 hours: yes

## 2018-01-25 ENCOUNTER — Ambulatory Visit (HOSPITAL_COMMUNITY)
Admission: RE | Disposition: A | Discharge status: Home / Self Care | Payer: Self-pay | Source: Ambulatory Visit | Attending: Cardiology

## 2018-01-25 ENCOUNTER — Other Ambulatory Visit: Payer: Self-pay

## 2018-01-25 ENCOUNTER — Ambulatory Visit (HOSPITAL_COMMUNITY)
Admission: RE | Admit: 2018-01-25 | Discharge: 2018-01-26 | Disposition: A | Discharge status: Home / Self Care | Payer: No Typology Code available for payment source | Source: Ambulatory Visit | Attending: Cardiology | Admitting: Cardiology

## 2018-01-25 ENCOUNTER — Encounter (HOSPITAL_COMMUNITY): Payer: Self-pay | Admitting: Cardiology

## 2018-01-25 DIAGNOSIS — Z7982 Long term (current) use of aspirin: Secondary | ICD-10-CM | POA: Insufficient documentation

## 2018-01-25 DIAGNOSIS — I200 Unstable angina: Secondary | ICD-10-CM | POA: Diagnosis present

## 2018-01-25 DIAGNOSIS — Z7902 Long term (current) use of antithrombotics/antiplatelets: Secondary | ICD-10-CM | POA: Insufficient documentation

## 2018-01-25 DIAGNOSIS — I2510 Atherosclerotic heart disease of native coronary artery without angina pectoris: Secondary | ICD-10-CM

## 2018-01-25 DIAGNOSIS — I119 Hypertensive heart disease without heart failure: Secondary | ICD-10-CM | POA: Insufficient documentation

## 2018-01-25 DIAGNOSIS — Z7984 Long term (current) use of oral hypoglycemic drugs: Secondary | ICD-10-CM | POA: Insufficient documentation

## 2018-01-25 DIAGNOSIS — E119 Type 2 diabetes mellitus without complications: Secondary | ICD-10-CM | POA: Insufficient documentation

## 2018-01-25 DIAGNOSIS — Z8673 Personal history of transient ischemic attack (TIA), and cerebral infarction without residual deficits: Secondary | ICD-10-CM | POA: Insufficient documentation

## 2018-01-25 DIAGNOSIS — I25119 Atherosclerotic heart disease of native coronary artery with unspecified angina pectoris: Secondary | ICD-10-CM | POA: Diagnosis present

## 2018-01-25 DIAGNOSIS — D649 Anemia, unspecified: Secondary | ICD-10-CM | POA: Insufficient documentation

## 2018-01-25 DIAGNOSIS — I25710 Atherosclerosis of autologous vein coronary artery bypass graft(s) with unstable angina pectoris: Secondary | ICD-10-CM | POA: Insufficient documentation

## 2018-01-25 DIAGNOSIS — E7800 Pure hypercholesterolemia, unspecified: Secondary | ICD-10-CM | POA: Insufficient documentation

## 2018-01-25 DIAGNOSIS — I6523 Occlusion and stenosis of bilateral carotid arteries: Secondary | ICD-10-CM | POA: Insufficient documentation

## 2018-01-25 DIAGNOSIS — Z951 Presence of aortocoronary bypass graft: Secondary | ICD-10-CM

## 2018-01-25 DIAGNOSIS — I10 Essential (primary) hypertension: Secondary | ICD-10-CM | POA: Diagnosis present

## 2018-01-25 DIAGNOSIS — T82855A Stenosis of coronary artery stent, initial encounter: Secondary | ICD-10-CM | POA: Insufficient documentation

## 2018-01-25 DIAGNOSIS — Y831 Surgical operation with implant of artificial internal device as the cause of abnormal reaction of the patient, or of later complication, without mention of misadventure at the time of the procedure: Secondary | ICD-10-CM | POA: Insufficient documentation

## 2018-01-25 DIAGNOSIS — Z955 Presence of coronary angioplasty implant and graft: Secondary | ICD-10-CM

## 2018-01-25 DIAGNOSIS — I2582 Chronic total occlusion of coronary artery: Secondary | ICD-10-CM | POA: Insufficient documentation

## 2018-01-25 DIAGNOSIS — Z9861 Coronary angioplasty status: Secondary | ICD-10-CM

## 2018-01-25 DIAGNOSIS — Z8249 Family history of ischemic heart disease and other diseases of the circulatory system: Secondary | ICD-10-CM | POA: Insufficient documentation

## 2018-01-25 DIAGNOSIS — Z87891 Personal history of nicotine dependence: Secondary | ICD-10-CM | POA: Insufficient documentation

## 2018-01-25 HISTORY — DX: Pneumonia, unspecified organism: J18.9

## 2018-01-25 HISTORY — PX: CORONARY STENT INTERVENTION: CATH118234

## 2018-01-25 HISTORY — DX: Migraine, unspecified, not intractable, without status migrainosus: G43.909

## 2018-01-25 HISTORY — PX: CORONARY BALLOON ANGIOPLASTY: CATH118233

## 2018-01-25 HISTORY — DX: Personal history of other diseases of the digestive system: Z87.19

## 2018-01-25 HISTORY — PX: LEFT HEART CATH AND CORS/GRAFTS ANGIOGRAPHY: CATH118250

## 2018-01-25 HISTORY — DX: Unspecified osteoarthritis, unspecified site: M19.90

## 2018-01-25 HISTORY — DX: Personal history of other medical treatment: Z92.89

## 2018-01-25 LAB — POCT ACTIVATED CLOTTING TIME
Activated Clotting Time: 323 seconds
Activated Clotting Time: 191 seconds
Activated Clotting Time: 224 seconds

## 2018-01-25 LAB — GLUCOSE, CAPILLARY
Glucose-Capillary: 138 mg/dL — ABNORMAL HIGH (ref 65–99)
Glucose-Capillary: 119 mg/dL — ABNORMAL HIGH (ref 65–99)

## 2018-01-25 SURGERY — LEFT HEART CATH AND CORS/GRAFTS ANGIOGRAPHY
Anesthesia: LOCAL

## 2018-01-25 MED ORDER — ASPIRIN 81 MG PO CHEW
81.0000 mg | CHEWABLE_TABLET | ORAL | Status: AC
  Administered 2018-01-25: 81 mg via ORAL

## 2018-01-25 MED ORDER — IOHEXOL 350 MG/ML SOLN
INTRAVENOUS | Status: DC | PRN
  Administered 2018-01-25: 130 mL via INTRA_ARTERIAL

## 2018-01-25 MED ORDER — CLOPIDOGREL BISULFATE 75 MG PO TABS
75.0000 mg | ORAL_TABLET | ORAL | Status: AC
  Administered 2018-01-25: 75 mg via ORAL

## 2018-01-25 MED ORDER — NITROGLYCERIN 0.4 MG SL SUBL
0.4000 mg | SUBLINGUAL_TABLET | SUBLINGUAL | Status: DC | PRN
Start: 2018-01-25 — End: 2018-01-26

## 2018-01-25 MED ORDER — FENTANYL CITRATE (PF) 100 MCG/2ML IJ SOLN
INTRAMUSCULAR | Status: DC | PRN
  Administered 2018-01-25 (×2): 25 ug via INTRAVENOUS

## 2018-01-25 MED ORDER — SODIUM CHLORIDE 0.9 % IV SOLN: 250.0000 mL | INTRAVENOUS | Status: DC | PRN

## 2018-01-25 MED ORDER — POLYVINYL ALCOHOL 1.4 % OP SOLN
2.0000 [drp] | Freq: Every day | OPHTHALMIC | Status: DC | PRN
  Filled 2018-01-25: qty 15

## 2018-01-25 MED ORDER — SODIUM CHLORIDE 0.9 % WEIGHT BASED INFUSION: 1.0000 mL/kg/h | INTRAVENOUS | Status: DC

## 2018-01-25 MED ORDER — LISINOPRIL 10 MG PO TABS
10.0000 mg | ORAL_TABLET | Freq: Every day | ORAL | Status: DC
  Administered 2018-01-26: 10 mg via ORAL
  Filled 2018-01-25: qty 1

## 2018-01-25 MED ORDER — FENTANYL CITRATE (PF) 100 MCG/2ML IJ SOLN
INTRAMUSCULAR | Status: AC
  Filled 2018-01-25: qty 2

## 2018-01-25 MED ORDER — VERAPAMIL HCL 2.5 MG/ML IV SOLN
INTRAVENOUS | Status: DC | PRN
  Administered 2018-01-25: 10 mL via INTRA_ARTERIAL

## 2018-01-25 MED ORDER — LABETALOL HCL 5 MG/ML IV SOLN: 10.0000 mg | INTRAVENOUS | Status: AC | PRN

## 2018-01-25 MED ORDER — HEPARIN (PORCINE) IN NACL 2-0.9 UNITS/ML
INTRAMUSCULAR | Status: AC | PRN
  Administered 2018-01-25 (×2): 500 mL

## 2018-01-25 MED ORDER — LIDOCAINE HCL (PF) 1 % IJ SOLN
INTRAMUSCULAR | Status: AC
  Filled 2018-01-25: qty 30

## 2018-01-25 MED ORDER — CLOPIDOGREL BISULFATE 75 MG PO TABS
ORAL_TABLET | ORAL | Status: AC
  Administered 2018-01-25: 75 mg via ORAL
  Filled 2018-01-25: qty 1

## 2018-01-25 MED ORDER — VERAPAMIL HCL 2.5 MG/ML IV SOLN
INTRAVENOUS | Status: AC
  Filled 2018-01-25: qty 2

## 2018-01-25 MED ORDER — CLOPIDOGREL BISULFATE 75 MG PO TABS
75.0000 mg | ORAL_TABLET | Freq: Every day | ORAL | Status: DC
  Administered 2018-01-26: 75 mg via ORAL
  Filled 2018-01-25: qty 1

## 2018-01-25 MED ORDER — SODIUM CHLORIDE 0.9% FLUSH
3.0000 mL | Freq: Two times a day (BID) | INTRAVENOUS | Status: DC
  Administered 2018-01-25: 3 mL via INTRAVENOUS

## 2018-01-25 MED ORDER — LIDOCAINE HCL (PF) 1 % IJ SOLN
INTRAMUSCULAR | Status: DC | PRN
  Administered 2018-01-25: 2 mL

## 2018-01-25 MED ORDER — ONDANSETRON HCL 4 MG/2ML IJ SOLN: 4.0000 mg | Freq: Four times a day (QID) | INTRAMUSCULAR | Status: DC | PRN

## 2018-01-25 MED ORDER — SODIUM CHLORIDE 0.9 % WEIGHT BASED INFUSION
3.0000 mL/kg/h | INTRAVENOUS | Status: DC
  Administered 2018-01-25: 3 mL/kg/h via INTRAVENOUS

## 2018-01-25 MED ORDER — HYDRALAZINE HCL 20 MG/ML IJ SOLN: 5.0000 mg | INTRAMUSCULAR | Status: AC | PRN

## 2018-01-25 MED ORDER — ISOSORBIDE MONONITRATE ER 60 MG PO TB24: 60.0000 mg | ORAL_TABLET | ORAL | Status: DC

## 2018-01-25 MED ORDER — MIDAZOLAM HCL 2 MG/2ML IJ SOLN
INTRAMUSCULAR | Status: AC
  Filled 2018-01-25: qty 2

## 2018-01-25 MED ORDER — MIDAZOLAM HCL 2 MG/2ML IJ SOLN
INTRAMUSCULAR | Status: DC | PRN
  Administered 2018-01-25 (×2): 1 mg via INTRAVENOUS

## 2018-01-25 MED ORDER — PANTOPRAZOLE SODIUM 40 MG PO TBEC
40.0000 mg | DELAYED_RELEASE_TABLET | Freq: Every day | ORAL | Status: DC
  Administered 2018-01-25: 18:00:00 40 mg via ORAL

## 2018-01-25 MED ORDER — SODIUM CHLORIDE 0.9% FLUSH: 3.0000 mL | Freq: Two times a day (BID) | INTRAVENOUS | Status: DC

## 2018-01-25 MED ORDER — SODIUM CHLORIDE 0.9% FLUSH: 3.0000 mL | INTRAVENOUS | Status: DC | PRN

## 2018-01-25 MED ORDER — HEPARIN SODIUM (PORCINE) 1000 UNIT/ML IJ SOLN
INTRAMUSCULAR | Status: AC
  Filled 2018-01-25: qty 1

## 2018-01-25 MED ORDER — IOPAMIDOL (ISOVUE-370) INJECTION 76%
INTRAVENOUS | Status: AC
  Filled 2018-01-25: qty 125

## 2018-01-25 MED ORDER — HEPARIN (PORCINE) IN NACL 1000-0.9 UT/500ML-% IV SOLN
INTRAVENOUS | Status: AC
  Filled 2018-01-25: qty 1000

## 2018-01-25 MED ORDER — METOPROLOL SUCCINATE ER 100 MG PO TB24
200.0000 mg | ORAL_TABLET | Freq: Every day | ORAL | Status: DC
  Administered 2018-01-25 – 2018-01-26 (×2): 200 mg via ORAL
  Filled 2018-01-25 (×2): qty 2

## 2018-01-25 MED ORDER — ATORVASTATIN CALCIUM 40 MG PO TABS
40.0000 mg | ORAL_TABLET | Freq: Every evening | ORAL | Status: DC
  Administered 2018-01-25: 40 mg via ORAL
  Filled 2018-01-25: qty 1

## 2018-01-25 MED ORDER — ASPIRIN EC 81 MG PO TBEC
81.0000 mg | DELAYED_RELEASE_TABLET | Freq: Every day | ORAL | Status: DC
  Administered 2018-01-26: 81 mg via ORAL
  Filled 2018-01-25: qty 1

## 2018-01-25 MED ORDER — ASPIRIN 81 MG PO CHEW
CHEWABLE_TABLET | ORAL | Status: AC
  Administered 2018-01-25: 81 mg via ORAL
  Filled 2018-01-25: qty 1

## 2018-01-25 MED ORDER — PANTOPRAZOLE SODIUM 40 MG PO TBEC
40.0000 mg | DELAYED_RELEASE_TABLET | Freq: Every day | ORAL | Status: DC
  Administered 2018-01-25 – 2018-01-26 (×2): 40 mg via ORAL
  Filled 2018-01-25 (×3): qty 1

## 2018-01-25 MED ORDER — IOPAMIDOL (ISOVUE-370) INJECTION 76%
INTRAVENOUS | Status: DC | PRN
  Administered 2018-01-25: 115 mL via INTRA_ARTERIAL

## 2018-01-25 MED ORDER — RANOLAZINE ER 500 MG PO TB12
1000.0000 mg | ORAL_TABLET | Freq: Two times a day (BID) | ORAL | Status: DC
  Administered 2018-01-25 – 2018-01-26 (×3): 1000 mg via ORAL
  Filled 2018-01-25 (×3): qty 2

## 2018-01-25 MED ORDER — ANGIOPLASTY BOOK
Freq: Once | Status: AC
  Administered 2018-01-25: 22:00:00
  Filled 2018-01-25: qty 1

## 2018-01-25 MED ORDER — HEPARIN SODIUM (PORCINE) 1000 UNIT/ML IJ SOLN
INTRAMUSCULAR | Status: DC | PRN
  Administered 2018-01-25: 5000 [IU] via INTRAVENOUS
  Administered 2018-01-25: 3000 [IU] via INTRAVENOUS
  Administered 2018-01-25: 5000 [IU] via INTRAVENOUS

## 2018-01-25 MED ORDER — LINAGLIPTIN 5 MG PO TABS
5.0000 mg | ORAL_TABLET | Freq: Every day | ORAL | Status: DC
Start: 2018-01-25 — End: 2018-01-26
  Administered 2018-01-25 – 2018-01-26 (×2): 5 mg via ORAL
  Filled 2018-01-25 (×2): qty 1

## 2018-01-25 MED ORDER — FERROUS SULFATE 325 (65 FE) MG PO TABS
325.0000 mg | ORAL_TABLET | ORAL | Status: DC
  Administered 2018-01-25 – 2018-01-26 (×2): 325 mg via ORAL
  Filled 2018-01-25 (×2): qty 1

## 2018-01-25 MED ORDER — SODIUM CHLORIDE 0.9 % IV SOLN
INTRAVENOUS | Status: AC
  Administered 2018-01-25: 14:00:00 via INTRAVENOUS

## 2018-01-25 MED ORDER — ACETAMINOPHEN 500 MG PO TABS: 1000.0000 mg | ORAL_TABLET | Freq: Four times a day (QID) | ORAL | Status: DC | PRN

## 2018-01-25 MED ORDER — METFORMIN HCL 500 MG PO TABS: 1000.0000 mg | ORAL_TABLET | Freq: Two times a day (BID) | ORAL | Status: DC

## 2018-01-25 SURGICAL SUPPLY — 24 items
BALLN EMERGE MR PUSH 1.20X12 (BALLOONS) ×2
BALLN EMERGE MR PUSH 1.5X12 (BALLOONS) ×2
BALLN SAPPHIRE 2.0X12 (BALLOONS) ×2
BALLN SAPPHIRE ~~LOC~~ 3.0X8 (BALLOONS) ×1 IMPLANT
BALLOON EMERGE MR PUSH 1.20X12 (BALLOONS) IMPLANT
BALLOON EMERGE MR PUSH 1.5X12 (BALLOONS) IMPLANT
BALLOON SAPPHIRE 2.0X12 (BALLOONS) IMPLANT
BAND CMPR LRG ZPHR (HEMOSTASIS) ×1
BAND ZEPHYR COMPRESS 30 LONG (HEMOSTASIS) ×1 IMPLANT
CATH INFINITI 5FR MULTPACK ANG (CATHETERS) ×1 IMPLANT
CATH VISTA GUIDE 6FR JL3.5 (CATHETERS) ×1 IMPLANT
CATH VISTA GUIDE 6FR XB3.5 (CATHETERS) ×1 IMPLANT
GLIDESHEATH SLEND A-KIT 6F 22G (SHEATH) ×1 IMPLANT
GUIDEWIRE INQWIRE 1.5J.035X260 (WIRE) IMPLANT
INQWIRE 1.5J .035X260CM (WIRE) ×2
KIT ENCORE 26 ADVANTAGE (KITS) ×1 IMPLANT
KIT HEART LEFT (KITS) ×2 IMPLANT
PACK CARDIAC CATHETERIZATION (CUSTOM PROCEDURE TRAY) ×2 IMPLANT
SHEATH RAIN RADIAL 21G 6FR (SHEATH) IMPLANT
STENT SIERRA 3.00 X 15 MM (Permanent Stent) ×1 IMPLANT
TRANSDUCER W/STOPCOCK (MISCELLANEOUS) ×2 IMPLANT
TUBING CIL FLEX 10 FLL-RA (TUBING) ×2 IMPLANT
WIRE ASAHI PROWATER 180CM (WIRE) ×1 IMPLANT
WIRE PT2 MS 185 (WIRE) ×1 IMPLANT

## 2018-01-25 NOTE — Interval H&P Note (Signed)
History and Physical Interval Note:  01/25/2018 8:01 AM  [name]  has presented today for surgery, with the diagnosis of progressive angina.  Jason Velasquez is had significant class III-IV angina over the last couple weeks.  Most notably on Sunday night.  I have personally reviewed his cardiac cath images from his most recent catheterizations.  The most significant change from post PCI images in August to the November images versus the proximal LAD stenosis just proximal to the aneurysmal dilation.  I reviewed these images with Dr. Tamala Julian, and we felt like this could be the potential culprit area for his symptoms.  We would have to cross through the left main-circumflex stent.    The various methods of treatment have been discussed with the patient and family. After consideration of risks, benefits and other options for treatment, the patient has consented to  Procedure(s): LEFT HEART CATH AND CORS/GRAFTS ANGIOGRAPHY (N/A) as a surgical intervention .  The patient's history has been reviewed, patient examined, no change in status, stable for surgery.  I have reviewed the patient's chart and labs.  Questions were answered to the patient's satisfaction.     [name]

## 2018-01-25 NOTE — Progress Notes (Signed)
TR BAND REMOVAL  LOCATION:  left radial  DEFLATED PER PROTOCOL:  Yes.    TIME BAND OFF / DRESSING APPLIED:   1730   SITE UPON ARRIVAL:   Level 0  SITE AFTER BAND REMOVAL:  Level 0  CIRCULATION SENSATION AND MOVEMENT:  Within Normal Limits  Yes.    COMMENTS:

## 2018-01-26 ENCOUNTER — Other Ambulatory Visit: Payer: Self-pay

## 2018-01-26 DIAGNOSIS — Z951 Presence of aortocoronary bypass graft: Secondary | ICD-10-CM

## 2018-01-26 DIAGNOSIS — Z955 Presence of coronary angioplasty implant and graft: Secondary | ICD-10-CM

## 2018-01-26 DIAGNOSIS — I200 Unstable angina: Secondary | ICD-10-CM

## 2018-01-26 DIAGNOSIS — I10 Essential (primary) hypertension: Secondary | ICD-10-CM

## 2018-01-26 LAB — CBC
HCT: 31.7 % — ABNORMAL LOW (ref 39.0–52.0)
HEMOGLOBIN: 9.5 g/dL — AB (ref 13.0–17.0)
MCH: 22.9 pg — AB (ref 26.0–34.0)
MCHC: 30 g/dL (ref 30.0–36.0)
MCV: 76.6 fL — ABNORMAL LOW (ref 78.0–100.0)
PLATELETS: 243 10*3/uL (ref 150–400)
RBC: 4.14 MIL/uL — ABNORMAL LOW (ref 4.22–5.81)
RDW: 18.2 % — AB (ref 11.5–15.5)
WBC: 5 10*3/uL (ref 4.0–10.5)

## 2018-01-26 LAB — BASIC METABOLIC PANEL
Anion gap: 7 (ref 5–15)
BUN: 8 mg/dL (ref 6–20)
CO2: 26 mmol/L (ref 22–32)
CREATININE: 1.13 mg/dL (ref 0.61–1.24)
Calcium: 8.8 mg/dL — ABNORMAL LOW (ref 8.9–10.3)
Chloride: 105 mmol/L (ref 101–111)
GFR calc Af Amer: >60 mL/min (ref >60)
GFR calc non Af Amer: >60 mL/min (ref >60)
GLUCOSE: 132 mg/dL — AB (ref 65–99)
Potassium: 4 mmol/L (ref 3.5–5.1)
Sodium: 138 mmol/L (ref 135–145)

## 2018-01-26 LAB — GLUCOSE, CAPILLARY
Glucose-Capillary: 161 mg/dL — ABNORMAL HIGH (ref 65–99)
Glucose-Capillary: 179 mg/dL — ABNORMAL HIGH (ref 65–99)

## 2018-01-26 MED ORDER — NITROGLYCERIN 0.4 MG SL SUBL: 0.4000 mg | SUBLINGUAL_TABLET | SUBLINGUAL | 2 refills | Status: DC | PRN

## 2018-01-26 MED ORDER — METFORMIN HCL 1000 MG PO TABS: 1000.0000 mg | ORAL_TABLET | Freq: Two times a day (BID) | ORAL | 11 refills | Status: DC

## 2018-01-26 NOTE — Discharge Summary (Signed)
Discharge Summary    Patient ID: Jason Velasquez,  MRN: [id number], DOB/AGE: 03/15/1956 62 y.o.  Admit date: 01/25/2018 Discharge date: 01/26/2018  Primary Care Provider: [name] Primary Cardiologist: Dr. Oval Linsey   Discharge Diagnoses    Principal Problem:   Progressive angina [facility]) Active Problems:   Essential hypertension   S/P CABG (coronary artery bypass graft)   Coronary artery disease involving native coronary artery of native heart with angina pectoris [facility])   CAD S/P percutaneous coronary angioplasty: Complex PCI with DES 8/22/218   Status post coronary artery stent placement   Allergies No Known Allergies  Diagnostic Studies/Procedures   Procedures   CORONARY BALLOON ANGIOPLASTY  CORONARY STENT INTERVENTION  LEFT HEART CATH AND CORS/GRAFTS ANGIOGRAPHY 01/25/18  Conclusion     CULPRIT: Ost LAD to Prox LAD lesion is 99% stenosed.  A drug-eluting stent was successfully placed using a STENT SIERRA 3.00 X 15 MM. Post-dilated to 3.3 mm  Post intervention, there is a 0% residual stenosis.  Prox LAD to Mid LAD lesion is 100% stenosed after several septal perforators & diagonal branches.  Ost Cx to Prox Cx lesion is 55% stenosed after LAD stent --  PTCA only -- Balloon angioplasty was performed using a BALLOON EMERGE MR PUSH 1.20X12.  Post intervention, there is a 35% residual stenosis.  Non-stenotic Ost LM to Dist LM lesion previously treated with DES - patent.  Ost 1st Mrg lesion is 80% stenosed. - Unable to cross lesion with balloon.  Prox Cx to Mid Cx lesion previously treated with DES - patent after ostial ISR.  Mid RCA lesion is 100% stenosed.  Ost RCA to Prox RCA lesion is 70% stenosed.  Known occluson of SVG-RPDA & SVG-OM  LIMA and is normal in caliber. Prox Graft lesion is 20% stenosed.  The graft exhibits minimal luminal irregularities.  LV end diastolic pressure is moderately elevated.  There is no aortic valve stenosis.  Post  intervention, there is a 0% residual stenosis.  Successful DES stent in the proximal and ostial LAD. Unfortunately the stent did shift into the left main creating a somewhat culotte technique crossing the circumflex. I was able to wire the circumflex and balloon the stent struts with a 1.2 mm balloon. However guide support did not allow larger balloon to cross.  Unable to cross into the OM1 lesion due to tortuosity and multiple stent struts.    Coronary Diagrams   Diagnostic Diagram       Post-Intervention Diagram           History of Present Illness     Jason Velasquez is a 62 y.o. male with CAD s/p CABG ( LIMA-->LAD, SVG-->PDA, SVG-->OM1), multiple PCIs , chronic stable angina, PAD, hypertension, diabetes and CVA.  In August 2018, he had extensive PCI from the left main down into the circumflex and OM1. PTCA of the ostial LAD. Repeat cath November 2018, for chest pain in the setting of anemia, revealed progression of disease distal to the PTCA site of the LAD just proximal to an aneurysmal dilation segment. He did not undergo any intervention at that time based on his anemia.  Since that time, he has developed progressively worsening angina. He was seen by Dr. Oval Linsey in clinic and set up for elective LHC for re-evaluation of the LAD lesion and possible PCI.   Hospital Course     Pt presented to [facility] on 01/25/18 for planned cardiac catheterization. Procedure was performed by Dr. Ellyn Hack. Access obtained via  the left radial artery. The ostial to proximal LAD was 99% stenosed. This was treated with PCI + DES. Unfortunately the stent did shift into the left main creating a somewhat culotte technique crossing the circumflex.  Dr. Ellyn Hack was able to wire the circumflex and balloon the stent struts with a 1.2 mm balloon. Additional angiographic details are outlined above. No other intervention was indicated. He tolerated the procedure well and left the cath lab in stable condition. He was  continued on DAPT w/ ASA and Plavix, BB, high intensity statin, LA nitrate, Ranexa and ACE-I. He had no post cath complications. His radial access site remained stable. Vital signs and renal function stable. He had no recurrent CP and no dyspnea. He ambulated with cardiac rehab w/o difficulty. He was seen and examined by Dr. Claiborne Billings who felt that he was stable for discharge home. Post hospital f/u will be arranged in 1-2 weeks.   Consultants: none   Discharge Vitals Blood pressure (!) 162/85, pulse 70, temperature 98.4 F (36.9 C), temperature source Oral, resp. rate 14, height 5' 10.5" (1.791 m), weight 211 lb 10.3 oz (96 kg), SpO2 98 %.  Filed Weights   01/25/18 0645 01/26/18 0427  Weight: 210 lb (95.3 kg) 211 lb 10.3 oz (96 kg)    Labs & Radiologic Studies    CBC Recent Labs    01/26/18 0356  WBC 5.0  HGB 9.5*  HCT 31.7*  MCV 76.6*  PLT 454   Basic Metabolic Panel Recent Labs    01/26/18 0356  NA 138  K 4.0  CL 105  CO2 26  GLUCOSE 132*  BUN 8  CREATININE 1.13  CALCIUM 8.8*   Liver Function Tests No results for input(s): AST, ALT, ALKPHOS, BILITOT, PROT, ALBUMIN in the last 72 hours. No results for input(s): LIPASE, AMYLASE in the last 72 hours. Cardiac Enzymes No results for input(s): CKTOTAL, CKMB, CKMBINDEX, TROPONINI in the last 72 hours. BNP Invalid input(s): POCBNP D-Dimer No results for input(s): DDIMER in the last 72 hours. Hemoglobin A1C No results for input(s): HGBA1C in the last 72 hours. Fasting Lipid Panel No results for input(s): CHOL, HDL, LDLCALC, TRIG, CHOLHDL, LDLDIRECT in the last 72 hours. Thyroid Function Tests No results for input(s): TSH, T4TOTAL, T3FREE, THYROIDAB in the last 72 hours.  Invalid input(s): FREET3 _____________  No results found. Disposition   Pt is being discharged home today in good condition.  Follow-up Plans & Appointments    Follow-up Information    Skeet Latch, MD Follow up.   Specialty:   Cardiology Why:  our office will call you with a follow up appointment with Dr. Oval Linsey or her PA/NP Contact information: [address] [place] [address] [phone]          Discharge Instructions    Amb Referral to Cardiac Rehabilitation   Complete by:  As directed    Diagnosis:   Coronary Stents PTCA     Diet - low sodium heart healthy   Complete by:  As directed    Increase activity slowly   Complete by:  As directed       Discharge Medications   Allergies as of 01/26/2018   No Known Allergies     Medication List    STOP taking these medications   pantoprazole 40 MG tablet Commonly known as:  PROTONIX     TAKE these medications   acetaminophen 500 MG tablet Commonly known as:  TYLENOL Take 1,000 mg by mouth every  6 (six) hours as needed for moderate pain or headache.   ARTIFICIAL TEARS OP Place 1 drop into both eyes daily as needed (for dry eyes).   aspirin EC 81 MG tablet Take 81 mg by mouth daily.   atorvastatin 40 MG tablet Commonly known as:  LIPITOR Take 1 tablet (40 mg total) by mouth every evening.   clopidogrel 75 MG tablet Commonly known as:  PLAVIX Take 1 tablet (75 mg total) by mouth daily with breakfast.   ferrous sulfate 325 (65 FE) MG tablet Take 1 tablet (325 mg total) by mouth every other day.   isosorbide mononitrate 120 MG 24 hr tablet Commonly known as:  IMDUR 1/2 TABLET BY MOUTH IN THE MORNING AND 1 IN THE EVENING What changed:    how much to take  how to take this  when to take this  additional instructions   Lancets Misc 1 Device by Does not apply route 4 (four) times daily -  before meals and at bedtime.   lisinopril 10 MG tablet Commonly known as:  PRINIVIL,ZESTRIL TAKE 1 TABLET BY MOUTH DAILY. What changed:    how much to take  how to take this  when to take this   metFORMIN 1000 MG tablet Commonly known as:  GLUCOPHAGE Take 1 tablet (1,000 mg total) by mouth 2 (two) times daily with  a meal. Start taking on:  01/28/2018 What changed:  These instructions start on 01/28/2018. If you are unsure what to do until then, ask your doctor or other care provider.   metoprolol 200 MG 24 hr tablet Commonly known as:  TOPROL-XL TAKE 1 TABLET BY MOUTH DAILY. TAKE WITH OR IMMEDIATELY FOLLOWING A MEAL. What changed:  See the new instructions.   nitroGLYCERIN 0.4 MG SL tablet Commonly known as:  NITROSTAT Place 1 tablet (0.4 mg total) under the tongue every 5 (five) minutes as needed for chest pain.   omeprazole 20 MG capsule Commonly known as:  PRILOSEC Take 40 mg by mouth at bedtime.   RANEXA 1000 MG SR tablet Generic drug:  ranolazine Take 1,000 mg by mouth 2 (two) times daily.   sitaGLIPtin 100 MG tablet Commonly known as:  JANUVIA Take 1 tablet (100 mg total) by mouth daily.        Aspirin prescribed at discharge?  Yes High Intensity Statin Prescribed? (Lipitor 40-80mg  or Crestor 20-40mg ): Yes Beta Blocker Prescribed? Yes For EF <40%, was ACEI/ARB Prescribed? Yes ADP Receptor Inhibitor Prescribed? (i.e. Plavix etc.-Includes Medically Managed Patients): Yes For EF <40%, Aldosterone Inhibitor Prescribed? No:  Was EF assessed during THIS hospitalization? No:  Was Cardiac Rehab II ordered? (Included Medically managed Patients): Yes   Outstanding Labs/Studies   None   Duration of Discharge Encounter   Greater than 30 minutes including physician time.  Signed,  Lyda Jester, PA-C 01/26/2018, 2:49 PM

## 2018-01-26 NOTE — Progress Notes (Signed)
CARDIAC REHAB PHASE I   PRE:  Rate/Rhythm: 73 SR    BP: sitting 155/79    SaO2:   MODE:  Ambulation: 500 ft   POST:  Rate/Rhythm: 92 SR    BP: sitting 173/85     SaO2:   Tolerated well, no c/o angina. He is very accustomed to angina and taking NTG frequently. BP elevated. Ed completed with good understanding. Will refer to [facility] [place]. He is about to get Medicare so hopefully he can have coverage. Understands importance of Plavix/ASA.  [place], ACSM 01/26/2018 9:54 AM

## 2018-01-26 NOTE — Progress Notes (Addendum)
Progress Note  Patient Name: Jason Velasquez Date of Encounter: 01/26/2018  Primary Cardiologist: Dr. Oval Linsey   Subjective   Feels ok but has a bit of chest soreness. Feels different from usual angina. He has not yet ambulated with rehab. No dyspnea. Left radial access site is stable.   Inpatient Medications    Scheduled Meds: . aspirin EC  81 mg Oral Daily  . atorvastatin  40 mg Oral QPM  . clopidogrel  75 mg Oral Q breakfast  . ferrous sulfate  325 mg Oral QODAY  . isosorbide mononitrate  60-120 mg Oral See admin instructions  . linagliptin  5 mg Oral Daily  . lisinopril  10 mg Oral Daily  . metFORMIN  1,000 mg Oral BID WC  . metoprolol  200 mg Oral Daily  . pantoprazole  40 mg Oral Daily  . pantoprazole  40 mg Oral Daily  . ranolazine  1,000 mg Oral BID  . sodium chloride flush  3 mL Intravenous Q12H   Continuous Infusions: . sodium chloride     PRN Meds: sodium chloride, acetaminophen, nitroGLYCERIN, ondansetron (ZOFRAN) IV, polyvinyl alcohol, sodium chloride flush   Vital Signs    Vitals:   01/25/18 2000 01/26/18 0427 01/26/18 0807 01/26/18 0810  BP:  (!) 143/82  (!) 190/102  Pulse:  72 80   Resp: 19 20 18    Temp:  98.2 F (36.8 C) 98 F (36.7 C)   TempSrc:  Oral Oral   SpO2:  98% 99%   Weight:  211 lb 10.3 oz (96 kg)    Height:        Intake/Output Summary (Last 24 hours) at 01/26/2018 0851 Last data filed at 01/26/2018 0809 Gross per 24 hour  Intake 1340 ml  Output 600 ml  Net 740 ml   Filed Weights   01/25/18 0645 01/26/18 0427  Weight: 210 lb (95.3 kg) 211 lb 10.3 oz (96 kg)    Telemetry    NSR - Personally Reviewed  ECG    NSR - Personally Reviewed  Physical Exam   GEN: No acute distress.   Neck: No JVD Cardiac: RRR, no murmurs, rubs, or gallops.  Respiratory: Clear to auscultation bilaterally. GI: Soft, nontender, non-distended  MS: No edema; No deformity. Neuro:  Nonfocal  Psych: Normal affect   Labs    Chemistry Recent  Labs  Lab 01/19/18 1033 01/26/18 0356  NA 140 138  K 4.6 4.0  CL 104 105  CO2 22 26  GLUCOSE 161* 132*  BUN 13 8  CREATININE 1.12 1.13  CALCIUM 9.1 8.8*  GFRNONAA 70 >60  GFRAA 81 >60  ANIONGAP  --  7     Hematology Recent Labs  Lab 01/19/18 1033 01/26/18 0356  WBC 5.6 5.0  RBC 4.22 4.14*  HGB 9.7* 9.5*  HCT 30.8* 31.7*  MCV 73* 76.6*  MCH 23.0* 22.9*  MCHC 31.5 30.0  RDW 17.8* 18.2*  PLT 245 243    Cardiac EnzymesNo results for input(s): TROPONINI in the last 168 hours. No results for input(s): TROPIPOC in the last 168 hours.   BNPNo results for input(s): BNP, PROBNP in the last 168 hours.   DDimer No results for input(s): DDIMER in the last 168 hours.   Radiology    No results found.  Cardiac Studies   Procedures   CORONARY BALLOON ANGIOPLASTY  CORONARY STENT INTERVENTION  LEFT HEART CATH AND CORS/GRAFTS ANGIOGRAPHY 01/25/18  Conclusion     CULPRIT: Ost LAD to Prox LAD  lesion is 99% stenosed.  A drug-eluting stent was successfully placed using a STENT SIERRA 3.00 X 15 MM. Post-dilated to 3.3 mm  Post intervention, there is a 0% residual stenosis.  Prox LAD to Mid LAD lesion is 100% stenosed after several septal perforators & diagonal branches.  Ost Cx to Prox Cx lesion is 55% stenosed after LAD stent --  PTCA only -- Balloon angioplasty was performed using a BALLOON EMERGE MR PUSH 1.20X12.  Post intervention, there is a 35% residual stenosis.  Non-stenotic Ost LM to Dist LM lesion previously treated with DES - patent.  Ost 1st Mrg lesion is 80% stenosed. - Unable to cross lesion with balloon.  Prox Cx to Mid Cx lesion previously treated with DES - patent after ostial ISR.  Mid RCA lesion is 100% stenosed.  Ost RCA to Prox RCA lesion is 70% stenosed.  Known occluson of SVG-RPDA & SVG-OM  LIMA and is normal in caliber. Prox Graft lesion is 20% stenosed.  The graft exhibits minimal luminal irregularities.  LV end diastolic pressure is  moderately elevated.  There is no aortic valve stenosis.  Post intervention, there is a 0% residual stenosis.   Successful DES stent in the proximal and ostial LAD.  Unfortunately the stent did shift into the left main creating a somewhat culotte technique crossing the circumflex.  I was able to wire the circumflex and balloon the stent struts with a 1.2 mm balloon.  However guide support did not allow larger balloon to cross.  Unable to cross into the OM1 lesion due to tortuosity and multiple stent struts.     Patient Profile     Jason Velasquez is a 62 y.o. male with CAD s/p CABG ( LIMA-->LAD, SVG-->PDA, SVG-->OM1), multiple PCIs , chronic stable angina, PAD, hypertension, diabetes and CVA.  In August 2018, he had extensive PCI from the left main down into the circumflex and OM1. PTCA of the ostial LAD. Repeat cath November 2018, for chest pain in the setting of anemia, revealed progression of disease distal to the PTCA site of the LAD just proximal to an aneurysmal dilation segment. He did not undergo any intervention at that time based on his anemia.  Since that time, he has developed progressively worsening angina. He was seen by Dr. Oval Linsey in clinic and set up for elective LHC for evaluation of the LAD lesion and possible PCI.   Assessment & Plan   1. CAD: H/o CABG with multiple PCIs since then. Repeat cath done 4/24 for recurrent unstable angina. Complete angiographic details are outlined in detail above.  A drug-eluting stent was successfully placed using a STENT SIERRA 3.00 X 15 MM. Post-dilated to 3.3 mm to the prox to mid LAD. No LV gram was performed. He has mild chest soreness today but notes that it is different from his usual angina. No dyspnea. We will have him ambulate with cardiac rehab this morning to ensure no exertional symptoms. Continue on DAPT w/ ASA and Plavix. Also on high intensity statin therapy w/ Lipitor 40 mg, BB therapy- metoprolol 200 mg, LA nitrate, Ranexa and  ACE-I.   2. DM: metformin ordered. I will hold this for another day given LHC was done just yesterday.   3. HTN:  151/85 this morning. He is scheduled to get metoprolol 200 mg, lisinopril 10 mg and Imdur 60 mg this morning. He may need further increase in lisinopril to 20 mg for better BP control.   4. Lipids: Last lipid panel showed controlled  LDL at 36 mg/dL. Continue Lipitor 40 mg.   Dispo: possible d/c home today if he does ok with cardiac rehab.   For questions or updates, please contact [employer] Please consult [email or web address] for contact info under Cardiology/STEMI.      Signed, Lyda Jester, PA-C  01/26/2018, 8:51 AM     Patient seen and examined. Agree with assessment and plan. No recurrent angina.  Felt great with walking; markedly improved from Sunday where he required NTG with minimal activity.  BP elevated earlier, But had not received am meds.  May benefit from addition of amlodipine.  Microcytic anemia consider outpatient W/U if not already done.   OK to dc today.  Troy Sine, MD, [facility] 01/26/2018 10:19 AM

## 2018-01-27 MED FILL — Heparin Sod (Porcine)-NaCl IV Soln 1000 Unit/500ML-0.9%: INTRAVENOUS | Qty: 1000 | Status: AC

## 2018-01-30 ENCOUNTER — Ambulatory Visit: Payer: No Typology Code available for payment source

## 2018-01-30 MED FILL — METOPROLOL SUCC ER 200 MG T: 200 | 30 days supply | Qty: 30 | Fill #2

## 2018-02-01 NOTE — Progress Notes (Signed)
Cardiology Office Note   Date:  02/06/2018   ID:  Alioune Hodgkin, DOB 1956-08-10, MRN [id number]  PCP:  [name], DO  Cardiologist: Dr. Oval Linsey Chief Complaint  Patient presents with  . Follow-up  . Chest Pain    Night before last.  . Headache  . Shortness of Breath     History of Present Illness: Jason Velasquez is a 62 y.o. male who presents for ongoing assessment and management of coronary artery disease, with history of CABG (LIMA to LAD, SVG to PDA, SVG to OM1), multiple PCI and stents, who was admitted with progressively worsening angina requiring cardiac catheterization.  Cardiac catheterization on 01/25/2018 revealed a 90% LAD proximal stenosis, treated with PCI and drug-eluting stent, however the stent shifted into the left main creating a: culotte  crossing the circumflex.  As a result of this, Dr. Ellyn Hack was able to wire the circumflex and balloon stent struts with a 1.2 mm balloon.  No other intervention was required.  The patient was to continue on dual antiplatelet therapy with aspirin and Plavix, continue beta-blocker,high intensity statin, nitrates, Ranexa, and ACE inhibitors..  Other history includes CVA, hypertensive heart disease, hypercholesterolemia, diabetes, carotid artery disease.   He is here today feeling well. He has had some chest soreness and tylenol has been helpful. He is interested in cardiac rehab. He denies new symptoms.   Past Medical History:  Diagnosis Date  . Angina, class III ([place]) 08/24/2015  . Arthritis    "hands" (01/25/2018)  . AVM (arteriovenous malformation) 08/15/2017  . CAD (coronary artery disease) of artery bypass graft    CABG- 2007   . Carotid stenosis 03/03/2016   52-84% RICA, 1-32% LICA, >44% RECA stenosis Repeat 12/2016.  . Claudication ([place])   . Controlled type 2 diabetes mellitus without complication ([place]) 0/10/270   Dx March of 2016. GAD65 negative Anti Islet cell negative    . DKA (diabetic ketoacidoses) ([place]) 12/07/2014  .  Dyspnea   . History of blood transfusion ~ 08/2017   "related to LGIB"  . History of hiatal hernia   . Hx of adenomatous colonic polyps 05/27/2015  . Hypercholesterolemia   . Hypertension   . Hypertensive heart disease 03/03/2016  . Migraine    "only in my 30's" (01/25/2018)  . Pneumonia ~ 1990  . Stroke [facility]) 07/12/2014   "I didn't realize I'd had one; hospital found it"; denies residual on 01/25/2018  . Tubular adenoma of colon 05/31/2015   Colonoscopy 05/20/15 with [name]: 3 tubullar adenoma's largest 2.3cm.  Plan for repeat Colonoscopy in 2019.    Past Surgical History:  Procedure Laterality Date  . CARDIAC CATHETERIZATION N/A 08/26/2015   Procedure: Left Heart Cath and Cors/Grafts Angiography;  Surgeon: [name], MD;  Location: [place] [facility];  Service: Cardiovascular;  Laterality: N/A;  . CORONARY ARTERY BYPASS GRAFT  2007   "CABG X3", at [facility]   . CORONARY BALLOON ANGIOPLASTY N/A 05/25/2017   Procedure: CORONARY BALLOON ANGIOPLASTY;  Surgeon: [name], MD;  Location: [place] [facility];  Service: Cardiovascular;  Laterality: N/A;  . CORONARY BALLOON ANGIOPLASTY N/A 01/25/2018   Procedure: CORONARY BALLOON ANGIOPLASTY;  Surgeon: [name], MD;  Location: [place] [facility];  Service: Cardiovascular;  Laterality: N/A;  . CORONARY STENT INTERVENTION N/A 05/25/2017   Procedure: CORONARY STENT INTERVENTION;  Surgeon: [name], MD;  Location: [place] [facility];  Service: Cardiovascular;  Laterality: N/A;  . CORONARY STENT INTERVENTION  N/A 01/25/2018   Procedure: CORONARY STENT INTERVENTION;  Surgeon: [name], MD;  Location: [place] [facility];  Service: Cardiovascular;  Laterality: N/A;  . ESOPHAGOGASTRODUODENOSCOPY N/A 08/12/2017   Procedure: ESOPHAGOGASTRODUODENOSCOPY (EGD);  Surgeon: Jerene Bears, MD;  [facility]: [facility]COPY;  Service: Gastroenterology;  Laterality: N/A;  . LEFT HEART CATH AND CORS/GRAFTS ANGIOGRAPHY N/A  05/23/2017   Procedure: LEFT HEART CATH AND CORS/GRAFTS ANGIOGRAPHY;  Surgeon: [name], MD;  Location: [place] [facility];  Service: Cardiovascular;  Laterality: N/A;  . LEFT HEART CATH AND CORS/GRAFTS ANGIOGRAPHY N/A 08/15/2017   Procedure: LEFT HEART CATH AND CORS/GRAFTS ANGIOGRAPHY;  Surgeon: Burnell Blanks, MD;  Location: [place] [facility];  Service: Cardiovascular;  Laterality: N/A;  . LEFT HEART CATH AND CORS/GRAFTS ANGIOGRAPHY N/A 01/25/2018   Procedure: LEFT HEART CATH AND CORS/GRAFTS ANGIOGRAPHY;  Surgeon: [name], MD;  Location: [place] [facility];  Service: Cardiovascular;  Laterality: N/A;     Current Outpatient Medications  Medication Sig Dispense Refill  . acetaminophen (TYLENOL) 500 MG tablet Take 1,000 mg by mouth every 6 (six) hours as needed for moderate pain or headache.    Marland Kitchen aspirin EC 81 MG tablet Take 81 mg by mouth daily.    Marland Kitchen atorvastatin (LIPITOR) 40 MG tablet Take 1 tablet (40 mg total) by mouth every evening. 30 tablet 11  . clopidogrel (PLAVIX) 75 MG tablet Take 1 tablet (75 mg total) by mouth daily with breakfast. 30 tablet 11  . ferrous sulfate 325 (65 FE) MG tablet Take 1 tablet (325 mg total) by mouth every other day. 100 tablet 0  . Hypromellose (ARTIFICIAL TEARS OP) Place 1 drop into both eyes daily as needed (for dry eyes).     . isosorbide mononitrate (IMDUR) 120 MG 24 hr tablet 1/2 TABLET BY MOUTH IN THE MORNING AND 1 IN THE EVENING (Patient taking differently: Take 60-120 mg by mouth See admin instructions. Take 60 mg by mouth in the morning and take 120 mg by mouth in the evening) 45 tablet 5  . Lancets MISC 1 Device by Does not apply route 4 (four) times daily -  before meals and at bedtime. 100 each 11  . lisinopril (PRINIVIL,ZESTRIL) 10 MG tablet TAKE 1 TABLET BY MOUTH DAILY. (Patient taking differently: TAKE 10 MG BY MOUTH DAILY.) 90 tablet 3  . metFORMIN (GLUCOPHAGE) 1000 MG tablet Take 1 tablet (1,000 mg total) by mouth 2  (two) times daily with a meal. 60 tablet 11  . metoprolol (TOPROL-XL) 200 MG 24 hr tablet TAKE 1 TABLET BY MOUTH DAILY. TAKE WITH OR IMMEDIATELY FOLLOWING A MEAL. (Patient taking differently: TAKE 200 MG BY MOUTH DAILY. TAKE WITH OR IMMEDIATELY FOLLOWING A MEAL.) 30 tablet 11  . nitroGLYCERIN (NITROSTAT) 0.4 MG SL tablet Place 1 tablet (0.4 mg total) under the tongue every 5 (five) minutes as needed for chest pain. 25 tablet 2  . omeprazole (PRILOSEC) 20 MG capsule Take 40 mg by mouth at bedtime.     . ranolazine (RANEXA) 1000 MG SR tablet Take 1,000 mg by mouth 2 (two) times daily.    . sitaGLIPtin (JANUVIA) 100 MG tablet Take 1 tablet (100 mg total) by mouth daily. 90 tablet 3   No current facility-administered medications for this visit.     Allergies:   Patient has no known allergies.    Social History:  The patient  reports that he quit smoking about 8 years ago. His smoking use included cigarettes. He  has a 76.00 pack-year smoking history. He has never used smokeless tobacco. He reports that he drinks alcohol. He reports that he does not use drugs.   Family History:  The patient's family history includes Diabetes in his father; Hypertension in his mother and sister.    ROS: All other systems are reviewed and negative. Unless otherwise mentioned in H&P    PHYSICAL EXAM: VS:  BP 138/80 (BP Location: Left Arm, Patient Position: Sitting, Cuff Size: Large)   Pulse 80   Ht 5' 10.5" (1.791 m)   Wt 216 lb (98 kg)   BMI 30.55 kg/m  , BMI Body mass index is 30.55 kg/m. GEN: Well nourished, well developed, in no acute distress  HEENT: normal  Neck: no JVD, carotid bruits, or masses Cardiac: RRR; distant heart sounds, no murmurs, rubs, or gallops,no edema  Respiratory:  Clear to auscultation bilaterally, normal work of breathing GI: soft, nontender, nondistended, + BS MS: no deformity or atrophy  Skin: warm and dry, no rash Neuro:  Strength and sensation are intact Psych: euthymic  mood, full affect   EKG: NSR rate of 80 bpm. Non-specific ST abnormality.   Recent Labs: 01/26/2018: BUN 8; Creatinine, Ser 1.13; Hemoglobin 9.5; Platelets 243; Potassium 4.0; Sodium 138    Lipid Panel    Component Value Date/Time   CHOL 93 08/12/2017 0334   CHOL 129 07/10/2015 1418   TRIG 157 (H) 08/12/2017 0334   HDL 26 (L) 08/12/2017 0334   HDL 21 (L) 07/10/2015 1418   CHOLHDL 3.6 08/12/2017 0334   VLDL 31 08/12/2017 0334   LDLCALC 36 08/12/2017 0334   LDLCALC 45 07/10/2015 1418      Wt Readings from Last 3 Encounters:  02/06/18 216 lb (98 kg)  01/26/18 211 lb 10.3 oz (96 kg)  01/10/18 214 lb 12.8 oz (97.4 kg)      Other studies Reviewed:  Cardiac Cath 01/25/2018  LEFT HEART CATH AND CORS/GRAFTS ANGIOGRAPHY4/24/19  Conclusion     CULPRIT: Ost LAD to Prox LAD lesion is 99% stenosed.  A drug-eluting stent was successfully placed using a STENT SIERRA 3.00 X 15 MM. Post-dilated to 3.3 mm  Post intervention, there is a 0% residual stenosis.  Prox LAD to Mid LAD lesion is 100% stenosed after several septal perforators & diagonal branches.  Ost Cx to Prox Cx lesion is 55% stenosed after LAD stent --  PTCA only -- Balloon angioplasty was performed using a BALLOON EMERGE MR PUSH 1.20X12.  Post intervention, there is a 35% residual stenosis.  Non-stenotic Ost LM to Dist LM lesion previously treated with DES - patent.  Ost 1st Mrg lesion is 80% stenosed. - Unable to cross lesion with balloon.  Prox Cx to Mid Cx lesion previously treated with DES - patent after ostial ISR.  Mid RCA lesion is 100% stenosed.  Ost RCA to Prox RCA lesion is 70% stenosed.  Known occluson of SVG-RPDA & SVG-OM  LIMA and is normal in caliber. Prox Graft lesion is 20% stenosed.  The graft exhibits minimal luminal irregularities.  LV end diastolic pressure is moderately elevated.  There is no aortic valve stenosis.  Post intervention, there is a 0% residual  stenosis.  Successful DES stent in the proximal and ostial LAD. Unfortunately the stent did shift into the left main creating a somewhat culotte technique crossing the circumflex. I was able to wire the circumflex and balloon the stent struts with a 1.2 mm balloon. However guide support did not allow larger balloon  to cross.       ASSESSMENT AND PLAN:  1.  CAD: Multivessel with hx of CABG,  recent cardiac cath as above requiring DES to the proximal LAD and balloon stent strut intervention. He continues medically compliant and is without significant symptoms. He takes Tylenol for chest soreness only. He is now currently on medical disability.Continue current regimen without changes  I have reviewed cath illustration with the patient and answered questions. I have given him a copy of this.  2. Hypertension: Slightly elevated today. Will not make any changes in medical regimen.   3. Hypercholesterolemia: Remains on statin therapy with LDL goal of <70 Currently LDL of 36 on 08/12/2017.   Current medicines are reviewed at length with the patient today.    Labs/ tests ordered today include: None   Phill Myron. West Pugh, ANP, AACC   02/06/2018 9:35 AM    [facility] [address] [place], [place] [address] Phone: [phone]; Fax: [phone]

## 2018-02-03 ENCOUNTER — Ambulatory Visit: Payer: No Typology Code available for payment source

## 2018-02-06 ENCOUNTER — Telehealth (HOSPITAL_COMMUNITY): Payer: Self-pay

## 2018-02-06 ENCOUNTER — Encounter: Payer: Self-pay | Admitting: Adult Health

## 2018-02-06 ENCOUNTER — Ambulatory Visit (INDEPENDENT_AMBULATORY_CARE_PROVIDER_SITE_OTHER): Payer: No Typology Code available for payment source | Admitting: Adult Health

## 2018-02-06 VITALS — BP 138/80 | HR 80 | Ht 70.5 in | Wt 216.0 lb

## 2018-02-06 DIAGNOSIS — Z9861 Coronary angioplasty status: Secondary | ICD-10-CM

## 2018-02-06 DIAGNOSIS — I10 Essential (primary) hypertension: Secondary | ICD-10-CM

## 2018-02-06 DIAGNOSIS — I2510 Atherosclerotic heart disease of native coronary artery without angina pectoris: Secondary | ICD-10-CM

## 2018-02-06 DIAGNOSIS — E7800 Pure hypercholesterolemia, unspecified: Secondary | ICD-10-CM

## 2018-02-06 MED FILL — JANUVIA 100 MG TABLET: 100 | 30 days supply | Qty: 30 | Fill #4

## 2018-02-06 NOTE — Telephone Encounter (Signed)
Called patient to see if he is interested in the Cardiac Rehab Program. Patient stated he is interested. Explained scheduling process to patient. Went over insurance with patient and verbalized understanding. Will contact patient for scheduling upon review by the RN Navigator.

## 2018-02-06 NOTE — Telephone Encounter (Signed)
Patients insurance is active and benefits verified through [facility] - 05/06/18.  Will contact patient to see if he is interested in the Cardiac Rehab Program. If interested, patient will be contacted for scheduling upon review by the RN Navigator.

## 2018-02-06 NOTE — Patient Instructions (Signed)
Medication Instructions:  NO CHANGES- Your physician recommends that you continue on your current medications as directed. Please refer to the Current Medication list given to you today.  If you need a refill on your cardiac medications before your next appointment, please call your pharmacy.  Follow-Up: Your physician wants you to follow-up in: KEEP SCHEDULED F/U WITH DR [facility]   Thank you for choosing [facility]!!

## 2018-02-09 ENCOUNTER — Telehealth (HOSPITAL_COMMUNITY): Payer: Self-pay

## 2018-02-09 NOTE — Telephone Encounter (Signed)
Called to schedule patient for Cardiac Rehab - Patient stated that I follow up with him next week to if he receives his Medicare Card. Patient has financial assistance but he is unsure how he wants to use it. Will follow up.

## 2018-03-01 MED FILL — METOPROLOL SUCCINATE ER 200: 200 | 30 days supply | Qty: 30 | Fill #3

## 2018-03-07 ENCOUNTER — Telehealth (HOSPITAL_COMMUNITY): Payer: Self-pay

## 2018-03-07 NOTE — Telephone Encounter (Signed)
Called to follow up with patient in regards to Cardiac Rehab - Patient stated he cannot begin using Medicare until August. Patient would like for me to follow up then.

## 2018-03-10 MED FILL — JANUVIA 100 MG TABLET: 100 | 30 days supply | Qty: 30 | Fill #5

## 2018-03-17 ENCOUNTER — Other Ambulatory Visit: Payer: Self-pay | Admitting: Cardiovascular Disease

## 2018-03-17 DIAGNOSIS — I739 Peripheral vascular disease, unspecified: Secondary | ICD-10-CM

## 2018-03-20 ENCOUNTER — Ambulatory Visit (HOSPITAL_COMMUNITY)
Admission: RE | Admit: 2018-03-20 | Discharge: 2018-03-20 | Disposition: A | Discharge status: Home / Self Care | Payer: No Typology Code available for payment source | Source: Ambulatory Visit | Attending: Cardiovascular Disease | Admitting: Cardiovascular Disease

## 2018-03-20 DIAGNOSIS — I6523 Occlusion and stenosis of bilateral carotid arteries: Secondary | ICD-10-CM | POA: Insufficient documentation

## 2018-03-23 ENCOUNTER — Ambulatory Visit (HOSPITAL_COMMUNITY)
Admission: RE | Admit: 2018-03-23 | Discharge: 2018-03-23 | Disposition: A | Discharge status: Home / Self Care | Payer: No Typology Code available for payment source | Source: Ambulatory Visit | Attending: Cardiology | Admitting: Cardiology

## 2018-03-23 DIAGNOSIS — Z87891 Personal history of nicotine dependence: Secondary | ICD-10-CM | POA: Insufficient documentation

## 2018-03-23 DIAGNOSIS — I2510 Atherosclerotic heart disease of native coronary artery without angina pectoris: Secondary | ICD-10-CM | POA: Insufficient documentation

## 2018-03-23 DIAGNOSIS — R9389 Abnormal findings on diagnostic imaging of other specified body structures: Secondary | ICD-10-CM | POA: Insufficient documentation

## 2018-03-23 DIAGNOSIS — I10 Essential (primary) hypertension: Secondary | ICD-10-CM | POA: Insufficient documentation

## 2018-03-23 DIAGNOSIS — I739 Peripheral vascular disease, unspecified: Secondary | ICD-10-CM

## 2018-03-23 DIAGNOSIS — I252 Old myocardial infarction: Secondary | ICD-10-CM | POA: Insufficient documentation

## 2018-03-23 DIAGNOSIS — E1151 Type 2 diabetes mellitus with diabetic peripheral angiopathy without gangrene: Secondary | ICD-10-CM | POA: Insufficient documentation

## 2018-03-23 DIAGNOSIS — E785 Hyperlipidemia, unspecified: Secondary | ICD-10-CM | POA: Insufficient documentation

## 2018-04-03 ENCOUNTER — Telehealth: Payer: Self-pay | Admitting: *Deleted

## 2018-04-03 DIAGNOSIS — I6523 Occlusion and stenosis of bilateral carotid arteries: Secondary | ICD-10-CM

## 2018-04-03 DIAGNOSIS — I739 Peripheral vascular disease, unspecified: Secondary | ICD-10-CM

## 2018-04-03 MED FILL — METOPROLOL SUCCINATE ER 200: 200 | 30 days supply | Qty: 30 | Fill #4

## 2018-04-03 NOTE — Telephone Encounter (Signed)
-----   Message from Skeet Latch, MD sent at 04/02/2018  6:36 PM EDT ----- No aneurysm of the abdominal aorta.

## 2018-04-03 NOTE — Telephone Encounter (Signed)
Advised patient of results, orders and recalls in Epic

## 2018-04-03 NOTE — Telephone Encounter (Signed)
-----   Message from Skeet Latch, MD sent at 04/02/2018  6:37 PM EDT ----- ABIs unchanged from last year.  Mild blockage in the legs bilaterally.

## 2018-04-03 NOTE — Telephone Encounter (Signed)
-----   Message from Skeet Latch, MD sent at 04/02/2018  6:36 PM EDT ----- Mild to moderate blockage in the carotid arteries.  Repeat in 1 year.

## 2018-04-10 MED FILL — JANUVIA 100 MG TABLET: 100 | 30 days supply | Qty: 30 | Fill #6

## 2018-04-21 MED FILL — LISINOPRIL 10 MG TABLET: 10 | 90 days supply | Qty: 90 | Fill #2

## 2018-05-05 MED FILL — METOPROLOL SUCCINATE ER 200: 200 | 30 days supply | Qty: 30 | Fill #5

## 2018-05-08 ENCOUNTER — Telehealth (HOSPITAL_COMMUNITY): Payer: Self-pay

## 2018-05-08 NOTE — Telephone Encounter (Signed)
Attempted to follow up with patient in regards to Cardiac Rehab - lm on vm °

## 2018-05-08 NOTE — Telephone Encounter (Signed)
Patient returned phone call and stated his insurance will kick in 06/04/18. Will follow up then.

## 2018-05-15 MED FILL — JANUVIA 100 MG TABLET: 100 | 30 days supply | Qty: 30 | Fill #7

## 2018-05-23 ENCOUNTER — Ambulatory Visit (INDEPENDENT_AMBULATORY_CARE_PROVIDER_SITE_OTHER): Payer: No Typology Code available for payment source | Admitting: Cardiovascular Disease

## 2018-05-23 ENCOUNTER — Encounter: Payer: Self-pay | Admitting: Cardiovascular Disease

## 2018-05-23 VITALS — BP 116/75 | HR 85 | Ht 70.5 in | Wt 214.2 lb

## 2018-05-23 DIAGNOSIS — I739 Peripheral vascular disease, unspecified: Secondary | ICD-10-CM

## 2018-05-23 DIAGNOSIS — I6523 Occlusion and stenosis of bilateral carotid arteries: Secondary | ICD-10-CM

## 2018-05-23 DIAGNOSIS — I2510 Atherosclerotic heart disease of native coronary artery without angina pectoris: Secondary | ICD-10-CM

## 2018-05-23 DIAGNOSIS — I10 Essential (primary) hypertension: Secondary | ICD-10-CM

## 2018-05-23 DIAGNOSIS — Z955 Presence of coronary angioplasty implant and graft: Secondary | ICD-10-CM

## 2018-05-23 NOTE — Progress Notes (Signed)
Cardiology Office Note   Date:  05/23/2018   ID:  Jason Velasquez, DOB 01-31-1956, MRN [id number]  PCP:  [name], DO  Cardiologist:   Skeet Latch, MD   No chief complaint on file.       Patient ID: Jason Velasquez is a 62 y.o. male with CAD s/p CABG ( LIMA-->LAD, SVG-->PDA, SVG-->OM1), multiple PCIs , chronic stable angina, PAD, hypertension, diabetes and CVA who presents for follow up.  Jason Velasquez was first evaluated for chest pain on 07/27/15.  He previously underwent CABG in 2007. He had a nuclear stress test on 08/21/15 revealed LVEF 57% with moderate ischemia in the inferoseptal, inferior, and anterior locations. He subsequently underwent left heart catheterization on 08/26/15 that revealed severe three vessel disease including a 90% left main lesion and an occluded RCA and LAD. His LIMA to the LAD was patent but all other vein grafts were occluded. There were no optimal PCI targets.  He was started on carvedilol and Ranexa. Jason Velasquez also underwent ABI testing 08/2015 that revealed diffuse atherosclerosis from the proximal abdominal aorta through the common and external iliac arteries bilaterally. There was no detectable flow in the L peroneal or anterior tibial arteries. ABIs were near 0.8 bilaterally. He was referred to Dr. Gwenlyn Found where medical management was advised. He was noted to have carotid bruits and was referred for carotid ultrasound on 12/09/15.  This revealed 40-59% stenosis on the right and 1-39% stenosis on the left.  He had repeat LE Dopplers and ABIs 03/2017 that were unchanged from prior.  Medical management was recommended.    Jason Velasquez continued to have worsening angina despite optimal medical therapy.  He was referred for [facility]  05/2017 where he was found to have progression of the distal LM/LAD/LCx lesion.  He underwent DES placement in the ostial LCx, mid LCx, and ostial LAD.  The ostial LAD was treated with a cutting ballpon reducing the stenosis from 85% to 30%.   It was recommended that he continue clopidogrel and aspirin indefinitely.  He had an echo 06/01/17 that showed LVEF 65-70% with grade 1 diastolic dysfunction.  He followed up on 07/2017 and continued to have angina especially at night.  There is some concern that it may also be GERD.  Therefore he was given a trial of pantoprazole and Imdur 60 mg daily was restarted.  He called our office 11/7 with progressive symptoms.  He was referred for cardiac catheterization but was found to be anemic with a hemoglobin of 7.7.  He was transfused and underwent upper endoscopy on 11/9.  He was found to have a hiatal hernia and 2 AVMs that were treated with APC.  He also received IV iron.  He subsequently underwent left heart catheterization that again showed severe three-vessel disease not amenable to PCI.  He continues to have refractory angina despite optimal medical therapy.  He had another left heart catheterization 01/25/2018 at which time his ostial LAD was found to be 99% stenosed.  A Sierra DES was successfully implanted.  Since his last appointment Jason Velasquez has been feeling much better.  He has very rare episodes of chest discomfort that happen after he has walked for very long distances or when he pushes himself too much.  He is more limited by his claudication.  He plans her with cardiac rehab next month when his insurance changes.  He has no lower extremity edema, orthopnea, or PND.  He does note that he gets short of  breath after eating large meals.  He was noted to have a hiatal hernia.  Past Medical History:  Diagnosis Date  . Angina, class III ([place]) 08/24/2015  . Arthritis    "hands" (01/25/2018)  . AVM (arteriovenous malformation) 08/15/2017  . CAD (coronary artery disease) of artery bypass graft    CABG- 2007   . Carotid stenosis 03/03/2016   16-10% RICA, 9-60% LICA, >45% RECA stenosis Repeat 12/2016.  . Claudication ([place])   . Controlled type 2 diabetes mellitus without complication ([place]) 4/0/9811     Dx March of 2016. GAD65 negative Anti Islet cell negative    . DKA (diabetic ketoacidoses) ([place]) 12/07/2014  . Dyspnea   . History of blood transfusion ~ 08/2017   "related to LGIB"  . History of hiatal hernia   . Hx of adenomatous colonic polyps 05/27/2015  . Hypercholesterolemia   . Hypertension   . Hypertensive heart disease 03/03/2016  . Migraine    "only in my 30's" (01/25/2018)  . Pneumonia ~ 1990  . Stroke [facility]) 07/12/2014   "I didn't realize I'd had one; hospital found it"; denies residual on 01/25/2018  . Tubular adenoma of colon 05/31/2015   Colonoscopy 05/20/15 with [name]: 3 tubullar adenoma's largest 2.3cm.  Plan for repeat Colonoscopy in 2019.    Past Surgical History:  Procedure Laterality Date  . CARDIAC CATHETERIZATION N/A 08/26/2015   Procedure: Left Heart Cath and Cors/Grafts Angiography;  Surgeon: [name], MD;  Location: [place] [facility];  Service: Cardiovascular;  Laterality: N/A;  . CORONARY ARTERY BYPASS GRAFT  2007   "CABG X3", at [facility]   . CORONARY BALLOON ANGIOPLASTY N/A 05/25/2017   Procedure: CORONARY BALLOON ANGIOPLASTY;  Surgeon: [name], MD;  Location: [place] [facility];  Service: Cardiovascular;  Laterality: N/A;  . CORONARY BALLOON ANGIOPLASTY N/A 01/25/2018   Procedure: CORONARY BALLOON ANGIOPLASTY;  Surgeon: [name], MD;  Location: [place] [facility];  Service: Cardiovascular;  Laterality: N/A;  . CORONARY STENT INTERVENTION N/A 05/25/2017   Procedure: CORONARY STENT INTERVENTION;  Surgeon: [name], MD;  Location: [place] [facility];  Service: Cardiovascular;  Laterality: N/A;  . CORONARY STENT INTERVENTION N/A 01/25/2018   Procedure: CORONARY STENT INTERVENTION;  Surgeon: [name], MD;  Location: [place] [facility];  Service: Cardiovascular;  Laterality: N/A;  . ESOPHAGOGASTRODUODENOSCOPY N/A 08/12/2017   Procedure: ESOPHAGOGASTRODUODENOSCOPY (EGD);  Surgeon: Jerene Bears, MD;  Location[facility];  Service: Gastroenterology;  Laterality: N/A;  . LEFT HEART CATH AND CORS/GRAFTS ANGIOGRAPHY N/A 05/23/2017   Procedure: LEFT HEART CATH AND CORS/GRAFTS ANGIOGRAPHY;  Surgeon: [name], MD;  Location: [place] [facility];  Service: Cardiovascular;  Laterality: N/A;  . LEFT HEART CATH AND CORS/GRAFTS ANGIOGRAPHY N/A 08/15/2017   Procedure: LEFT HEART CATH AND CORS/GRAFTS ANGIOGRAPHY;  Surgeon: Burnell Blanks, MD;  Location: [place] [facility];  Service: Cardiovascular;  Laterality: N/A;  . LEFT HEART CATH AND CORS/GRAFTS ANGIOGRAPHY N/A 01/25/2018   Procedure: LEFT HEART CATH AND CORS/GRAFTS ANGIOGRAPHY;  Surgeon: [name], MD;  Location: [place] [facility];  Service: Cardiovascular;  Laterality: N/A;    Current Outpatient Medications  Medication Sig Dispense Refill  . acetaminophen (TYLENOL) 500 MG tablet Take 1,000 mg by mouth every 6 (six) hours as needed for moderate pain or headache.    Marland Kitchen aspirin EC 81 MG tablet Take 81 mg by mouth daily.    Marland Kitchen atorvastatin (LIPITOR) 40 MG  tablet Take 1 tablet (40 mg total) by mouth every evening. 30 tablet 11  . clopidogrel (PLAVIX) 75 MG tablet Take 1 tablet (75 mg total) by mouth daily with breakfast. 30 tablet 11  . ferrous sulfate 325 (65 FE) MG tablet Take 1 tablet (325 mg total) by mouth every other day. 100 tablet 0  . Hypromellose (ARTIFICIAL TEARS OP) Place 1 drop into both eyes daily as needed (for dry eyes).     . isosorbide mononitrate (IMDUR) 120 MG 24 hr tablet 1/2 TABLET BY MOUTH IN THE MORNING AND 1 IN THE EVENING (Patient taking differently: Take 60-120 mg by mouth See admin instructions. Take 60 mg by mouth in the morning and take 120 mg by mouth in the evening) 45 tablet 5  . Lancets MISC 1 Device by Does not apply route 4 (four) times daily -  before meals and at bedtime. 100 each 11  . lisinopril (PRINIVIL,ZESTRIL) 10 MG tablet TAKE 1 TABLET BY MOUTH DAILY. (Patient taking differently: TAKE 10 MG BY  MOUTH DAILY.) 90 tablet 3  . metFORMIN (GLUCOPHAGE) 1000 MG tablet Take 1 tablet (1,000 mg total) by mouth 2 (two) times daily with a meal. 60 tablet 11  . metoprolol (TOPROL-XL) 200 MG 24 hr tablet TAKE 1 TABLET BY MOUTH DAILY. TAKE WITH OR IMMEDIATELY FOLLOWING A MEAL. (Patient taking differently: TAKE 200 MG BY MOUTH DAILY. TAKE WITH OR IMMEDIATELY FOLLOWING A MEAL.) 30 tablet 11  . nitroGLYCERIN (NITROSTAT) 0.4 MG SL tablet Place 1 tablet (0.4 mg total) under the tongue every 5 (five) minutes as needed for chest pain. 25 tablet 2  . omeprazole (PRILOSEC) 20 MG capsule Take 40 mg by mouth at bedtime.     . ranolazine (RANEXA) 1000 MG SR tablet Take 1,000 mg by mouth 2 (two) times daily.    . sitaGLIPtin (JANUVIA) 100 MG tablet Take 1 tablet (100 mg total) by mouth daily. 90 tablet 3   No current facility-administered medications for this visit.     Allergies:   Patient has no known allergies.    Social History:  The patient  reports that he quit smoking about 8 years ago. His smoking use included cigarettes. He has a 76.00 pack-year smoking history. He has never used smokeless tobacco. He reports that he drinks alcohol. He reports that he does not use drugs.   Family History:  The patient's family history includes Diabetes in his father; Hypertension in his mother and sister.    ROS:  Please see the history of present illness.   Otherwise, review of systems are positive for none.   All other systems are reviewed and negative.    PHYSICAL EXAM: VS:  BP 116/75   Pulse 85   Ht 5' 10.5" (1.791 m)   Wt 214 lb 3.2 oz (97.2 kg)   SpO2 100%   BMI 30.30 kg/m  , BMI Body mass index is 30.3 kg/m. GENERAL:  Well appearing HEENT: Pupils equal round and reactive, fundi not visualized, oral mucosa unremarkable NECK:  No jugular venous distention, waveform within normal limits, carotid upstroke brisk and symmetric, + carotid bruits LUNGS:  Clear to auscultation bilaterally HEART:  RRR.  PMI not  displaced or sustained,S1 and S2 within normal limits, no S3, no S4, no clicks, no rubs, no murmurs ABD:  Flat, positive bowel sounds normal in frequency in pitch, no bruits, no rebound, no guarding, no midline pulsatile mass, no hepatomegaly, no splenomegaly EXT:  2 plus pulses throughout, no edema,  no cyanosis no clubbing SKIN:  No rashes no nodules NEURO:  Cranial nerves II through XII grossly intact, motor grossly intact throughout PSYCH:  Cognitively intact, oriented to person place and time   EKG:  EKG is not ordered today. The ekg ordered today demonstrates sinus rhythm rate 82 bpm.  Prior inferior infarct.  07/02/16: Sinus rhythm rate 75 bpm.  Prior inferior infarct  12/29/16: Sinus rhythm.  Incomplete RBBB.  Prior inferior infarct.  05/18/17: Sinus rhythm. Rate 87 bpm. B 1 PVCs. 07/18/17: Sinus rhythm.  Rate 71 bpm.  08/11/17: Sinus rhythm.  Rate 77 bpm.     LHC 08/15/17:  Mid RCA lesion is 100% stenosed.  Ost RCA to Prox RCA lesion is 70% stenosed.  Ost 1st Mrg lesion is 60% stenosed.  Previously placed Ost 1st Mrg to 1st Mrg stent (unknown type) is widely patent.  Origin lesion is 100% stenosed.  Origin lesion is 100% stenosed.  Prox Graft lesion is 20% stenosed.  Prox LAD to Mid LAD lesion is 100% stenosed.  Previously placed Ost Cx to Prox Cx stent (unknown type) is widely patent.  Previously placed Ost LM to Dist LM stent (unknown type) is widely patent.  Previously placed Prox Cx to Mid Cx stent (unknown type) is widely patent.  SVG graft was not visualized.  SVG graft was not visualized.  Post intervention, there is a -1% residual stenosis.  LIMA graft was visualized by angiography and is normal in caliber.  The graft exhibits minimal luminal irregularities.  Post intervention, there is a -1% residual stenosis.  Ost LAD to Prox LAD lesion is 99% stenosed.  Post intervention, there is a -1% residual stenosis.   1. Severe triple vessel CAD s/p CABG  with 1/3 patent bypass grafts.  2. The LAD is known to be chronically occluded in the mid segment. The mid and distal LAD fills from the patent LIMA graft. The ostial LAD is jailed by the stent extending from the left main into the Circumflex. The proximal LAD has a 99% stenosis following by an aneurysmal segment then a total occlusion.  3. The left main stent is patent 4. The ostial/proximal Circumflex stent is patent. The first OM stent is patent. There is a 60% ostial stenosis in the first OM branch just prior to the stent. The SVG to the OM is known to be occluded. The mid Circumflex stent is patent.  5. The native RCA has a long 70% proximal stenosis and 100% mid chronic occlusion. The vein graft the to distal RCA is known to be occluded and was not injected.  6. Normal filling pressures  Recommendations: Continue medical management of CAD. No focal targets for PCI. The proximal LAD stenosis is severe but the majority of this territory is supplied by the LIMA graft and the LAD is jailed by the LM/Circ stent. I suspect that his anemia has contributed to his recent chest pain given his severe, diffuse CAD.    Echo 06/01/17: Study Conclusions  - Procedure narrative: Transthoracic echocardiography. Image   quality was poor. Intravenous contrast (Definity) was   administered. - Left ventricle: The cavity size was normal. There was mild focal   basal hypertrophy of the septum. Systolic function was vigorous.   The estimated ejection fraction was in the range of 65% to 70%.   Wall motion was normal; there were no regional wall motion   abnormalities. Doppler parameters are consistent with abnormal   left ventricular relaxation (grade 1 diastolic dysfunction). -  Aortic valve: Trileaflet; mildly thickened, mildly calcified   leaflets. - Left atrium: The atrium was mildly dilated.   Lexiscan Cardiolite 08/21/15:  Nuclear stress EF: 57%.  The left ventricular ejection fraction is normal  (55-65%).  There was no ST segment deviation noted during stress.  No T wave inversion was noted during stress.  Defect 1: There is a small defect of moderate severity present in the basal inferoseptal and basal inferior location.  Defect 2: There is a defect present in the basal anterior, mid anterior and apical anterior location.  Findings consistent with ischemia.  This is a high risk study.  High risk stress nuclear study with two areas of ischemia and normal left ventricular regional and global systolic function. The inferior area of ischemia is small. The anterior area of ischemia is extensive, but appears to be very mild. The septum is spared.  Carotid Doppler 12/11/15: 09-32% RICA, 6-71% LICA, >24% RECA stenosis  ABI 08/12/15: R 0.81, L 0.89 Diffuse atherosclerosis from the proximal abdominal aorta into both tibial arteries. Several areas of narrowing are noted in both lower extremities, although no focal significant stenosis is identified. One vessel run-off on the right, and three vessel run-off on the left.  ABI 02/03/17: R 0.81, L 0.91 Abnormal lower arterial Doppler study, with waveforms suggestive of inflow disease. The bilateral ABI's are stable and in the mild range. The right great toe pressure is normal. The left great toe pressure is abnormal   Recent Labs: 01/26/2018: BUN 8; Creatinine, Ser 1.13; Hemoglobin 9.5; Platelets 243; Potassium 4.0; Sodium 138    Lipid Panel    Component Value Date/Time   CHOL 93 08/12/2017 0334   CHOL 129 07/10/2015 1418   TRIG 157 (H) 08/12/2017 0334   HDL 26 (L) 08/12/2017 0334   HDL 21 (L) 07/10/2015 1418   CHOLHDL 3.6 08/12/2017 0334   VLDL 31 08/12/2017 0334   LDLCALC 36 08/12/2017 0334   LDLCALC 45 07/10/2015 1418      Wt Readings from Last 3 Encounters:  05/23/18 214 lb 3.2 oz (97.2 kg)  02/06/18 216 lb (98 kg)  01/26/18 211 lb 10.3 oz (96 kg)      ASSESSMENT AND PLAN:  # CAD s/p CABG:  # Angina:  Mr.  Poer has severe 3 vessel CAD and all his CABG grafts are occluded with the exception of his LIMA to the LAD.  He underwent successful PCI of the LM, LCx and OM1.  Ostial LAD was 99% stenosed 01/2018 and he had a successful DES implanted.  He is feeling much better and hasn't needed any nitroglycerin. For now, continue aspirin, atorvastatin, clopidogrel, Imdur, lisinopril, metoprolol and ranolazine.  Lifelong DAPT.  He will start cardiac rehab next month.  # Hypertension: Blood pressure well-controlled.  Continue lisinopril, metoprolol, and  Imdur as above.   # Hyperlipidemia:  Continue atorvastatin and fish oil. LDL 36 08/2017.    # Claudication: # PAD:  ABIs 0.8 bilaterally and medical management was recommended in 2016.  Unchanged on repeat.  Encouraged ambulation. Repeat ABIs 03/2018.  Continue aspirin and statin.  Dr. Heber [place] felt symptoms were more PAD related than sciatica.  He recommended walking regimen, which would help both.  He will start rehab next month.   # Carotid stenosis: Carotid Dopplers 02/2017 revealed 50-49% R ICA stenosis and 1-39% stenosis on the left.  Continue aspirin and atorvastatin.   Current medicines are reviewed at length with the patient today.  The patient does not have  concerns regarding medicines.  The following changes have been made:  none  Labs/ tests ordered today include:   No orders of the defined types were placed in this encounter.    Disposition:   FU with Tahirih Lair C. Oval Linsey, MD in 1 year.  Jory Sims, DNP in 6 months.    Signed, Skeet Latch, MD  05/23/2018 1:44 PM    [facility]

## 2018-05-23 NOTE — Patient Instructions (Signed)
Medication Instructions:  Your physician recommends that you continue on your current medications as directed. Please refer to the Current Medication list given to you today.  Labwork: none  Testing/Procedures: none  Follow-Up: Your physician recommends that you schedule a follow-up appointment in: [place] physician wants you to follow-up in: [place] will receive a reminder letter in the mail two months in advance. If you don't receive a letter, please call our office to schedule the follow-up appointment.  Any Other Special Instructions Will Be Listed Below (If Applicable).  CALL NEXT MONTH TO ARRANGE CARDIAC REHAB WHEN YOUR INSURANCE WILL ALLOW    If you need a refill on your cardiac medications before your next appointment, please call your pharmacy.

## 2018-06-04 ENCOUNTER — Other Ambulatory Visit: Payer: Self-pay | Admitting: Cardiovascular Disease

## 2018-06-06 MED FILL — METOPROLOL SUCCINATE ER 200: 200 | 30 days supply | Qty: 30 | Fill #6

## 2018-06-07 ENCOUNTER — Telehealth (HOSPITAL_COMMUNITY): Payer: Self-pay

## 2018-06-07 NOTE — Telephone Encounter (Signed)
Attempted to call patient in regards to Cardiac Rehab - LM on VM 

## 2018-06-07 NOTE — Telephone Encounter (Signed)
Was able to check passport to see if pt Medicare was active. Patient is active with Medicare A&B.  Pt insurance is active and benefits verified through Medicare. Co-pay $0.00, DED $185.00/$0.00 met, out of pocket $0.00/$0.00 met, co-insurance 20%. No pre-authorization. Passport, 06/07/18 @ 9:02AM , OIP#[id number]

## 2018-06-14 NOTE — Telephone Encounter (Signed)
Called patient in regards to CR, pt stated he is not able to join at this time due to transportation.  Closed referral

## 2018-06-15 ENCOUNTER — Other Ambulatory Visit: Payer: Self-pay | Admitting: *Deleted

## 2018-06-16 ENCOUNTER — Other Ambulatory Visit: Payer: Self-pay

## 2018-06-16 MED ORDER — CLOPIDOGREL BISULFATE 75 MG PO TABS: 75.0000 mg | ORAL_TABLET | Freq: Every day | ORAL | 3 refills | Status: DC

## 2018-06-21 ENCOUNTER — Other Ambulatory Visit: Payer: Self-pay | Admitting: *Deleted

## 2018-06-21 MED FILL — JANUVIA 100 MG TABLET: 100 | 30 days supply | Qty: 30 | Fill #8

## 2018-06-22 MED ORDER — METFORMIN HCL 1000 MG PO TABS: 1000.0000 mg | ORAL_TABLET | Freq: Two times a day (BID) | ORAL | 3 refills | Status: DC

## 2018-07-10 MED FILL — METOPROLOL SUCCINATE ER 200: 200 | 30 days supply | Qty: 30 | Fill #7

## 2018-07-24 MED FILL — LISINOPRIL 10 MG TABLET: 10 | 90 days supply | Qty: 90 | Fill #3

## 2018-07-24 MED FILL — JANUVIA 100 MG TABLET: 100 | 30 days supply | Qty: 30 | Fill #9

## 2018-08-04 ENCOUNTER — Other Ambulatory Visit: Payer: Self-pay

## 2018-08-04 MED FILL — METOPROLOL SUCCINATE ER 200: 200 | 30 days supply | Qty: 30 | Fill #8

## 2018-08-04 NOTE — Telephone Encounter (Signed)
atorvastatin (LIPITOR) 40 MG tablet(Expired), refill request @  [place], [place] - [address] [phone] (Phone) [phone] (Fax)

## 2018-08-05 MED ORDER — ATORVASTATIN CALCIUM 40 MG PO TABS: 40.0000 mg | ORAL_TABLET | Freq: Every evening | ORAL | 0 refills | Status: DC

## 2018-08-18 ENCOUNTER — Telehealth: Payer: Self-pay | Admitting: *Deleted

## 2018-08-18 NOTE — Telephone Encounter (Signed)
Advised patient, verbalized understanding  

## 2018-08-18 NOTE — Telephone Encounter (Signed)
Information for patient assistance for Ranexa faxed. Received notification from company it has been denied secondary to generic medication becoming available in January. Left message to call back

## 2018-08-28 MED FILL — JANUVIA 100 MG TABLET: 100 | 30 days supply | Qty: 30 | Fill #10

## 2018-09-04 MED FILL — METOPROLOL SUCCINATE ER 200: 200 | 30 days supply | Qty: 30 | Fill #9

## 2018-09-11 ENCOUNTER — Encounter: Payer: Self-pay | Admitting: Internal Medicine

## 2018-09-21 ENCOUNTER — Ambulatory Visit (INDEPENDENT_AMBULATORY_CARE_PROVIDER_SITE_OTHER): Payer: PPO | Admitting: Dietician

## 2018-09-21 ENCOUNTER — Other Ambulatory Visit: Payer: Self-pay

## 2018-09-21 ENCOUNTER — Encounter: Payer: Self-pay | Admitting: Dietician

## 2018-09-21 ENCOUNTER — Ambulatory Visit (INDEPENDENT_AMBULATORY_CARE_PROVIDER_SITE_OTHER): Payer: PPO | Admitting: Internal Medicine

## 2018-09-21 ENCOUNTER — Encounter: Payer: Self-pay | Admitting: Internal Medicine

## 2018-09-21 VITALS — BP 121/62 | HR 76 | Temp 98.0°F | Ht 70.0 in | Wt 215.0 lb

## 2018-09-21 DIAGNOSIS — Z713 Dietary counseling and surveillance: Secondary | ICD-10-CM | POA: Diagnosis not present

## 2018-09-21 DIAGNOSIS — Z7982 Long term (current) use of aspirin: Secondary | ICD-10-CM

## 2018-09-21 DIAGNOSIS — Z86010 Personal history of colonic polyps: Secondary | ICD-10-CM

## 2018-09-21 DIAGNOSIS — Z7902 Long term (current) use of antithrombotics/antiplatelets: Secondary | ICD-10-CM | POA: Diagnosis not present

## 2018-09-21 DIAGNOSIS — Z7984 Long term (current) use of oral hypoglycemic drugs: Secondary | ICD-10-CM

## 2018-09-21 DIAGNOSIS — Z79899 Other long term (current) drug therapy: Secondary | ICD-10-CM

## 2018-09-21 DIAGNOSIS — E11319 Type 2 diabetes mellitus with unspecified diabetic retinopathy without macular edema: Secondary | ICD-10-CM

## 2018-09-21 DIAGNOSIS — I25118 Atherosclerotic heart disease of native coronary artery with other forms of angina pectoris: Secondary | ICD-10-CM

## 2018-09-21 DIAGNOSIS — E1151 Type 2 diabetes mellitus with diabetic peripheral angiopathy without gangrene: Secondary | ICD-10-CM

## 2018-09-21 DIAGNOSIS — D509 Iron deficiency anemia, unspecified: Secondary | ICD-10-CM | POA: Diagnosis not present

## 2018-09-21 DIAGNOSIS — Z6830 Body mass index (BMI) 30.0-30.9, adult: Secondary | ICD-10-CM

## 2018-09-21 DIAGNOSIS — Z23 Encounter for immunization: Secondary | ICD-10-CM | POA: Diagnosis not present

## 2018-09-21 DIAGNOSIS — Z860101 Personal history of adenomatous and serrated colon polyps: Secondary | ICD-10-CM

## 2018-09-21 DIAGNOSIS — IMO0001 Reserved for inherently not codable concepts without codable children: Secondary | ICD-10-CM

## 2018-09-21 DIAGNOSIS — I10 Essential (primary) hypertension: Secondary | ICD-10-CM

## 2018-09-21 DIAGNOSIS — I25119 Atherosclerotic heart disease of native coronary artery with unspecified angina pectoris: Secondary | ICD-10-CM

## 2018-09-21 LAB — POCT GLYCOSYLATED HEMOGLOBIN (HGB A1C): HEMOGLOBIN A1C: 7.3 % — AB (ref 4.0–5.6)

## 2018-09-21 LAB — GLUCOSE, CAPILLARY: GLUCOSE-CAPILLARY: 160 mg/dL — AB (ref 70–99)

## 2018-09-21 MED ORDER — FERROUS SULFATE 325 (65 FE) MG PO TABS: 325.0000 mg | ORAL_TABLET | ORAL | 1 refills | Status: AC

## 2018-09-21 NOTE — Assessment & Plan Note (Signed)
HPI: He reports he is adherent with his medications denies any hyperglycemia, polyuria or polydipsia.  His Januvia cost $40 co-pay.  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 Discussed he needs repeat ophthalmology exam.

## 2018-09-21 NOTE — Assessment & Plan Note (Signed)
HPI: He does not notice any significant blood loss his stools are normal in color.  He notes occasional nosebleed with forceful blowing of his nose.  He reports he did take the initial prescription ferrous sulfate as directed however he ran out a few months ago and has not taken anymore.  Assessment iron deficiency anemia  Plan repeat CBC and iron studies.

## 2018-09-21 NOTE — Progress Notes (Signed)
Subjective:  HPI: [name] is a 62 y.o. male who presents for follow-up of diabetes, hypertension and iron deficiency anemia  Please see Assessment and Plan below for the status of his chronic medical problems.  Review of Systems: Review of Systems  Constitutional: Positive for malaise/fatigue. Negative for fever and weight loss.  Respiratory: Negative for cough.   Cardiovascular: Positive for chest pain and claudication. Negative for orthopnea and leg swelling.  Gastrointestinal: Negative for blood in stool and melena.  Genitourinary: Negative for frequency and hematuria.  Musculoskeletal: Negative for myalgias.    Objective:  Physical Exam: Vitals:   09/21/18 1052  BP: 121/62  Pulse: 76  Temp: 98 F (36.7 C)  TempSrc: Oral  SpO2: 100%  Weight: 215 lb (97.5 kg)  Height: 5\' 10"  (1.778 m)   Body mass index is 30.85 kg/m. Physical Exam Vitals signs and nursing note reviewed.  Constitutional:      Appearance: Normal appearance.  Cardiovascular:     Rate and Rhythm: Tachycardia present.     Pulses: Normal pulses.     Heart sounds: No murmur.  Pulmonary:     Effort: Pulmonary effort is normal.     Breath sounds: Normal breath sounds.  Musculoskeletal:     Right lower leg: No edema.     Left lower leg: No edema.  Neurological:     Mental Status: He is alert.  Psychiatric:        Mood and Affect: Mood normal.    Assessment & Plan:  Essential hypertension HPI: He reports taking his blood pressure medications as directed he has been seeing Dr. Oval Velasquez for his cardiovascular care and is doing very well.  Assessment essential hypertension well-controlled  Plan Continue lisinopril 10 mg daily, metoprolol XL 200 mg daily  Coronary artery disease involving native coronary artery of native heart with angina pectoris (HCC) HPI: Been seen Dr. Keitha Velasquez.  Is adherent to his metoprolol, Ranexa, Imdur.  He is now retired however occasionally with exertion he  does have some anginal pains for which he is relieved with his nitroglycerin tablets.  He reports to me that that he will have a repeat cardiac catheterization in the new year.  Assessment coronary artery disease with stable angina  Plan he is on appropriate medical therapy with aspirin Plavix as well as max dose metoprolol succinate, max dose Imdur, and Ranexa.  He is also adherent with his atorvastatin 40 mg and his LDL is excellent.  I discussed with him that I think his cardiologist is doing a very good job with his care.  I would like to make sure his anemia is well treated as this could be contributing.  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  His Januvia cost $40 co-pay.  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 Discussed he needs repeat ophthalmology exam.  Hx of adenomatous colonic polyps His last colonoscopy in 2016 had multiple adenomas with the largest greater than 2 cm plan for repeat colonoscopy ~19 he has not done this yet.  He does have iron deficiency anemia  and I stressed the importance of having a repeat colonoscopy.  He will call Dr. Celesta Velasquez office for follow-up.  Iron deficiency anemia HPI: He does not notice any significant blood loss his stools are normal in color.  He notes occasional nosebleed with forceful blowing of his nose.  He reports he did take the initial prescription ferrous sulfate as directed however he ran out a few months ago and has not taken anymore.  Assessment iron deficiency  anemia  Plan repeat CBC and iron studies.   Medications Ordered Meds ordered this encounter  Medications  . ferrous sulfate 325 (65 FE) MG tablet    Sig: Take 1 tablet (325 mg total) by mouth every other day.    Dispense:  100 tablet    Refill:  1   Other Orders Orders Placed This Encounter  Procedures  . Flu Vaccine QUAD 36+ mos IM  . Lipid Profile  . CBC with Diff  . BMP8+Anion Gap  . Ferritin  . Iron and IBC ([id number])  . Glucose, capillary  . POC Hbg A1C   Follow Up: Return in about 3 months (around 12/21/2018).

## 2018-09-21 NOTE — Assessment & Plan Note (Signed)
His last colonoscopy in 2016 had multiple adenomas with the largest greater than 2 cm plan for repeat colonoscopy ~19 he has not done this yet.  He does have iron deficiency anemia and I stressed the importance of having a repeat colonoscopy.  He will call Dr. Celesta Aver office for follow-up.

## 2018-09-21 NOTE — Addendum Note (Signed)
Addended by: Resa Miner on: 09/21/2018 03:05 PM   Modules accepted: Orders

## 2018-09-21 NOTE — Progress Notes (Signed)
  Medical Nutrition Therapy:  Appt start time: 8088 end time:  1230. Visit # 1  Assessment:  Primary concerns today: glycemic control and weight loss.  Mr. Vaquera presents today for assistance with lowering his blood sugar and decreasing his weight. He reports that he feels no mater what he does his weight stays between 205-215#. He is thinking about starting to be more physically active. He does his own shopping and cooking and gets plenty of fruits, nuts and vegetables in each day now that he has bought a nutrabullet. He also eats out fairly often and gets fried foods and often meals heavy in fat and starch. He is thinking about purchasing an air fryer.  Preferred Learning Style: No preference indicated  Learning Readiness: Contemplating/Ready  ANTHROPOMETRICS: Estimated body mass index is 30.85 kg/m as calculated from the following:   Height as of an earlier encounter on 09/21/18: 5\' 10"  (1.778 m).   Weight as of an earlier encounter on 09/21/18: 215 lb (97.5 kg).  WEIGHT HISTORY: Wt Readings from Last 5 Encounters:  09/21/18 215 lb (97.5 kg)  05/23/18 214 lb 3.2 oz (97.2 kg)  02/06/18 216 lb (98 kg)  01/26/18 211 lb 10.3 oz (96 kg)  01/10/18 214 lb 12.8 oz (97.4 kg)   Usual physical activity: watches TV often during day and night time, works sometimes doing odd jobs SLEEP:he feels his sleep is adequate DIABETES MEDICATIONS: Januvia and metformin BLOOD SUGAR: reviewed with patient DIETARY INTAKE: Usual eating pattern includes 2-3 meals and 2-3 snacks per day. Everyday foods include smoothies, fruit, cereal, coffee.  Avoided foods include regular soda, juice Dining Out (times/week): 3-5 24-hr recall:  B ( AM): cereal and 1-2% milk or 1-2 bananas with 1-2 slices toast, coffee  L ( PM): skips or has fruit Snk ( PM): fast foods which is usually a sandwich and fries or can be fried chicken, biscuit, mashed potatoes and coleslaw D (11 PM): homemade salad Snk ( 12-1 A):  orange Beverages: water, coffee, diet soda x1/day  Progress Towards Goal(s):  In progress.   Nutritional Diagnosis:  NB-1.1 Food and nutrition-related knowledge deficit As related to not being aware of calorie, carb and fat content of some foods.  As evidenced by his report and questions.    Intervention:  Nutrition education/review about the calorie, fat and carb content of foods he is consuming.  Action Goal: eat more low carb veggies instead of potatoes  Outcome goal: decreased weight and blood sugars Coordination of care:   Teaching Method Utilized: Visual, Auditory,Hands on Handouts given during visit include: tracking my progress Barriers to learning/adherence to lifestyle change: competing values Demonstrated degree of understanding via:  Teach Back   Monitoring/Evaluation:  Dietary intake, exercise, meter, and body weight in 4 week(s). [name], RD 09/21/2018 3:01 PM.

## 2018-09-21 NOTE — Assessment & Plan Note (Signed)
HPI: He reports taking his blood pressure medications as directed he has been seeing Dr. Oval Linsey for his cardiovascular care and is doing very well.  Assessment essential hypertension well-controlled  Plan Continue lisinopril 10 mg daily, metoprolol XL 200 mg daily

## 2018-09-21 NOTE — Assessment & Plan Note (Signed)
HPI: Been seen Dr. Keitha [place].  Is adherent to his metoprolol, Ranexa, Imdur.  He is now retired however occasionally with exertion he does have some anginal pains for which he is relieved with his nitroglycerin tablets.  He reports to me that that he will have a repeat cardiac catheterization in the new year.  Assessment coronary artery disease with stable angina  Plan he is on appropriate medical therapy with aspirin Plavix as well as max dose metoprolol succinate, max dose Imdur, and Ranexa.  He is also adherent with his atorvastatin 40 mg and his LDL is excellent.  I discussed with him that I think his cardiologist is doing a very good job with his care.  I would like to make sure his anemia is well treated as this could be contributing.

## 2018-09-21 NOTE — Progress Notes (Signed)
7.3

## 2018-09-21 NOTE — Patient Instructions (Signed)
I want you to call Dr Celesta Aver office about your repeat Colonoscopy  Let me know if you switch to the diabetic plan.

## 2018-09-22 ENCOUNTER — Telehealth: Payer: Self-pay | Admitting: Internal Medicine

## 2018-09-22 DIAGNOSIS — E875 Hyperkalemia: Secondary | ICD-10-CM

## 2018-09-22 LAB — BMP8+ANION GAP
Anion Gap: 16 mmol/L (ref 10.0–18.0)
BUN/Creatinine Ratio: 8 — ABNORMAL LOW (ref 10–24)
BUN: 10 mg/dL (ref 8–27)
CALCIUM: 9.5 mg/dL (ref 8.6–10.2)
CO2: 19 mmol/L — ABNORMAL LOW (ref 20–29)
CREATININE: 1.21 mg/dL (ref 0.76–1.27)
Chloride: 101 mmol/L (ref 96–106)
GFR, EST AFRICAN AMERICAN: 73 mL/min/{1.73_m2} (ref >59)
GFR, EST NON AFRICAN AMERICAN: 63 mL/min/{1.73_m2} (ref >59)
Glucose: 173 mg/dL — ABNORMAL HIGH (ref 65–99)
Potassium: 5.4 mmol/L — ABNORMAL HIGH (ref 3.5–5.2)
Sodium: 136 mmol/L (ref 134–144)

## 2018-09-22 LAB — CBC WITH DIFFERENTIAL/PLATELET
BASOS: 1 %
Basophils Absolute: 0 10*3/uL (ref 0.0–0.2)
EOS (ABSOLUTE): 0.1 10*3/uL (ref 0.0–0.4)
EOS: 1 %
HEMATOCRIT: 35.8 % — AB (ref 37.5–51.0)
HEMOGLOBIN: 10.8 g/dL — AB (ref 13.0–17.7)
IMMATURE GRANS (ABS): 0 10*3/uL (ref 0.0–0.1)
Immature Granulocytes: 0 %
LYMPHS: 31 %
Lymphocytes Absolute: 1.6 10*3/uL (ref 0.7–3.1)
MCH: 22.9 pg — AB (ref 26.6–33.0)
MCHC: 30.2 g/dL — ABNORMAL LOW (ref 31.5–35.7)
MCV: 76 fL — AB (ref 79–97)
MONOCYTES: 11 %
Monocytes Absolute: 0.6 10*3/uL (ref 0.1–0.9)
NEUTROS ABS: 3 10*3/uL (ref 1.4–7.0)
Neutrophils: 56 %
Platelets: 298 10*3/uL (ref 150–450)
RBC: 4.71 x10E6/uL (ref 4.14–5.80)
RDW: 16.7 % — ABNORMAL HIGH (ref 12.3–15.4)
WBC: 5.3 10*3/uL (ref 3.4–10.8)

## 2018-09-22 LAB — LIPID PANEL
Chol/HDL Ratio: 5.1 ratio — ABNORMAL HIGH (ref 0.0–5.0)
Cholesterol, Total: 112 mg/dL (ref 100–199)
HDL: 22 mg/dL — ABNORMAL LOW (ref >39)
LDL CALC: 29 mg/dL (ref 0–99)
Triglycerides: 306 mg/dL — ABNORMAL HIGH (ref 0–149)
VLDL Cholesterol Cal: 61 mg/dL — ABNORMAL HIGH (ref 5–40)

## 2018-09-22 LAB — IRON AND TIBC
Iron Saturation: 5 % — CL (ref 15–55)
Iron: 18 ug/dL — ABNORMAL LOW (ref 38–169)
Total Iron Binding Capacity: 366 ug/dL (ref 250–450)
UIBC: 348 ug/dL — ABNORMAL HIGH (ref 111–343)

## 2018-09-22 LAB — FERRITIN: Ferritin: 12 ng/mL — ABNORMAL LOW (ref 30–400)

## 2018-09-22 NOTE — Telephone Encounter (Signed)
Called for lab results, potassium slightly elevated and bicarb slightly low. Remains with iron def anemia. Will resume iron.  I want him to come in Monday for repeat labs for his potassium.  He reported he should be able to come in at 2pm on Monday.

## 2018-09-25 ENCOUNTER — Other Ambulatory Visit (INDEPENDENT_AMBULATORY_CARE_PROVIDER_SITE_OTHER): Payer: PPO

## 2018-09-25 DIAGNOSIS — E11319 Type 2 diabetes mellitus with unspecified diabetic retinopathy without macular edema: Secondary | ICD-10-CM | POA: Diagnosis not present

## 2018-09-25 DIAGNOSIS — E875 Hyperkalemia: Secondary | ICD-10-CM

## 2018-09-25 DIAGNOSIS — D509 Iron deficiency anemia, unspecified: Secondary | ICD-10-CM

## 2018-09-25 DIAGNOSIS — I10 Essential (primary) hypertension: Secondary | ICD-10-CM

## 2018-09-26 LAB — BMP8+ANION GAP
Anion Gap: 16 mmol/L (ref 10.0–18.0)
BUN/Creatinine Ratio: 9 — ABNORMAL LOW (ref 10–24)
BUN: 10 mg/dL (ref 8–27)
CHLORIDE: 98 mmol/L (ref 96–106)
CO2: 21 mmol/L (ref 20–29)
Calcium: 9.5 mg/dL (ref 8.6–10.2)
Creatinine, Ser: 1.17 mg/dL (ref 0.76–1.27)
GFR calc Af Amer: 76 mL/min/{1.73_m2} (ref >59)
GFR calc non Af Amer: 66 mL/min/{1.73_m2} (ref >59)
GLUCOSE: 123 mg/dL — AB (ref 65–99)
Potassium: 4.1 mmol/L (ref 3.5–5.2)
Sodium: 135 mmol/L (ref 134–144)

## 2018-10-03 MED FILL — JANUVIA 100 MG TABLET: 100 | 30 days supply | Qty: 30 | Fill #11

## 2018-10-10 MED FILL — METOPROLOL SUCCINATE ER 200: 200 | 30 days supply | Qty: 30 | Fill #10

## 2018-10-30 ENCOUNTER — Other Ambulatory Visit: Payer: Self-pay | Admitting: Cardiovascular Disease

## 2018-10-30 MED FILL — LISINOPRIL 10 MG TABS: 10 | 90 days supply | Qty: 90 | Fill #0

## 2018-11-01 ENCOUNTER — Other Ambulatory Visit: Payer: Self-pay | Admitting: *Deleted

## 2018-11-02 MED ORDER — SITAGLIPTIN PHOSPHATE 100 MG PO TABS: 100.0000 mg | ORAL_TABLET | Freq: Every day | ORAL | 3 refills | Status: DC

## 2018-11-02 MED FILL — JANUVIA 100 MG TABLET: 100 | 90 days supply | Qty: 90 | Fill #0

## 2018-11-06 ENCOUNTER — Telehealth: Payer: Self-pay | Admitting: *Deleted

## 2018-11-06 ENCOUNTER — Other Ambulatory Visit: Payer: Self-pay

## 2018-11-06 MED ORDER — ATORVASTATIN CALCIUM 40 MG PO TABS: 40.0000 mg | ORAL_TABLET | Freq: Every evening | ORAL | 3 refills | Status: DC

## 2018-11-06 NOTE — Telephone Encounter (Signed)
Patient needs 6 month follow up end of February with Arnold Long DNP Left message to call back

## 2018-11-06 NOTE — Telephone Encounter (Signed)
Patient returned call and he is scheduled for 11/27/18 with [facility] DNP

## 2018-11-06 NOTE — Telephone Encounter (Signed)
atorvastatin (LIPITOR) 40 MG tablet(Expired   REFILL REQUEST @ [facility] ON [address].

## 2018-11-10 MED FILL — METOPROLOL SUCCINATE ER 200: 200 | 30 days supply | Qty: 30 | Fill #11

## 2018-11-26 NOTE — Progress Notes (Signed)
Cardiology Office Note   Date:  11/27/2018   ID:  Jason Velasquez, DOB Jan 20, 1956, MRN [id number]  PCP:  [name], DO  Cardiologist: Oval Linsey  Chief Complaint  Patient presents with  . Coronary Artery Disease  . PAD     History of Present Illness: Jason Velasquez is a 63 y.o. male presents for follow up with a lengthy and complicated cardiac history;  hx of CABG in 2007, ( LIMA-->LAD, SVG-->PDA, SVG-->OM1), multiple PCIs , chronic stable angina,  Follow up cardiac cath on 06/01/2017 found to have progression of the distal LM/LAD/LCx lesion.  He underwent DES placement in the ostial LCx, mid LCx, and ostial LAD.  The ostial LAD was treated with a cutting ballpon reducing the stenosis from 85% to 30%.  It was recommended that he continue clopidogrel and aspirin indefinitely.Other history includes hypertension, diabetes and CVA.  Additionally, he also has a history of PAD, with ABI testing in 2016 which revealed severe reduced atherosclerosis form the proximal abdominal aorta through the common and external iliac arteries bilaterally. He was seen by Dr. Gwenlyn Found who recommended medical management. Carotid artery disease with 40-59% stenosis on the right and 1-39% per ultrasound on 12/09/15.   Due to progressive symptoms of chest pain he was planned for cardiac cath but was found to be significantly anemic. He had blood transfusions, and subsequent EDG. This revealed a hiatal hernia and 2 AVM that were treated with APC. He had cardiac cath on 01/25/2018 which again revealed an ostial LAD stenosis of 99%, treated with a Proctor stent.   He was last seen in the office on 05/23/2018 and was without further complaints. He continued to have intermittent claudication symptoms.   He comes today with complaints of occasional fleeting chest discomfort when he lays down. He states he has not had to take NTG as the pain subsides with tylenol. He has also stopped taking PPI has he feels that it was not helpful.    He wants to begin a low level exercise program at [employer]. He would like to start to walk on treadmill for about 10 minutes and work his way up.   Past Medical History:  Diagnosis Date  . Angina, class III ([place]) 08/24/2015  . Arthritis    "hands" (01/25/2018)  . AVM (arteriovenous malformation) 08/15/2017  . CAD (coronary artery disease) of artery bypass graft    CABG- 2007   . Carotid stenosis 03/03/2016   40-34% RICA, 7-42% LICA, >59% RECA stenosis Repeat 12/2016.  . Claudication ([place])   . Controlled type 2 diabetes mellitus without complication ([place]) 02/06/3874   Dx March of 2016. GAD65 negative Anti Islet cell negative    . DKA (diabetic ketoacidoses) ([place]) 12/07/2014  . Dyspnea   . History of blood transfusion ~ 08/2017   "related to LGIB"  . History of hiatal hernia   . Hx of adenomatous colonic polyps 05/27/2015  . Hypercholesterolemia   . Hypertension   . Hypertensive heart disease 03/03/2016  . Left nephrolithiasis 12/07/2014  . Migraine    "only in my 30's" (01/25/2018)  . Non-ST elevation (NSTEMI) myocardial infarction ([place])   . Pneumonia ~ 1990  . Stroke [facility]) 07/12/2014   "I didn't realize I'd had one; hospital found it"; denies residual on 01/25/2018  . Tubular adenoma of colon 05/31/2015   Colonoscopy 05/20/15 with [name]: 3 tubullar adenoma's largest 2.3cm.  Plan for repeat Colonoscopy in 2019.    Past Surgical History:  Procedure Laterality Date  .  CARDIAC CATHETERIZATION N/A 08/26/2015   Procedure: Left Heart Cath and Cors/Grafts Angiography;  Surgeon: [name], MD;  Location: [place] [facility];  Service: Cardiovascular;  Laterality: N/A;  . CORONARY ARTERY BYPASS GRAFT  2007   "CABG X3", at [facility]   . CORONARY BALLOON ANGIOPLASTY N/A 05/25/2017   Procedure: CORONARY BALLOON ANGIOPLASTY;  Surgeon: [name], MD;  Location: [place] [facility];  Service: Cardiovascular;  Laterality: N/A;  . CORONARY BALLOON ANGIOPLASTY N/A  01/25/2018   Procedure: CORONARY BALLOON ANGIOPLASTY;  Surgeon: [name], MD;  Location: [place] [facility];  Service: Cardiovascular;  Laterality: N/A;  . CORONARY STENT INTERVENTION N/A 05/25/2017   Procedure: CORONARY STENT INTERVENTION;  Surgeon: [name], MD;  Location: [place] [facility];  Service: Cardiovascular;  Laterality: N/A;  . CORONARY STENT INTERVENTION N/A 01/25/2018   Procedure: CORONARY STENT INTERVENTION;  Surgeon: [name], MD;  Location: [place] [facility];  Service: Cardiovascular;  Laterality: N/A;  . ESOPHAGOGASTRODUODENOSCOPY N/A 08/12/2017   Procedure: ESOPHAGOGASTRODUODENOSCOPY (EGD);  Surgeon: Jerene Bears, MD;  Location[facility] END[facility]COPY;  Service: Gastroenterology;  Laterality: N/A;  . LEFT HEART CATH AND CORS/GRAFTS ANGIOGRAPHY N/A 05/23/2017   Procedure: LEFT HEART CATH AND CORS/GRAFTS ANGIOGRAPHY;  Surgeon: [name], MD;  Location: [place] [facility];  Service: Cardiovascular;  Laterality: N/A;  . LEFT HEART CATH AND CORS/GRAFTS ANGIOGRAPHY N/A 08/15/2017   Procedure: LEFT HEART CATH AND CORS/GRAFTS ANGIOGRAPHY;  Surgeon: Burnell Blanks, MD;  Location: [place] [facility];  Service: Cardiovascular;  Laterality: N/A;  . LEFT HEART CATH AND CORS/GRAFTS ANGIOGRAPHY N/A 01/25/2018   Procedure: LEFT HEART CATH AND CORS/GRAFTS ANGIOGRAPHY;  Surgeon: [name], MD;  Location: [place] [facility];  Service: Cardiovascular;  Laterality: N/A;     Current Outpatient Medications  Medication Sig Dispense Refill  . acetaminophen (TYLENOL) 500 MG tablet Take 1,000 mg by mouth every 6 (six) hours as needed for moderate pain or headache.    Marland Kitchen aspirin EC 81 MG tablet Take 81 mg by mouth daily.    Marland Kitchen atorvastatin (LIPITOR) 40 MG tablet Take 1 tablet (40 mg total) by mouth every evening. 90 tablet 3  . clopidogrel (PLAVIX) 75 MG tablet Take 1 tablet (75 mg total) by mouth daily with breakfast. 90 tablet 3  . ferrous sulfate 325 (65 FE) MG tablet  Take 1 tablet (325 mg total) by mouth every other day. 100 tablet 1  . Hypromellose (ARTIFICIAL TEARS OP) Place 1 drop into both eyes daily as needed (for dry eyes).     . isosorbide mononitrate (IMDUR) 120 MG 24 hr tablet TAKE 1/2 (ONE-HALF) TABLET BY MOUTH IN THE MORNING AND 1 TABLET IN THE EVENING 45 tablet 5  . Lancets MISC 1 Device by Does not apply route 4 (four) times daily -  before meals and at bedtime. 100 each 11  . lisinopril (PRINIVIL,ZESTRIL) 10 MG tablet TAKE 1 TABLET BY MOUTH DAILY. 90 tablet 3  . metFORMIN (GLUCOPHAGE) 1000 MG tablet Take 1 tablet (1,000 mg total) by mouth 2 (two) times daily with a meal. 180 tablet 3  . metoprolol (TOPROL-XL) 200 MG 24 hr tablet Take 200 mg by mouth daily.    . nitroGLYCERIN (NITROSTAT) 0.4 MG SL tablet Place 1 tablet (0.4 mg total) under the tongue every 5 (five) minutes as needed for chest pain. 25 tablet 2  . ranolazine (RANEXA) 1000 MG SR tablet Take 1,000 mg by  mouth 2 (two) times daily.    . sitaGLIPtin (JANUVIA) 100 MG tablet Take 1 tablet (100 mg total) by mouth daily. 90 tablet 3   No current facility-administered medications for this visit.     Allergies:   Patient has no known allergies.    Social History:  The patient  reports that he quit smoking about 9 years ago. His smoking use included cigarettes. He has a 76.00 pack-year smoking history. He has never used smokeless tobacco. He reports current alcohol use. He reports that he does not use drugs.   Family History:  The patient's family history includes Diabetes in his father; Hypertension in his mother and sister.    ROS: All other systems are reviewed and negative. Unless otherwise mentioned in H&P    PHYSICAL EXAM: VS:  BP 124/72   Pulse 78   Ht 5\' 10"  (1.778 m)   Wt 214 lb 9.6 oz (97.3 kg)   SpO2 96%   BMI 30.79 kg/m  , BMI Body mass index is 30.79 kg/m. GEN: Well nourished, well developed, in no acute distress HEENT: normal Neck: no JVD, carotid bruits, or  masses Cardiac: RRR; no murmurs, rubs, or gallops,no edema  Respiratory:  Clear to auscultation bilaterally, normal work of breathing GI: soft, nontender, nondistended, + BS MS: no deformity or atrophy Skin: warm and dry, no rash Neuro:  Strength and sensation are intact Psych: euthymic mood, full affect   EKG:  Not completed this office visit.   Recent Labs: 09/21/2018: Hemoglobin 10.8; Platelets 298 09/25/2018: BUN 10; Creatinine, Ser 1.17; Potassium 4.1; Sodium 135    Lipid Panel    Component Value Date/Time   CHOL 112 09/21/2018 1141   TRIG 306 (H) 09/21/2018 1141   HDL 22 (L) 09/21/2018 1141   CHOLHDL 5.1 (H) 09/21/2018 1141   CHOLHDL 3.6 08/12/2017 0334   VLDL 31 08/12/2017 0334   LDLCALC 29 09/21/2018 1141      Wt Readings from Last 3 Encounters:  11/27/18 214 lb 9.6 oz (97.3 kg)  09/21/18 215 lb (97.5 kg)  05/23/18 214 lb 3.2 oz (97.2 kg)    Other studies Reviewed: [facility] 08/15/17:  Mid RCA lesion is 100% stenosed.  Ost RCA to Prox RCA lesion is 70% stenosed.  Ost 1st Mrg lesion is 60% stenosed.  Previously placed Ost 1st Mrg to 1st Mrg stent (unknown type) is widely patent.  Origin lesion is 100% stenosed.  Origin lesion is 100% stenosed.  Prox Graft lesion is 20% stenosed.  Prox LAD to Mid LAD lesion is 100% stenosed.  Previously placed Ost Cx to Prox Cx stent (unknown type) is widely patent.  Previously placed Ost LM to Dist LM stent (unknown type) is widely patent.  Previously placed Prox Cx to Mid Cx stent (unknown type) is widely patent.  SVG graft was not visualized.  SVG graft was not visualized.  Post intervention, there is a -1% residual stenosis.  LIMA graft was visualized by angiography and is normal in caliber.  The graft exhibits minimal luminal irregularities.  Post intervention, there is a -1% residual stenosis.  Ost LAD to Prox LAD lesion is 99% stenosed.  Post intervention, there is a -1% residual stenosis.  1. Severe  triple vessel CAD s/p CABG with 1/3 patent bypass grafts.  2. The LAD is known to be chronically occluded in the mid segment. The mid and distal LAD fills from the patent LIMA graft. The ostial LAD is jailed by the stent extending from the  left main into the Circumflex. The proximal LAD has a 99% stenosis following by an aneurysmal segment then a total occlusion.  3. The left main stent is patent 4. The ostial/proximal Circumflex stent is patent. The first OM stent is patent. There is a 60% ostial stenosis in the first OM branch just prior to the stent. The SVG to the OM is known to be occluded. The mid Circumflex stent is patent.  5. The native RCA has a long 70% proximal stenosis and 100% mid chronic occlusion. The vein graft the to distal RCA is known to be occluded and was not injected.  6. Normal filling pressures  Recommendations: Continue medical management of CAD. No focal targets for PCI. The proximal LAD stenosis is severe but the majority of this territory is supplied by the LIMA graft and the LAD is jailed by the LM/Circ stent. I suspect that his anemia has contributed to his recent chest pain given his severe, diffuse CAD.    Echo 06/01/17: Study Conclusions  - Procedure narrative: Transthoracic echocardiography. Image quality was poor. Intravenous contrast (Definity) was administered. - Left ventricle: The cavity size was normal. There was mild focal basal hypertrophy of the septum. Systolic function was vigorous. The estimated ejection fraction was in the range of 65% to 70%. Wall motion was normal; there were no regional wall motion abnormalities. Doppler parameters are consistent with abnormal left ventricular relaxation (grade 1 diastolic dysfunction). - Aortic valve: Trileaflet; mildly thickened, mildly calcified leaflets. - Left atrium: The atrium was mildly dilated.   Lexiscan Cardiolite 08/21/15:  Nuclear stress EF: 57%.  The left ventricular  ejection fraction is normal (55-65%).  There was no ST segment deviation noted during stress.  No T wave inversion was noted during stress.  Defect 1: There is a small defect of moderate severity present in the basal inferoseptal and basal inferior location.  Defect 2: There is a defect present in the basal anterior, mid anterior and apical anterior location.  Findings consistent with ischemia.  This is a high risk study.  High risk stress nuclear study with two areas of ischemia and normal left ventricular regional and global systolic function. The inferior area of ischemia is small. The anterior area of ischemia is extensive, but appears to be very mild. The septum is spared.  Carotid Doppler 12/11/15: 24-09% RICA, 7-35% LICA, >32% RECA stenosis  ABI 08/12/15: R 0.81, L 0.89 Diffuse atherosclerosis from the proximal abdominal aorta into both tibial arteries. Several areas of narrowing are noted in both lower extremities, although no focal significant stenosis is identified. One vessel run-off on the right, and three vessel run-off on the left.  ABI 02/03/17: R 0.81, L 0.91 Abnormal lower arterial Doppler study, with waveforms suggestive of inflow disease. The bilateral ABI's are stable and in the mild range. The right great toe pressure is normal. The left great toe pressure is abnormal   ASSESSMENT AND PLAN:  1. CAD: Lengthy and complicated history as outlined above. He is medically compliant and offers no significant complaints of chest pain. He states that he sometimes has fleeting chest pain when he lays down in bed, but not wit exercise or any type of exertion. He has not needed to take NTG.   I will continue him on his current regimen for now, I have advised him to notify us if he has had to take NTG, if his chest discomfort worsens, last longer, occurs with more frequency or requires him to take NTG.  2. Hypertension:  BP is well controlled today. He will not need  any new adjustments at this time.   3. Hypercholesterolemia: He will continue statin therapy. His labs are followed by his PCP. I have spoken to him to make sure he keeps up with his lab values to keep LDL < 70 with CAD. Last LDL on 09/25/2018 was 29.   4. PAD: He continues to have symptoms of intermittent claudication. Pulses are palpable.   Current medicines are reviewed at length with the patient today.    Labs/ tests ordered today include: None  Phill Myron. [place] Pugh, ANP, AACC   11/27/2018 3:03 PM    [facility] [place] [address] Office [phone] Fax [phone]

## 2018-11-27 ENCOUNTER — Encounter (INDEPENDENT_AMBULATORY_CARE_PROVIDER_SITE_OTHER): Payer: Self-pay

## 2018-11-27 ENCOUNTER — Telehealth: Payer: Self-pay | Admitting: *Deleted

## 2018-11-27 ENCOUNTER — Ambulatory Visit (INDEPENDENT_AMBULATORY_CARE_PROVIDER_SITE_OTHER): Payer: PPO | Admitting: Adult Health

## 2018-11-27 ENCOUNTER — Encounter: Payer: Self-pay | Admitting: Adult Health

## 2018-11-27 VITALS — BP 124/72 | HR 78 | Ht 70.0 in | Wt 214.6 lb

## 2018-11-27 DIAGNOSIS — I2510 Atherosclerotic heart disease of native coronary artery without angina pectoris: Secondary | ICD-10-CM | POA: Diagnosis not present

## 2018-11-27 DIAGNOSIS — I10 Essential (primary) hypertension: Secondary | ICD-10-CM

## 2018-11-27 DIAGNOSIS — E7800 Pure hypercholesterolemia, unspecified: Secondary | ICD-10-CM | POA: Diagnosis not present

## 2018-11-27 MED ORDER — NITROGLYCERIN 0.4 MG SL SUBL: 0.4000 mg | SUBLINGUAL_TABLET | SUBLINGUAL | 2 refills | Status: AC | PRN

## 2018-11-27 MED ORDER — RANOLAZINE ER 1000 MG PO TB12: 1000.0000 mg | ORAL_TABLET | Freq: Two times a day (BID) | ORAL | 5 refills | Status: DC

## 2018-11-27 NOTE — Telephone Encounter (Signed)
Patient in office today seeing Arnold Long DNP. He was wanting to know status on Ranexa and patient assistance. Called and advised was denied secondary to Ranexa going generic this year. Spoke with [facility] and is available in generic. New Rx sent and will call tomorrow to follow up

## 2018-11-27 NOTE — Patient Instructions (Signed)
Follow-Up: You will need a follow up appointment in 6 months.  Please call our office 2 months in advance, June 2020, to schedule this, August 2020, appointment.  You may see Skeet Latch, MD, Jory Sims, DNP, AACC  or one of the following Advanced Practice Providers on your designated Care Team:  Kerin Ransom, [place]  Roby Lofts, PA-C  Sande Rives, [place]      Medication Instructions:  NO CHANGES- Your physician recommends that you continue on your current medications as directed. Please refer to the Current Medication list given to you today. If you need a refill on your cardiac medications before your next appointment, please call your pharmacy. Labwork: When you have labs (blood work) and your tests are completely normal, you will receive your results ONLY by [place] (if you have MyChart) -OR- A paper copy in the mail.  At [facility], you and your health needs are our priority.  As part of our continuing mission to provide you with exceptional heart care, we have created designated Provider Care Teams.  These Care Teams include your primary Cardiologist (physician) and Advanced Practice Providers (APPs -  Physician Assistants and Nurse Practitioners) who all work together to provide you with the care you need, when you need it.  Thank you for choosing [facility]!!

## 2018-11-28 NOTE — Telephone Encounter (Signed)
Medication $196.46 with insurance. Good Rx about $53 a month for Ranexa 1000 mg tablets. Patient thinks he could financially  do 1000 mg 1/2 tablet twice a day for 2 months supply Will discuss with Dr Oval Linsey for further recommendations.

## 2018-12-01 NOTE — Telephone Encounter (Signed)
Dr Oval Linsey ok with patient trying Ranexa 1000 mg 1/2 tablet twice a day. Advised patient and he will call back next week with [facility] location to send Rx to

## 2018-12-12 ENCOUNTER — Other Ambulatory Visit: Payer: Self-pay

## 2018-12-12 NOTE — Telephone Encounter (Signed)
metoprolol (TOPROL-XL) 200 MG 24 hr tablet, REFILL REQUEST @  [place], [place] - [address]. [phone] (Phone) [phone] (Fax)

## 2018-12-13 MED ORDER — METOPROLOL SUCCINATE ER 200 MG PO TB24: 200.0000 mg | ORAL_TABLET | Freq: Every day | ORAL | 3 refills | Status: DC

## 2018-12-19 ENCOUNTER — Ambulatory Visit: Payer: PPO | Admitting: Internal Medicine

## 2018-12-19 ENCOUNTER — Other Ambulatory Visit: Payer: Self-pay | Admitting: Cardiovascular Disease

## 2018-12-19 NOTE — Telephone Encounter (Signed)
New Message    *STAT* If patient is at the pharmacy, call can be transferred to refill team.   1. Which medications need to be refilled? (please list name of each medication and dose if known) Renexa 500mg   2. Which pharmacy/location (including street and city if local pharmacy) is medication to be sent to? [place]  3. Do they need a 30 day or 90 day supply? 90 day supply

## 2018-12-21 MED ORDER — RANOLAZINE ER 1000 MG PO TB12: 1000.0000 mg | ORAL_TABLET | Freq: Two times a day (BID) | ORAL | 2 refills | Status: DC

## 2018-12-21 NOTE — Addendum Note (Signed)
Addended by: Alvina Filbert B on: 12/21/2018 06:12 PM   Modules accepted: Orders

## 2018-12-21 NOTE — Telephone Encounter (Signed)
Left message informing patient that rx have been sent over. Attempted to call [facility] to cant current Ranexa rx but something is wrong with their line tried to call 5 times.

## 2018-12-21 NOTE — Telephone Encounter (Signed)
Spoke with patient and he states he was taking Ranexa 500mg . Advised patient to take medication as prescribed and that refill for Ranexa 100mg  bid was sent to his requested pharmacy. He wanted to know if Dr Oval Linsey was ok with him decreasing his medication. I informed patient that I would send her a message and get back to him. He voiced understanding.

## 2018-12-21 NOTE — Telephone Encounter (Signed)
Left message to call back  

## 2018-12-21 NOTE — Telephone Encounter (Signed)
The Ranexa prescriptions should be for 1000mg  bid, not 100mg .  He breaks those in half to help him with the cost of the medication and takes 500mg  bid.  On the bottle, please write 1000mg  bid.

## 2018-12-26 ENCOUNTER — Other Ambulatory Visit: Payer: Self-pay | Admitting: Cardiovascular Disease

## 2018-12-26 NOTE — Telephone Encounter (Signed)
Rx sent to [facility] 

## 2018-12-26 NOTE — Telephone Encounter (Signed)
Spoke with patient and he stated he did not have a copay when he went to get Rx filled  He will try to do 1000 mg 1/2 tablets twice a day

## 2019-01-04 ENCOUNTER — Ambulatory Visit: Payer: PPO | Admitting: Internal Medicine

## 2019-01-29 ENCOUNTER — Encounter: Payer: Self-pay | Admitting: Internal Medicine

## 2019-02-05 MED FILL — LISINOPRIL 10 MG TABLET: 10 | 90 days supply | Qty: 90 | Fill #0

## 2019-02-05 MED FILL — JANUVIA 100 MG TABLET: 100 | 90 days supply | Qty: 90 | Fill #0

## 2019-02-19 ENCOUNTER — Encounter: Payer: Self-pay | Admitting: General Surgery

## 2019-02-20 ENCOUNTER — Other Ambulatory Visit: Payer: Self-pay

## 2019-02-20 ENCOUNTER — Encounter: Payer: Self-pay | Admitting: Internal Medicine

## 2019-02-20 ENCOUNTER — Ambulatory Visit (INDEPENDENT_AMBULATORY_CARE_PROVIDER_SITE_OTHER): Payer: PPO | Admitting: Internal Medicine

## 2019-02-20 VITALS — Ht 70.0 in | Wt 210.0 lb

## 2019-02-20 DIAGNOSIS — K31819 Angiodysplasia of stomach and duodenum without bleeding: Secondary | ICD-10-CM

## 2019-02-20 DIAGNOSIS — D500 Iron deficiency anemia secondary to blood loss (chronic): Secondary | ICD-10-CM | POA: Diagnosis not present

## 2019-02-20 DIAGNOSIS — Z86010 Personal history of colonic polyps: Secondary | ICD-10-CM

## 2019-02-20 NOTE — Progress Notes (Signed)
TELEHEALTH ENCOUNTER IN SETTING OF COVID-19 PANDEMIC - REQUESTED BY PATIENT SERVICE PROVIDED BY TELEMEDECINE - TYPE: Telephone PATIENT LOCATION: Home  PATIENT HAS CONSENTED TO TELEHEALTH VISIT PROVIDER LOCATION: OFFICE REFERRING PROVIDER:Hoffman, Rachel Moulds, DO  PARTICIPANTS OTHER THAN PATIENT NONE TIME SPENT ON CALL: 12 minutes    Jason Velasquez 63 y.o. January 11, 1956 [id number]  Assessment & Plan:  Iron deficiency anemia due to chronic blood loss suspect from AVMs on Plavix I am suspicious he was never adequately repleted with iron.  The other possibility is he is continuing to bleed from other AVMs.  I plan to recheck his ferritin and CBC tomorrow and then if he is improving on oral iron probably have him do a colonoscopy only.  If he has not had a good response to iron therapy, I am more inclined to repeat an EGD as well in that case we would have to do his procedures at the hospital because that is where I have argon plasma coagulation available to treat AVMs.  He understands and agrees with this plan.The risks and benefits as well as alternatives of endoscopic procedure(s) have been discussed and reviewed. All questions answered. The patient agrees to proceed. He could also need repeat parenteral iron or Feraheme.  Angiodysplasia of stomach and duodenum Question if these are recurrent await iron studies.  EGD with possible ablation would be appropriate if he is not improving or resolved with respect to the anemia, on iron therapy.  Hx of adenomatous colonic polyps Repeat colonoscopy is a little bit overdue.  We will schedule this after I review the iron studies.  He will need to hold his clopidogrel for 5 days.  We will clarify with cardiology.  I appreciate the opportunity to care for this patient. CC: Jason Groves, DO     Subjective:   Chief Complaint: Iron deficiency anemia and history of colon polyps  HPI The patient is a 63 year old white man, known to me from prior  colonoscopy in 2016 with 3 adenomas who was seen in November 2018 by Dr. Hilarie Fredrickson when he was hospitalized and an EGD showed gastric and duodenal AVMs that were ablated.  He had 1 infusion of Feraheme at that time.  He says he was not treated with iron by mouth until December 2019 showed him to be iron deficient and anemic still.  He is now on ferrous sulfate 325 mg every other day.  He remains on Plavix and aspirin for coronary artery disease and peripheral vascular disease.  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 He denies any chest pain or dyspnea with exertion.  He says he feels okay.  He says sometimes intermittently rarely when he lies down at night "my chest goes to paining" but it goes right away when he sits up.  This is not common.  It does not seem to be related to eating or reflux.  However in April 2019 he had another coronary intervention with stenting with a drug-eluting stent because of angina, but at the time his hemoglobin was not normal it was 9.7.  The highest it is been was 11.3 immediately post discharge after his gastric and duodenal AVMs were ablated and he had received 1 treatment with Feraheme.  Colonoscopy 05/2015 - screening 3 adenomas max 20 mm EGD 2018 - Fe defic anemia - 2 gastric 1 duodenal AVM ablated   Lab Results  Component Value Date   WBC 5.3 09/21/2018   HGB 10.8 (L)  09/21/2018   HCT 35.8 (L) 09/21/2018   MCV 76 (L) 09/21/2018   PLT 298 09/21/2018   Lab Results  Component Value Date   FERRITIN 12 (L) 09/21/2018     No Known Allergies Current Meds  Medication Sig  . acetaminophen (TYLENOL) 500 MG tablet Take 1,000 mg by mouth every 6 (six) hours as needed for moderate pain or headache.  Marland Kitchen aspirin EC 81 MG tablet Take 81 mg by mouth daily.  Marland Kitchen atorvastatin (LIPITOR) 40 MG tablet Take 1 tablet (40 mg total) by mouth every evening.  . clopidogrel (PLAVIX) 75 MG tablet Take 1 tablet (75 mg total) by mouth daily with  breakfast.  . ferrous sulfate 325 (65 FE) MG tablet Take 1 tablet (325 mg total) by mouth every other day.  . Hypromellose (ARTIFICIAL TEARS OP) Place 1 drop into both eyes daily as needed (for dry eyes).   . Insulin Aspart Prot & Aspart (NOVOLOG MIX 70/30 [place]) Inject 10 Units into the skin as needed.  . isosorbide mononitrate (IMDUR) 120 MG 24 hr tablet TAKE ONE-HALF TABLET BY MOUTH IN THE MORNING AND 1 TABLET BY MOUTH IN THE EVENING.  . Lancets MISC 1 Device by Does not apply route 4 (four) times daily -  before meals and at bedtime.  Marland Kitchen lisinopril (PRINIVIL,ZESTRIL) 10 MG tablet TAKE 1 TABLET BY MOUTH DAILY.  . metFORMIN (GLUCOPHAGE) 1000 MG tablet Take 1 tablet (1,000 mg total) by mouth 2 (two) times daily with a meal.  . metoprolol (TOPROL-XL) 200 MG 24 hr tablet Take 1 tablet (200 mg total) by mouth daily.  . nitroGLYCERIN (NITROSTAT) 0.4 MG SL tablet Place 1 tablet (0.4 mg total) under the tongue every 5 (five) minutes as needed for chest pain.  . ranolazine (RANEXA) 1000 MG SR tablet Take 1,000 mg by mouth as directed. TAKE 1/2 TABLET BY MOUTH TWICE A DAY  . sitaGLIPtin (JANUVIA) 100 MG tablet Take 1 tablet (100 mg total) by mouth daily.   Past Medical History:  Diagnosis Date  . Angina, class III ([place]) 08/24/2015  . Arthritis    "hands" (01/25/2018)  . AVM (arteriovenous malformation) 08/15/2017  . CAD (coronary artery disease) of artery bypass graft    CABG- 2007   . Carotid stenosis 03/03/2016   01-74% RICA, 9-44% LICA, >96% RECA stenosis Repeat 12/2016.  . Claudication ([place])   . Controlled type 2 diabetes mellitus without complication ([place]) 04/07/9162   Dx March of 2016. GAD65 negative Anti Islet cell negative    . DKA (diabetic ketoacidoses) ([place]) 12/07/2014  . Dyspnea   . History of blood transfusion ~ 08/2017   "related to LGIB"  . History of hiatal hernia   . Hx of adenomatous colonic polyps 05/27/2015  . Hypercholesterolemia   . Hypertension   . Hypertensive heart disease  03/03/2016  . 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 Left nephrolithiasis 12/07/2014  . Migraine    "only in my 30's" (01/25/2018)  . Non-ST elevation (NSTEMI) myocardial infarction ([place])   . Pneumonia ~ 1990  . Stroke [facility]) 07/12/2014   "I didn't realize I'd had one; hospital found it"; denies residual on 01/25/2018  . Tubular adenoma of colon 05/31/2015   Colonoscopy 05/20/15 with [name]: 3 tubullar adenoma's largest 2.3cm.  Plan for repeat Colonoscopy in 2019.   Past Surgical History:  Procedure Laterality Date  . CARDIAC CATHETERIZATION N/A 08/26/2015   Procedure: Left Heart Cath and Cors/Grafts  Angiography;  Surgeon: [name], MD;  Location: [place] [facility];  Service: Cardiovascular;  Laterality: N/A;  . CORONARY ARTERY BYPASS GRAFT  2007   "CABG X3", at [facility]   . CORONARY BALLOON ANGIOPLASTY N/A 05/25/2017   Procedure: CORONARY BALLOON ANGIOPLASTY;  Surgeon: [name], MD;  Location: [place] [facility];  Service: Cardiovascular;  Laterality: N/A;  . CORONARY BALLOON ANGIOPLASTY N/A 01/25/2018   Procedure: CORONARY BALLOON ANGIOPLASTY;  Surgeon: [name], MD;  Location: [place] [facility];  Service: Cardiovascular;  Laterality: N/A;  . CORONARY STENT INTERVENTION N/A 05/25/2017   Procedure: CORONARY STENT INTERVENTION;  Surgeon: [name], MD;  Location: [place] [facility];  Service: Cardiovascular;  Laterality: N/A;  . CORONARY STENT INTERVENTION N/A 01/25/2018   Procedure: CORONARY STENT INTERVENTION;  Surgeon: [name], MD;  Location: [place] [facility];  Service: Cardiovascular;  Laterality: N/A;  . ESOPHAGOGASTRODUODENOSCOPY N/A 08/12/2017   Procedure: ESOPHAGOGASTRODUODENOSCOPY (EGD);  Surgeon: Jerene Bears, MD;  Location[facility];  Service: Gastroenterology;  Laterality: N/A;  . LEFT HEART CATH AND CORS/GRAFTS ANGIOGRAPHY N/A 05/23/2017   Procedure: LEFT HEART CATH AND  CORS/GRAFTS ANGIOGRAPHY;  Surgeon: [name], MD;  Location: [place] [facility];  Service: Cardiovascular;  Laterality: N/A;  . LEFT HEART CATH AND CORS/GRAFTS ANGIOGRAPHY N/A 08/15/2017   Procedure: LEFT HEART CATH AND CORS/GRAFTS ANGIOGRAPHY;  Surgeon: Burnell Blanks, MD;  Location: [place] [facility];  Service: Cardiovascular;  Laterality: N/A;  . LEFT HEART CATH AND CORS/GRAFTS ANGIOGRAPHY N/A 01/25/2018   Procedure: LEFT HEART CATH AND CORS/GRAFTS ANGIOGRAPHY;  Surgeon: [name], MD;  Location: [place] [facility];  Service: Cardiovascular;  Laterality: N/A;   Social History   Social History Narrative   Divorced   2 children, 1 [place] 1 [place]   Lives with daughter's mother (friend)   Former smoker rare EtOH no drugs   Epworth Sleepiness Scale = 7 (as of 07/28/2015)   family history includes Diabetes in his father; Hypertension in his mother and sister.   Review of Systems As per HPI

## 2019-02-20 NOTE — Patient Instructions (Signed)
It was good to speak to you today.  As I explained I need to see what your iron levels and blood counts are again so you will come to the lab tomorrow to have that done and then I will contact you with plans.  You could need a repeat upper endoscopy and colonoscopy or just a repeat colonoscopy.  I will decide my recommendations pending review of these labs.  We will need to hold your clopidogrel 5 days prior to your colonoscopy and possible upper GI endoscopy and will clarify that with Dr. Oval Linsey, your cardiologist.  I appreciate the opportunity to care for you. Gatha Mayer, MD, [facility]

## 2019-02-20 NOTE — Assessment & Plan Note (Signed)
Repeat colonoscopy is a little bit overdue.  We will schedule this after I review the iron studies.  He will need to hold his clopidogrel for 5 days.  We will clarify with cardiology.

## 2019-02-20 NOTE — Assessment & Plan Note (Signed)
Question if these are recurrent await iron studies.  EGD with possible ablation would be appropriate if he is not improving or resolved with respect to the anemia, on iron therapy.

## 2019-02-20 NOTE — Assessment & Plan Note (Signed)
I am suspicious he was never adequately repleted with iron.  The other possibility is he is continuing to bleed from other AVMs.  I plan to recheck his ferritin and CBC tomorrow and then if he is improving on oral iron probably have him do a colonoscopy only.  If he has not had a good response to iron therapy, I am more inclined to repeat an EGD as well in that case we would have to do his procedures at the hospital because that is where I have argon plasma coagulation available to treat AVMs.  He understands and agrees with this plan.The risks and benefits as well as alternatives of endoscopic procedure(s) have been discussed and reviewed. All questions answered. The patient agrees to proceed. He could also need repeat parenteral iron or Feraheme.

## 2019-02-21 ENCOUNTER — Other Ambulatory Visit (INDEPENDENT_AMBULATORY_CARE_PROVIDER_SITE_OTHER): Payer: PPO

## 2019-02-21 DIAGNOSIS — D500 Iron deficiency anemia secondary to blood loss (chronic): Secondary | ICD-10-CM

## 2019-02-21 LAB — FERRITIN: Ferritin: 14.7 ng/mL — ABNORMAL LOW (ref 22.0–322.0)

## 2019-02-21 LAB — CBC WITH DIFFERENTIAL/PLATELET
Basophils Absolute: 0.1 10*3/uL (ref 0.0–0.1)
Basophils Relative: 1.8 % (ref 0.0–3.0)
Eosinophils Absolute: 0.1 10*3/uL (ref 0.0–0.7)
Eosinophils Relative: 1.2 % (ref 0.0–5.0)
HCT: 37.2 % — ABNORMAL LOW (ref 39.0–52.0)
Hemoglobin: 12 g/dL — ABNORMAL LOW (ref 13.0–17.0)
Lymphocytes Relative: 46.7 % — ABNORMAL HIGH (ref 12.0–46.0)
Lymphs Abs: 2.2 10*3/uL (ref 0.7–4.0)
MCHC: 32.3 g/dL (ref 30.0–36.0)
MCV: 77.8 fl — ABNORMAL LOW (ref 78.0–100.0)
Monocytes Absolute: 0.6 10*3/uL (ref 0.1–1.0)
Monocytes Relative: 13.1 % — ABNORMAL HIGH (ref 3.0–12.0)
Neutro Abs: 1.7 10*3/uL (ref 1.4–7.7)
Neutrophils Relative %: 37.2 % — ABNORMAL LOW (ref 43.0–77.0)
Platelets: 237 10*3/uL (ref 150.0–400.0)
RBC: 4.78 Mil/uL (ref 4.22–5.81)
RDW: 18.8 % — ABNORMAL HIGH (ref 11.5–15.5)
WBC: 4.6 10*3/uL (ref 4.0–10.5)

## 2019-02-21 NOTE — Progress Notes (Signed)
Hgb getting better on iron tx  Set up colonoscopy hx polyps and iron def anemia  - [facility]  Needs ok from cardiology to hold Plavix x 5 days

## 2019-02-22 ENCOUNTER — Telehealth: Payer: Self-pay

## 2019-02-22 DIAGNOSIS — D500 Iron deficiency anemia secondary to blood loss (chronic): Secondary | ICD-10-CM

## 2019-02-22 DIAGNOSIS — Z86010 Personal history of colonic polyps: Secondary | ICD-10-CM

## 2019-02-22 NOTE — Telephone Encounter (Signed)
Patient has been set up for a colonoscopy for 03/19/2019 and we will get plavix clearance. Instructions mailed to patient, confirmed address.

## 2019-02-22 NOTE — Telephone Encounter (Signed)
[facility] Pre-operative Risk Assessment     Request for surgical clearance:     Endoscopy Procedure  What type of surgery is being performed?     colonoscopy  When is this surgery scheduled?     03/19/2019  What type of clearance is required ?   Pharmacy  Are there any medications that need to be held prior to surgery and how long? Plavix, 5 days  Practice name and name of physician performing surgery?      [facility]  What is your office phone and fax number?      Phone- [phone]  [phone]  Anesthesia type (None, local, MAC, general) ?       MAC

## 2019-02-23 NOTE — Telephone Encounter (Signed)
Patient notified of recommendations to hold Plavix 5 days prior to Colonoscopy per Cardiologist. Patient verbalized understanding.

## 2019-02-23 NOTE — Telephone Encounter (Signed)
   Primary Cardiologist: Skeet Latch, MD  Chart reviewed as part of pre-operative protocol coverage.   Per Dr. Oval Linsey, patient can hold plavix x5 days prior to his upcoming colonoscopy. He should restart his plavix when cleared to do so by his gastroenterologist.   I will route this recommendation to the requesting party via [place] fax function and remove from pre-op pool.  Please call with questions.  Abigail Butts, PA-C 02/23/2019, 12:13 PM

## 2019-02-23 NOTE — Telephone Encounter (Signed)
OK to hold Plavix for colonoscopy.  

## 2019-03-06 ENCOUNTER — Encounter: Payer: Self-pay | Admitting: Internal Medicine

## 2019-03-09 ENCOUNTER — Telehealth: Payer: Self-pay

## 2019-03-09 DIAGNOSIS — IMO0001 Reserved for inherently not codable concepts without codable children: Secondary | ICD-10-CM

## 2019-03-09 MED ORDER — EMPAGLIFLOZIN-METFORMIN HCL 5-1000 MG PO TABS: 1.0000 | ORAL_TABLET | Freq: Two times a day (BID) | ORAL | 11 refills | Status: DC

## 2019-03-09 NOTE — Telephone Encounter (Signed)
Thank you jennifer

## 2019-03-09 NOTE — Telephone Encounter (Signed)
Called patient to see if he would be interested in switching his metformin to Synjardy (empagliflozin-metformin) to provide cardiac protection b/c he has clinical ASCVD. Patient said he would be OK with switch as long as we run it by his PCP, Dr. Heber [place] (previously approved addition of GLP1 but after consideration, SGLT2 would be more convenient for pt). Will do a price check on Synjardy if PCP approves and schedule lab follow up in 2-4 weeks.

## 2019-03-09 NOTE — Telephone Encounter (Signed)
Dr. Heber Ord approved switch from metformin to [place]. Medication is $9 at [facility], which is fine with patient. Informed patient to d/c metformin and start Synjardy 1 tablet BID once he picks it up. Will call in about a week to see how he is tolerating new medication and to schedule a lab visit for 2-4 weeks from start of new medication for BMP.

## 2019-03-16 ENCOUNTER — Telehealth: Payer: Self-pay

## 2019-03-16 NOTE — Telephone Encounter (Signed)
Covid-19 screening questions   Do you now or have you had a fever in the last 14 days? No  Do you have any respiratory symptoms of shortness of breath or cough now or in the last 14 days? No  Do you have any family members or close contacts with diagnosed or suspected Covid-19 in the past 14 days? No  Have you been tested for Covid-19 and found to be positive? No        

## 2019-03-16 NOTE — Telephone Encounter (Signed)
Called to see how patient is doing on [place]. He has not yet picked it up from the pharmacy, but says he will pick it up today. He has a colonoscopy on Monday, so will start the medication after that. I will call him back late next week to see how he is doing on Synjardy and schedule labs for 2 weeks.

## 2019-03-19 ENCOUNTER — Ambulatory Visit (AMBULATORY_SURGERY_CENTER): Payer: PPO | Admitting: Internal Medicine

## 2019-03-19 ENCOUNTER — Encounter: Payer: Self-pay | Admitting: Internal Medicine

## 2019-03-19 ENCOUNTER — Other Ambulatory Visit: Payer: Self-pay

## 2019-03-19 VITALS — BP 109/64 | HR 62 | Temp 97.7°F | Resp 14 | Ht 70.0 in | Wt 210.0 lb

## 2019-03-19 DIAGNOSIS — K5730 Diverticulosis of large intestine without perforation or abscess without bleeding: Secondary | ICD-10-CM | POA: Diagnosis not present

## 2019-03-19 DIAGNOSIS — D123 Benign neoplasm of transverse colon: Secondary | ICD-10-CM | POA: Diagnosis not present

## 2019-03-19 DIAGNOSIS — D120 Benign neoplasm of cecum: Secondary | ICD-10-CM | POA: Diagnosis not present

## 2019-03-19 DIAGNOSIS — Z86010 Personal history of colonic polyps: Secondary | ICD-10-CM

## 2019-03-19 DIAGNOSIS — D500 Iron deficiency anemia secondary to blood loss (chronic): Secondary | ICD-10-CM

## 2019-03-19 MED ORDER — SODIUM CHLORIDE 0.9 % IV SOLN: 500.0000 mL | Freq: Once | INTRAVENOUS | Status: DC

## 2019-03-19 NOTE — Progress Notes (Signed)
Called to room to assist during endoscopic procedure.  Patient ID and intended procedure confirmed with present staff. Received instructions for my participation in the procedure from the performing physician.  

## 2019-03-19 NOTE — Progress Notes (Signed)
Pt's states no medical or surgical changes since previsit or office visit.  Vitals per [facility] Mote CMA and temps per [facility] CMA.

## 2019-03-19 NOTE — Patient Instructions (Addendum)
I found and removed just two very tiny (1-54mm_ polyps.  I will let you know pathology results and when to have another routine colonoscopy by mail and/or My Chart.  We need to recheck your blood count and iron level at the beginning of August. Please put down in your calendar to come to the lab in this building the week of August 3 and get blood work  Stay on the ferrous sulfate tablets to build up the iron.  Restart Plavix (clopidogrel) today.  I appreciate the opportunity to care for you. Gatha Mayer, MD, [facility]  Please read handouts provided. Continue present medications. Await pathology results.      YOU HAD AN ENDOSCOPIC PROCEDURE TODAY AT [facility]:   Refer to the procedure report that was given to you for any specific questions about what was found during the examination.  If the procedure report does not answer your questions, please call your gastroenterologist to clarify.  If you requested that your care partner not be given the details of your procedure findings, then the procedure report has been included in a sealed envelope for you to review at your convenience later.  YOU SHOULD EXPECT: Some feelings of bloating in the abdomen. Passage of more gas than usual.  Walking can help get rid of the air that was put into your GI tract during the procedure and reduce the bloating. If you had a lower endoscopy (such as a colonoscopy or flexible sigmoidoscopy) you may notice spotting of blood in your stool or on the toilet paper. If you underwent a bowel prep for your procedure, you may not have a normal bowel movement for a few days.  Please Note:  You might notice some irritation and congestion in your nose or some drainage.  This is from the oxygen used during your procedure.  There is no need for concern and it should clear up in a day or so.  SYMPTOMS TO REPORT IMMEDIATELY:   Following lower endoscopy (colonoscopy or flexible sigmoidoscopy):  Excessive amounts of blood in the stool  Significant tenderness or worsening of abdominal pains  Swelling of the abdomen that is new, acute  Fever of 100F or higher    For urgent or emergent issues, a gastroenterologist can be reached at any hour by calling [phone].   DIET:  We do recommend a small meal at first, but then you may proceed to your regular diet.  Drink plenty of fluids but you should avoid alcoholic beverages for 24 hours.  ACTIVITY:  You should plan to take it easy for the rest of today and you should NOT DRIVE or use heavy machinery until tomorrow (because of the sedation medicines used during the test).    FOLLOW UP: Our staff will call the number listed on your records 48-72 hours following your procedure to check on you and address any questions or concerns that you may have regarding the information given to you following your procedure. If we do not reach you, we will leave a message.  We will attempt to reach you two times.  During this call, we will ask if you have developed any symptoms of COVID 19. If you develop any symptoms (ie: fever, flu-like symptoms, shortness of breath, cough etc.) before then, please call [phone].  If you test positive for Covid 19 in the 2 weeks post procedure, please call and report this information to [place].    If any biopsies were taken you will  be contacted by phone or by letter within the next 1-3 weeks.  Please call us at [phone] if you have not heard about the biopsies in 3 weeks.    SIGNATURES/CONFIDENTIALITY: You and/or your care partner have signed paperwork which will be entered into your electronic medical record.  These signatures attest to the fact that that the information above on your After Visit Summary has been reviewed and is understood.  Full responsibility of the confidentiality of this discharge information lies with you and/or your care-partner.

## 2019-03-19 NOTE — Op Note (Signed)
[facility] Patient Name: Jason Velasquez Procedure Date: 03/19/2019 1:42 PM MRN: [id number] Endoscopist: Gatha Mayer , MD Age: 63 Referring MD:  Date of Birth: April 28, 1956 Gender: Male Account #: [id number] Procedure:                Colonoscopy Indications:              Iron deficiency anemia secondary to chronic blood                            loss Medicines:                Propofol per Anesthesia, Monitored Anesthesia Care Procedure:                Pre-Anesthesia Assessment:                           - Prior to the procedure, a History and Physical                            was performed, and patient medications and                            allergies were reviewed. The patient's tolerance of                            previous anesthesia was also reviewed. The risks                            and benefits of the procedure and the sedation                            options and risks were discussed with the patient.                            All questions were answered, and informed consent                            was obtained. Prior Anticoagulants: The patient                            last took Plavix (clopidogrel) 5 days prior to the                            procedure. ASA Grade Assessment: III - A patient                            with severe systemic disease. After reviewing the                            risks and benefits, the patient was deemed in                            satisfactory condition to undergo the procedure.  After obtaining informed consent, the colonoscope                            was passed under direct vision. Throughout the                            procedure, the patient's blood pressure, pulse, and                            oxygen saturations were monitored continuously. The                            Colonoscope was introduced through the anus and                            advanced to the the cecum,  identified by                            appendiceal orifice and ileocecal valve. The                            colonoscopy was performed without difficulty. The                            patient tolerated the procedure well. The quality                            of the bowel preparation was good. The ileocecal                            valve, appendiceal orifice, and rectum were                            photographed. Scope In: 1:53:23 PM Scope Out: 2:07:23 PM Scope Withdrawal Time: 0 hours 11 minutes 1 second  Total Procedure Duration: 0 hours 14 minutes 0 seconds  Findings:                 The perianal and digital rectal examinations were                            normal. Pertinent negatives include normal prostate                            (size, shape, and consistency).                           Two sessile polyps were found in the transverse                            colon and cecum. The polyps were 1 to 2 mm in size.                            These polyps were removed with a cold biopsy  forceps. Resection and retrieval were complete.                            Verification of patient identification for the                            specimen was done. Estimated blood loss was minimal.                           Multiple diverticula were found in the sigmoid                            colon.                           The exam was otherwise without abnormality on                            direct and retroflexion views. Complications:            No immediate complications. Estimated Blood Loss:     Estimated blood loss was minimal. Impression:               - Two 1 to 2 mm polyps in the transverse colon and                            in the cecum, removed with a cold biopsy forceps.                            Resected and retrieved.                           - Diverticulosis in the sigmoid colon.                           - The examination was  otherwise normal on direct                            and retroflexion views.                           - Personal history of colonic polyps. 2 cm + TV                            adenoma 2016 and 2 other small adenomas Recommendation:           - Patient has a contact number available for                            emergencies. The signs and symptoms of potential                            delayed complications were discussed with the                            patient.  Return to normal activities tomorrow.                            Written discharge instructions were provided to the                            patient.                           - Resume previous diet.                           - Continue present medications.                           - Resume Plavix (clopidogrel) at prior dose today.                           - Repeat colonoscopy is recommended for                            surveillance. The colonoscopy date will be                            determined after pathology results from today's                            exam become available for review.                           - I have placed orders so he can repeat CBC and                            ferritin week of 05/07/2019.                           Need to make sure hgb and iron stores are being                            replete. if this is not happening will plan to                            reassess for upper GI tract AVM's which have been                            ablated in the past. Gatha Mayer, MD 03/19/2019 2:21:00 PM This report has been signed electronically.

## 2019-03-19 NOTE — Progress Notes (Signed)
Pt awake. VSS report given to RN. No anesthetic complications noted 

## 2019-03-21 ENCOUNTER — Telehealth: Payer: Self-pay | Admitting: *Deleted

## 2019-03-21 NOTE — Telephone Encounter (Signed)
  Follow up Call-  Call back number 03/19/2019  Post procedure Call Back phone  # [phone]  Permission to leave phone message Yes  Some recent data might be hidden     Patient questions:  Do you have a fever, pain , or abdominal swelling? No. Pain Score  0 *  Have you tolerated food without any problems? Yes  Have you been able to return to your normal activities? Yes  Do you have any questions about your discharge instructions: Diet   No Medications  No. Follow up visit  No.  Do you have questions or concerns about your Care? No.  Actions: * If pain score is 4 or above: No action needed, pain <4.  1. Have you developed a fever since your procedure? No  2.   Have you had an respiratory symptoms (SOB or cough) since your procedure? No  3.   Have you tested positive for COVID 19 since your procedure No  4.   Have you had any family members/close contacts diagnosed with the COVID 19 since your procedure?  No   If yes to any of these questions please route to Joylene John, RN and Alphonsa Gin, RN.

## 2019-03-23 ENCOUNTER — Telehealth: Payer: Self-pay

## 2019-03-23 DIAGNOSIS — IMO0001 Reserved for inherently not codable concepts without codable children: Secondary | ICD-10-CM

## 2019-03-23 NOTE — Telephone Encounter (Signed)
Jason Velasquez started his Synjardy 3 days ago and says he is tolerating the medication well so far. Delsa Sale- please schedule lab visit for July 7 at 11AM for BMP to check kidney function. Will call him back the end of next week to make sure he is still doing well on the new medication.

## 2019-03-26 ENCOUNTER — Encounter: Payer: Self-pay | Admitting: Internal Medicine

## 2019-03-26 DIAGNOSIS — K5730 Diverticulosis of large intestine without perforation or abscess without bleeding: Secondary | ICD-10-CM | POA: Insufficient documentation

## 2019-03-28 ENCOUNTER — Other Ambulatory Visit: Payer: Self-pay

## 2019-03-28 ENCOUNTER — Other Ambulatory Visit: Payer: Self-pay | Admitting: Cardiovascular Disease

## 2019-03-28 ENCOUNTER — Ambulatory Visit (HOSPITAL_COMMUNITY)
Admission: RE | Admit: 2019-03-28 | Discharge: 2019-03-28 | Disposition: A | Discharge status: Home / Self Care | Payer: PPO | Source: Ambulatory Visit | Attending: Internal Medicine | Admitting: Internal Medicine

## 2019-03-28 ENCOUNTER — Encounter: Payer: Self-pay | Admitting: Internal Medicine

## 2019-03-28 DIAGNOSIS — I6523 Occlusion and stenosis of bilateral carotid arteries: Secondary | ICD-10-CM

## 2019-03-28 NOTE — Progress Notes (Signed)
2 diminutive adenomas Recall 2025

## 2019-04-09 ENCOUNTER — Other Ambulatory Visit: Payer: Self-pay | Admitting: Pharmacist

## 2019-04-09 DIAGNOSIS — IMO0001 Reserved for inherently not codable concepts without codable children: Secondary | ICD-10-CM

## 2019-04-09 NOTE — Progress Notes (Signed)
BMP order.

## 2019-04-10 ENCOUNTER — Other Ambulatory Visit: Payer: Self-pay

## 2019-04-10 ENCOUNTER — Other Ambulatory Visit (INDEPENDENT_AMBULATORY_CARE_PROVIDER_SITE_OTHER): Payer: PPO

## 2019-04-10 DIAGNOSIS — E11319 Type 2 diabetes mellitus with unspecified diabetic retinopathy without macular edema: Secondary | ICD-10-CM | POA: Diagnosis not present

## 2019-04-10 DIAGNOSIS — IMO0001 Reserved for inherently not codable concepts without codable children: Secondary | ICD-10-CM

## 2019-04-11 LAB — BMP8+ANION GAP
Anion Gap: 14 mmol/L (ref 10.0–18.0)
BUN/Creatinine Ratio: 7 — ABNORMAL LOW (ref 10–24)
BUN: 9 mg/dL (ref 8–27)
CO2: 19 mmol/L — ABNORMAL LOW (ref 20–29)
Calcium: 9.2 mg/dL (ref 8.6–10.2)
Chloride: 103 mmol/L (ref 96–106)
Creatinine, Ser: 1.24 mg/dL (ref 0.76–1.27)
GFR calc Af Amer: 71 mL/min/{1.73_m2} (ref >59)
GFR calc non Af Amer: 61 mL/min/{1.73_m2} (ref >59)
Glucose: 136 mg/dL — ABNORMAL HIGH (ref 65–99)
Potassium: 4.4 mmol/L (ref 3.5–5.2)
Sodium: 136 mmol/L (ref 134–144)

## 2019-05-03 DIAGNOSIS — H2513 Age-related nuclear cataract, bilateral: Secondary | ICD-10-CM | POA: Diagnosis not present

## 2019-05-03 DIAGNOSIS — E119 Type 2 diabetes mellitus without complications: Secondary | ICD-10-CM | POA: Diagnosis not present

## 2019-05-03 LAB — HM DIABETES EYE EXAM

## 2019-05-07 ENCOUNTER — Other Ambulatory Visit (INDEPENDENT_AMBULATORY_CARE_PROVIDER_SITE_OTHER): Payer: PPO

## 2019-05-07 DIAGNOSIS — D500 Iron deficiency anemia secondary to blood loss (chronic): Secondary | ICD-10-CM | POA: Diagnosis not present

## 2019-05-07 LAB — CBC
HCT: 41 % (ref 39.0–52.0)
Hemoglobin: 13.1 g/dL (ref 13.0–17.0)
MCHC: 32.1 g/dL (ref 30.0–36.0)
MCV: 81.5 fl (ref 78.0–100.0)
Platelets: 225 10*3/uL (ref 150.0–400.0)
RBC: 5.03 Mil/uL (ref 4.22–5.81)
RDW: 19 % — ABNORMAL HIGH (ref 11.5–15.5)
WBC: 4.8 10*3/uL (ref 4.0–10.5)

## 2019-05-07 LAB — FERRITIN: Ferritin: 15.3 ng/mL — ABNORMAL LOW (ref 22.0–322.0)

## 2019-05-07 MED FILL — LISINOPRIL 10 MG TABS: 10 | 90 days supply | Qty: 90 | Fill #1

## 2019-05-09 NOTE — Progress Notes (Signed)
Explain that hemoglobin is better and now normal Iron level still low  Please have him set up a next available office visit so I can discuss management with him  We need to consider repeating an upper endoscopy or enteroscopy to look for and ablate AVM's again

## 2019-05-17 ENCOUNTER — Other Ambulatory Visit: Payer: Self-pay | Admitting: *Deleted

## 2019-05-17 DIAGNOSIS — IMO0001 Reserved for inherently not codable concepts without codable children: Secondary | ICD-10-CM

## 2019-05-17 NOTE — Telephone Encounter (Signed)
Next appt scheduled 9/17 with PCP. 

## 2019-05-18 MED ORDER — SYNJARDY 5-1000 MG PO TABS: 1.0000 | ORAL_TABLET | Freq: Two times a day (BID) | ORAL | 1 refills | Status: DC

## 2019-05-18 MED FILL — JANUVIA 100 MG TABLET: 100 | 90 days supply | Qty: 90 | Fill #1

## 2019-05-31 ENCOUNTER — Ambulatory Visit: Payer: PPO | Admitting: Internal Medicine

## 2019-06-01 ENCOUNTER — Telehealth: Payer: Self-pay | Admitting: Pharmacist

## 2019-06-01 NOTE — Telephone Encounter (Signed)
Spoke with Dr. Heber [place] about patient's diabetes management and agreed to remove insulin from his medication list since he is no longer taking it.  During the phone call, the pt stated that he previously would use Novolog 70/30 occasionally but has not used it in a while and is currently only taking Synjardy and Januvia for his diabetes therapy and seems to be well controlled (last A1c was 7.3)  Insulin removed from pt's med list.    Kennon Holter, PharmD PGY1 Ambulatory Care Pharmacy Resident [employer] Phone: [phone]

## 2019-06-08 DIAGNOSIS — E119 Type 2 diabetes mellitus without complications: Secondary | ICD-10-CM | POA: Diagnosis not present

## 2019-06-18 ENCOUNTER — Ambulatory Visit (INDEPENDENT_AMBULATORY_CARE_PROVIDER_SITE_OTHER): Payer: PPO | Admitting: Internal Medicine

## 2019-06-18 ENCOUNTER — Encounter: Payer: Self-pay | Admitting: Internal Medicine

## 2019-06-18 ENCOUNTER — Other Ambulatory Visit: Payer: Self-pay

## 2019-06-18 VITALS — BP 152/92 | HR 72 | Ht 70.0 in | Wt 210.0 lb

## 2019-06-18 DIAGNOSIS — Z0000 Encounter for general adult medical examination without abnormal findings: Secondary | ICD-10-CM

## 2019-06-18 NOTE — Progress Notes (Signed)
This AWV is being conducted by [facility] only. The patient was located at home and I was located in [facility]. The patient's identity was confirmed using their DOB and current address. The patient or his/her legal guardian has consented to being evaluated through a telephone encounter and understands the associated risks (an examination cannot be done and the patient may need to come in for an appointment) / benefits (allows the patient to remain at home, decreasing exposure to coronavirus). I personally spent 30 minutes conducting the AWV.  Subjective:   Jason Velasquez is a 63 y.o. male who presents for a Medicare Annual Wellness Visit.  The following items have been reviewed and updated today in the appropriate area in the EMR.   Health Risk Assessment  Height, weight, BMI, and BP Visual acuity if needed Depression screen Fall risk / safety level Advance directive discussion Medical and family history were reviewed and updated Updating list of other providers & suppliers Medication reconciliation, including over the counter medicines Cognitive screen Written screening schedule Risk Factor list Personalized health advice, risky behaviors, and treatment advice  Social History   Social History Narrative   Divorced   2 children, 1 [place] 1 [place]   Lives with daughter's mother (friend)   Former smoker rare EtOH no drugs   Epworth Sleepiness Scale = 7 (as of 07/28/2015)      Current Social History 06/18/2019        Patient lives alone in a home which is 1 story. There are 5 steps with handrail up to the entrance the patient uses.       Patient's method of transportation is personal car.      The highest level of education was high school diploma.      The patient currently, "retired on disability"      Identified important Relationships are: States he has people he can count on but did not wish to disclose list.       Pets : 1 indoor/outdoor Futures trader / Fun: "I have  lots of interests." (Did not wish to disclose list)       Current Stressors: "None"       Religious / Personal Beliefs: "I believe in God." ([place])       L. , RN, BSN             Objective:    Vitals: BP (!) 152/92 (BP Location: Left Arm, Patient Position: Sitting, Cuff Size: Normal) Comment: Patient reported  Pulse 72   Ht 5\' 10"  (1.778 m)   Wt 210 lb (95.3 kg)   BMI 30.13 kg/m  Vitals are patient reported  Activities of Daily Living In your present state of health, do you have any difficulty performing the following activities: 06/18/2019 09/21/2018  Hearing? N N  Vision? N N  Difficulty concentrating or making decisions? N N  Walking or climbing stairs? N Y  Comment - SOB  Dressing or bathing? N N  Doing errands, shopping? N N  Some recent data might be hidden    Goals Goals    . Blood Pressure < 130/80    . Exercise 3x per week (30 min per time)     When gyms open back up    . HEMOGLOBIN A1C < 7.0       Fall Risk Fall Risk  06/18/2019 09/21/2018 09/21/2018 12/01/2017 08/30/2017  Falls in the past year? 0 0 0 No No  Number falls in past yr: - - - - -  Comment - - - - -  Injury with Fall? - - - - -  Comment - - - - -  Risk Factor Category  - - - - -  Comment - - - - -  Risk for fall due to : - - - - -  Risk for fall due to: Comment - - - - -  Follow up Education provided;Falls prevention discussed - - - -   Patient is not interested in seated /standing exercises as, "there is nothing wrong with my balance."  Depression Screen PHQ 2/9 Scores 06/18/2019 09/21/2018 12/01/2017 08/30/2017  PHQ - 2 Score 0 0 0 0  PHQ- 9 Score 1 1 - -     Cognitive Testing Six-Item Cognitive Screener   "I would like to ask you some questions that ask you to use your memory. I am going to name three objects. Please wait until I say all three words, then repeat them. Remember what they are  because I am going to ask you to name them again in a few minutes.  Please repeat these words for me: APPLE-TABLE-PENNY." (Interviewer may repeat names 3 times if necessary but repetition not scored.)  Did patient correctly repeat all three words? Yes - may proceed with screen  What year is this? Correct What month is this? Correct What day of the week is this? Correct  What were the three objects I asked you to remember? . Apple Correct . Table Correct . Penny Correct  Score one point for each incorrect answer.  A score of 2 or more points warrants additional investigation.  Patient's score 0    Assessment and Plan:     Patient will call Dr. Celesta Aver office and ask them to fax the colonoscopy report from July 2020 to our office. Patient plans on going to gym at least 3 days per week for 30-60 minutes per time as soon as the gyms open back up (2/2 Covid)  During the course of the visit the patient was educated and counseled about appropriate screening and preventive services as documented in the assessment and plan.  The printed AVS was given to the patient and included an updated screening schedule, a list of risk factors, and personalized health advice.        Velora Heckler, RN  06/18/2019

## 2019-06-18 NOTE — Progress Notes (Signed)
[facility] Clinic Attending  Case discussed with team.  We reviewed the AWV findings.  I agree with the assessment, diagnosis, and plan of care documented in the AWV note.

## 2019-06-18 NOTE — Patient Instructions (Addendum)
Annual Wellness Visit   Medicare Covered Preventative Screenings and Services  Services & Screenings Men and Women Who How Often Need? Date of Last Service Action  Abdominal Aortic Aneurysm Adults with AAA risk factors Once  March 2016   Alcohol Misuse and Counseling All Adults Screening once a year if no alcohol misuse. Counseling up to 4 face to face sessions.     Bone Density Measurement  Adults at risk for osteoporosis Once every 2 yrs     Lipid Panel Z13.6 All adults without CV disease Once every 5 yrs     Colorectal Cancer   Stool sample or  Colonoscopy All adults 49 and older   Once every year  Every 10 years  July 2020 Please call Dr. Celesta Aver office and ask them to fax the colonoscopy report to our office at [phone].  Depression All Adults Once a year  Today   Diabetes Screening Blood glucose, post glucose load, or GTT Z13.1  All adults at risk  Pre-diabetics  Once per year  Twice per year     Diabetes  Self-Management Training All adults Diabetics 10 hrs first year; 2 hours subsequent years. Requires Copay     Glaucoma  Diabetics  Family history of glaucoma  African Americans 65 yrs +  Hispanic Americans 28 yrs + Annually - requires coppay   Due for eye exam  Hepatitis C Z72.89 or F19.20  High Risk for HCV  Born between 1945 and 1965  Annually  Once     HIV Z11.4 All adults based on risk  Annually btw ages 81 & 50 regardless of risk  Annually > 65 yrs if at increased risk     Lung Cancer Screening Asymptomatic adults aged 73-77 with 30 pack yr history and current smoker OR quit within the last 15 yrs Annually Must have counseling and shared decision making documentation before first screen     Medical Nutrition Therapy Adults with   Diabetes  Renal disease  Kidney transplant within past 3 yrs 3 hours first year; 2 hours subsequent years   Recommended by Dr. Heber Kensington. If interested, please call to schedule appt with Butch Penny, our Diabetic Educator   Obesity and Counseling All adults Screening once a year Counseling if BMI 30 or higher  Today   Tobacco Use Counseling Adults who use tobacco  Up to 8 visits in one year     Vaccines Z23  Hepatitis B  Influenza   Pneumonia  Adults   Once  Once every flu season  Two different vaccines separated by one year Yes  Flu vaccine yearly starting September 1  Next Annual Wellness Visit People with Medicare Every year  Today     [place] Women Who How Often Need  Date of Last Service Action  Mammogram  Z12.31 Women over 5 One baseline ages 70-39. Annually ager 40 yrs+     Pap tests All women Annually if high risk. Every 2 yrs for normal risk women     Screening for cervical cancer with   Pap (Z01.419 nl or Z01.411abnl) &  HPV Z11.51 Women aged 60 to 36 Once every 5 yrs     Screening pelvic and breast exams All women Annually if high risk. Every 2 yrs for normal risk women     Sexually Transmitted Diseases  Chlamydia  Gonorrhea  Syphilis All at risk adults Annually for non pregnant females at increased risk         [place]  Men Who How Ofter Need  Date of Last Service Action  Prostate Cancer - DRE & PSA Men over 50 Annually.  DRE might require a copay.     Sexually Transmitted Diseases  Syphilis All at risk adults Annually for men at increased risk         Things That May Be Affecting Your Health:  Alcohol  Hearing loss  Pain    Depression  Home Safety  Sexual Health  X Diabetes X Lack of physical activity X Stress   Difficulty with daily activities  Loneliness  Tiredness   Drug use  Medicines  Tobacco use   Falls  Motor Vehicle Safety  Weight   Food choices  Oral Health  Other    YOUR PERSONALIZED HEALTH PLAN : 1. Schedule your next subsequent Medicare Wellness visit in one year 2. Attend all of your regular appointments to address your medical issues 3. Complete the preventative screenings and services 4. Please call Dr.  Celesta Aver office and ask them to fax the colonoscopy report to our office at [phone]. 5. Our records indicate you are due for your yearly eye exam. Please call to schedule. If you have had a recent eye exam, please call that office to have the report faxed to Dr. Heber Frederika. 6. It is now flu season. Please get your yearly flu vaccine either at the pharmacy or at your upcoming appt with Dr. Heber Du Quoin on 07/12/2019 at 8:45. 7. Consider meeting with Butch Penny, our Diabetic Educator, to discuss healthy food/drink choices.  Fall Prevention in the Home, Adult Falls can cause injuries. They can happen to people of all ages. There are many things you can do to make your home safe and to help prevent falls. Ask for help when making these changes, if needed. What actions can I take to prevent falls? General Instructions  Use good lighting in all rooms. Replace any light bulbs that burn out.  Turn on the lights when you go into a dark area. Use night-lights.  Keep items that you use often in easy-to-reach places. Lower the shelves around your home if necessary.  Set up your furniture so you have a clear path. Avoid moving your furniture around.  Do not have throw rugs and other things on the floor that can make you trip.  Avoid walking on wet floors.  If any of your floors are uneven, fix them.  Add color or contrast paint or tape to clearly mark and help you see: ? Any grab bars or handrails. ? First and last steps of stairways. ? Where the edge of each step is.  If you use a stepladder: ? Make sure that it is fully opened. Do not climb a closed stepladder. ? Make sure that both sides of the stepladder are locked into place. ? Ask someone to hold the stepladder for you while you use it.  If there are any pets around you, be aware of where they are. What can I do in the bathroom?      Keep the floor dry. Clean up any water that spills onto the floor as soon as it happens.  Remove soap  buildup in the tub or shower regularly.  Use non-skid mats or decals on the floor of the tub or shower.  Attach bath mats securely with double-sided, non-slip rug tape.  If you need to sit down in the shower, use a plastic, non-slip stool.  Install grab bars by the toilet and in the tub and shower.  Do not use towel bars as grab bars. What can I do in the bedroom?  Make sure that you have a light by your bed that is easy to reach.  Do not use any sheets or blankets that are too big for your bed. They should not hang down onto the floor.  Have a firm chair that has side arms. You can use this for support while you get dressed. What can I do in the kitchen?  Clean up any spills right away.  If you need to reach something above you, use a strong step stool that has a grab bar.  Keep electrical cords out of the way.  Do not use floor polish or wax that makes floors slippery. If you must use wax, use non-skid floor wax. What can I do with my stairs?  Do not leave any items on the stairs.  Make sure that you have a light switch at the top of the stairs and the bottom of the stairs. If you do not have them, ask someone to add them for you.  Make sure that there are handrails on both sides of the stairs, and use them. Fix handrails that are broken or loose. Make sure that handrails are as long as the stairways.  Install non-slip stair treads on all stairs in your home.  Avoid having throw rugs at the top or bottom of the stairs. If you do have throw rugs, attach them to the floor with carpet tape.  Choose a carpet that does not hide the edge of the steps on the stairway.  Check any carpeting to make sure that it is firmly attached to the stairs. Fix any carpet that is loose or worn. What can I do on the outside of my home?  Use bright outdoor lighting.  Regularly fix the edges of walkways and driveways and fix any cracks.  Remove anything that might make you trip as you walk  through a door, such as a raised step or threshold.  Trim any bushes or trees on the path to your home.  Regularly check to see if handrails are loose or broken. Make sure that both sides of any steps have handrails.  Install guardrails along the edges of any raised decks and porches.  Clear walking paths of anything that might make someone trip, such as tools or rocks.  Have any leaves, snow, or ice cleared regularly.  Use sand or salt on walking paths during winter.  Clean up any spills in your garage right away. This includes grease or oil spills. What other actions can I take?  Wear shoes that: ? Have a low heel. Do not wear high heels. ? Have rubber bottoms. ? Are comfortable and fit you well. ? Are closed at the toe. Do not wear open-toe sandals.  Use tools that help you move around (mobility aids) if they are needed. These include: ? Canes. ? Walkers. ? Scooters. ? Crutches.  Review your medicines with your doctor. Some medicines can make you feel dizzy. This can increase your chance of falling. Ask your doctor what other things you can do to help prevent falls. Where to find more information  Centers for Disease Control and Prevention, STEADI: https://garcia.biz/  [employer] on Aging: BrainJudge.co.uk Contact a doctor if:  You are afraid of falling at home.  You feel weak, drowsy, or dizzy at home.  You fall at home. Summary  There are many simple things that you can do to make  your home safe and to help prevent falls.  Ways to make your home safe include removing tripping hazards and installing grab bars in the bathroom.  Ask for help when making these changes in your home. This information is not intended to replace advice given to you by your health care provider. Make sure you discuss any questions you have with your health care provider. Document Released: 07/17/2009 Document Revised: 01/11/2019 Document Reviewed: 05/05/2017 Elsevier  Patient Education  2020 [place] Maintenance, Male Adopting a healthy lifestyle and getting preventive care are important in promoting health and wellness. Ask your health care provider about:  The right schedule for you to have regular tests and exams.  Things you can do on your own to prevent diseases and keep yourself healthy. What should I know about diet, weight, and exercise? Eat a healthy diet   Eat a diet that includes plenty of vegetables, fruits, low-fat dairy products, and lean protein.  Do not eat a lot of foods that are high in solid fats, added sugars, or sodium. Maintain a healthy weight Body mass index (BMI) is a measurement that can be used to identify possible weight problems. It estimates body fat based on height and weight. Your health care provider can help determine your BMI and help you achieve or maintain a healthy weight. Get regular exercise Get regular exercise. This is one of the most important things you can do for your health. Most adults should:  Exercise for at least 150 minutes each week. The exercise should increase your heart rate and make you sweat (moderate-intensity exercise).  Do strengthening exercises at least twice a week. This is in addition to the moderate-intensity exercise.  Spend less time sitting. Even light physical activity can be beneficial. Watch cholesterol and blood lipids Have your blood tested for lipids and cholesterol at 64 years of age, then have this test every 5 years. You may need to have your cholesterol levels checked more often if:  Your lipid or cholesterol levels are high.  You are older than 64 years of age.  You are at high risk for heart disease. What should I know about cancer screening? Many types of cancers can be detected early and may often be prevented. Depending on your health history and family history, you may need to have cancer screening at various ages. This may include screening for:   Colorectal cancer.  Prostate cancer.  Skin cancer.  Lung cancer. What should I know about heart disease, diabetes, and high blood pressure? Blood pressure and heart disease  High blood pressure causes heart disease and increases the risk of stroke. This is more likely to develop in people who have high blood pressure readings, are of African descent, or are overweight.  Talk with your health care provider about your target blood pressure readings.  Have your blood pressure checked: ? Every 3-5 years if you are 10-3 years of age. ? Every year if you are 35 years old or older.  If you are between the ages of 23 and 57 and are a current or former smoker, ask your health care provider if you should have a one-time screening for abdominal aortic aneurysm (AAA). Diabetes Have regular diabetes screenings. This checks your fasting blood sugar level. Have the screening done:  Once every three years after age 67 if you are at a normal weight and have a low risk for diabetes.  More often and at a younger age if you are  overweight or have a high risk for diabetes. What should I know about preventing infection? Hepatitis B If you have a higher risk for hepatitis B, you should be screened for this virus. Talk with your health care provider to find out if you are at risk for hepatitis B infection. Hepatitis C Blood testing is recommended for:  Everyone born from [birth year] through 1966.  Anyone with known risk factors for hepatitis C. Sexually transmitted infections (STIs)  You should be screened each year for STIs, including gonorrhea and chlamydia, if: ? You are sexually active and are younger than 64 years of age. ? You are older than 64 years of age and your health care provider tells you that you are at risk for this type of infection. ? Your sexual activity has changed since you were last screened, and you are at increased risk for chlamydia or gonorrhea. Ask your health care provider if  you are at risk.  Ask your health care provider about whether you are at high risk for HIV. Your health care provider may recommend a prescription medicine to help prevent HIV infection. If you choose to take medicine to prevent HIV, you should first get tested for HIV. You should then be tested every 3 months for as long as you are taking the medicine. Follow these instructions at home: Lifestyle  Do not use any products that contain nicotine or tobacco, such as cigarettes, e-cigarettes, and chewing tobacco. If you need help quitting, ask your health care provider.  Do not use street drugs.  Do not share needles.  Ask your health care provider for help if you need support or information about quitting drugs. Alcohol use  Do not drink alcohol if your health care provider tells you not to drink.  If you drink alcohol: ? Limit how much you have to 0-2 drinks a day. ? Be aware of how much alcohol is in your drink. In the [place] one drink equals one 12 oz bottle of beer (355 mL), one 5 oz glass of wine (148 mL), or one 1 oz glass of hard liquor (44 mL). General instructions  Schedule regular health, dental, and eye exams.  Stay current with your vaccines.  Tell your health care provider if: ? You often feel depressed. ? You have ever been abused or do not feel safe at home. Summary  Adopting a healthy lifestyle and getting preventive care are important in promoting health and wellness.  Follow your health care provider's instructions about healthy diet, exercising, and getting tested or screened for diseases.  Follow your health care provider's instructions on monitoring your cholesterol and blood pressure. This information is not intended to replace advice given to you by your health care provider. Make sure you discuss any questions you have with your health care provider. Document Released: 03/18/2008 Document Revised: 09/13/2018 Document Reviewed: 09/13/2018 Elsevier Patient  Education  2020 [employer].

## 2019-06-18 NOTE — Progress Notes (Signed)
I discussed the AWV findings with the RN who conducted the visit. I was present in the office suite and immediately available to provide assistance and direction throughout the time the service was provided.   

## 2019-06-21 ENCOUNTER — Encounter: Payer: PPO | Admitting: Internal Medicine

## 2019-06-22 ENCOUNTER — Encounter: Payer: Self-pay | Admitting: *Deleted

## 2019-06-25 ENCOUNTER — Other Ambulatory Visit: Payer: Self-pay | Admitting: Cardiovascular Disease

## 2019-07-02 ENCOUNTER — Other Ambulatory Visit: Payer: Self-pay | Admitting: Cardiovascular Disease

## 2019-07-12 ENCOUNTER — Ambulatory Visit (INDEPENDENT_AMBULATORY_CARE_PROVIDER_SITE_OTHER): Payer: PPO | Admitting: Radiation Oncology

## 2019-07-12 ENCOUNTER — Other Ambulatory Visit: Payer: Self-pay

## 2019-07-12 ENCOUNTER — Encounter: Payer: PPO | Admitting: Internal Medicine

## 2019-07-12 VITALS — BP 122/77 | HR 76 | Wt 211.6 lb

## 2019-07-12 DIAGNOSIS — R3915 Urgency of urination: Secondary | ICD-10-CM

## 2019-07-12 DIAGNOSIS — Z6830 Body mass index (BMI) 30.0-30.9, adult: Secondary | ICD-10-CM

## 2019-07-12 DIAGNOSIS — E119 Type 2 diabetes mellitus without complications: Secondary | ICD-10-CM

## 2019-07-12 DIAGNOSIS — R350 Frequency of micturition: Secondary | ICD-10-CM

## 2019-07-12 DIAGNOSIS — H538 Other visual disturbances: Secondary | ICD-10-CM

## 2019-07-12 DIAGNOSIS — N529 Male erectile dysfunction, unspecified: Secondary | ICD-10-CM | POA: Diagnosis not present

## 2019-07-12 DIAGNOSIS — Z0000 Encounter for general adult medical examination without abnormal findings: Secondary | ICD-10-CM

## 2019-07-12 DIAGNOSIS — Z23 Encounter for immunization: Secondary | ICD-10-CM

## 2019-07-12 DIAGNOSIS — R634 Abnormal weight loss: Secondary | ICD-10-CM

## 2019-07-12 DIAGNOSIS — Z79899 Other long term (current) drug therapy: Secondary | ICD-10-CM

## 2019-07-12 DIAGNOSIS — R3912 Poor urinary stream: Secondary | ICD-10-CM | POA: Diagnosis not present

## 2019-07-12 DIAGNOSIS — D500 Iron deficiency anemia secondary to blood loss (chronic): Secondary | ICD-10-CM

## 2019-07-12 DIAGNOSIS — N39498 Other specified urinary incontinence: Secondary | ICD-10-CM

## 2019-07-12 DIAGNOSIS — Z7982 Long term (current) use of aspirin: Secondary | ICD-10-CM

## 2019-07-12 DIAGNOSIS — Z7984 Long term (current) use of oral hypoglycemic drugs: Secondary | ICD-10-CM

## 2019-07-12 DIAGNOSIS — N401 Enlarged prostate with lower urinary tract symptoms: Secondary | ICD-10-CM

## 2019-07-12 DIAGNOSIS — I25119 Atherosclerotic heart disease of native coronary artery with unspecified angina pectoris: Secondary | ICD-10-CM

## 2019-07-12 DIAGNOSIS — Z7902 Long term (current) use of antithrombotics/antiplatelets: Secondary | ICD-10-CM

## 2019-07-12 DIAGNOSIS — N400 Enlarged prostate without lower urinary tract symptoms: Secondary | ICD-10-CM | POA: Insufficient documentation

## 2019-07-12 DIAGNOSIS — E785 Hyperlipidemia, unspecified: Secondary | ICD-10-CM | POA: Diagnosis not present

## 2019-07-12 DIAGNOSIS — I10 Essential (primary) hypertension: Secondary | ICD-10-CM | POA: Diagnosis not present

## 2019-07-12 LAB — POCT GLYCOSYLATED HEMOGLOBIN (HGB A1C): Hemoglobin A1C: 7 % — AB (ref 4.0–5.6)

## 2019-07-12 LAB — GLUCOSE, CAPILLARY: Glucose-Capillary: 155 mg/dL — ABNORMAL HIGH (ref 70–99)

## 2019-07-12 NOTE — Progress Notes (Signed)
   CC: erectile dysfunction  HPI:  [name] is a 63 y.o. M here with complaints of erectile dysfunction and for follow up of his chronic medical conditions.  Past Medical History:  Diagnosis Date  . Angina, class III ([place]) 08/24/2015  . Arthritis    "hands" (01/25/2018)  . AVM (arteriovenous malformation) 08/15/2017  . CAD (coronary artery disease) of artery bypass graft    CABG- 2007   . Carotid stenosis 03/03/2016   123456 RICA, [id number] LICA, 123XX123 RECA stenosis Repeat 12/2016.  . Claudication ([place])   . Controlled type 2 diabetes mellitus without complication ([place]) 123XX123   Dx March of 2016. GAD65 negative Anti Islet cell negative    . DKA (diabetic ketoacidoses) ([place]) 12/07/2014  . Dyspnea   . History of blood transfusion ~ 08/2017   "related to LGIB"  . History of hiatal hernia   . Hx of adenomatous colonic polyps 05/27/2015  . Hypercholesterolemia   . Hypertension   . Hypertensive heart disease 03/03/2016  . 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 Left nephrolithiasis 12/07/2014  . Migraine    "only in my 30's" (01/25/2018)  . Non-ST elevation (NSTEMI) myocardial infarction ([place])   . Pneumonia ~ 1990  . Stroke [facility]) 07/12/2014   "I didn't realize I'd had one; hospital found it"; denies residual on 01/25/2018  . Tubular adenoma of colon 05/31/2015   Colonoscopy 05/20/15 with [name]: 3 tubullar adenoma's largest 2.3cm.  Plan for repeat Colonoscopy in 2019.   Review of Systems:    Review of Systems  Constitutional: Positive for weight loss (3-4 lb weight loss). Negative for chills and fever.  HENT: Negative for congestion and sore throat.   Eyes: Positive for blurred vision.  Respiratory: Negative for cough and shortness of breath (stable).   Cardiovascular: Negative for chest pain and leg swelling.  Gastrointestinal: Negative for abdominal pain and blood in stool.  Genitourinary: Positive for urgency.       Weak stream;  incontinence; no nocturia; erectile dysfunction: unable to have or maintain any erection  Musculoskeletal: Negative for falls.  Skin:       Pruritis of his upper back  Neurological: Negative for dizziness and loss of consciousness.  Psychiatric/Behavioral: Negative for depression. The patient is not nervous/anxious.   All other systems reviewed and are negative.  Physical Exam:  Vitals:   07/12/19 0852  BP: 122/77  Pulse: 76  Weight: 211 lb 9.6 oz (96 kg)   Physical Exam  Constitutional: He is oriented to person, place, and time and well-developed, well-nourished, and in no distress. No distress.  HENT:  Head: Normocephalic.  Neck: Normal range of motion.  Cardiovascular: Normal rate, regular rhythm and normal heart sounds.  No murmur heard. No lower extremity edema  Pulmonary/Chest: Effort normal. No respiratory distress.  Abdominal: Soft. Bowel sounds are normal. He exhibits no distension. There is no abdominal tenderness.  Musculoskeletal: Normal range of motion.  Neurological: He is alert and oriented to person, place, and time.  Skin: Skin is warm and dry. He is not diaphoretic.  No obvious changes to the skin of his upper back; no rashes, erythema, papules or plaques  Psychiatric: Affect normal.  Nursing note and vitals reviewed.  Assessment & Plan:   See Encounters Tab for problem based charting.  Patient seen with Dr. Daryll Drown

## 2019-07-12 NOTE — Progress Notes (Signed)
Internal Medicine Clinic Attending  I saw and evaluated the patient.  I personally confirmed the key portions of the history and exam documented by Dr. Lanier and I reviewed pertinent patient test results.  The assessment, diagnosis, and plan were formulated together and I agree with the documentation in the resident's note.   

## 2019-07-12 NOTE — Assessment & Plan Note (Addendum)
-  flu shot today -referral to ophthalmology -diabetic foot exam without findings

## 2019-07-12 NOTE — Assessment & Plan Note (Signed)
-  pt with hx of CAD on atorvastatin 40 mg with LDL of 29, HDL of 22 and triglycerides of 306 10 months prior -no side effects such as myalgias  -encouraged to incorporate healthy cholesterol into his diet  -no lipid panel at this time; will obtain CMP at next visit with labs to check AST/ALT -continue atorvastatin

## 2019-07-12 NOTE — Patient Instructions (Addendum)
Thank you for coming to your appointment. It was so nice to see you.   Jason Velasquez was seen today for medical management of chronic issues and flu vaccine.  Diagnoses and all orders for this visit:  Essential hypertension       -     your blood pressure is PERFECT! Keep up the good work       -     continue current medications  Type 2 diabetes mellitus without complication, without long-term current use of insulin ([place]) -     POC Hbg A1C: 7.0 -     Your diabetes is well controlled. Keep up the good work and continue to watch what you eat and exercise -     Continue current medications       -     Referral to ophthalmology  Dyslipidemia       -     Cholesterol is historically very well controlled, we won't check it again today       -     Continue atorvastatin       -     Try and eat more good cholesterol such as avocado, fish and nuts  Healthcare maintenance       -     Flu shot today  Erectile dysfunction, unspecified erectile dysfunction type       -     Referral to urology  Benign prostatic hypertrophy recommendations:         -     Avoid fluids prior to bedtime or before going out       -     Reduce consumption of mild diuretics such as caffeine and alcohol       -     Double void to empty the bladder more completely  Please keep up the good work.  Sincerely,  Al Decant, MD

## 2019-07-12 NOTE — Assessment & Plan Note (Addendum)
-  pt with hx of HTN, HLD, DM and CAD with reported 15+ year history of ED -reports total inability to achieve and maintain an erection for the last 10 years -used viagra 15 years ago with moderate improvement -patient on multiple nitrates and not a good candidate for treatment with PDE5 inhibitors -discussed importance of maintaining tight glucose control which he does -referred to urology for further workup and non medical intervention

## 2019-07-12 NOTE — Assessment & Plan Note (Signed)
-  63 yo M with reports of urinary urgency with occasional urinary incontinence and weak stream; denies nocturia  -patient states the problem is mild and he does not need medication at this time -we discussed certain behavioral interventions that could help including avoiding diuretics such as caffeine and alcohol, double voiding and timed hourly urination to keep his bladder empty so he experiences less urgency/incontinence -referred to urology for ED but patient can have his prostate checked at this time as well

## 2019-07-12 NOTE — Assessment & Plan Note (Addendum)
-  pt with known CAD and angina on aspirin 81, atorvastatin 40, imdur 120, lisinopril 10 mg, metoprolol 200 mg, ranexa 1000 mg BID and nitro prn without chest pain or SOB -blood pressure well controlled at 122/77 today -diabetes well controlled with A1c today of 7 -cholesterol well controlled with last LDL of 29; HDL low at 22  -patient encouraged to continue medications and to incorporate healthy cholesterol into his diet

## 2019-07-12 NOTE — Assessment & Plan Note (Addendum)
-  pt with known history of well controlled diabetes without insulin use on empagliflozin-metformin 02-999 BID and januvia 100 mg daily -A1c of 7 today -no sx of hypoglycemia such as dizziness or LOC -reports some blurry vision; recently saw optometry -diabetic foot exam today without findings -placed referral for ophthalmology today -talked with Butch Penny today -continue current medications -follow up in 3 months for A1c check

## 2019-07-12 NOTE — Assessment & Plan Note (Addendum)
Today's Vitals   07/12/19 0852  BP: 122/77  Pulse: 76  Weight: 211 lb 9.6 oz (96 kg)   Body mass index is 30.36 kg/m.  -pt with known hypertension on lisinopril 10 mg and metoprolol 200 mg; no medication side effects -blood pressure well controlled -last BMP 3 months ago; plan to repeat in 9 months  -continue current medications

## 2019-07-12 NOTE — Assessment & Plan Note (Signed)
-  pt with known hx of iron deficiency anemia secondary to chronic blood loss from suspected AVMs followed closely by GI -no sx of anemia including fatigue and dizziness -no hematochezia -no pallor on exam -no CBC needed at this time -continue to follow with GI

## 2019-07-16 ENCOUNTER — Encounter: Payer: Self-pay | Admitting: *Deleted

## 2019-07-16 DIAGNOSIS — R350 Frequency of micturition: Secondary | ICD-10-CM | POA: Diagnosis not present

## 2019-07-16 DIAGNOSIS — R3911 Hesitancy of micturition: Secondary | ICD-10-CM | POA: Diagnosis not present

## 2019-07-16 DIAGNOSIS — N521 Erectile dysfunction due to diseases classified elsewhere: Secondary | ICD-10-CM | POA: Diagnosis not present

## 2019-07-26 ENCOUNTER — Encounter: Payer: Self-pay | Admitting: Internal Medicine

## 2019-07-30 ENCOUNTER — Ambulatory Visit (INDEPENDENT_AMBULATORY_CARE_PROVIDER_SITE_OTHER): Payer: PPO | Admitting: Cardiovascular Disease

## 2019-07-30 ENCOUNTER — Encounter: Payer: Self-pay | Admitting: Cardiovascular Disease

## 2019-07-30 ENCOUNTER — Other Ambulatory Visit: Payer: Self-pay

## 2019-07-30 VITALS — BP 126/70 | HR 71 | Ht 70.0 in | Wt 210.8 lb

## 2019-07-30 DIAGNOSIS — E7800 Pure hypercholesterolemia, unspecified: Secondary | ICD-10-CM | POA: Diagnosis not present

## 2019-07-30 DIAGNOSIS — I10 Essential (primary) hypertension: Secondary | ICD-10-CM

## 2019-07-30 DIAGNOSIS — I6523 Occlusion and stenosis of bilateral carotid arteries: Secondary | ICD-10-CM | POA: Diagnosis not present

## 2019-07-30 DIAGNOSIS — I2510 Atherosclerotic heart disease of native coronary artery without angina pectoris: Secondary | ICD-10-CM | POA: Diagnosis not present

## 2019-07-30 DIAGNOSIS — I739 Peripheral vascular disease, unspecified: Secondary | ICD-10-CM

## 2019-07-30 DIAGNOSIS — I351 Nonrheumatic aortic (valve) insufficiency: Secondary | ICD-10-CM | POA: Diagnosis not present

## 2019-07-30 NOTE — Patient Instructions (Signed)
Medication Instructions:  Your physician recommends that you continue on your current medications as directed. Please refer to the Current Medication list given to you today.  *If you need a refill on your cardiac medications before your next appointment, please call your pharmacy*  Lab Work: NONE   Testing/Procedures: NONE  Follow-Up: At [facility], you and your health needs are our priority.  As part of our continuing mission to provide you with exceptional heart care, we have created designated Provider Care Teams.  These Care Teams include your primary Cardiologist (physician) and Advanced Practice Providers (APPs -  Physician Assistants and Nurse Practitioners) who all work together to provide you with the care you need, when you need it.  Your next appointment:   6 months  You will receive a reminder letter in the mail two months in advance. If you don't receive a letter, please call our office to schedule the follow-up appointment.  The format for your next appointment:   Either In Person or Virtual  Provider:   You may see Skeet Latch, MD or one of the following Advanced Practice Providers on your designated Care Team:    Kerin Ransom, PA-C  [place], [place]  [name], [place]

## 2019-07-30 NOTE — Progress Notes (Signed)
Cardiology Office Note   Date:  07/30/2019   ID:  Jason Velasquez, DOB 12/21/1955, MRN [id number]  PCP:  [name], DO  Cardiologist:   Skeet Latch, MD   No chief complaint on file.       Patient ID: Jason Velasquez is a 63 y.o. male with CAD s/p CABG ( LIMA-->LAD, SVG-->PDA, SVG-->OM1), multiple PCIs , chronic stable angina, PAD, hypertension, diabetes and CVA who presents for follow up.  Jason Velasquez was first evaluated for chest pain on 07/27/15.  He previously underwent CABG in 2007. He had a nuclear stress test on 08/21/15 revealed LVEF 57% with moderate ischemia in the inferoseptal, inferior, and anterior locations. He subsequently underwent left heart catheterization on 08/26/15 that revealed severe three vessel disease including a 90% left main lesion and an occluded RCA and LAD. His LIMA to the LAD was patent but all other vein grafts were occluded. There were no optimal PCI targets.  He was started on carvedilol and Ranexa. Jason Velasquez also underwent ABI testing 08/2015 that revealed diffuse atherosclerosis from the proximal abdominal aorta through the common and external iliac arteries bilaterally. There was no detectable flow in the L peroneal or anterior tibial arteries. ABIs were near 0.8 bilaterally. He was referred to Dr. Gwenlyn Found where medical management was advised. He was noted to have carotid bruits and was referred for carotid ultrasound on 12/09/15.  This revealed 40-59% stenosis on the right and 1-39% stenosis on the left.  He had repeat LE Dopplers and ABIs 03/2017 that were unchanged from prior.  Medical management was recommended.    Jason Velasquez continued to have worsening angina despite optimal medical therapy.  He was referred for [facility]  05/2017 where he was found to have progression of the distal LM/LAD/LCx lesion.  He underwent DES placement in the ostial LCx, mid LCx, and ostial LAD.  The ostial LAD was treated with a cutting ballpon reducing the stenosis from 85% to 30%.   It was recommended that he continue clopidogrel and aspirin indefinitely.  He had an echo 06/01/17 that showed LVEF 65-70% with grade 1 diastolic dysfunction.  He followed up on 07/2017 and continued to have angina especially at night.  There is some concern that it may also be GERD.  Therefore he was given a trial of pantoprazole and Imdur 60 mg daily was restarted.  He called our office 11/7 with progressive symptoms.  He was referred for cardiac catheterization but was found to be anemic with a hemoglobin of 7.7.  He was transfused and underwent upper endoscopy on 11/9.  He was found to have a hiatal hernia and 2 AVMs that were treated with APC.  He also received IV iron.  He subsequently underwent left heart catheterization that again showed severe three-vessel disease not amenable to PCI.  He continues to have refractory angina despite optimal medical therapy.  He had another left heart catheterization 01/25/2018 at which time his ostial LAD was found to be 99% stenosed.  A Sierra DES was successfully implanted.  Since his last appointment Jason Velasquez has been doing well.  He has very rare episodes of chest pain.  One occurred the other night when lying in bed.  He attributed this to being cold.  Once he pulled his blanket up he had no more chest pain.  He denies any exertional chest pain.  He does continue to have claudication after walking approximately 0.25 miles.  He still tries to walk for 15 or  20 minutes.  His breathing is stable with exertion.  He denies any lower extremity edema, orthopnea, or PND.  At home his blood pressures been mostly in the 120s but occasionally goes up to 140.  He has been working on his diet and is thinking about purchasing a bike to get more exercise.   Past Medical History:  Diagnosis Date   Angina, class III ([place]) 08/24/2015   Arthritis    "hands" (01/25/2018)   AVM (arteriovenous malformation) 08/15/2017   CAD (coronary artery disease) of artery bypass graft     CABG- 2007    Carotid stenosis 03/03/2016   123456 RICA, 123456 LICA, 123XX123 RECA stenosis Repeat 12/2016.   Claudication [facility])    Controlled type 2 diabetes mellitus without complication ([place]) 123XX123   Dx March of 2016. GAD65 negative Anti Islet cell negative     DKA (diabetic ketoacidoses) ([place]) 12/07/2014   Dyspnea    History of blood transfusion ~ 08/2017   "related to LGIB"   History of hiatal hernia    Hx of adenomatous colonic polyps 05/27/2015   Hypercholesterolemia    Hypertension    Hypertensive heart disease 03/03/2016   Iron deficiency anemia due to chronic blood loss suspect from AVMs on Plavix    Left nephrolithiasis 12/07/2014   Migraine    "only in my 19's" (01/25/2018)   Non-ST elevation (NSTEMI) myocardial infarction [facility])    Pneumonia ~ 1990   Stroke ([place]) 07/12/2014   "I didn't realize I'd had one; hospital found it"; denies residual on 01/25/2018   Tubular adenoma of colon 05/31/2015   Colonoscopy 05/20/15 with [name]: 3 tubullar adenoma's largest 2.3cm.  Plan for repeat Colonoscopy in 2019.    Past Surgical History:  Procedure Laterality Date   CARDIAC CATHETERIZATION N/A 08/26/2015   Procedure: Left Heart Cath and Cors/Grafts Angiography;  Surgeon: [name], MD;  Location: [place] CV LAB;  Service: Cardiovascular;  Laterality: N/A;   CORONARY ARTERY BYPASS GRAFT  2007   "CABG X3", at [place] 05/25/2017   Procedure: [place];  Surgeon: [name], MD;  Location: [place] CV LAB;  Service: Cardiovascular;  Laterality: N/A;   CORONARY BALLOON ANGIOPLASTY N/A 01/25/2018   Procedure: CORONARY BALLOON ANGIOPLASTY;  Surgeon: [name], MD;  Location: [place] CV LAB;  Service: Cardiovascular;  Laterality: N/A;   CORONARY STENT INTERVENTION N/A 05/25/2017   Procedure: CORONARY STENT INTERVENTION;  Surgeon: [name], MD;  Location: [place]  CV LAB;  Service: Cardiovascular;  Laterality: N/A;   CORONARY STENT INTERVENTION N/A 01/25/2018   Procedure: CORONARY STENT INTERVENTION;  Surgeon: [name], MD;  Location: [place] CV LAB;  Service: Cardiovascular;  Laterality: N/A;   ESOPHAGOGASTRODUODENOSCOPY N/A 08/12/2017   Procedure: ESOPHAGOGASTRODUODENOSCOPY (EGD);  Surgeon: Jerene Bears, MD;  Location: [facility] ENDOSCOPY;  Service: Gastroenterology;  Laterality: N/A;   LEFT HEART CATH AND CORS/GRAFTS ANGIOGRAPHY N/A 05/23/2017   Procedure: LEFT HEART CATH AND CORS/GRAFTS ANGIOGRAPHY;  Surgeon: [name], MD;  Location: [place] CV LAB;  Service: Cardiovascular;  Laterality: N/A;   LEFT HEART CATH AND CORS/GRAFTS ANGIOGRAPHY N/A 08/15/2017   Procedure: LEFT HEART CATH AND CORS/GRAFTS ANGIOGRAPHY;  Surgeon: Burnell Blanks, MD;  Location: [place] CV LAB;  Service: Cardiovascular;  Laterality: N/A;   LEFT HEART CATH AND CORS/GRAFTS ANGIOGRAPHY N/A 01/25/2018   Procedure: LEFT HEART CATH AND CORS/GRAFTS ANGIOGRAPHY;  Surgeon: [name]  W, MD;  Location: [place] CV LAB;  Service: Cardiovascular;  Laterality: N/A;    Current Outpatient Medications  Medication Sig Dispense Refill   acetaminophen (TYLENOL) 500 MG tablet Take 1,000 mg by mouth every 6 (six) hours as needed for moderate pain or headache.     aspirin EC 81 MG tablet Take 81 mg by mouth daily.     atorvastatin (LIPITOR) 40 MG tablet Take 1 tablet (40 mg total) by mouth every evening. 90 tablet 3   clopidogrel (PLAVIX) 75 MG tablet TAKE 1 TABLET BY MOUTH DIALY WITH BREAKFAST 90 tablet 1   Empagliflozin-metFORMIN HCl (SYNJARDY) 02-999 MG TABS Take 1 tablet by mouth 2 (two) times daily with a meal. 180 tablet 1   ferrous sulfate 325 (65 FE) MG tablet Take 1 tablet (325 mg total) by mouth every other day. 100 tablet 1   Hypromellose (ARTIFICIAL TEARS OP) Place 1 drop into both eyes daily as needed (for dry eyes).      isosorbide  mononitrate (IMDUR) 120 MG 24 hr tablet TAKE 1/2 (ONE-HALF) TABLET BY MOUTH IN THE MORNING AND TAKE 1 TABLET IN THE EVENING 135 tablet 0   Lancets MISC 1 Device by Does not apply route 4 (four) times daily -  before meals and at bedtime. 100 each 11   lisinopril (PRINIVIL,ZESTRIL) 10 MG tablet TAKE 1 TABLET BY MOUTH DAILY. 90 tablet 3   metoprolol (TOPROL-XL) 200 MG 24 hr tablet Take 1 tablet (200 mg total) by mouth daily. 90 tablet 3   nitroGLYCERIN (NITROSTAT) 0.4 MG SL tablet Place 1 tablet (0.4 mg total) under the tongue every 5 (five) minutes as needed for chest pain. 25 tablet 2   ranolazine (RANEXA) 1000 MG SR tablet Take 500 mg by mouth 2 (two) times daily. TAKE 1/2 TABLET BY MOUTH TWICE A DAY      sitaGLIPtin (JANUVIA) 100 MG tablet Take 1 tablet (100 mg total) by mouth daily. 90 tablet 3   No current facility-administered medications for this visit.     Allergies:   Patient has no known allergies.    Social History:  The patient  reports that he quit smoking about 9 years ago. His smoking use included cigarettes. He has a 76.00 pack-year smoking history. He has never used smokeless tobacco. He reports current alcohol use. He reports that he does not use drugs.   Family History:  The patient's family history includes Crohn's disease in his daughter; Diabetes in his father; Hypertension in his mother.    ROS:  Please see the history of present illness.  Otherwise, review of systems are positive for none.   All other systems are reviewed and negative.    PHYSICAL EXAM: VS:  BP 126/70    Pulse 71    Ht 5\' 10"  (1.778 m)    Wt 210 lb 12.8 oz (95.6 kg)    SpO2 100%    BMI 30.25 kg/m  , BMI Body mass index is 30.25 kg/m. GENERAL:  Well appearing HEENT: Pupils equal round and reactive, fundi not visualized, oral mucosa unremarkable NECK:  No jugular venous distention, waveform within normal limits, carotid upstroke brisk and symmetric, + carotid bruits LUNGS:  Clear to  auscultation bilaterally HEART:  RRR.  PMI not displaced or sustained,S1 and S2 within normal limits, no S3, no S4, no clicks, no rubs, no murmurs ABD:  Flat, positive bowel sounds normal in frequency in pitch, no bruits, no rebound, no guarding, no midline pulsatile mass, no  hepatomegaly, no splenomegaly EXT:  2 plus pulses throughout, no edema, no cyanosis no clubbing SKIN:  No rashes no nodules NEURO:  Cranial nerves II through XII grossly intact, motor grossly intact throughout PSYCH:  Cognitively intact, oriented to person place and time   EKG:  EKG is not ordered today. The ekg ordered today demonstrates sinus rhythm rate 82 bpm.  Prior inferior infarct.  07/02/16: Sinus rhythm rate 75 bpm.  Prior inferior infarct  12/29/16: Sinus rhythm.  Incomplete RBBB.  Prior inferior infarct.  05/18/17: Sinus rhythm. Rate 87 bpm. B 1 PVCs. 07/18/17: Sinus rhythm.  Rate 71 bpm.  08/11/17: Sinus rhythm.  Rate 77 bpm.    07/30/19: Sinus rhythm.  Rate 71 bpm.  Prior inferior infarct  LHC 08/15/17:  Mid RCA lesion is 100% stenosed.  Ost RCA to Prox RCA lesion is 70% stenosed.  Ost 1st Mrg lesion is 60% stenosed.  Previously placed Ost 1st Mrg to 1st Mrg stent (unknown type) is widely patent.  Origin lesion is 100% stenosed.  Origin lesion is 100% stenosed.  Prox Graft lesion is 20% stenosed.  Prox LAD to Mid LAD lesion is 100% stenosed.  Previously placed Ost Cx to Prox Cx stent (unknown type) is widely patent.  Previously placed Ost LM to Dist LM stent (unknown type) is widely patent.  Previously placed Prox Cx to Mid Cx stent (unknown type) is widely patent.  SVG graft was not visualized.  SVG graft was not visualized.  Post intervention, there is a -1% residual stenosis.  LIMA graft was visualized by angiography and is normal in caliber.  The graft exhibits minimal luminal irregularities.  Post intervention, there is a -1% residual stenosis.  Ost LAD to Prox LAD lesion is  99% stenosed.  Post intervention, there is a -1% residual stenosis.   1. Severe triple vessel CAD s/p CABG with 1/3 patent bypass grafts.  2. The LAD is known to be chronically occluded in the mid segment. The mid and distal LAD fills from the patent LIMA graft. The ostial LAD is jailed by the stent extending from the left main into the Circumflex. The proximal LAD has a 99% stenosis following by an aneurysmal segment then a total occlusion.  3. The left main stent is patent 4. The ostial/proximal Circumflex stent is patent. The first OM stent is patent. There is a 60% ostial stenosis in the first OM branch just prior to the stent. The SVG to the OM is known to be occluded. The mid Circumflex stent is patent.  5. The native RCA has a long 70% proximal stenosis and 100% mid chronic occlusion. The vein graft the to distal RCA is known to be occluded and was not injected.  6. Normal filling pressures  Recommendations: Continue medical management of CAD. No focal targets for PCI. The proximal LAD stenosis is severe but the majority of this territory is supplied by the LIMA graft and the LAD is jailed by the LM/Circ stent. I suspect that his anemia has contributed to his recent chest pain given his severe, diffuse CAD.    Echo 06/01/17: Study Conclusions  - Procedure narrative: Transthoracic echocardiography. Image   quality was poor. Intravenous contrast (Definity) was   administered. - Left ventricle: The cavity size was normal. There was mild focal   basal hypertrophy of the septum. Systolic function was vigorous.   The estimated ejection fraction was in the range of 65% to 70%.   Wall motion was normal; there were no  regional wall motion   abnormalities. Doppler parameters are consistent with abnormal   left ventricular relaxation (grade 1 diastolic dysfunction). - Aortic valve: Trileaflet; mildly thickened, mildly calcified   leaflets. - Left atrium: The atrium was mildly  dilated.   Lexiscan Cardiolite 08/21/15:  Nuclear stress EF: 57%.  The left ventricular ejection fraction is normal (55-65%).  There was no ST segment deviation noted during stress.  No T wave inversion was noted during stress.  Defect 1: There is a small defect of moderate severity present in the basal inferoseptal and basal inferior location.  Defect 2: There is a defect present in the basal anterior, mid anterior and apical anterior location.  Findings consistent with ischemia.  This is a high risk study.  High risk stress nuclear study with two areas of ischemia and normal left ventricular regional and global systolic function. The inferior area of ischemia is small. The anterior area of ischemia is extensive, but appears to be very mild. The septum is spared.  Carotid Doppler 12/11/15: 123456 RICA, 123456 LICA, 123XX123 RECA stenosis  ABI 08/12/15: R 0.81, L 0.89 Diffuse atherosclerosis from the proximal abdominal aorta into both tibial arteries. Several areas of narrowing are noted in both lower extremities, although no focal significant stenosis is identified. One vessel run-off on the right, and three vessel run-off on the left.  ABI 02/03/17: R 0.81, L 0.91 Abnormal lower arterial Doppler study, with waveforms suggestive of inflow disease. The bilateral ABI's are stable and in the mild range. The right great toe pressure is normal. The left great toe pressure is abnormal   Recent Labs: 04/10/2019: BUN 9; Creatinine, Ser 1.24; Potassium 4.4; Sodium 136 05/07/2019: Hemoglobin 13.1; Platelets 225.0    Lipid Panel    Component Value Date/Time   CHOL 112 09/21/2018 1141   TRIG 306 (H) 09/21/2018 1141   HDL 22 (L) 09/21/2018 1141   CHOLHDL 5.1 (H) 09/21/2018 1141   CHOLHDL 3.6 08/12/2017 0334   VLDL 31 08/12/2017 0334   LDLCALC 29 09/21/2018 1141      Wt Readings from Last 3 Encounters:  07/30/19 210 lb 12.8 oz (95.6 kg)  07/12/19 211 lb 9.6 oz (96 kg)  06/18/19  210 lb (95.3 kg)      ASSESSMENT AND PLAN:  # CAD s/p CABG:  # Angina:  Jason Velasquez has severe 3 vessel CAD and all his CABG grafts are occluded with the exception of his LIMA to the LAD.  He underwent successful PCI of the LM, LCx and OM1.  Ostial LAD was 99% stenosed 01/2018 and he had a successful DES implanted.  He is feeling much better and hasn't needed any nitroglycerin.  Continue aspirin, atorvastatin, clopidogrel, Imdur, lisinopril, metoprolol and ranolazine.  Lifelong DAPT.  He was encouraged to increase his cardio.  # Hypertension: Blood pressure was initially elevated but better on repeat.  Continue lisinopril, metoprolol, and isosorbide mononitrate.  # Hyperlipidemia:  Continue atorvastatin and fish oil. LDL goal is less than 70.  He will have lipids rechecked with his PCP in January.  # Claudication: # PAD:  ABIs 0.8 bilaterally and medical management was recommended in 2016.  Unchanged on repeat.  Encouraged ambulation.  Continue aspirin and statin.  Increase walking as above.  # Carotid stenosis: Carotid Dopplers 02/2017 revealed 50-49% R ICA stenosis and 1-39% stenosis on the left.  Continue aspirin and atorvastatin.  Repeat carotid Dopplers ordered for 03/2019.  Current medicines are reviewed at length with the patient today.  The patient does not have concerns regarding medicines.  The following changes have been made:  none  Labs/ tests ordered today include:   Orders Placed This Encounter  Procedures   EKG 12-Lead     Disposition:   FU with Isola Mehlman C. Oval Linsey, MD in 6 months.    Signed, Skeet Latch, MD  07/30/2019 11:34 AM    [place]

## 2019-08-09 MED FILL — LISINOPRIL 10 MG TABS: 10 | 90 days supply | Qty: 90 | Fill #0

## 2019-08-23 MED FILL — JANUVIA 100 MG TABLET: 100 | 90 days supply | Qty: 90 | Fill #2

## 2019-08-28 ENCOUNTER — Encounter: Payer: Self-pay | Admitting: Internal Medicine

## 2019-08-28 ENCOUNTER — Telehealth: Payer: Self-pay | Admitting: Internal Medicine

## 2019-08-28 NOTE — Telephone Encounter (Signed)
Patient requesting a phone call back from a Nurse

## 2019-09-06 ENCOUNTER — Encounter: Payer: PPO | Admitting: Internal Medicine

## 2019-10-02 ENCOUNTER — Other Ambulatory Visit: Payer: Self-pay | Admitting: Cardiovascular Disease

## 2019-10-31 DIAGNOSIS — H2513 Age-related nuclear cataract, bilateral: Secondary | ICD-10-CM | POA: Diagnosis not present

## 2019-11-08 ENCOUNTER — Other Ambulatory Visit: Payer: Self-pay

## 2019-11-08 ENCOUNTER — Ambulatory Visit (INDEPENDENT_AMBULATORY_CARE_PROVIDER_SITE_OTHER): Payer: PPO | Admitting: Internal Medicine

## 2019-11-08 ENCOUNTER — Encounter: Payer: Self-pay | Admitting: Internal Medicine

## 2019-11-08 VITALS — BP 131/90 | HR 76 | Temp 98.1°F | Ht 70.0 in | Wt 213.9 lb

## 2019-11-08 DIAGNOSIS — Z7984 Long term (current) use of oral hypoglycemic drugs: Secondary | ICD-10-CM

## 2019-11-08 DIAGNOSIS — Z7982 Long term (current) use of aspirin: Secondary | ICD-10-CM | POA: Diagnosis not present

## 2019-11-08 DIAGNOSIS — I10 Essential (primary) hypertension: Secondary | ICD-10-CM

## 2019-11-08 DIAGNOSIS — Z79899 Other long term (current) drug therapy: Secondary | ICD-10-CM | POA: Diagnosis not present

## 2019-11-08 DIAGNOSIS — E1151 Type 2 diabetes mellitus with diabetic peripheral angiopathy without gangrene: Secondary | ICD-10-CM | POA: Diagnosis not present

## 2019-11-08 DIAGNOSIS — Z7902 Long term (current) use of antithrombotics/antiplatelets: Secondary | ICD-10-CM

## 2019-11-08 DIAGNOSIS — IMO0002 Reserved for concepts with insufficient information to code with codable children: Secondary | ICD-10-CM

## 2019-11-08 DIAGNOSIS — E119 Type 2 diabetes mellitus without complications: Secondary | ICD-10-CM | POA: Diagnosis not present

## 2019-11-08 DIAGNOSIS — I25119 Atherosclerotic heart disease of native coronary artery with unspecified angina pectoris: Secondary | ICD-10-CM | POA: Diagnosis not present

## 2019-11-08 LAB — GLUCOSE, CAPILLARY: Glucose-Capillary: 123 mg/dL — ABNORMAL HIGH (ref 70–99)

## 2019-11-08 LAB — POCT GLYCOSYLATED HEMOGLOBIN (HGB A1C): Hemoglobin A1C: 7 % — AB (ref 4.0–5.6)

## 2019-11-12 ENCOUNTER — Other Ambulatory Visit: Payer: Self-pay | Admitting: Cardiovascular Disease

## 2019-11-12 MED FILL — LISINOPRIL 10 MG TABS: 10 | 90 days supply | Qty: 90 | Fill #0

## 2019-11-12 NOTE — Progress Notes (Signed)
  Subjective:  HPI: [name] is a 64 y.o. male who presents for f/u HTN, DM  Please see Assessment and Plan below for the status of his chronic medical problems.  Review of Systems: Review of Systems  Constitutional: Negative for fever.  Respiratory: Negative for cough and shortness of breath.   Cardiovascular: Negative for chest pain and claudication.  Musculoskeletal: Negative for myalgias.  Neurological: Negative for dizziness.    Objective:  Physical Exam: Vitals:   11/08/19 1042  BP: 131/90  Pulse: 76  Temp: 98.1 F (36.7 C)  TempSrc: Oral  SpO2: 100%  Weight: 213 lb 14.4 oz (97 kg)  Height: 5\' 10"  (1.778 m)   Body mass index is 30.69 kg/m. Physical Exam Vitals and nursing note reviewed.  Constitutional:      Appearance: Normal appearance.  Cardiovascular:     Rate and Rhythm: Normal rate and regular rhythm.     Pulses:          Dorsalis pedis pulses are 1+ on the right side and 1+ on the left side.       Posterior tibial pulses are 1+ on the right side and 1+ on the left side.  Pulmonary:     Effort: Pulmonary effort is normal.     Breath sounds: Normal breath sounds.  Musculoskeletal:     Right lower leg: No edema.     Left lower leg: No edema.  Neurological:     Mental Status: He is alert.    Assessment & Plan:  See Encounters Tab for problem based charting.  Medications Ordered No orders of the defined types were placed in this encounter.  Other Orders Orders Placed This Encounter  Procedures  . Glucose, capillary  . POCT HgB A1C (CPT [phone])   Follow Up: Return in about 3 months (around 02/05/2020).

## 2019-11-12 NOTE — Assessment & Plan Note (Signed)
HPI: Jason Velasquez reports he is feeling overall pretty well he is not having any issues with any of his medications no orthostasis symptoms.  Taking all his medications as prescribed.  Assessment essential hypertension roughly at goal  Plan Continue metoprolol succinate 200 mg daily Continue lisinopril 10 mg daily Continue Imdur 120 mg daily

## 2019-11-12 NOTE — Assessment & Plan Note (Signed)
HPI: Follows with Dr. Oval Linsey angina currently well controlled with Imdur and Ranexa.  Assessment coronary disease with angina  Plan Continue metoprolol, atorvastatin, ASA, Plavix,  Imdur and Ranexa

## 2019-11-12 NOTE — Assessment & Plan Note (Signed)
HPI: Denies any polyuria or polydipsia no symptomatic hypoglycemia overall feels like he is doing pretty well with his current medications.  Weight overall stable.  Assessment controlled type 2 diabetes mellitus with peripheral vascular disease  Plan Continue Synjardy 02-999 mg twice daily Continue Januvia 100 mg daily Continue atorvastatin 40 mg daily Continue aspirin 81 mg and Plavix 75 mg daily

## 2019-11-28 ENCOUNTER — Other Ambulatory Visit: Payer: Self-pay | Admitting: *Deleted

## 2019-11-30 MED ORDER — SITAGLIPTIN PHOSPHATE 100 MG PO TABS: 100.0000 mg | ORAL_TABLET | Freq: Every day | ORAL | 3 refills | Status: DC

## 2019-11-30 MED ORDER — ATORVASTATIN CALCIUM 40 MG PO TABS: 40.0000 mg | ORAL_TABLET | Freq: Every evening | ORAL | 3 refills | Status: DC

## 2019-12-04 ENCOUNTER — Other Ambulatory Visit: Payer: Self-pay | Admitting: *Deleted

## 2019-12-05 MED ORDER — SITAGLIPTIN PHOSPHATE 100 MG PO TABS: 100.0000 mg | ORAL_TABLET | Freq: Every day | ORAL | 3 refills | Status: DC

## 2019-12-12 MED FILL — JANUVIA 100 MG TABLET: 100 | 90 days supply | Qty: 90 | Fill #0

## 2020-01-08 ENCOUNTER — Other Ambulatory Visit: Payer: Self-pay | Admitting: Cardiovascular Disease

## 2020-01-09 ENCOUNTER — Other Ambulatory Visit: Payer: Self-pay | Admitting: Cardiovascular Disease

## 2020-01-10 ENCOUNTER — Other Ambulatory Visit: Payer: Self-pay

## 2020-01-11 MED ORDER — METOPROLOL SUCCINATE ER 200 MG PO TB24: 200.0000 mg | ORAL_TABLET | Freq: Every day | ORAL | 3 refills | Status: DC

## 2020-01-30 ENCOUNTER — Encounter: Payer: Self-pay | Admitting: Cardiovascular Disease

## 2020-01-30 ENCOUNTER — Other Ambulatory Visit: Payer: Self-pay

## 2020-01-30 ENCOUNTER — Ambulatory Visit: Payer: PPO | Admitting: Cardiovascular Disease

## 2020-01-30 VITALS — BP 106/82 | HR 79 | Ht 70.0 in | Wt 211.0 lb

## 2020-01-30 DIAGNOSIS — Z5181 Encounter for therapeutic drug level monitoring: Secondary | ICD-10-CM | POA: Diagnosis not present

## 2020-01-30 DIAGNOSIS — I10 Essential (primary) hypertension: Secondary | ICD-10-CM | POA: Diagnosis not present

## 2020-01-30 DIAGNOSIS — I739 Peripheral vascular disease, unspecified: Secondary | ICD-10-CM

## 2020-01-30 DIAGNOSIS — E7800 Pure hypercholesterolemia, unspecified: Secondary | ICD-10-CM

## 2020-01-30 LAB — COMPREHENSIVE METABOLIC PANEL
ALT: 30 IU/L (ref 0–44)
AST: 25 IU/L (ref 0–40)
Albumin/Globulin Ratio: 1.8 (ref 1.2–2.2)
Albumin: 4.2 g/dL (ref 3.8–4.8)
Alkaline Phosphatase: 65 IU/L (ref 39–117)
BUN/Creatinine Ratio: 13 (ref 10–24)
BUN: 18 mg/dL (ref 8–27)
Bilirubin Total: 0.3 mg/dL (ref 0.0–1.2)
CO2: 17 mmol/L — ABNORMAL LOW (ref 20–29)
Calcium: 9.1 mg/dL (ref 8.6–10.2)
Chloride: 102 mmol/L (ref 96–106)
Creatinine, Ser: 1.39 mg/dL — ABNORMAL HIGH (ref 0.76–1.27)
GFR calc Af Amer: 61 mL/min/{1.73_m2} (ref >59)
GFR calc non Af Amer: 53 mL/min/{1.73_m2} — ABNORMAL LOW (ref >59)
Globulin, Total: 2.4 g/dL (ref 1.5–4.5)
Glucose: 112 mg/dL — ABNORMAL HIGH (ref 65–99)
Potassium: 4.5 mmol/L (ref 3.5–5.2)
Sodium: 135 mmol/L (ref 134–144)
Total Protein: 6.6 g/dL (ref 6.0–8.5)

## 2020-01-30 LAB — LIPID PANEL
Chol/HDL Ratio: 9 ratio — ABNORMAL HIGH (ref 0.0–5.0)
Cholesterol, Total: 153 mg/dL (ref 100–199)
HDL: 17 mg/dL — ABNORMAL LOW (ref >39)
Triglycerides: 975 mg/dL (ref 0–149)

## 2020-01-30 NOTE — Progress Notes (Signed)
Cardiology Office Note   Date:  01/30/2020   ID:  Jason Velasquez, DOB Oct 14, 1955, MRN [id number]  PCP:  [name], DO  Cardiologist:   Skeet Latch, MD   No chief complaint on file.       Patient ID: Jason Velasquez is a 64 y.o. male with CAD s/p CABG ( LIMA-->LAD, SVG-->PDA, SVG-->OM1), multiple PCIs , chronic stable angina, PAD, hypertension, diabetes and CVA who presents for follow up.  [name] was first evaluated for chest pain on 07/27/15.  He previously underwent CABG in 2007. He had a nuclear stress test on 08/21/15 revealed LVEF 57% with moderate ischemia in the inferoseptal, inferior, and anterior locations. He subsequently underwent left heart catheterization on 08/26/15 that revealed severe three vessel disease including a 90% left main lesion and an occluded RCA and LAD. His LIMA to the LAD was patent but all other vein grafts were occluded. There were no optimal PCI targets.  He was started on carvedilol and Ranexa. [name] also underwent ABI testing 08/2015 that revealed diffuse atherosclerosis from the proximal abdominal aorta through the common and external iliac arteries bilaterally. There was no detectable flow in the L peroneal or anterior tibial arteries. ABIs were near 0.8 bilaterally. He was referred to Dr. Gwenlyn Velasquez where medical management was advised. He was noted to have carotid bruits and was referred for carotid ultrasound on 12/09/15.  This revealed 40-59% stenosis on the right and 1-39% stenosis on the left.  He had repeat LE Dopplers and ABIs 03/2017 that were unchanged from prior.  Medical management was recommended.    [name] continued to have worsening angina despite optimal medical therapy.  He was referred for [facility]  05/2017 where he was Velasquez to have progression of the distal LM/LAD/LCx lesion.  He underwent DES placement in the ostial LCx, mid LCx, and ostial LAD.  The ostial LAD was treated with a cutting ballpon reducing the stenosis from 85% to 30%.   It was recommended that he continue clopidogrel and aspirin indefinitely.  He had an echo 06/01/17 that showed LVEF 65-70% with grade 1 diastolic dysfunction.  He followed up on 07/2017 and continued to have angina especially at night.  There is some concern that it may also be GERD.  Therefore he was given a trial of pantoprazole and Imdur 60 mg daily was restarted.  He called our office 11/7 with progressive symptoms.  He was referred for cardiac catheterization but was Velasquez to be anemic with a hemoglobin of 7.7.  He was transfused and underwent upper endoscopy on 11/9.  He was Velasquez to have a hiatal hernia and 2 AVMs that were treated with APC.  He also received IV iron.  He subsequently underwent left heart catheterization that again showed severe three-vessel disease not amenable to PCI.  He continues to have refractory angina despite optimal medical therapy.  He had another left heart catheterization 01/25/2018 at which time his ostial LAD was Velasquez to be 99% stenosed.  A Sierra DES was successfully implanted.  Since his last appointment [name] has been doing well.  He has been feeling well.  He has very slight chest discomfort at times.  It is barely noticeable.  It happens once every week or 2.  It last for about 10 or 15 minutes and does not seem to be associated with exertion.  He denies any shortness of breath.  He is trying to get back into the routine of exercising.  He  plans to get a bike and also start his membership to [employer] through the [employer] program.  He has no lower extremity edema, orthopnea, or PND.  He does get some pain in his legs when he is rushing around.  He had both Covid vaccinations.   Past Medical History:  Diagnosis Date  . Angina, class III ([place]) 08/24/2015  . Arthritis    "hands" (01/25/2018)  . AVM (arteriovenous malformation) 08/15/2017  . CAD (coronary artery disease) of artery bypass graft    CABG- 2007   . Carotid stenosis 03/03/2016   [id number]  RICA, 123456 LICA, [id number] RECA stenosis Repeat 12/2016.  . Claudication ([place])   . Controlled type 2 diabetes mellitus without complication ([place]) 123XX123   Dx March of 2016. GAD65 negative Anti Islet cell negative    . DKA (diabetic ketoacidoses) ([place]) 12/07/2014  . Dyspnea   . History of blood transfusion ~ 08/2017   "related to LGIB"  . History of hiatal hernia   . Hx of adenomatous colonic polyps 05/27/2015  . Hypercholesterolemia   . Hypertension   . Hypertensive heart disease 03/03/2016  . 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 Left nephrolithiasis 12/07/2014  . Migraine    "only in my 30's" (01/25/2018)  . Non-ST elevation (NSTEMI) myocardial infarction ([place])   . Pneumonia ~ 1990  . Stroke [facility]) 07/12/2014   "I didn't realize I'd had one; hospital Velasquez it"; denies residual on 01/25/2018  . Tubular adenoma of colon 05/31/2015   Colonoscopy 05/20/15 with [name]: 3 tubullar adenoma's largest 2.3cm.  Plan for repeat Colonoscopy in 2019.    Past Surgical History:  Procedure Laterality Date  . CARDIAC CATHETERIZATION N/A 08/26/2015   Procedure: Left Heart Cath and Cors/Grafts Angiography;  Surgeon: [name], MD;  Location: [place] CV LAB;  Service: Cardiovascular;  Laterality: N/A;  . CORONARY ARTERY BYPASS GRAFT  2007   "CABG X3", at [facility]   . CORONARY BALLOON ANGIOPLASTY N/A 05/25/2017   Procedure: CORONARY BALLOON ANGIOPLASTY;  Surgeon: [name], MD;  Location: [place] [facility] LAB;  Service: Cardiovascular;  Laterality: N/A;  . CORONARY BALLOON ANGIOPLASTY N/A 01/25/2018   Procedure: CORONARY BALLOON ANGIOPLASTY;  Surgeon: [name], MD;  Location: [place] CV LAB;  Service: Cardiovascular;  Laterality: N/A;  . CORONARY STENT INTERVENTION N/A 05/25/2017   Procedure: CORONARY STENT INTERVENTION;  Surgeon: [name], MD;  Location: [place] CV LAB;  Service: Cardiovascular;  Laterality: N/A;   . CORONARY STENT INTERVENTION N/A 01/25/2018   Procedure: CORONARY STENT INTERVENTION;  Surgeon: [name], MD;  Location: [place] CV LAB;  Service: Cardiovascular;  Laterality: N/A;  . ESOPHAGOGASTRODUODENOSCOPY N/A 08/12/2017   Procedure: ESOPHAGOGASTRODUODENOSCOPY (EGD);  Surgeon: Jerene Bears, MD;  Location: [facility] ENDOSCOPY;  Service: Gastroenterology;  Laterality: N/A;  . LEFT HEART CATH AND CORS/GRAFTS ANGIOGRAPHY N/A 05/23/2017   Procedure: LEFT HEART CATH AND CORS/GRAFTS ANGIOGRAPHY;  Surgeon: [name], MD;  Location: [place] CV LAB;  Service: Cardiovascular;  Laterality: N/A;  . LEFT HEART CATH AND CORS/GRAFTS ANGIOGRAPHY N/A 08/15/2017   Procedure: LEFT HEART CATH AND CORS/GRAFTS ANGIOGRAPHY;  Surgeon: Burnell Blanks, MD;  Location: [place] [facility] LAB;  Service: Cardiovascular;  Laterality: N/A;  . LEFT HEART CATH AND CORS/GRAFTS ANGIOGRAPHY N/A 01/25/2018   Procedure: LEFT HEART CATH AND CORS/GRAFTS ANGIOGRAPHY;  Surgeon: [name], MD;  Location: [place] CV  LAB;  Service: Cardiovascular;  Laterality: N/A;    Current Outpatient Medications  Medication Sig Dispense Refill  . acetaminophen (TYLENOL) 500 MG tablet Take 1,000 mg by mouth every 6 (six) hours as needed for moderate pain or headache.    Marland Kitchen aspirin EC 81 MG tablet Take 81 mg by mouth daily.    Marland Kitchen atorvastatin (LIPITOR) 40 MG tablet Take 1 tablet (40 mg total) by mouth every evening. 90 tablet 3  . clopidogrel (PLAVIX) 75 MG tablet Take 1 tablet (75 mg total) by mouth daily. 90 tablet 1  . Empagliflozin-metFORMIN HCl (SYNJARDY) 02-999 MG TABS Take 1 tablet by mouth 2 (two) times daily with a meal. 180 tablet 1  . Hypromellose (ARTIFICIAL TEARS OP) Place 1 drop into both eyes daily as needed (for dry eyes).     . isosorbide mononitrate (IMDUR) 120 MG 24 hr tablet TAKE 1/2 (ONE-HALF) TABLET BY MOUTH IN THE MORNING AND 1 TABLET IN THE EVENING 135 tablet 1  . Lancets MISC 1 Device by Does not  apply route 4 (four) times daily -  before meals and at bedtime. 100 each 11  . lisinopril (ZESTRIL) 10 MG tablet TAKE 1 TABLET BY MOUTH DAILY. 90 tablet 2  . metoprolol (TOPROL-XL) 200 MG 24 hr tablet Take 1 tablet (200 mg total) by mouth daily. 90 tablet 3  . nitroGLYCERIN (NITROSTAT) 0.4 MG SL tablet Place 1 tablet (0.4 mg total) under the tongue every 5 (five) minutes as needed for chest pain. 25 tablet 2  . ranolazine (RANEXA) 1000 MG SR tablet Take 500 mg by mouth 2 (two) times daily. TAKE 1/2 TABLET BY MOUTH TWICE A DAY     . sitaGLIPtin (JANUVIA) 100 MG tablet Take 1 tablet (100 mg total) by mouth daily. 90 tablet 3  . ferrous sulfate 325 (65 FE) MG tablet Take 1 tablet (325 mg total) by mouth every other day. 100 tablet 1   No current facility-administered medications for this visit.    Allergies:   Patient has no known allergies.    Social History:  The patient  reports that he quit smoking about 10 years ago. His smoking use included cigarettes. He has a 76.00 pack-year smoking history. He has never used smokeless tobacco. He reports current alcohol use. He reports that he does not use drugs.   Family History:  The patient's family history includes Crohn's disease in his daughter; Diabetes in his father; Hypertension in his mother.    ROS:  Please see the history of present illness.  Otherwise, review of systems are positive for none.   All other systems are reviewed and negative.    PHYSICAL EXAM: VS:  BP 106/82   Pulse 79   Ht 5\' 10"  (1.778 m)   Wt 211 lb (95.7 kg)   SpO2 97%   BMI 30.28 kg/m  , BMI Body mass index is 30.28 kg/m. GENERAL:  Well appearing HEENT: Pupils equal round and reactive, fundi not visualized, oral mucosa unremarkable NECK:  No jugular venous distention, waveform within normal limits, carotid upstroke brisk and symmetric, + carotid bruits LUNGS:  Clear to auscultation bilaterally HEART:  RRR.  PMI not displaced or sustained,S1 and S2 within normal  limits, no S3, no S4, no clicks, no rubs, no murmurs ABD:  Flat, positive bowel sounds normal in frequency in pitch, no bruits, no rebound, no guarding, no midline pulsatile mass, no hepatomegaly, no splenomegaly EXT:  2 plus pulses throughout, no edema, no cyanosis no clubbing  SKIN:  No rashes no nodules NEURO:  Cranial nerves II through XII grossly intact, motor grossly intact throughout PSYCH:  Cognitively intact, oriented to person place and time   EKG:  EKG is ordered today. The ekg ordered today demonstrates sinus rhythm rate 82 bpm.  Prior inferior infarct.  07/02/16: Sinus rhythm rate 75 bpm.  Prior inferior infarct  12/29/16: Sinus rhythm.  Incomplete RBBB.  Prior inferior infarct.  05/18/17: Sinus rhythm. Rate 87 bpm. B 1 PVCs. 07/18/17: Sinus rhythm.  Rate 71 bpm.  08/11/17: Sinus rhythm.  Rate 77 bpm.    07/30/19: Sinus rhythm.  Rate 71 bpm.  Prior inferior infarct 01/30/2020: Sinus rhythm.  Rate 79 bpm.  Prior inferior infarct.  LHC 08/15/17:  Mid RCA lesion is 100% stenosed.  Ost RCA to Prox RCA lesion is 70% stenosed.  Ost 1st Mrg lesion is 60% stenosed.  Previously placed Ost 1st Mrg to 1st Mrg stent (unknown type) is widely patent.  Origin lesion is 100% stenosed.  Origin lesion is 100% stenosed.  Prox Graft lesion is 20% stenosed.  Prox LAD to Mid LAD lesion is 100% stenosed.  Previously placed Ost Cx to Prox Cx stent (unknown type) is widely patent.  Previously placed Ost LM to Dist LM stent (unknown type) is widely patent.  Previously placed Prox Cx to Mid Cx stent (unknown type) is widely patent.  SVG graft was not visualized.  SVG graft was not visualized.  Post intervention, there is a -1% residual stenosis.  LIMA graft was visualized by angiography and is normal in caliber.  The graft exhibits minimal luminal irregularities.  Post intervention, there is a -1% residual stenosis.  Ost LAD to Prox LAD lesion is 99% stenosed.  Post intervention,  there is a -1% residual stenosis.   1. Severe triple vessel CAD s/p CABG with 1/3 patent bypass grafts.  2. The LAD is known to be chronically occluded in the mid segment. The mid and distal LAD fills from the patent LIMA graft. The ostial LAD is jailed by the stent extending from the left main into the Circumflex. The proximal LAD has a 99% stenosis following by an aneurysmal segment then a total occlusion.  3. The left main stent is patent 4. The ostial/proximal Circumflex stent is patent. The first OM stent is patent. There is a 60% ostial stenosis in the first OM branch just prior to the stent. The SVG to the OM is known to be occluded. The mid Circumflex stent is patent.  5. The native RCA has a long 70% proximal stenosis and 100% mid chronic occlusion. The vein graft the to distal RCA is known to be occluded and was not injected.  6. Normal filling pressures  Recommendations: Continue medical management of CAD. No focal targets for PCI. The proximal LAD stenosis is severe but the majority of this territory is supplied by the LIMA graft and the LAD is jailed by the LM/Circ stent. I suspect that his anemia has contributed to his recent chest pain given his severe, diffuse CAD.    Echo 06/01/17: Study Conclusions  - Procedure narrative: Transthoracic echocardiography. Image   quality was poor. Intravenous contrast (Definity) was   administered. - Left ventricle: The cavity size was normal. There was mild focal   basal hypertrophy of the septum. Systolic function was vigorous.   The estimated ejection fraction was in the range of 65% to 70%.   Wall motion was normal; there were no regional wall motion  abnormalities. Doppler parameters are consistent with abnormal   left ventricular relaxation (grade 1 diastolic dysfunction). - Aortic valve: Trileaflet; mildly thickened, mildly calcified   leaflets. - Left atrium: The atrium was mildly dilated.   Lexiscan Cardiolite  08/21/15:  Nuclear stress EF: 57%.  The left ventricular ejection fraction is normal (55-65%).  There was no ST segment deviation noted during stress.  No T wave inversion was noted during stress.  Defect 1: There is a small defect of moderate severity present in the basal inferoseptal and basal inferior location.  Defect 2: There is a defect present in the basal anterior, mid anterior and apical anterior location.  Findings consistent with ischemia.  This is a high risk study.  High risk stress nuclear study with two areas of ischemia and normal left ventricular regional and global systolic function. The inferior area of ischemia is small. The anterior area of ischemia is extensive, but appears to be very mild. The septum is spared.  Carotid Doppler 12/11/15: 123456 RICA, 123456 LICA, [id number] RECA stenosis  ABI 08/12/15: R 0.81, L 0.89 Diffuse atherosclerosis from the proximal abdominal aorta into both tibial arteries. Several areas of narrowing are noted in both lower extremities, although no focal significant stenosis is identified. One vessel run-off on the right, and three vessel run-off on the left.  ABI 02/03/17: R 0.81, L 0.91 Abnormal lower arterial Doppler study, with waveforms suggestive of inflow disease. The bilateral ABI's are stable and in the mild range. The right great toe pressure is normal. The left great toe pressure is abnormal   Recent Labs: 04/10/2019: BUN 9; Creatinine, Ser 1.24; Potassium 4.4; Sodium 136 05/07/2019: Hemoglobin 13.1; Platelets 225.0    Lipid Panel    Component Value Date/Time   CHOL 112 09/21/2018 1141   TRIG 306 (H) 09/21/2018 1141   HDL 22 (L) 09/21/2018 1141   CHOLHDL 5.1 (H) 09/21/2018 1141   CHOLHDL 3.6 08/12/2017 0334   VLDL 31 08/12/2017 0334   LDLCALC 29 09/21/2018 1141      Wt Readings from Last 3 Encounters:  01/30/20 211 lb (95.7 kg)  11/08/19 213 lb 14.4 oz (97 kg)  07/30/19 210 lb 12.8 oz (95.6 kg)       ASSESSMENT AND PLAN:  # CAD s/p CABG:  # Angina:  Mr. Harpenau has severe 3 vessel CAD and all his CABG grafts are occluded with the exception of his LIMA to the LAD.  He underwent successful PCI of the LM, LCx and OM1.  Ostial LAD was 99% stenosed 01/2018 and he had a successful DES implanted 01/2018.  He continues to do well.  Continue aspirin, atorvastatin, clopidogrel, Imdur, lisinopril, metoprolol and ranolazine.  Lifelong DAPT.  He was encouraged to start back exercising.  # Hypertension: Blood pressure well have been controlled.  # Hyperlipidemia: Check fasting lipids and CMP today.  # Claudication: # PAD:  ABIs 0.8 bilaterally and medical management was recommended in 2016.  Unchanged on repeat.  He is having more claudication.  We will repeat ABIs.  Lipid management and antiplatelets as above.  # Carotid stenosis: Carotid Dopplers 02/2017 revealed 50-49% R ICA stenosis and 1-39% stenosis on the left.  Continue aspirin and atorvastatin.  Repeat carotid Dopplers ordered for 03/2019.  Current medicines are reviewed at length with the patient today.  The patient does not have concerns regarding medicines.  The following changes have been made:  none  Labs/ tests ordered today include:   Orders Placed This Encounter  Procedures  .  Lipid panel  . Comprehensive metabolic panel  . EKG 12-Lead  . VAS [place] ABI WITH/WO TBI  . VAS [place] LOWER EXTREMITY ARTERIAL DUPLEX     Disposition:   FU with Xaivier Malay C. Oval Linsey, MD in 6 months.    Signed, Skeet Latch, MD  01/30/2020 11:56 AM    [place]

## 2020-01-30 NOTE — Patient Instructions (Signed)
Medication Instructions:  Your physician recommends that you continue on your current medications as directed. Please refer to the Current Medication list given to you today.  *If you need a refill on your cardiac medications before your next appointment, please call your pharmacy*   Lab Work: LP/CMET TODAY  If you have labs (blood work) drawn today and your tests are completely normal, you will receive your results only by: Marland Kitchen MyChart Message (if you have MyChart) OR . A paper copy in the mail If you have any lab test that is abnormal or we need to change your treatment, we will call you to review the results.   Testing/Procedures: Your physician has requested that you have an ankle brachial index (ABI). During this test an ultrasound and blood pressure cuff are used to evaluate the arteries that supply the arms and legs with blood. Allow thirty minutes for this exam. There are no restrictions or special instructions.   Your physician has requested that you have a carotid duplex. This test is an ultrasound of the carotid arteries in your neck. It looks at blood flow through these arteries that supply the brain with blood. Allow one hour for this exam. There are no restrictions or special instructions.  Follow-Up: At [facility], you and your health needs are our priority.  As part of our continuing mission to provide you with exceptional heart care, we have created designated Provider Care Teams.  These Care Teams include your primary Cardiologist (physician) and Advanced Practice Providers (APPs -  Physician Assistants and Nurse Practitioners) who all work together to provide you with the care you need, when you need it.  We recommend signing up for the patient portal called "MyChart".  Sign up information is provided on this After Visit Summary.  MyChart is used to connect with patients for Virtual Visits (Telemedicine).  Patients are able to view lab/test results, encounter notes,  upcoming appointments, etc.  Non-urgent messages can be sent to your provider as well.   To learn more about what you can do with MyChart, go to [email or web address].    Your next appointment:   6 month(s) You will receive a reminder letter in the mail two months in advance. If you don't receive a letter, please call our office to schedule the follow-up appointment.  The format for your next appointment:   In Person  Provider:   You may see Skeet Latch, MD or one of the following Advanced Practice Providers on your designated Care Team:    Kerin Ransom, PA-C  [place], [place]  [name], [place]

## 2020-02-08 ENCOUNTER — Ambulatory Visit (HOSPITAL_BASED_OUTPATIENT_CLINIC_OR_DEPARTMENT_OTHER)
Admission: RE | Admit: 2020-02-08 | Discharge: 2020-02-08 | Disposition: A | Discharge status: Home / Self Care | Payer: PPO | Source: Ambulatory Visit | Attending: Cardiovascular Disease | Admitting: Cardiovascular Disease

## 2020-02-08 ENCOUNTER — Other Ambulatory Visit: Payer: Self-pay

## 2020-02-08 ENCOUNTER — Ambulatory Visit (HOSPITAL_COMMUNITY)
Admission: RE | Admit: 2020-02-08 | Discharge: 2020-02-08 | Disposition: A | Discharge status: Home / Self Care | Payer: PPO | Source: Ambulatory Visit | Attending: Cardiology | Admitting: Cardiology

## 2020-02-08 DIAGNOSIS — I739 Peripheral vascular disease, unspecified: Secondary | ICD-10-CM | POA: Insufficient documentation

## 2020-02-08 DIAGNOSIS — I6523 Occlusion and stenosis of bilateral carotid arteries: Secondary | ICD-10-CM

## 2020-02-14 ENCOUNTER — Ambulatory Visit (INDEPENDENT_AMBULATORY_CARE_PROVIDER_SITE_OTHER): Payer: PPO | Admitting: Internal Medicine

## 2020-02-14 ENCOUNTER — Other Ambulatory Visit: Payer: Self-pay

## 2020-02-14 ENCOUNTER — Encounter: Payer: Self-pay | Admitting: Internal Medicine

## 2020-02-14 ENCOUNTER — Telehealth: Payer: Self-pay | Admitting: *Deleted

## 2020-02-14 VITALS — BP 121/71 | HR 72 | Temp 98.6°F | Ht 70.0 in | Wt 211.4 lb

## 2020-02-14 DIAGNOSIS — E782 Mixed hyperlipidemia: Secondary | ICD-10-CM | POA: Diagnosis not present

## 2020-02-14 DIAGNOSIS — M5412 Radiculopathy, cervical region: Secondary | ICD-10-CM | POA: Insufficient documentation

## 2020-02-14 DIAGNOSIS — E1165 Type 2 diabetes mellitus with hyperglycemia: Secondary | ICD-10-CM | POA: Diagnosis not present

## 2020-02-14 DIAGNOSIS — I10 Essential (primary) hypertension: Secondary | ICD-10-CM | POA: Diagnosis not present

## 2020-02-14 DIAGNOSIS — I25119 Atherosclerotic heart disease of native coronary artery with unspecified angina pectoris: Secondary | ICD-10-CM | POA: Diagnosis not present

## 2020-02-14 DIAGNOSIS — IMO0002 Reserved for concepts with insufficient information to code with codable children: Secondary | ICD-10-CM

## 2020-02-14 DIAGNOSIS — E785 Hyperlipidemia, unspecified: Secondary | ICD-10-CM

## 2020-02-14 DIAGNOSIS — E1151 Type 2 diabetes mellitus with diabetic peripheral angiopathy without gangrene: Secondary | ICD-10-CM

## 2020-02-14 DIAGNOSIS — E7800 Pure hypercholesterolemia, unspecified: Secondary | ICD-10-CM

## 2020-02-14 DIAGNOSIS — I6523 Occlusion and stenosis of bilateral carotid arteries: Secondary | ICD-10-CM

## 2020-02-14 DIAGNOSIS — Z79899 Other long term (current) drug therapy: Secondary | ICD-10-CM

## 2020-02-14 DIAGNOSIS — I739 Peripheral vascular disease, unspecified: Secondary | ICD-10-CM | POA: Diagnosis not present

## 2020-02-14 LAB — POCT GLYCOSYLATED HEMOGLOBIN (HGB A1C): Hemoglobin A1C: 6.6 % — AB (ref 4.0–5.6)

## 2020-02-14 LAB — GLUCOSE, CAPILLARY: Glucose-Capillary: 151 mg/dL — ABNORMAL HIGH (ref 70–99)

## 2020-02-14 MED ORDER — ONETOUCH DELICA PLUS LANCET33G MISC: 1.0000 | Freq: Two times a day (BID) | 11 refills | Status: DC

## 2020-02-14 MED ORDER — ATORVASTATIN CALCIUM 80 MG PO TABS: 80.0000 mg | ORAL_TABLET | Freq: Every evening | ORAL | 3 refills | Status: DC

## 2020-02-14 MED ORDER — ICOSAPENT ETHYL 1 G PO CAPS
2.0000 g | ORAL_CAPSULE | Freq: Two times a day (BID) | ORAL | 3 refills | Status: DC
Start: 2020-02-14 — End: 2020-10-13

## 2020-02-14 MED ORDER — ONETOUCH VERIO VI STRP: ORAL_STRIP | 11 refills | Status: DC

## 2020-02-14 NOTE — Telephone Encounter (Signed)
Advised patient, orders placed for 1 year recheck, Rx sent to pharmacy, and referral for Dr Debara Pickett placed

## 2020-02-14 NOTE — Telephone Encounter (Signed)
-----   Message from Skeet Latch, MD sent at 02/13/2020 11:06 PM EDT ----- Unchanged.  Repeat in 1 year.

## 2020-02-14 NOTE — Assessment & Plan Note (Signed)
Only saw Dr. Oval Linsey lipid panel there was notable for increased triglycerides.  It appears her plan was to increase atorvastatin to 80 mg and possibly start Vascepa.  Medications he brings today only include his 40 mg of atorvastatin and he has not heard anything about his vascepa.  I am not sure this is well covered by his insurance as I could not find it easily on the formulary he is concerned about cost therefore today I will go ahead and increase his atorvastatin to 80 mg.  And I will recheck a lipid panel and discuss with Dr. Oval Linsey if she would still like him to start Vascepa

## 2020-02-14 NOTE — Assessment & Plan Note (Signed)
HPI: Reports he is doing very well with his glycemic management.  Denies any hypoglycemia.  He is checking his sugars usually twice a day with a ReliOn meter we cannot download this 1 but his 14-day average is 159.  Assessment controlled type 2 diabetes mellitus with peripheral vascular disease  Plan I will continue him on Synjardy 02-999 twice daily Continue Januvia 100 mg daily I have provided him with a new meter and orders for test strips

## 2020-02-14 NOTE — Assessment & Plan Note (Signed)
HPI: No complaints overall feeling well.  Assessment essential hypertension well-controlled  Plan We will continue his current medications Lisinopril 10 mg daily Imdur 120 mg daily

## 2020-02-14 NOTE — Telephone Encounter (Signed)
-----   Message from Skeet Latch, MD sent at 02/13/2020 11:06 PM EDT ----- Moderate blockage on the R, mild on the left.  Unchanged from prior.

## 2020-02-14 NOTE — Assessment & Plan Note (Signed)
HPI: He brings to my attention today that he has been having some feelings of numbness and tingling that go down his forearm to his right thumb.  It is not constant intermittent not sure what provokes it.  Not particular bothersome.  Assessment C6 radiculopathy likely degenerative disc disease  Plan Discussed we should monitor this for now I do not think he has any findings that would need urgent imaging.

## 2020-02-14 NOTE — Patient Instructions (Signed)
I am increasing your Atorvastatin to 80mg , this means you can double up on your 40mg  tablets until your next refill.

## 2020-02-14 NOTE — Assessment & Plan Note (Signed)
HPI: He has been following with Dr. Oval Linsey.  Angina symptoms are actually better controlled now.  His Ranexa has been decreased to 500 mg twice daily he continues on Imdur and metoprolol.  Assessment coronary artery disease with angina  Plan Continue current medications.

## 2020-02-14 NOTE — Assessment & Plan Note (Signed)
Following with cardiology had repeat imaging recently stable follow-up in 1 year.

## 2020-02-14 NOTE — Progress Notes (Signed)
  Subjective:  HPI: Jason Velasquez is a 64 y.o. male who presents for f/u T2DM  Please see Assessment and Plan below for the status of his chronic medical problems.  Objective:  Physical Exam: Vitals:   02/14/20 1022  BP: 121/71  Pulse: 72  Temp: 98.6 F (37 C)  TempSrc: Oral  SpO2: 98%  Weight: 211 lb 6.4 oz (95.9 kg)  Height: 5\' 10"  (1.778 m)   Body mass index is 30.33 kg/m. Physical Exam Vitals and nursing note reviewed.  Constitutional:      Appearance: Normal appearance.  Cardiovascular:     Rate and Rhythm: Normal rate and regular rhythm.  Pulmonary:     Effort: Pulmonary effort is normal.  Musculoskeletal:     Comments: spurling test positive with reproduction of symptoms of right C6 dermatone  Neurological:     Mental Status: He is alert.    Assessment & Plan:  See Encounters Tab for problem based charting.  Medications Ordered Meds ordered this encounter  Medications  . glucose blood (ONETOUCH VERIO) test strip    Sig: Use as instructed    Dispense:  100 each    Refill:  11    The patient is not insulin requiring, ICD 10 code E11.65. The patient tests 2 times per day.  . Lancets (ONETOUCH DELICA PLUS 123XX123) MISC    Sig: 1 Device by Does not apply route in the morning and at bedtime.    Dispense:  100 each    Refill:  11    The patient is not insulin requiring, ICD 10 code E11.65. The patient tests 2 times per day.  Marland Kitchen atorvastatin (LIPITOR) 80 MG tablet    Sig: Take 1 tablet (80 mg total) by mouth every evening.    Dispense:  90 tablet    Refill:  3   Other Orders Orders Placed This Encounter  Procedures  . BMP8+Anion Gap  . Glucose, capillary  . Lipid Profile  . POC Hbg A1C   Follow Up: Return in about 3 months (around 05/16/2020).

## 2020-02-14 NOTE — Telephone Encounter (Signed)
-----   Message from Skeet Latch, MD sent at 02/06/2020  2:31 PM EDT ----- Triglycerides are extremely elevated.  Did something change with his diet?  Increase atorvastatin to 80 mg.  Please have him see Dr. Debara Pickett and advanced lipid clinic. Start Vascepa 2g bid.

## 2020-02-14 NOTE — Assessment & Plan Note (Signed)
This was recently reimaged and stable will need repeat imaging in 1 year.

## 2020-02-15 LAB — BMP8+ANION GAP
Anion Gap: 15 mmol/L (ref 10.0–18.0)
BUN/Creatinine Ratio: 11 (ref 10–24)
BUN: 12 mg/dL (ref 8–27)
CO2: 20 mmol/L (ref 20–29)
Calcium: 9.7 mg/dL (ref 8.6–10.2)
Chloride: 100 mmol/L (ref 96–106)
Creatinine, Ser: 1.13 mg/dL (ref 0.76–1.27)
GFR calc Af Amer: 78 mL/min/{1.73_m2} (ref >59)
GFR calc non Af Amer: 68 mL/min/{1.73_m2} (ref >59)
Glucose: 145 mg/dL — ABNORMAL HIGH (ref 65–99)
Potassium: 4.6 mmol/L (ref 3.5–5.2)
Sodium: 135 mmol/L (ref 134–144)

## 2020-02-15 LAB — LIPID PANEL
Chol/HDL Ratio: 7.4 ratio — ABNORMAL HIGH (ref 0.0–5.0)
Cholesterol, Total: 134 mg/dL (ref 100–199)
HDL: 18 mg/dL — ABNORMAL LOW (ref >39)
LDL Chol Calc (NIH): 32 mg/dL (ref 0–99)
Triglycerides: 613 mg/dL (ref 0–149)
VLDL Cholesterol Cal: 84 mg/dL — ABNORMAL HIGH (ref 5–40)

## 2020-02-16 ENCOUNTER — Other Ambulatory Visit: Payer: Self-pay | Admitting: Cardiovascular Disease

## 2020-02-25 MED FILL — LISINOPRIL 10 MG TABS: 10 | 90 days supply | Qty: 90 | Fill #1

## 2020-03-25 MED FILL — JANUVIA 100 MG TABLET: 100 | 90 days supply | Qty: 90 | Fill #1

## 2020-04-14 ENCOUNTER — Telehealth (INDEPENDENT_AMBULATORY_CARE_PROVIDER_SITE_OTHER): Payer: PPO | Admitting: Internal Medicine

## 2020-04-14 ENCOUNTER — Encounter: Payer: Self-pay | Admitting: Internal Medicine

## 2020-04-14 VITALS — BP 120/73 | Wt 210.0 lb

## 2020-04-14 DIAGNOSIS — I739 Peripheral vascular disease, unspecified: Secondary | ICD-10-CM | POA: Diagnosis not present

## 2020-04-14 DIAGNOSIS — E782 Mixed hyperlipidemia: Secondary | ICD-10-CM | POA: Diagnosis not present

## 2020-04-14 DIAGNOSIS — I2510 Atherosclerotic heart disease of native coronary artery without angina pectoris: Secondary | ICD-10-CM | POA: Diagnosis not present

## 2020-04-14 NOTE — Patient Instructions (Signed)
Medication Instructions:  Your physician recommends that you continue on your current medications as directed. Please refer to the Current Medication list given to you today. ** any changes to therapy will be based on lab results  *If you need a refill on your cardiac medications before your next appointment, please call your pharmacy*   Lab Work: FASTING lipid panel   If you have labs (blood work) drawn today and your tests are completely normal, you will receive your results only by: Marland Kitchen MyChart Message (if you have MyChart) OR . A paper copy in the mail If you have any lab test that is abnormal or we need to change your treatment, we will call you to review the results.   Testing/Procedures: NONE   Follow-Up: At [facility], you and your health needs are our priority.  As part of our continuing mission to provide you with exceptional heart care, we have created designated Provider Care Teams.  These Care Teams include your primary Cardiologist (physician) and Advanced Practice Providers (APPs -  Physician Assistants and Nurse Practitioners) who all work together to provide you with the care you need, when you need it.  We recommend signing up for the patient portal called "MyChart".  Sign up information is provided on this After Visit Summary.  MyChart is used to connect with patients for Virtual Visits (Telemedicine).  Patients are able to view lab/test results, encounter notes, upcoming appointments, etc.  Non-urgent messages can be sent to your provider as well.   To learn more about what you can do with MyChart, go to [email or web address].    Your next appointment:   3-4 month(s)  The format for your next appointment:   In Person or Virtual  Provider:   Raliegh Ip Mali Hilty, MD   Other Instructions

## 2020-04-14 NOTE — Progress Notes (Signed)
Virtual Visit via Telephone Note   This visit type was conducted due to national recommendations for restrictions regarding the COVID-19 Pandemic (e.g. social distancing) in an effort to limit this patient's exposure and mitigate transmission in our community.  Due to his co-morbid illnesses, this patient is at least at moderate risk for complications without adequate follow up.  This format is felt to be most appropriate for this patient at this time.  The patient did not have access to video technology/had technical difficulties with video requiring transitioning to audio format only (telephone).  All issues noted in this document were discussed and addressed.  No physical exam could be performed with this format.  Please refer to the patient's chart for his  consent to telehealth for [facility].   Evaluation Performed:  Telephone consult  Date:  04/14/2020   ID:  Jason Velasquez, DOB 1956-06-30, MRN [id number]  Patient Location:  [address] [place] [address]  Provider location:   [address] [place], [place] [address]  PCP:  [name], DO  Cardiologist:  Skeet Latch, MD Electrophysiologist:  None   Chief Complaint:  High triglycerides  History of Present Illness:    Jason Velasquez is a 64 y.o. male who presents via audio/video conferencing for a telehealth visit today.  Jason Velasquez is a complex 64 year old male with coronary artery disease status post three-vessel CABG, multiple PCI's, PAD, type 2 diabetes and prior stroke who is kindly referred to me for additional recommendations for management of dyslipidemia.  Over the past year his triglycerides have skyrocketed with has triglycerides that were close to 1000.  This did not seem to track with his hemoglobin A1c which was just over 7.0, however recently the A1c is lower at 6.6.  He reports making some dietary changes.  In addition down from 975-613 over the period of a month and subsequently he was  started on Vascepa.  He has not had repeat lipid testing since then but reports compliance with the medication 2 g twice daily.  Patiently he is on atorvastatin 80 mg daily and has a low LDL cholesterol 35, however has a significantly low HDL cholesterol of 18 which is primarily his biggest risk factor my opinion.  The patient does not have symptoms concerning for COVID-19 infection (fever, chills, cough, or new SHORTNESS OF BREATH).    Prior CV studies:   The following studies were reviewed today:  Reviewed, lab work  PMHx:  Past Medical History:  Diagnosis Date  . Angina, class III ([place]) 08/24/2015  . Arthritis    "hands" (01/25/2018)  . AVM (arteriovenous malformation) 08/15/2017  . CAD (coronary artery disease) of artery bypass graft    CABG- 2007   . Carotid stenosis 03/03/2016   02-54% RICA, 2-70% LICA, >62% RECA stenosis Repeat 12/2016.  . Claudication ([place])   . Controlled type 2 diabetes mellitus without complication ([place]) 12/08/6281   Dx March of 2016. GAD65 negative Anti Islet cell negative    . DKA (diabetic ketoacidoses) ([place]) 12/07/2014  . Dyspnea   . History of blood transfusion ~ 08/2017   "related to LGIB"  . History of hiatal hernia   . Hx of adenomatous colonic polyps 05/27/2015  . Hypercholesterolemia   . Hypertension   . Hypertensive heart disease 03/03/2016  . 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 Left nephrolithiasis 12/07/2014  . Migraine    "only in my 30's" (01/25/2018)  .  Non-ST elevation (NSTEMI) myocardial infarction ([place])   . Pneumonia ~ 1990  . Stroke [facility]) 07/12/2014   "I didn't realize I'd had one; hospital found it"; denies residual on 01/25/2018  . Tubular adenoma of colon 05/31/2015   Colonoscopy 05/20/15 with [name]: 3 tubullar adenoma's largest 2.3cm.  Plan for repeat Colonoscopy in 2019.    Past Surgical History:  Procedure Laterality Date  . CARDIAC CATHETERIZATION N/A 08/26/2015   Procedure: Left  Heart Cath and Cors/Grafts Angiography;  Surgeon: [name], MD;  Location: [place] CV LAB;  Service: Cardiovascular;  Laterality: N/A;  . CORONARY ARTERY BYPASS GRAFT  2007   "CABG X3", at [facility]   . CORONARY BALLOON ANGIOPLASTY N/A 05/25/2017   Procedure: CORONARY BALLOON ANGIOPLASTY;  Surgeon: [name], MD;  Location: [place] CV LAB;  Service: Cardiovascular;  Laterality: N/A;  . CORONARY BALLOON ANGIOPLASTY N/A 01/25/2018   Procedure: CORONARY BALLOON ANGIOPLASTY;  Surgeon: [name], MD;  Location: [place] CV LAB;  Service: Cardiovascular;  Laterality: N/A;  . CORONARY STENT INTERVENTION N/A 05/25/2017   Procedure: CORONARY STENT INTERVENTION;  Surgeon: [name], MD;  Location: [place] CV LAB;  Service: Cardiovascular;  Laterality: N/A;  . CORONARY STENT INTERVENTION N/A 01/25/2018   Procedure: CORONARY STENT INTERVENTION;  Surgeon: [name], MD;  Location: [place] CV LAB;  Service: Cardiovascular;  Laterality: N/A;  . ESOPHAGOGASTRODUODENOSCOPY N/A 08/12/2017   Procedure: ESOPHAGOGASTRODUODENOSCOPY (EGD);  Surgeon: Jerene Bears, MD;  Location: [facility] END[facility]COPY;  Service: Gastroenterology;  Laterality: N/A;  . LEFT HEART CATH AND CORS/GRAFTS ANGIOGRAPHY N/A 05/23/2017   Procedure: LEFT HEART CATH AND CORS/GRAFTS ANGIOGRAPHY;  Surgeon: [name], MD;  Location: [place] CV LAB;  Service: Cardiovascular;  Laterality: N/A;  . LEFT HEART CATH AND CORS/GRAFTS ANGIOGRAPHY N/A 08/15/2017   Procedure: LEFT HEART CATH AND CORS/GRAFTS ANGIOGRAPHY;  Surgeon: Burnell Blanks, MD;  Location: [place] CV LAB;  Service: Cardiovascular;  Laterality: N/A;  . LEFT HEART CATH AND CORS/GRAFTS ANGIOGRAPHY N/A 01/25/2018   Procedure: LEFT HEART CATH AND CORS/GRAFTS ANGIOGRAPHY;  Surgeon: [name], MD;  Location: [place] CV LAB;  Service: Cardiovascular;  Laterality: N/A;    FAMHx:  Family History  Problem Relation  Age of Onset  . Hypertension Mother   . Diabetes Father   . Crohn's disease Daughter   . Colon cancer Neg Hx   . Rectal cancer Neg Hx     SOCHx:   reports that he quit smoking about 10 years ago. His smoking use included cigarettes. He has a 76.00 pack-year smoking history. He has never used smokeless tobacco. He reports current alcohol use. He reports that he does not use drugs.  ALLERGIES:  No Known Allergies  MEDS:  Current Meds  Medication Sig  . acetaminophen (TYLENOL) 500 MG tablet Take 1,000 mg by mouth every 6 (six) hours as needed for moderate pain or headache.  Marland Kitchen aspirin EC 81 MG tablet Take 81 mg by mouth daily.  Marland Kitchen atorvastatin (LIPITOR) 80 MG tablet Take 1 tablet (80 mg total) by mouth every evening.  . clopidogrel (PLAVIX) 75 MG tablet Take 1 tablet (75 mg total) by mouth daily.  . Empagliflozin-metFORMIN HCl (SYNJARDY) 02-999 MG TABS Take 1 tablet by mouth 2 (two) times daily with a meal.  . glucose blood (ONETOUCH VERIO) test strip Use as instructed  . Hypromellose (ARTIFICIAL TEARS OP) Place 1 drop into both eyes daily as needed (for  dry eyes).   Marland Kitchen icosapent Ethyl (VASCEPA) 1 g capsule Take 2 capsules (2 g total) by mouth 2 (two) times daily.  . isosorbide mononitrate (IMDUR) 120 MG 24 hr tablet TAKE 1/2 (ONE-HALF) TABLET BY MOUTH IN THE MORNING AND 1 TABLET IN THE EVENING  . Lancets (ONETOUCH DELICA PLUS [id number]) MISC 1 Device by Does not apply route in the morning and at bedtime.  Marland Kitchen lisinopril (ZESTRIL) 10 MG tablet TAKE 1 TABLET BY MOUTH DAILY.  . metoprolol (TOPROL-XL) 200 MG 24 hr tablet Take 1 tablet (200 mg total) by mouth daily.  . nitroGLYCERIN (NITROSTAT) 0.4 MG SL tablet Place 1 tablet (0.4 mg total) under the tongue every 5 (five) minutes as needed for chest pain.  . ranolazine (RANEXA) 500 MG 12 hr tablet Take 500 mg by mouth 2 (two) times daily.  . sitaGLIPtin (JANUVIA) 100 MG tablet Take 1 tablet (100 mg total) by mouth daily.  . [DISCONTINUED]  ranolazine (RANEXA) 1000 MG SR tablet TAKE ONE TABLET BY MOUTH TWICE A DAY     ROS: Pertinent items noted in HPI and remainder of comprehensive ROS otherwise negative.  Labs/Other Tests and Data Reviewed:    Recent Labs: 05/07/2019: Hemoglobin 13.1; Platelets 225.0 01/30/2020: ALT 30 02/14/2020: BUN 12; Creatinine, Ser 1.13; Potassium 4.6; Sodium 135   Recent Lipid Panel Lab Results  Component Value Date/Time   CHOL 134 02/14/2020 11:03 AM   TRIG 613 (HH) 02/14/2020 11:03 AM   HDL 18 (L) 02/14/2020 11:03 AM   CHOLHDL 7.4 (H) 02/14/2020 11:03 AM   CHOLHDL 3.6 08/12/2017 03:34 AM   LDLCALC 32 02/14/2020 11:03 AM    Wt Readings from Last 3 Encounters:  04/14/20 210 lb (95.3 kg)  02/14/20 211 lb 6.4 oz (95.9 kg)  01/30/20 211 lb (95.7 kg)     Exam:    Vital Signs:  BP 120/73   Wt 210 lb (95.3 kg)   BMI 30.13 kg/m    Exam not performed due to telephone visit  ASSESSMENT & PLAN:    1. CAD status post CABG x3 2. History of multiple PCI's, last in 2019 3. PAD 4. Type 2 diabetes-A1c 6.6 5. Hypertension 6. Dyslipidemia with high triglycerides and low HDL 7. Stroke  Mr. Repsher has a metabolic dyslipidemia more consistent with diabetes with a very low HDL cholesterol, high triglycerides and elevated LDL cholesterol.  His LDL is at target but his HDL remains low.  Triglycerides were also high for unknown reasons, but I suspect dietary.  This has improved somewhat and he has made some dietary changes.  It does not seem to track with his blood sugars but may have been associated with increases in saturated fats.  He reported a blood sugar spike the other day up to 400 but this was a one-time reading and he says it was associated with eating at [facility].  He may have missed his medication.  With regards to his triglycerides, they are likely improved on Vascepa and he has been on them now for 2 months therefore I recommend repeating his labs.  If his triglycerides remain elevated  would consider adding a fibrate.  He seems to be appropriately treated on a statin.  Unfortunately he has aggressive cardiovascular disease, but also appears to already be on aggressive goal-directed therapies.  Thanks for the kind referral.  COVID-19 Education: The signs and symptoms of COVID-19 were discussed with the patient and how to seek care for testing (follow up with PCP or arrange  E-visit).  The importance of social distancing was discussed today.  Patient Risk:   After full review of this patients clinical status, I feel that they are at least moderate risk at this time.  Time:   Today, I have spent 25 minutes with the patient with telehealth technology discussing dyslipidemia, high triglycerides, coronary artery disease, PAD, stroke, diabetes, dietary approaches to cholesterol management.     Medication Adjustments/Labs and Tests Ordered: Current medicines are reviewed at length with the patient today.  Concerns regarding medicines are outlined above.   Tests Ordered: No orders of the defined types were placed in this encounter.   Medication Changes: No orders of the defined types were placed in this encounter.   Disposition:  in 4 month(s)  Pixie Casino, MD, [facility], [place] Director of the Advanced Lipid Disorders &  Cardiovascular Risk Reduction Clinic Diplomate of the American Board of Clinical Lipidology Attending Cardiologist  Direct Dial: [phone]  Fax: [phone]  Website:  [email or web address]  Pixie Casino, MD  04/14/2020 9:14 AM

## 2020-06-12 NOTE — Progress Notes (Signed)
Cardiology Clinic Note   Patient Name: Jason Velasquez Date of Encounter: 06/13/2020  Primary Care Provider:  [name], DO Primary Cardiologist:  Skeet Latch, MD  Patient Profile    Jason Velasquez 64 year old male presents the clinic today for an evaluation of his chest pain.  Past Medical History    Past Medical History:  Diagnosis Date   Angina, class III ([place]) 08/24/2015   Arthritis    "hands" (01/25/2018)   AVM (arteriovenous malformation) 08/15/2017   CAD (coronary artery disease) of artery bypass graft    CABG- 2007    Carotid stenosis 03/03/2016   11-57% RICA, 2-62% LICA, >03% RECA stenosis Repeat 12/2016.   Claudication [facility])    Controlled type 2 diabetes mellitus without complication ([place]) 02/06/9740   Dx March of 2016. GAD65 negative Anti Islet cell negative     DKA (diabetic ketoacidoses) ([place]) 12/07/2014   Dyspnea    History of blood transfusion ~ 08/2017   "related to LGIB"   History of hiatal hernia    Hx of adenomatous colonic polyps 05/27/2015   Hypercholesterolemia    Hypertension    Hypertensive heart disease 03/03/2016   Iron deficiency anemia due to chronic blood loss suspect from AVMs on Plavix    Left nephrolithiasis 12/07/2014   Migraine    "only in my 89's" (01/25/2018)   Non-ST elevation (NSTEMI) myocardial infarction [facility])    Pneumonia ~ 1990   Stroke ([place]) 07/12/2014   "I didn't realize I'd had one; hospital found it"; denies residual on 01/25/2018   Tubular adenoma of colon 05/31/2015   Colonoscopy 05/20/15 with [name]: 3 tubullar adenoma's largest 2.3cm.  Plan for repeat Colonoscopy in 2019.   Past Surgical History:  Procedure Laterality Date   CARDIAC CATHETERIZATION N/A 08/26/2015   Procedure: Left Heart Cath and Cors/Grafts Angiography;  Surgeon: [name], MD;  Location: [place] [facility];  Service: Cardiovascular;  Laterality: N/A;   CORONARY ARTERY BYPASS GRAFT  2007   "CABG X3", at [place] 05/25/2017   Procedure: [place];  Surgeon: [name], MD;  Location: [place] [facility];  Service: Cardiovascular;  Laterality: N/A;   CORONARY BALLOON ANGIOPLASTY N/A 01/25/2018   Procedure: CORONARY BALLOON ANGIOPLASTY;  Surgeon: [name], MD;  Location: [place] [facility];  Service: Cardiovascular;  Laterality: N/A;   CORONARY STENT INTERVENTION N/A 05/25/2017   Procedure: CORONARY STENT INTERVENTION;  Surgeon: [name], MD;  Location: [place] [facility];  Service: Cardiovascular;  Laterality: N/A;   CORONARY STENT INTERVENTION N/A 01/25/2018   Procedure: CORONARY STENT INTERVENTION;  Surgeon: [name], MD;  Location: [place] [facility];  Service: Cardiovascular;  Laterality: N/A;   ESOPHAGOGASTRODUODENOSCOPY N/A 08/12/2017   Procedure: ESOPHAGOGASTRODUODENOSCOPY (EGD);  Surgeon: Jerene Bears, MD;  Location: [facility];  Service: Gastroenterology;  Laterality: N/A;   LEFT HEART CATH AND CORS/GRAFTS ANGIOGRAPHY N/A 05/23/2017   Procedure: LEFT HEART CATH AND CORS/GRAFTS ANGIOGRAPHY;  Surgeon: [name], MD;  Location: [place] [facility];  Service: Cardiovascular;  Laterality: N/A;   LEFT HEART CATH AND CORS/GRAFTS ANGIOGRAPHY N/A 08/15/2017   Procedure: LEFT HEART CATH AND CORS/GRAFTS ANGIOGRAPHY;  Surgeon: Burnell Blanks, MD;  Location: [place] [facility];  Service: Cardiovascular;  Laterality: N/A;   LEFT HEART CATH AND CORS/GRAFTS ANGIOGRAPHY N/A 01/25/2018   Procedure: LEFT HEART CATH AND CORS/GRAFTS ANGIOGRAPHY;  Surgeon: [name], MD;  Location: [place] [facility];  Service: Cardiovascular;  Laterality: N/A;    Allergies  No Known Allergies  History of Present Illness    [name] has a PMH of essential hypertension, diabetes mellitus type 2, progressive angina, CAD (PCI with DES 05/25/2017), CABG (LIMA-LAD, SVG-PDA, SVG-OM1, carotid stenosis, hepatic steatosis,  dyslipidemia, iron deficiency anemia, PAD, and ED.  He was first evaluated for chest pain 10/16.  He underwent CABG in 2007.  His nuclear stress test 11/16 showed LVEF of 57% with moderate ischemia in the inferior septal, inferior, and anterior locations.  He subsequently underwent left heart catheterization 11/16 which showed severe three-vessel disease including a 90% left main and an occluded RCA and LAD.  His LIMA-LAD was patent but all other vein grafts were occluded.  He had no optimal targets for PCI.  He was started on carvedilol and Ranexa.  He underwent ABI testing 11/16 that revealed diffuse atherosclerosis from the proximal abdominal aorta through the common and external iliac arteries bilaterally.  He was referred to Dr. Gwenlyn Found who advised medical management.  He was also noted to have carotid bruits and underwent carotid ultrasound 3/17, which showed 40-59% stenosis on the right and 1-39% stenosis on the left.  He underwent repeat lower extremity Dopplers and ABIs 6/18 which were unchanged from his prior study.  Medical management was again recommended.  His angina progressed despite optimal medical therapy.  He was again referred for left heart cath 8/18 where he was found to have a progression of his distal left main, LAD, and LCx lesions.  He underwent DES placement in the ostial LCx, mid LCx, and ostial LAD.  His ostial LAD was treated with a cutting balloon reducing the stenosis from 85% to 39%.  He was recommended to continue clopidogrel and aspirin indefinitely.  His echocardiogram 8/18 showed an LVEF of 70%, G1 DD.  On follow-up 10/18 he continued to have angina especially through the night.  There was some concern that his symptoms may be related to GERD.  He was trialed on pantoprazole and Imdur 60 was restarted.  He contacted the cardiology office on 11/7 with progressive symptoms.  He was referred for cardiac catheterization but was found to have anemia with a hemoglobin of 7.7.  He  received a transfusion and underwent upper endoscopy 11/9.  At that time he was found to have a hiatal hernia and 2 AVMs that were treated with PA-C.  He also received an iron infusion.  He then underwent LHC which again showed severe three-vessel disease not amenable to PCI.  He has continued to have refractory angina despite optimal medical therapy.  He again underwent cardiac catheterization 4/19 which showed his ostial LAD was stenosed at 99% and he received DES x1.  He was last seen by Dr. Oval Linsey on 01/30/2020.  During that time he was doing well.  He he mentioned he was having very slight chest discomfort at times.  However he indicated that it was barely noticeable.  His symptoms would happen once every 1 to 2 weeks.  They would last for 10-15 minutes and were not associated with physical activity.  He denied dyspnea on exertion/SOB.  He had plan to obtain a bike and become a member at [employer].  He denied lower extremity edema, orthopnea, and PND.  He indicated that he would notice some pain in his legs with increased physical activity.  He had received both COVID-19 vaccinations.  He presents to the clinic today for follow-up  evaluation and states 2 weeks ago he was bending over to lift his toilet seat when he noticed a dull type chest pain substernally.  He states the pain dissipated with rest.  Since that time he has noticed sharp intermittent chest discomfort that lasts for 1 to 2 seconds.  He notices the pain every 2 to 3 days and sometimes weeks apart.  He has not noticed any pattern related to timing with the chest discomfort.  We discussed that this is most likely related to his chest wall/muscle.  I have instructed him to slowly increase his physical activity and resume his exercise regimen.  He indicated that he had switched from table salt to sea salt.  I educated about salt restriction.  We discussed his medication regimen and will continue everything unchanged at this time.  I will  have him follow-up with Dr. Oval Linsey as scheduled and give him the salty 6 diet sheet.  Today he denies chest pain, shortness of breath, lower extremity edema, fatigue, palpitations, melena, hematuria, hemoptysis, diaphoresis, weakness, presyncope, syncope, orthopnea, and PND.    Home Medications    Prior to Admission medications   Medication Sig Start Date End Date Taking? Authorizing Provider  acetaminophen (TYLENOL) 500 MG tablet Take 1,000 mg by mouth every 6 (six) hours as needed for moderate pain or headache.    [provider]  aspirin EC 81 MG tablet Take 81 mg by mouth daily.    [provider]  atorvastatin (LIPITOR) 80 MG tablet Take 1 tablet (80 mg total) by mouth every evening. 02/14/20 02/08/21  [name], DO  clopidogrel (PLAVIX) 75 MG tablet Take 1 tablet (75 mg total) by mouth daily. 01/10/20   Skeet Latch, MD  Empagliflozin-metFORMIN HCl [facility]) 02-999 MG TABS Take 1 tablet by mouth 2 (two) times daily with a meal. 05/18/19   [name], DO  ferrous sulfate 325 (65 FE) MG tablet Take 1 tablet (325 mg total) by mouth every other day. 09/21/18 07/30/19  [name], DO  glucose blood (ONETOUCH VERIO) test strip Use as instructed 02/14/20   [name], DO  Hypromellose (ARTIFICIAL TEARS OP) Place 1 drop into both eyes daily as needed (for dry eyes).     [provider]  icosapent Ethyl (VASCEPA) 1 g capsule Take 2 capsules (2 g total) by mouth 2 (two) times daily. 02/14/20   Skeet Latch, MD  isosorbide mononitrate (IMDUR) 120 MG 24 hr tablet TAKE 1/2 (ONE-HALF) TABLET BY MOUTH IN THE MORNING AND 1 TABLET IN THE EVENING 01/10/20   Skeet Latch, MD  Lancets [facility] DELICA PLUS [id number]) MISC 1 Device by Does not apply route in the morning and at bedtime. 02/14/20   [name], DO  lisinopril (ZESTRIL) 10 MG tablet TAKE 1 TABLET BY MOUTH DAILY. 11/12/19   Skeet Latch, MD  metoprolol (TOPROL-XL) 200 MG 24 hr tablet  Take 1 tablet (200 mg total) by mouth daily. 01/11/20   [name], DO  nitroGLYCERIN (NITROSTAT) 0.4 MG SL tablet Place 1 tablet (0.4 mg total) under the tongue every 5 (five) minutes as needed for chest pain. 11/27/18   Lendon Colonel, NP  ranolazine (RANEXA) 500 MG 12 hr tablet Take 500 mg by mouth 2 (two) times daily.    [provider]  sitaGLIPtin (JANUVIA) 100 MG tablet Take 1 tablet (100 mg total) by mouth daily. 12/05/19   [name], DO    Family History  Family History  Problem Relation Age of Onset   Hypertension Mother    Diabetes Father    Crohn's disease Daughter    Colon cancer Neg Hx    Rectal cancer Neg Hx    He indicated that his mother is deceased. He indicated that his father is deceased. He indicated that his sister is alive. He indicated that his brother is deceased. He indicated that his maternal grandmother is deceased. He indicated that his maternal grandfather is deceased. He indicated that his paternal grandmother is deceased. He indicated that his paternal grandfather is deceased. He indicated that his daughter is alive. He indicated that his son is alive. He indicated that the status of his neg hx is unknown.  Social History    Social History   Socioeconomic History   Marital status: Divorced    Spouse name: Not on file   Number of children: Not on file   Years of education: 13   Highest education level: Not on file  Occupational History   Occupation: "Retired on disability"  Tobacco Use   Smoking status: Former Smoker    Packs/day: 2.00    Years: 38.00    Pack years: 76.00    Types: Cigarettes    Quit date: 10/04/2009    Years since quitting: 10.6   Smokeless tobacco: Never Used  Vaping Use   Vaping Use: Never used  Substance and Sexual Activity   Alcohol use: Yes    Alcohol/week: 0.0 standard drinks   Drug use: No   Sexual activity: Not Currently  Other Topics Concern   Not on file  Social History  Narrative   Divorced   2 children, 1 [place] 1 [place]   Lives with daughter's mother (friend)   Former smoker rare EtOH no drugs   Epworth Sleepiness Scale = 7 (as of 07/28/2015)      Current Social History 06/18/2019        Patient lives alone in a home which is 1 story. There are 5 steps with handrail up to the entrance the patient uses.       Patient's method of transportation is personal car.      The highest level of education was high school diploma.      The patient currently, "retired on disability"      Identified important Relationships are: States he has people he can count on but did not wish to disclose list.       Pets : 1 indoor/outdoor Futures trader / Fun: "I have lots of interests." (Did not wish to disclose list)       Current Stressors: "None"       Religious / Personal Beliefs: "I believe in God." ([place])       L. [place], RN, BSN       Social Determinants of Health   Financial Resource Strain:    Difficulty of Paying Living Expenses: Not on file  Food Insecurity:    Worried About Charity fundraiser in the Last Year: Not on file   [employer] of Food in the Last Year: Not on file  Transportation Needs:    Lack of Transportation (Medical): Not on file   Lack of Transportation (Non-Medical): Not on file  Physical Activity:    Days of Exercise per Week: Not on file   Minutes of Exercise per Session: Not on file  Stress:    Feeling of Stress : Not on file  Social Connections:    Frequency of Communication with Friends and Family: Not on file   Frequency of Social Gatherings with Friends and Family: Not on file   Attends Religious Services: Not on Electrical engineer or Organizations: Not on file   Attends Archivist Meetings: Not on file   Marital Status: Not on file  Intimate Partner Violence:    Fear of Current or Ex-Partner: Not on file   Emotionally Abused: Not on file   Physically Abused: Not on file     Sexually Abused: Not on file     Review of Systems    General:  No chills, fever, night sweats or weight changes.  Cardiovascular:  No chest pain, dyspnea on exertion, edema, orthopnea, palpitations, paroxysmal nocturnal dyspnea. Dermatological: No rash, lesions/masses Respiratory: No cough, dyspnea Urologic: No hematuria, dysuria Abdominal:   No nausea, vomiting, diarrhea, bright red blood per rectum, melena, or hematemesis Neurologic:  No visual changes, wkns, changes in mental status. All other systems reviewed and are otherwise negative except as noted above.  Physical Exam    VS:  BP 90/60    Pulse 79    Temp 97.6 F (36.4 C)    Ht 5\' 10"  (1.778 m)    Wt 208 lb 9.6 oz (94.6 kg)    SpO2 96%    BMI 29.93 kg/m  , BMI Body mass index is 29.93 kg/m. GEN: Well nourished, well developed, in no acute distress. HEENT: normal. Neck: Supple, no JVD, carotid bruits, or masses. Cardiac: RRR, no murmurs, rubs, or gallops. No clubbing, cyanosis, edema.  Radials/DP/PT 2+ and equal bilaterally.  Respiratory:  Respirations regular and unlabored, clear to auscultation bilaterally. GI: Soft, nontender, nondistended, BS + x 4. MS: no deformity or atrophy. Skin: warm and dry, no rash. Neuro:  Strength and sensation are intact. Psych: Normal affect.  Accessory Clinical Findings    Recent Labs: 01/30/2020: ALT 30 02/14/2020: BUN 12; Creatinine, Ser 1.13; Potassium 4.6; Sodium 135   Recent Lipid Panel    Component Value Date/Time   CHOL 134 02/14/2020 1103   TRIG 613 (HH) 02/14/2020 1103   HDL 18 (L) 02/14/2020 1103   CHOLHDL 7.4 (H) 02/14/2020 1103   CHOLHDL 3.6 08/12/2017 0334   VLDL 31 08/12/2017 0334   LDLCALC 32 02/14/2020 1103    ECG personally reviewed by me today-normal sinus rhythm rightward axis deviation 79 bpm- No acute changes  Echocardiogram 06/01/2017 Study Conclusions   - Procedure narrative: Transthoracic echocardiography. Image  quality was poor. Intravenous  contrast (Definity) was  administered.  - Left ventricle: The cavity size was normal. There was mild focal  basal hypertrophy of the septum. Systolic function was vigorous.  The estimated ejection fraction was in the range of 65% to 70%.  Wall motion was normal; there were no regional wall motion  abnormalities. Doppler parameters are consistent with abnormal  left ventricular relaxation (grade 1 diastolic dysfunction).  - Aortic valve: Trileaflet; mildly thickened, mildly calcified  leaflets.  - Left atrium: The atrium was mildly dilated.  Cardiac catheterization 01/25/2018  CULPRIT: Ost LAD to Prox LAD lesion is 99% stenosed.  A drug-eluting stent was successfully placed using a STENT SIERRA 3.00 X 15 MM. Post-dilated to 3.3 mm  Post intervention, there is a 0% residual stenosis.  Prox LAD to Mid LAD lesion is 100% stenosed after several septal perforators & diagonal branches.  Ost Cx to Prox Cx lesion is 55% stenosed after LAD stent --  PTCA only -- Balloon angioplasty was performed using a BALLOON EMERGE MR PUSH 1.20X12.  Post intervention, there is a 35% residual stenosis.  Non-stenotic Ost LM to Dist LM lesion previously treated with DES - patent.  Ost 1st Mrg lesion is 80% stenosed. - Unable to cross lesion with balloon.  Prox Cx to Mid Cx lesion previously treated with DES - patent after ostial ISR.  Mid RCA lesion is 100% stenosed.  Ost RCA to Prox RCA lesion is 70% stenosed.  Known occluson of SVG-RPDA & SVG-OM  LIMA and is normal in caliber. Prox Graft lesion is 20% stenosed.  The graft exhibits minimal luminal irregularities.  LV end diastolic pressure is moderately elevated.  There is no aortic valve stenosis.  Post intervention, there is a 0% residual stenosis.   Successful DES stent in the proximal and ostial LAD.  Unfortunately the stent did shift into the left main creating a somewhat culotte technique crossing the circumflex.  I was  able to wire the circumflex and balloon the stent struts with a 1.2 mm balloon.  However guide support did not allow larger balloon to cross.  Unable to cross into the OM1 lesion due to tortuosity and multiple stent struts.  Plan: Based on how long the procedure was, with gentle hydration.We will monitor him overnight Zephyr band removal per protocol Continue aggressive risk factor modification and antianginal therapy.  Diagnostic Dominance: Right  Intervention     Assessment & Plan   1.  Coronary artery disease-has noticed intermittent stabbing chest pain x2 weeks.  Underwent CABG x3 in 2007.  Has had several cardiac catheterizations.  Last cardiac catheterization 01/25/2018 he received a DES x1 to his ostial/proximal LAD.  Now on lifelong DAPT.  Pain over the last few weeks appears to be related to musculoskeletal, chest wall type pain. Continue aspirin, Plavix, Vascepa, lisinopril, Imdur, Ranexa, metoprolol Heart healthy low-sodium diet-salty 6 given Increase physical activity as tolerated  Hyperlipidemia-02/14/2020: Cholesterol, Total 134; HDL 18; LDL Chol Calc (NIH) 32; Triglycerides 613 Continue atorvastatin, Vascepa Heart healthy low-sodium diet-salty 6 given Increase physical activity as tolerated Follows with lipid clinic/Dr. Hilty  Essential hypertension-BP today 90/60.  Well-controlled at home Continue lisinopril, metoprolol, Imdur Heart healthy low-sodium diet-salty 6 given Increase physical activity as tolerated  Disposition: Follow-up with Dr. Oval Linsey in 3 months.   Jossie Ng. Kelden Lavallee NP-C    06/13/2020, 9:37 AM [place] [address] Office [phone] Fax [phone]  Notice: This dictation was prepared with Dragon dictation along with smaller phrase technology. Any transcriptional errors that result from this process are unintentional and may not be corrected upon review.

## 2020-06-13 ENCOUNTER — Ambulatory Visit: Payer: PPO | Admitting: General Practice

## 2020-06-13 ENCOUNTER — Other Ambulatory Visit: Payer: Self-pay

## 2020-06-13 ENCOUNTER — Encounter: Payer: Self-pay | Admitting: General Practice

## 2020-06-13 VITALS — BP 90/60 | HR 79 | Temp 97.6°F | Ht 70.0 in | Wt 208.6 lb

## 2020-06-13 DIAGNOSIS — I10 Essential (primary) hypertension: Secondary | ICD-10-CM

## 2020-06-13 DIAGNOSIS — I2510 Atherosclerotic heart disease of native coronary artery without angina pectoris: Secondary | ICD-10-CM | POA: Diagnosis not present

## 2020-06-13 DIAGNOSIS — E7800 Pure hypercholesterolemia, unspecified: Secondary | ICD-10-CM

## 2020-06-13 MED FILL — LISINOPRIL 10 MG TABS: 10 | 90 days supply | Qty: 90 | Fill #2

## 2020-06-13 NOTE — Patient Instructions (Addendum)
Medication Instructions:  Continue current medications  *If you need a refill on your cardiac medications before your next appointment, please call your pharmacy*   Lab Work: None Ordered   Testing/Procedures: None Ordered   Follow-Up: At [facility], you and your health needs are our priority.  As part of our continuing mission to provide you with exceptional heart care, we have created designated Provider Care Teams.  These Care Teams include your primary Cardiologist (physician) and Advanced Practice Providers (APPs -  Physician Assistants and Nurse Practitioners) who all work together to provide you with the care you need, when you need it.  We recommend signing up for the patient portal called "MyChart".  Sign up information is provided on this After Visit Summary.  MyChart is used to connect with patients for Virtual Visits (Telemedicine).  Patients are able to view lab/test results, encounter notes, upcoming appointments, etc.  Non-urgent messages can be sent to your provider as well.   To learn more about what you can do with MyChart, go to [email or web address].    Your next appointment:   3 Months  The format for your next appointment:   In Person  Provider:   Skeet Latch, MD   Other Instructions Increase physical activity

## 2020-06-19 ENCOUNTER — Other Ambulatory Visit: Payer: Self-pay | Admitting: Cardiovascular Disease

## 2020-07-14 ENCOUNTER — Encounter: Payer: Self-pay | Admitting: *Deleted

## 2020-07-14 NOTE — Progress Notes (Unsigned)

## 2020-07-15 NOTE — Progress Notes (Unsigned)
Things That May Be Affecting Your Health:  Alcohol  Hearing loss  Pain   X Depression  Home Safety  Sexual Health  X Diabetes X Lack of physical activity  Stress   Difficulty with daily activities  Loneliness  Tiredness   Drug use  Medicines  Tobacco use   Falls  Motor Vehicle Safety  Weight   Food choices  Oral Health  Other    YOUR PERSONALIZED HEALTH PLAN : 1. Schedule your next subsequent Medicare Wellness visit in one year 2. Attend all of your regular appointments to address your medical issues 3. Complete the preventative screenings and services   Annual Wellness Visit   Medicare Covered Preventative Screenings and [place] Men and Women Who How Often Need? Date of Last Service Action  Abdominal Aortic Aneurysm Adults with AAA risk factors Once y  Good candidate for screening  Alcohol Misuse and Counseling All Adults Screening once a year if no alcohol misuse. Counseling up to 4 face to face sessions. y    Bone Density Measurement  Adults at risk for osteoporosis Once every 2 yrs n    Lipid Panel Z13.6 All adults without CV disease Once every 5 yrs n    Colorectal Cancer   Stool sample or  Colonoscopy All adults 19 and older   Once every year  Every 10 years  2020 3 year follow up  Depression All Adults Once a year  Today   Diabetes Screening Blood glucose, post glucose load, or GTT Z13.1  All adults at risk  Pre-diabetics  Once per year  Twice per year n    Diabetes  Self-Management Training All adults Diabetics 10 hrs first year; 2 hours subsequent years. Requires Copay y    Glaucoma  Diabetics  Family history of glaucoma  African Americans 2 yrs +  Hispanic Americans 30 yrs + Annually - requires coppay y    Hepatitis C Z72.89 or F19.20  High Risk for HCV  Born between 1945 and 1965  Annually  Once  2016   HIV Z11.4 All adults based on risk  Annually btw ages 25 & 53 regardless of risk  Annually > 65 yrs if at  increased risk  2018   Lung Cancer Screening Asymptomatic adults aged 25-77 with 30 pack yr history and current smoker OR quit within the last 15 yrs Annually Must have counseling and shared decision making documentation before first screen y  If interested  Medical Nutrition Therapy Adults with   Diabetes  Renal disease  Kidney transplant within past 3 yrs 3 hours first year; 2 hours subsequent years y    Obesity and Counseling All adults Screening once a year Counseling if BMI 30 or higher y Today   Tobacco Use Counseling Adults who use tobacco  Up to 8 visits in one year     Vaccines Z23  Hepatitis B  Influenza   Pneumonia  Adults   Once  Once every flu season  Two different vaccines separated by one year y    Next Annual Wellness Visit People with Medicare Every year  Today     Services & Screenings Women Who How Often Need  Date of Last Service Action  Mammogram  Z12.31 Women over 29 One baseline ages 23-39. Annually ager 40 yrs+     Pap tests All women Annually if high risk. Every 2 yrs for normal risk women     Screening for cervical cancer with  Pap (Z01.419 nl or Z01.411abnl) &  HPV Z11.51 Women aged 87 to 4 Once every 5 yrs     Screening pelvic and breast exams All women Annually if high risk. Every 2 yrs for normal risk women     Sexually Transmitted Diseases  Chlamydia  Gonorrhea  Syphilis All at risk adults Annually for non pregnant females at increased risk         [place] Men Who How Ofter Need  Date of Last Service Action  Prostate Cancer - DRE & PSA Men over 50 Annually.  DRE might require a copay.   Yes if interested  Sexually Transmitted Diseases  Syphilis All at risk adults Annually for men at increased risk

## 2020-07-16 MED FILL — JANUVIA 100 MG TABLET: 100 | 90 days supply | Qty: 90 | Fill #2

## 2020-07-30 ENCOUNTER — Telehealth: Payer: Self-pay | Admitting: *Deleted

## 2020-07-30 DIAGNOSIS — E1151 Type 2 diabetes mellitus with diabetic peripheral angiopathy without gangrene: Secondary | ICD-10-CM

## 2020-07-30 NOTE — Telephone Encounter (Signed)
Received fax from [facility] requesting refill on Synjardy. Unable to reorder in Epic without alternate Dx Code.   Last OV 07/12/2019 with Resident  Will route to [employer] to assist patient in scheduling first available appt with PCP. Hubbard Hartshorn, BSN, RN-BC

## 2020-08-01 MED ORDER — SYNJARDY 5-1000 MG PO TABS: 1.0000 | ORAL_TABLET | Freq: Two times a day (BID) | ORAL | 3 refills | Status: DC

## 2020-08-07 ENCOUNTER — Encounter: Payer: Self-pay | Admitting: Internal Medicine

## 2020-08-07 ENCOUNTER — Ambulatory Visit (INDEPENDENT_AMBULATORY_CARE_PROVIDER_SITE_OTHER): Payer: PPO | Admitting: Internal Medicine

## 2020-08-07 ENCOUNTER — Other Ambulatory Visit: Payer: Self-pay

## 2020-08-07 DIAGNOSIS — Z0000 Encounter for general adult medical examination without abnormal findings: Secondary | ICD-10-CM

## 2020-08-07 DIAGNOSIS — E1151 Type 2 diabetes mellitus with diabetic peripheral angiopathy without gangrene: Secondary | ICD-10-CM

## 2020-08-07 NOTE — Progress Notes (Signed)
This AWV is being conducted by Tibbie only. The patient was located at "in [facility]" and I was located in [facility]. The patient's identity was confirmed using their DOB and current address. The patient or his/her legal guardian has consented to being evaluated through a telephone encounter and understands the associated risks (an examination cannot be done and the patient may need to come in for an appointment) / benefits (allows the patient to remain at home, decreasing exposure to coronavirus). I personally spent 28 minutes conducting the AWV.  Subjective:   Jason Velasquez is a 64 y.o. male who presents for a Medicare Annual Wellness Visit.  The following items have been reviewed and updated today in the appropriate area in the EMR.   Health Risk Assessment  Height, weight, BMI, and BP Visual acuity if needed Depression screen Fall risk / safety level Advance directive discussion Medical and family history were reviewed and updated Updating list of other providers & suppliers Medication reconciliation, including over the counter medicines Cognitive screen Written screening schedule Risk Factor list Personalized health advice, risky behaviors, and treatment advice  Social History   Social History Narrative   Divorced   2 children, 1 [place] 1 [place]   Former smoker rare EtOH no drugs   Epworth Sleepiness Scale = 7 (as of 07/28/2015)      Current Social History 08/07/2020       Patient lives alone in a home which is 1 story. There are 5 steps with handrail up to the entrance the patient uses.       Patient's method of transportation is personal car.      The highest level of education was some college      The patient currently, "retired on disability"      Identified important Relationships are: States he has people he can count on but did not wish to disclose list.       Pets : 1 indoor/outdoor Futures trader / Fun: "Being on the computer, watching TV, keeping the  lawn, shrubberies looking nice."      Current Stressors: "None, learning to control any stress whatsoever."      Religious / Personal Beliefs: "I believe in God." ([place])       L. Brielynn Sekula, BSN, RN-BC               Objective:    Vitals: There were no vitals taken for this visit. Vitals are unable to obtained due to [id number]-[id number] public health emergency  Activities of Daily Living In your present state of health, do you have any difficulty performing the following activities: 08/07/2020 02/14/2020  Hearing? N N  Vision? Y N  Comment Eye appt tomorrow -  Difficulty concentrating or making decisions? N N  Walking or climbing stairs? N N  Dressing or bathing? N N  Doing errands, shopping? N N  Some recent data might be hidden    Goals Goals      "Just to stay alive" (pt-stated)      Bicycle riding (pt-stated)      Patient is considering purchasing a bicycle for outdoor riding.      Blood Pressure < 130/80      HEMOGLOBIN A1C < 7.0       Fall Risk Fall Risk  08/07/2020 02/14/2020 11/08/2019 07/12/2019 06/18/2019  Falls in the past year? 0 0 0 0 0  Number falls in past yr: - - - - -  Comment - - - - -  Injury with Fall? - - - - -  Comment - - - - -  Risk Factor Category  - - - - -  Comment - - - - -  Risk for fall due to : No Fall Risks No Fall Risks - - -  Risk for fall due to: Comment - - - - -  Follow up Education provided;Falls prevention discussed Falls prevention discussed Falls prevention discussed Falls evaluation completed Education provided;Falls prevention discussed   CDC Handout on Fall Prevention mailed to patient   Depression Screen PHQ 2/9 Scores 08/07/2020 02/14/2020 11/08/2019 06/18/2019  PHQ - 2 Score 1 0 0 0  PHQ- 9 Score 5 - - 1     Cognitive Testing Six-Item Cognitive Screener   "I would like to ask you some questions that ask you to use your memory. I am going to name three objects. Please wait until I say all three words, then repeat them.  Remember what they are  because I am going to ask you to name them again in a few minutes. Please repeat these words for me: APPLE--TABLE--PENNY. (Interviewer may repeat names 3 times if necessary but repetition not scored.)  Did patient correctly repeat all three words? Yes - may proceed with screen  What year is this? Correct What month is this? Correct What day of the week is this? Correct  What were the three objects I asked you to remember?  Apple Correct  Table Correct  Penny Correct  Score one point for each incorrect answer.  A score of 2 or more points warrants additional investigation.  Patient's score 0   Assessment and Plan:     Patient has appt with Dr. Heber Garner on 09/18/2020 at 8:15 AM  Patient has appt with Dr. Dorcas Carrow for eye exam on 08/08/2020. Requested copy of exam be faxed to [facility]  Referral has been placed to [facility] for continued work with diet and weight loss  Patient is considering purchasing a bicycle for outdoor riding   During the course of the visit the patient was educated and counseled about appropriate screening and preventive services as documented in the assessment and plan.  The printed AVS was given to the patient and included an updated screening schedule, a list of risk factors, and personalized health advice.        Velora Heckler, RN  08/07/2020

## 2020-08-07 NOTE — Patient Instructions (Addendum)
Things That May Be Affecting Your Health:  Alcohol  Hearing loss  Pain    Depression  Home Safety  Sexual Health  X Diabetes X Lack of physical activity  Stress   Difficulty with daily activities  Loneliness  Tiredness   Drug use  Medicines  Tobacco use   Falls  Motor Vehicle Safety  Weight   Food choices  Oral Health  Other    YOUR PERSONALIZED HEALTH PLAN : 1. Schedule your next subsequent Medicare Wellness visit in one year 2. Attend all of your regular appointments to address your medical issues 3. Complete the preventative screenings and services 4. Keep appt with Dr. Heber Navesink on 09/18/2020 at 8:15 AM 5. Referral has been placed to [facility] for continued work with diet and weight loss 6. Please ask Dr. Dorcas Carrow to fax a copy of your eye exam to [place] [phone] 7. Best wishes for finding the right bicycle for you!  Annual Wellness Visit                       Medicare Covered Preventative Screenings and Services  Services & Screenings Men and Women Who How Often Need? Date of Last Service Action  Abdominal Aortic Aneurysm Adults with AAA risk factors Once y  Good candidate for screening  Alcohol Misuse and Counseling All Adults Screening once a year if no alcohol misuse. Counseling up to 4 face to face sessions.     Bone Density Measurement  Adults at risk for osteoporosis Once every 2 yrs n    Lipid Panel Z13.6 All adults without CV disease Once every 5 yrs n    Colorectal Cancer   Stool sample or  Colonoscopy All adults 52 and older   Once every year  Every 10 years  2020 3 year follow up  Depression All Adults Once a year  Today PHQ-9 = 5  Diabetes Screening Blood glucose, post glucose load, or GTT Z13.1  All adults at risk  Pre-diabetics  Once per year  Twice per year n    Diabetes  Self-Management Training All adults Diabetics 10 hrs first year; 2 hours subsequent years. Requires Copay y    Glaucoma   Diabetics  Family history of glaucoma  African Americans 28 yrs +  Hispanic Americans 29 yrs + Annually - requires coppay y  Appt with Dr. Dorcas Carrow on 08/08/2020  Hepatitis C Z72.89 or F19.20  High Risk for HCV  Born between 1945 and 1965  Annually  Once  2016   HIV Z11.4 All adults based on risk  Annually btw ages 93 & 34 regardless of risk  Annually > 65 yrs if at increased risk  2018   Lung Cancer Screening Asymptomatic adults aged 41-77 with 30 pack yr history and current smoker OR quit within the last 15 yrs Annually Must have counseling and shared decision making documentation before first screen y  If interested  [place] with   Diabetes  Renal disease  Kidney transplant within past 3 yrs 3 hours first year; 2 hours subsequent years y  Referral to [employer]   Obesity and Counseling All adults Screening once a year Counseling if BMI 30 or higher y Today   Tobacco Use Counseling Adults who use tobacco  Up to 8 visits in one year     Vaccines Z23  Hepatitis B  Influenza   Pneumonia  Adults   Once  Once every flu season  Two different  vaccines separated by one year y  Thank you for getting your Covid vaccinations!  Flu vaccine and Prevnar 13 at [facility] with Dr. Heber Duncan on 09/18/2020  Next Annual Wellness Visit People with Medicare Every year  Today     [place] Women Who How Often Need  Date of Last Service Action  Mammogram  Z12.31 Women over 55 One baseline ages 71-39. Annually ager 40 yrs+     Pap tests All women Annually if high risk. Every 2 yrs for normal risk women     Screening for cervical cancer with   Pap (Z01.419 nl or Z01.411abnl) &  HPV Z11.51 Women aged 54 to 59 Once every 5 yrs     Screening pelvic and breast exams All women Annually if high risk. Every 2 yrs for normal risk women     Sexually Transmitted Diseases  Chlamydia  Gonorrhea  Syphilis All at risk adults  Annually for non pregnant females at increased risk         [place] Men Who How Ofter Need  Date of Last Service Action  Prostate Cancer - DRE & PSA Men over 50 Annually.  DRE might require a copay.   Yes if interested  Sexually Transmitted Diseases  Syphilis All at risk adults Annually for men at increased risk            Diabetes Mellitus and [place] care is an important part of your health, especially when you have diabetes. Diabetes may cause you to have problems because of poor blood flow (circulation) to your feet and legs, which can cause your skin to:  Become thinner and drier.  Break more easily.  Heal more slowly.  Peel and crack. You may also have nerve damage (neuropathy) in your legs and feet, causing decreased feeling in them. This means that you may not notice minor injuries to your feet that could lead to more serious problems. Noticing and addressing any potential problems early is the best way to prevent future foot problems. How to care for your feet Foot hygiene  Wash your feet daily with warm water and mild soap. Do not use hot water. Then, pat your feet and the areas between your toes until they are completely dry. Do not soak your feet as this can dry your skin.  Trim your toenails straight across. Do not dig under them or around the cuticle. File the edges of your nails with an emery board or nail file.  Apply a moisturizing lotion or petroleum jelly to the skin on your feet and to dry, brittle toenails. Use lotion that does not contain alcohol and is unscented. Do not apply lotion between your toes. Shoes and socks  Wear clean socks or stockings every day. Make sure they are not too tight. Do not wear knee-high stockings since they may decrease blood flow to your legs.  Wear shoes that fit properly and have enough cushioning. Always look in your shoes before you put them on to be sure there are no objects inside.  To  break in new shoes, wear them for just a few hours a day. This prevents injuries on your feet. Wounds, scrapes, corns, and calluses  Check your feet daily for blisters, cuts, bruises, sores, and redness. If you cannot see the bottom of your feet, use a mirror or ask someone for help.  Do not cut corns or calluses or try to remove them with medicine.  If you find a  minor scrape, cut, or break in the skin on your feet, keep it and the skin around it clean and dry. You may clean these areas with mild soap and water. Do not clean the area with peroxide, alcohol, or iodine.  If you have a wound, scrape, corn, or callus on your foot, look at it several times a day to make sure it is healing and not infected. Check for: ? Redness, swelling, or pain. ? Fluid or blood. ? Warmth. ? Pus or a bad smell. General instructions  Do not cross your legs. This may decrease blood flow to your feet.  Do not use heating pads or hot water bottles on your feet. They may burn your skin. If you have lost feeling in your feet or legs, you may not know this is happening until it is too late.  Protect your feet from hot and cold by wearing shoes, such as at the beach or on hot pavement.  Schedule a complete foot exam at least once a year (annually) or more often if you have foot problems. If you have foot problems, report any cuts, sores, or bruises to your health care provider immediately. Contact a health care provider if:  You have a medical condition that increases your risk of infection and you have any cuts, sores, or bruises on your feet.  You have an injury that is not healing.  You have redness on your legs or feet.  You feel burning or tingling in your legs or feet.  You have pain or cramps in your legs and feet.  Your legs or feet are numb.  Your feet always feel cold.  You have pain around a toenail. Get help right away if:  You have a wound, scrape, corn, or callus on your foot and: ? You  have pain, swelling, or redness that gets worse. ? You have fluid or blood coming from the wound, scrape, corn, or callus. ? Your wound, scrape, corn, or callus feels warm to the touch. ? You have pus or a bad smell coming from the wound, scrape, corn, or callus. ? You have a fever. ? You have a red line going up your leg. Summary  Check your feet every day for cuts, sores, red spots, swelling, and blisters.  Moisturize feet and legs daily.  Wear shoes that fit properly and have enough cushioning.  If you have foot problems, report any cuts, sores, or bruises to your health care provider immediately.  Schedule a complete foot exam at least once a year (annually) or more often if you have foot problems. This information is not intended to replace advice given to you by your health care provider. Make sure you discuss any questions you have with your health care provider. Document Revised: 06/13/2019 Document Reviewed: 10/22/2016 Elsevier Patient Education  [place] Prevention in the Home, Adult Falls can cause injuries. They can happen to people of all ages. There are many things you can do to make your home safe and to help prevent falls. Ask for help when making these changes, if needed. What actions can I take to prevent falls? General Instructions  Use good lighting in all rooms. Replace any light bulbs that burn out.  Turn on the lights when you go into a dark area. Use night-lights.  Keep items that you use often in easy-to-reach places. Lower the shelves around your home if necessary.  Set up your furniture so you have a clear  path. Avoid moving your furniture around.  Do not have throw rugs and other things on the floor that can make you trip.  Avoid walking on wet floors.  If any of your floors are uneven, fix them.  Add color or contrast paint or tape to clearly mark and help you see: ? Any grab bars or handrails. ? First and last steps of  stairways. ? Where the edge of each step is.  If you use a stepladder: ? Make sure that it is fully opened. Do not climb a closed stepladder. ? Make sure that both sides of the stepladder are locked into place. ? Ask someone to hold the stepladder for you while you use it.  If there are any pets around you, be aware of where they are. What can I do in the bathroom?      Keep the floor dry. Clean up any water that spills onto the floor as soon as it happens.  Remove soap buildup in the tub or shower regularly.  Use non-skid mats or decals on the floor of the tub or shower.  Attach bath mats securely with double-sided, non-slip rug tape.  If you need to sit down in the shower, use a plastic, non-slip stool.  Install grab bars by the toilet and in the tub and shower. Do not use towel bars as grab bars. What can I do in the bedroom?  Make sure that you have a light by your bed that is easy to reach.  Do not use any sheets or blankets that are too big for your bed. They should not hang down onto the floor.  Have a firm chair that has side arms. You can use this for support while you get dressed. What can I do in the kitchen?  Clean up any spills right away.  If you need to reach something above you, use a strong step stool that has a grab bar.  Keep electrical cords out of the way.  Do not use floor polish or wax that makes floors slippery. If you must use wax, use non-skid floor wax. What can I do with my stairs?  Do not leave any items on the stairs.  Make sure that you have a light switch at the top of the stairs and the bottom of the stairs. If you do not have them, ask someone to add them for you.  Make sure that there are handrails on both sides of the stairs, and use them. Fix handrails that are broken or loose. Make sure that handrails are as long as the stairways.  Install non-slip stair treads on all stairs in your home.  Avoid having throw rugs at the top or  bottom of the stairs. If you do have throw rugs, attach them to the floor with carpet tape.  Choose a carpet that does not hide the edge of the steps on the stairway.  Check any carpeting to make sure that it is firmly attached to the stairs. Fix any carpet that is loose or worn. What can I do on the outside of my home?  Use bright outdoor lighting.  Regularly fix the edges of walkways and driveways and fix any cracks.  Remove anything that might make you trip as you walk through a door, such as a raised step or threshold.  Trim any bushes or trees on the path to your home.  Regularly check to see if handrails are loose or broken. Make sure that both  sides of any steps have handrails.  Install guardrails along the edges of any raised decks and porches.  Clear walking paths of anything that might make someone trip, such as tools or rocks.  Have any leaves, snow, or ice cleared regularly.  Use sand or salt on walking paths during winter.  Clean up any spills in your garage right away. This includes grease or oil spills. What other actions can I take?  Wear shoes that: ? Have a low heel. Do not wear high heels. ? Have rubber bottoms. ? Are comfortable and fit you well. ? Are closed at the toe. Do not wear open-toe sandals.  Use tools that help you move around (mobility aids) if they are needed. These include: ? Canes. ? Walkers. ? Scooters. ? Crutches.  Review your medicines with your doctor. Some medicines can make you feel dizzy. This can increase your chance of falling. Ask your doctor what other things you can do to help prevent falls. Where to find more information  Centers for Disease Control and Prevention, STEADI: https://garcia.biz/  [employer] on Aging: BrainJudge.co.uk Contact a doctor if:  You are afraid of falling at home.  You feel weak, drowsy, or dizzy at home.  You fall at home. Summary  There are many simple things that you can do  to make your home safe and to help prevent falls.  Ways to make your home safe include removing tripping hazards and installing grab bars in the bathroom.  Ask for help when making these changes in your home. This information is not intended to replace advice given to you by your health care provider. Make sure you discuss any questions you have with your health care provider. Document Revised: 01/11/2019 Document Reviewed: 05/05/2017 Elsevier Patient Education  2020 [place] Maintenance, Male Adopting a healthy lifestyle and getting preventive care are important in promoting health and wellness. Ask your health care provider about:  The right schedule for you to have regular tests and exams.  Things you can do on your own to prevent diseases and keep yourself healthy. What should I know about diet, weight, and exercise? Eat a healthy diet   Eat a diet that includes plenty of vegetables, fruits, low-fat dairy products, and lean protein.  Do not eat a lot of foods that are high in solid fats, added sugars, or sodium. Maintain a healthy weight Body mass index (BMI) is a measurement that can be used to identify possible weight problems. It estimates body fat based on height and weight. Your health care provider can help determine your BMI and help you achieve or maintain a healthy weight. Get regular exercise Get regular exercise. This is one of the most important things you can do for your health. Most adults should:  Exercise for at least 150 minutes each week. The exercise should increase your heart rate and make you sweat (moderate-intensity exercise).  Do strengthening exercises at least twice a week. This is in addition to the moderate-intensity exercise.  Spend less time sitting. Even light physical activity can be beneficial. Watch cholesterol and blood lipids Have your blood tested for lipids and cholesterol at 65 years of age, then have this test every 5  years. You may need to have your cholesterol levels checked more often if:  Your lipid or cholesterol levels are high.  You are older than 65 years of age.  You are at high risk for heart disease. What should I know about cancer  screening? Many types of cancers can be detected early and may often be prevented. Depending on your health history and family history, you may need to have cancer screening at various ages. This may include screening for:  Colorectal cancer.  Prostate cancer.  Skin cancer.  Lung cancer. What should I know about heart disease, diabetes, and high blood pressure? Blood pressure and heart disease  High blood pressure causes heart disease and increases the risk of stroke. This is more likely to develop in people who have high blood pressure readings, are of African descent, or are overweight.  Talk with your health care provider about your target blood pressure readings.  Have your blood pressure checked: ? Every 3-5 years if you are 54-68 years of age. ? Every year if you are 11 years old or older.  If you are between the ages of 35 and 35 and are a current or former smoker, ask your health care provider if you should have a one-time screening for abdominal aortic aneurysm (AAA). Diabetes Have regular diabetes screenings. This checks your fasting blood sugar level. Have the screening done:  Once every three years after age 11 if you are at a normal weight and have a low risk for diabetes.  More often and at a younger age if you are overweight or have a high risk for diabetes. What should I know about preventing infection? Hepatitis B If you have a higher risk for hepatitis B, you should be screened for this virus. Talk with your health care provider to find out if you are at risk for hepatitis B infection. Hepatitis C Blood testing is recommended for:  Everyone born from [birth year] through 1966.  Anyone with known risk factors for hepatitis C. Sexually  transmitted infections (STIs)  You should be screened each year for STIs, including gonorrhea and chlamydia, if: ? You are sexually active and are younger than 65 years of age. ? You are older than 65 years of age and your health care provider tells you that you are at risk for this type of infection. ? Your sexual activity has changed since you were last screened, and you are at increased risk for chlamydia or gonorrhea. Ask your health care provider if you are at risk.  Ask your health care provider about whether you are at high risk for HIV. Your health care provider may recommend a prescription medicine to help prevent HIV infection. If you choose to take medicine to prevent HIV, you should first get tested for HIV. You should then be tested every 3 months for as long as you are taking the medicine. Follow these instructions at home: Lifestyle  Do not use any products that contain nicotine or tobacco, such as cigarettes, e-cigarettes, and chewing tobacco. If you need help quitting, ask your health care provider.  Do not use street drugs.  Do not share needles.  Ask your health care provider for help if you need support or information about quitting drugs. Alcohol use  Do not drink alcohol if your health care provider tells you not to drink.  If you drink alcohol: ? Limit how much you have to 0-2 drinks a day. ? Be aware of how much alcohol is in your drink. In the [place] one drink equals one 12 oz bottle of beer (355 mL), one 5 oz glass of wine (148 mL), or one 1 oz glass of hard liquor (44 mL). General instructions  Schedule regular health, dental, and eye exams.  Stay  current with your vaccines.  Tell your health care provider if: ? You often feel depressed. ? You have ever been abused or do not feel safe at home. Summary  Adopting a healthy lifestyle and getting preventive care are important in promoting health and wellness.  Follow your health care provider's  instructions about healthy diet, exercising, and getting tested or screened for diseases.  Follow your health care provider's instructions on monitoring your cholesterol and blood pressure. This information is not intended to replace advice given to you by your health care provider. Make sure you discuss any questions you have with your health care provider. Document Revised: 09/13/2018 Document Reviewed: 09/13/2018 Elsevier Patient Education  2020 [employer].

## 2020-08-14 ENCOUNTER — Other Ambulatory Visit: Payer: Self-pay | Admitting: Cardiovascular Disease

## 2020-08-14 NOTE — Progress Notes (Signed)
I discussed the AWV findings with the RN who conducted the visit. I was present in the office suite and immediately available to provide assistance and direction throughout the time the service was provided.  Dr. Jose Persia Internal Medicine PGY-2  08/14/2020, 1:22 PM

## 2020-08-14 NOTE — Progress Notes (Signed)
Internal Medicine Clinic Attending  Case discussed with Dr.  Charleen Kirks  at the time of the visit.  We reviewed the AWV findings.  I agree with the assessment, diagnosis, and plan of care documented in the AWV note.

## 2020-09-18 ENCOUNTER — Ambulatory Visit (INDEPENDENT_AMBULATORY_CARE_PROVIDER_SITE_OTHER): Payer: PPO | Admitting: Dietician

## 2020-09-18 ENCOUNTER — Encounter: Payer: Self-pay | Admitting: Internal Medicine

## 2020-09-18 ENCOUNTER — Other Ambulatory Visit: Payer: Self-pay

## 2020-09-18 ENCOUNTER — Ambulatory Visit (INDEPENDENT_AMBULATORY_CARE_PROVIDER_SITE_OTHER): Payer: PPO | Admitting: Internal Medicine

## 2020-09-18 ENCOUNTER — Encounter: Payer: Self-pay | Admitting: Dietician

## 2020-09-18 VITALS — BP 122/72 | HR 77 | Temp 98.1°F | Ht 71.0 in | Wt 209.5 lb

## 2020-09-18 DIAGNOSIS — Z23 Encounter for immunization: Secondary | ICD-10-CM

## 2020-09-18 DIAGNOSIS — E1151 Type 2 diabetes mellitus with diabetic peripheral angiopathy without gangrene: Secondary | ICD-10-CM

## 2020-09-18 DIAGNOSIS — Z6829 Body mass index (BMI) 29.0-29.9, adult: Secondary | ICD-10-CM | POA: Diagnosis not present

## 2020-09-18 DIAGNOSIS — Z713 Dietary counseling and surveillance: Secondary | ICD-10-CM | POA: Diagnosis not present

## 2020-09-18 DIAGNOSIS — E785 Hyperlipidemia, unspecified: Secondary | ICD-10-CM

## 2020-09-18 LAB — POCT GLYCOSYLATED HEMOGLOBIN (HGB A1C): Hemoglobin A1C: 7.3 % — AB (ref 4.0–5.6)

## 2020-09-18 LAB — GLUCOSE, CAPILLARY: Glucose-Capillary: 147 mg/dL — ABNORMAL HIGH (ref 70–99)

## 2020-09-18 MED ORDER — TRULICITY 1.5 MG/0.5ML ~~LOC~~ SOAJ
1.5000 mg | SUBCUTANEOUS | 5 refills | Status: DC
Start: 2020-10-02 — End: 2021-03-30

## 2020-09-18 MED ORDER — TRULICITY 0.75 MG/0.5ML ~~LOC~~ SOPN
0.7500 mg | PEN_INJECTOR | SUBCUTANEOUS | 0 refills | Status: AC
Start: 2020-09-18 — End: 2020-10-02

## 2020-09-18 NOTE — Patient Instructions (Signed)
Healthier fats to try to work in are:  [employer] with seasonings (salt/pepper, onion and garlic powder)  Flaxseed- add teaspoon to tablespoon to to oatmeal or smoothies  avocado with a dash of salt and lemon juice Can mash avocado with tuna to make tuna salad Cucumber and onion with vinegar and olive oil Peanut butter sandwich for lunch is heart healthy [place] instead bologna at lunch Fish is excellent- can do tuna salad   Consider whole grain english muffin for biscuits or make your own biscuits with olive oil   Veggies with olive oil are good snacks.  Call me if and when you think you want more information about a continuous glucose monitor  Butch Penny [phone]

## 2020-09-18 NOTE — Progress Notes (Signed)
  Subjective:  HPI: Jason Velasquez is a 64 y.o. male who presents for f/u DM and HTN   Please see Assessment and Plan below for the status of his chronic medical problems.  Objective:  Physical Exam: Vitals:   09/18/20 0846  BP: 122/72  Pulse: 77  Temp: 98.1 F (36.7 C)  TempSrc: Oral  SpO2: 100%  Weight: 209 lb 8 oz (95 kg)  Height: 5\' 11"  (1.803 m)   Body mass index is 29.22 kg/m. Physical Exam Nursing note reviewed.  Constitutional:      Appearance: Normal appearance.  Cardiovascular:     Rate and Rhythm: Normal rate and regular rhythm.  Pulmonary:     Effort: Pulmonary effort is normal.  Abdominal:     General: Abdomen is flat.     Palpations: Abdomen is soft.  Musculoskeletal:     Right lower leg: No edema.     Left lower leg: No edema.  Neurological:     Mental Status: He is alert.    Assessment & Plan:  See Encounters Tab for problem based charting.  Medications Ordered Meds ordered this encounter  Medications  . Dulaglutide (TRULICITY) 5.18 ZF/5.8IP SOPN    Sig: Inject 0.75 mg into the skin once a week for 14 days.    Dispense:  1 mL    Refill:  0  . Dulaglutide (TRULICITY) 1.5 PG/9.8MK SOPN    Sig: Inject 1.5 mg into the skin once a week.    Dispense:  2 mL    Refill:  5    Place on file to fill in 2 weeks (provided sample of 0.75 dose in office to start) Discontinue Januvia.   Other Orders Orders Placed This Encounter  Procedures  . Flu Vaccine QUAD 36+ mos IM  . Lipid Profile  . Glucose, capillary  . POC Hbg A1C   Follow Up: Return in about 3 months (around 12/17/2020).

## 2020-09-18 NOTE — Patient Instructions (Signed)
I am starting you on a new diabetes medication called Trulicity, you will inject it once a week.  This medication replaces Januvia, you can stop taking Januvia.

## 2020-09-18 NOTE — Progress Notes (Signed)
Medical Nutrition Therapy:  Appt start time: 1000 end time:  1100. Visit # 2  Assessment:  Primary concerns today: glycemic and lipid control and weight loss.  Jason Velasquez presents today for assistance with lowering his blood sugar and decreasing his weight. He has trouble chewing because he cannot use his new dentures to eat. Marland KitchenHe uses  a nutrabullet for fruit and veggie smoothies. He does his own shopping and cooking and stats he eats at home more often that he used to. He feels he gets plenty of fruits, nuts and vegetables in each day.  He eats out fairly often and gets fried foods and often meals heavy in fat and starch and snacks on processed foods that are high in fat and sugar. His weight and his blood sugar are elevated and stable. He is interested in a trial of continuous glucose monitoring at his next visit. He states his fasting glucose is usually the highest fo the day and wonders if he has the dawn phenomena.   ANTHROPOMETRICS: Estimated body mass index is 29.22 kg/m as calculated from the following:   Height as of an earlier encounter on 09/18/20: 5\' 11"  (1.803 m).   Weight as of an earlier encounter on 09/18/20: 209 lb 8 oz (95 kg).  WEIGHT HISTORY: Wt Readings from Last 10 Encounters:  09/18/20 209 lb 8 oz (95 kg)  06/13/20 208 lb 9.6 oz (94.6 kg)  04/14/20 210 lb (95.3 kg)  02/14/20 211 lb 6.4 oz (95.9 kg)  01/30/20 211 lb (95.7 kg)  11/08/19 213 lb 14.4 oz (97 kg)  07/30/19 210 lb 12.8 oz (95.6 kg)  07/12/19 211 lb 9.6 oz (96 kg)  06/18/19 210 lb (95.3 kg)  03/19/19 210 lb (95.3 kg)   Usual physical activity: watches TV often during day and night time, works sometimes doing odd jobs SLEEP:he feels his sleep is adequate DIABETES MEDICATIONS: Januvia and synjardy BLOOD SUGAR: 170 today- Lab Results  Component Value Date   HGBA1C 7.3 (A) 09/18/2020   HGBA1C 6.6 (A) 02/14/2020   HGBA1C 7.0 (A) 11/08/2019   HGBA1C 7.0 (A) 07/12/2019   HGBA1C 7.3 (A) 09/21/2018     Lipid Panel - today's lipid panel is lipemic per lab and is pending    Component Value Date/Time   CHOL 134 02/14/2020 1103   TRIG 613 (HH) 02/14/2020 1103   HDL 18 (L) 02/14/2020 1103   CHOLHDL 7.4 (H) 02/14/2020 1103   CHOLHDL 3.6 08/12/2017 0334   VLDL 31 08/12/2017 0334   LDLCALC 32 02/14/2020 1103   LABVLDL 84 (H) 02/14/2020 1103    DIETARY INTAKE: Usual eating pattern includes 2-3 meals and 2-3 snacks per day. Everyday foods include smoothies, fruit, cereal, coffee.  Avoided foods include regular soda, juice Dining Out (times/week): 3-5 ( [employer], [employer], [employer], [employer], [employer], [employer])  24-hr recall:  B (10 AM): cereal and 1-2% milk or 1-2 bananas with 1-2 slices toast, or biscuit with sausage, eggs, coffee;  canned biscuit and molasses, L ( PM): skips or has fruit, snack cake, bolgna sandwich Snk ( 3-4 PM): fast foods which is usually a sandwich and fries or can be fried chicken,  D (6-8 or 7-9 PM):canned biscuit and molasses, pork, steak or cubed steak, fish grilled and chicken, mashed potatoes and coleslaw, cabbage seasoned with ham, beans and cornbread Snk ( 12-1 A): orange, snack cake, popcorn Beverages: water, coffee, diet soda x1/day  Progress Towards Goal(s):  In progress.   Nutritional Diagnosis:  NB-1.1 Food and nutrition-related knowledge deficit As related to not being aware of calorie, carb and fat content of some foods.  As evidenced by his report and questions.    Intervention:  Nutrition education/review about the types of  fats and carb content of foods he is consuming and switches ne can make to help lower his weight, blood fat and sugar and  how it may affect his body's processing of cholesterol and blood glucose. Assisted with connecting his meter to his phone, signing up for mYchart and educated him about Trulicity and how to use the pen as well as Continuous glucose monitoring Educated about dawn phenomena and use of Continuous  glucose monitoring top determine if it is his nighttime snacking vs the dawn phenomia .Coordiantion of care- discussed patient care with Dr. Heber .  Action Goal: eat more low carb veggies instead of potatoes  Outcome goal: decreased weight and blood sugars Coordination of care:   Teaching Method Utilized: Visual, Auditory,Hands on Handouts given during visit include: tracking my progress Barriers to learning/adherence to lifestyle change: competing values Demonstrated degree of understanding via:  Teach Back   Monitoring/Evaluation:  Dietary intake, exercise, meter, and body weight in 4-8 week(s). Jason Velasquez, RD 09/18/2020 11:13 AM.

## 2020-09-19 ENCOUNTER — Ambulatory Visit: Payer: PPO | Admitting: Cardiovascular Disease

## 2020-09-19 ENCOUNTER — Telehealth: Payer: Self-pay | Admitting: *Deleted

## 2020-09-19 LAB — LIPID PANEL
Chol/HDL Ratio: 7.7 ratio — ABNORMAL HIGH (ref 0.0–5.0)
Cholesterol, Total: 116 mg/dL (ref 100–199)
HDL: 15 mg/dL — ABNORMAL LOW (ref >39)
LDL Chol Calc (NIH): 10 mg/dL (ref 0–99)
Triglycerides: 745 mg/dL (ref 0–149)
VLDL Cholesterol Cal: 91 mg/dL — ABNORMAL HIGH (ref 5–40)

## 2020-09-19 NOTE — Assessment & Plan Note (Signed)
Started seeing Dr Debara Pickett with [facility]. But did not get Lipid panel that was ordered, Will obtain one today,   Continue atorvastatin, vascepa, dietary education

## 2020-09-19 NOTE — Assessment & Plan Note (Signed)
HPI: reports sugars mostly good, occasionally higher in morning and throughout day, highest 400 but most less than 200.  Working on eating better, meeting with Butch Penny today as well.  No issues with medications.  A: T2DM above goal with PVD  P: Stop Januvia Sart Trulicity, provided 2 week samples of 0.75mg  weekly dose, then will increase to 1.5mg  weekly. Continue Synjardy CHeck Lipid panel

## 2020-09-19 NOTE — Telephone Encounter (Signed)
Per tracey in the lab please note triglycerides 745 from most recent lab

## 2020-09-23 ENCOUNTER — Ambulatory Visit: Payer: PPO | Admitting: Cardiovascular Disease

## 2020-09-23 ENCOUNTER — Encounter: Payer: Self-pay | Admitting: Cardiovascular Disease

## 2020-09-23 ENCOUNTER — Other Ambulatory Visit: Payer: Self-pay

## 2020-09-23 VITALS — BP 102/62 | HR 80 | Ht 71.0 in | Wt 212.2 lb

## 2020-09-23 DIAGNOSIS — Z951 Presence of aortocoronary bypass graft: Secondary | ICD-10-CM

## 2020-09-23 DIAGNOSIS — I2510 Atherosclerotic heart disease of native coronary artery without angina pectoris: Secondary | ICD-10-CM | POA: Diagnosis not present

## 2020-09-23 DIAGNOSIS — I119 Hypertensive heart disease without heart failure: Secondary | ICD-10-CM

## 2020-09-23 DIAGNOSIS — Z9861 Coronary angioplasty status: Secondary | ICD-10-CM | POA: Diagnosis not present

## 2020-09-23 DIAGNOSIS — I6523 Occlusion and stenosis of bilateral carotid arteries: Secondary | ICD-10-CM

## 2020-09-23 DIAGNOSIS — I10 Essential (primary) hypertension: Secondary | ICD-10-CM | POA: Diagnosis not present

## 2020-09-23 NOTE — Progress Notes (Signed)
Cardiology Office Note   Date:  09/23/2020   ID:  Male Jason Velasquez, DOB 02-03-1956, MRN [id number]  PCP:  [name], DO  Cardiologist:   Skeet Latch, MD   No chief complaint on file.       Patient ID: Jason Velasquez is a 64 y.o. male with CAD s/p CABG ( LIMA-->LAD, SVG-->PDA, SVG-->OM1), multiple PCIs , chronic stable angina, PAD, hypertension, diabetes and CVA who presents for follow up.  [name] was first evaluated for chest pain on 07/27/15.  He previously underwent CABG in 2007. He had a nuclear stress test on 08/21/15 revealed LVEF 57% with moderate ischemia in the inferoseptal, inferior, and anterior locations. He subsequently underwent left heart catheterization on 08/26/15 that revealed severe three vessel disease including a 90% left main lesion and an occluded RCA and LAD. His LIMA to the LAD was patent but all other vein grafts were occluded. There were no optimal PCI targets.  He was started on carvedilol and Ranexa. [name] also underwent ABI testing 08/2015 that revealed diffuse atherosclerosis from the proximal abdominal aorta through the common and external iliac arteries bilaterally. There was no detectable flow in the L peroneal or anterior tibial arteries. ABIs were near 0.8 bilaterally. He was referred to Dr. Gwenlyn Found where medical management was advised. He was noted to have carotid bruits and was referred for carotid ultrasound on 12/09/15.  This revealed 40-59% stenosis on the right and 1-39% stenosis on the left.  He had repeat LE Dopplers and ABIs 03/2017 that were unchanged from prior.  Medical management was recommended.    [name] continued to have worsening angina despite optimal medical therapy.  He was referred for [facility]  05/2017 where he was found to have progression of the distal LM/LAD/LCx lesion.  He underwent DES placement in the ostial LCx, mid LCx, and ostial LAD.  The ostial LAD was treated with a cutting ballpon reducing the stenosis from 85% to 30%.   It was recommended that he continue clopidogrel and aspirin indefinitely.  He had an echo 06/01/17 that showed LVEF 65-70% with grade 1 diastolic dysfunction.  He followed up on 07/2017 and continued to have angina especially at night.  There is some concern that it may also be GERD.  Therefore he was given a trial of pantoprazole and Imdur 60 mg daily was restarted.  He called our office 11/7 with progressive symptoms.  He was referred for cardiac catheterization but was found to be anemic with a hemoglobin of 7.7.  He was transfused and underwent upper endoscopy on 11/9.  He was found to have a hiatal hernia and 2 AVMs that were treated with APC.  He also received IV iron.  He subsequently underwent left heart catheterization that again showed severe three-vessel disease not amenable to PCI.  He continues to have refractory angina despite optimal medical therapy.  He had another left heart catheterization 01/25/2018 at which time his ostial LAD was found to be 99% stenosed.  A Sierra DES was successfully implanted.  [name] saw [name], NP on 06/2020 with chest pain. His symptoms were thought to be musculoskeletal.  Since then he has been doing well.  He thinks it was more related to shoulder pain.  He hasn't been getting much formal exercise.  He does get some occasional claudication in his left calf.  When this happens he stops walking rests for a little while and then starts again.  He has not had any  exertional chest pain or pressure.  His breathing has been stable.  He denies lower extremity edema, orthopnea, or PND.  He is trying to cut back on his carbohydrate intake.  He denies any lightheadedness or dizziness.   Past Medical History:  Diagnosis Date  . Angina, class III ([place]) 08/24/2015  . Arthritis    "hands" (01/25/2018)  . AVM (arteriovenous malformation) 08/15/2017  . CAD (coronary artery disease) of artery bypass graft    CABG- 2007   . Carotid stenosis 03/03/2016   09-60% RICA,  4-54% LICA, >09% RECA stenosis Repeat 12/2016.  . Claudication ([place])   . Controlled type 2 diabetes mellitus without complication ([place]) 05/04/1913   Dx March of 2016. GAD65 negative Anti Islet cell negative    . DKA (diabetic ketoacidoses) 12/07/2014  . Dyspnea   . History of blood transfusion ~ 08/2017   "related to LGIB"  . History of hiatal hernia   . Hx of adenomatous colonic polyps 05/27/2015  . Hypercholesterolemia   . Hypertension   . Hypertensive heart disease 03/03/2016  . 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 Left nephrolithiasis 12/07/2014  . Migraine    "only in my 30's" (01/25/2018)  . Non-ST elevation (NSTEMI) myocardial infarction ([place])   . Pneumonia ~ 1990  . Stroke [facility]) 07/12/2014   "I didn't realize I'd had one; hospital found it"; denies residual on 01/25/2018  . Tubular adenoma of colon 05/31/2015   Colonoscopy 05/20/15 with [name]: 3 tubullar adenoma's largest 2.3cm.  Plan for repeat Colonoscopy in 2019.    Past Surgical History:  Procedure Laterality Date  . CARDIAC CATHETERIZATION N/A 08/26/2015   Procedure: Left Heart Cath and Cors/Grafts Angiography;  Surgeon: [name], MD;  Location: [place] [facility];  Service: Cardiovascular;  Laterality: N/A;  . CORONARY ARTERY BYPASS GRAFT  2007   "CABG X3", at [facility]   . CORONARY BALLOON ANGIOPLASTY N/A 05/25/2017   Procedure: CORONARY BALLOON ANGIOPLASTY;  Surgeon: [name], MD;  Location: [place] [facility];  Service: Cardiovascular;  Laterality: N/A;  . CORONARY BALLOON ANGIOPLASTY N/A 01/25/2018   Procedure: CORONARY BALLOON ANGIOPLASTY;  Surgeon: [name], MD;  Location: [place] [facility];  Service: Cardiovascular;  Laterality: N/A;  . CORONARY STENT INTERVENTION N/A 05/25/2017   Procedure: CORONARY STENT INTERVENTION;  Surgeon: [name], MD;  Location: [place] [facility];  Service: Cardiovascular;  Laterality: N/A;  . CORONARY  STENT INTERVENTION N/A 01/25/2018   Procedure: CORONARY STENT INTERVENTION;  Surgeon: [name], MD;  Location: [place] [facility];  Service: Cardiovascular;  Laterality: N/A;  . ESOPHAGOGASTRODUODENOSCOPY N/A 08/12/2017   Procedure: ESOPHAGOGASTRODUODENOSCOPY (EGD);  Surgeon: Jerene Bears, MD;  Location: [facility] ENDOSCOPY;  Service: Gastroenterology;  Laterality: N/A;  . LEFT HEART CATH AND CORS/GRAFTS ANGIOGRAPHY N/A 05/23/2017   Procedure: LEFT HEART CATH AND CORS/GRAFTS ANGIOGRAPHY;  Surgeon: [name], MD;  Location: [place] [facility];  Service: Cardiovascular;  Laterality: N/A;  . LEFT HEART CATH AND CORS/GRAFTS ANGIOGRAPHY N/A 08/15/2017   Procedure: LEFT HEART CATH AND CORS/GRAFTS ANGIOGRAPHY;  Surgeon: Burnell Blanks, MD;  Location: [place] [facility];  Service: Cardiovascular;  Laterality: N/A;  . LEFT HEART CATH AND CORS/GRAFTS ANGIOGRAPHY N/A 01/25/2018   Procedure: LEFT HEART CATH AND CORS/GRAFTS ANGIOGRAPHY;  Surgeon: [name], MD;  Location: [place] [facility];  Service: Cardiovascular;  Laterality: N/A;    Current  Outpatient Medications  Medication Sig Dispense Refill  . acetaminophen (TYLENOL) 500 MG tablet Take 1,000 mg by mouth every 6 (six) hours as needed for moderate pain or headache.    Marland Kitchen aspirin EC 81 MG tablet Take 81 mg by mouth daily.    Marland Kitchen atorvastatin (LIPITOR) 80 MG tablet Take 1 tablet (80 mg total) by mouth every evening. 90 tablet 3  . clopidogrel (PLAVIX) 75 MG tablet Take 1 tablet by mouth once daily 90 tablet 3  . Dulaglutide (TRULICITY) 0.16 WF/0.9NA SOPN Inject 0.75 mg into the skin once a week for 14 days. 1 mL 0  . [START ON 10/02/2020] Dulaglutide (TRULICITY) 1.5 TF/5.7DU SOPN Inject 1.5 mg into the skin once a week. 2 mL 5  . Empagliflozin-metFORMIN HCl (SYNJARDY) 02-999 MG TABS Take 1 tablet by mouth 2 (two) times daily with a meal. 180 tablet 3  . ferrous sulfate 325 (65 FE) MG tablet Take 1 tablet (325 mg total) by mouth every  other day. 100 tablet 1  . glucose blood (ONETOUCH VERIO) test strip Use as instructed 100 each 11  . Hypromellose (ARTIFICIAL TEARS OP) Place 1 drop into both eyes daily as needed (for dry eyes).     Marland Kitchen icosapent Ethyl (VASCEPA) 1 g capsule Take 2 capsules (2 g total) by mouth 2 (two) times daily. (Patient taking differently: Take 1 g by mouth 2 (two) times daily.) 120 capsule 3  . isosorbide mononitrate (IMDUR) 120 MG 24 hr tablet TAKE 1/2 (ONE-HALF) TABLET BY MOUTH IN THE MORNING AND 1 TABLET IN THE EVENING 135 tablet 3  . Lancets (ONETOUCH DELICA PLUS [id number]) MISC 1 Device by Does not apply route in the morning and at bedtime. 100 each 11  . lisinopril (ZESTRIL) 10 MG tablet TAKE 1 TABLET BY MOUTH DAILY. 90 tablet 2  . metoprolol (TOPROL-XL) 200 MG 24 hr tablet Take 1 tablet (200 mg total) by mouth daily. 90 tablet 3  . nitroGLYCERIN (NITROSTAT) 0.4 MG SL tablet Place 1 tablet (0.4 mg total) under the tongue every 5 (five) minutes as needed for chest pain. 25 tablet 2  . ranolazine (RANEXA) 500 MG 12 hr tablet Take 500 mg by mouth 2 (two) times daily.     No current facility-administered medications for this visit.    Allergies:   Patient has no known allergies.    Social History:  The patient  reports that he quit smoking about 10 years ago. His smoking use included cigarettes. He has a 76.00 pack-year smoking history. He has never used smokeless tobacco. He reports current alcohol use. He reports that he does not use drugs.   Family History:  The patient's family history includes Crohn's disease in his daughter; Diabetes in his father; Hypertension in his mother.    ROS:  Please see the history of present illness.  Otherwise, review of systems are positive for none.   All other systems are reviewed and negative.    PHYSICAL EXAM: VS:  BP 102/62   Pulse 80   Ht 5\' 11"  (1.803 m)   Wt 212 lb 3.2 oz (96.3 kg)   SpO2 96%   BMI 29.60 kg/m  , BMI Body mass index is 29.6  kg/m. GENERAL:  Well appearing HEENT: Pupils equal round and reactive, fundi not visualized, oral mucosa unremarkable NECK:  No jugular venous distention, waveform within normal limits, carotid upstroke brisk and symmetric, no bruits, no thyromegaly LYMPHATICS:  No cervical adenopathy LUNGS:  Clear to auscultation bilaterally HEART:  RRR.  PMI not displaced or sustained,S1 and S2 within normal limits, no S3, no S4, no clicks, no rubs, no murmurs ABD:  Flat, positive bowel sounds normal in frequency in pitch, no bruits, no rebound, no guarding, no midline pulsatile mass, no hepatomegaly, no splenomegaly EXT:  2 plus radial pulses.  1+DP/TP bilaterally, no edema, no cyanosis no clubbing SKIN:  No rashes no nodules NEURO:  Cranial nerves II through XII grossly intact, motor grossly intact throughout PSYCH:  Cognitively intact, oriented to person place and time  EKG:  EKG is not ordered today. The ekg ordered today demonstrates sinus rhythm rate 82 bpm.  Prior inferior infarct.  07/02/16: Sinus rhythm rate 75 bpm.  Prior inferior infarct  12/29/16: Sinus rhythm.  Incomplete RBBB.  Prior inferior infarct.  05/18/17: Sinus rhythm. Rate 87 bpm. B 1 PVCs. 07/18/17: Sinus rhythm.  Rate 71 bpm.  08/11/17: Sinus rhythm.  Rate 77 bpm.    07/30/19: Sinus rhythm.  Rate 71 bpm.  Prior inferior infarct 01/30/2020: Sinus rhythm.  Rate 79 bpm.  Prior inferior infarct.  LHC 08/15/17:  Mid RCA lesion is 100% stenosed.  Ost RCA to Prox RCA lesion is 70% stenosed.  Ost 1st Mrg lesion is 60% stenosed.  Previously placed Ost 1st Mrg to 1st Mrg stent (unknown type) is widely patent.  Origin lesion is 100% stenosed.  Origin lesion is 100% stenosed.  Prox Graft lesion is 20% stenosed.  Prox LAD to Mid LAD lesion is 100% stenosed.  Previously placed Ost Cx to Prox Cx stent (unknown type) is widely patent.  Previously placed Ost LM to Dist LM stent (unknown type) is widely patent.  Previously placed Prox  Cx to Mid Cx stent (unknown type) is widely patent.  SVG graft was not visualized.  SVG graft was not visualized.  Post intervention, there is a -1% residual stenosis.  LIMA graft was visualized by angiography and is normal in caliber.  The graft exhibits minimal luminal irregularities.  Post intervention, there is a -1% residual stenosis.  Ost LAD to Prox LAD lesion is 99% stenosed.  Post intervention, there is a -1% residual stenosis.   1. Severe triple vessel CAD s/p CABG with 1/3 patent bypass grafts.  2. The LAD is known to be chronically occluded in the mid segment. The mid and distal LAD fills from the patent LIMA graft. The ostial LAD is jailed by the stent extending from the left main into the Circumflex. The proximal LAD has a 99% stenosis following by an aneurysmal segment then a total occlusion.  3. The left main stent is patent 4. The ostial/proximal Circumflex stent is patent. The first OM stent is patent. There is a 60% ostial stenosis in the first OM branch just prior to the stent. The SVG to the OM is known to be occluded. The mid Circumflex stent is patent.  5. The native RCA has a long 70% proximal stenosis and 100% mid chronic occlusion. The vein graft the to distal RCA is known to be occluded and was not injected.  6. Normal filling pressures  Recommendations: Continue medical management of CAD. No focal targets for PCI. The proximal LAD stenosis is severe but the majority of this territory is supplied by the LIMA graft and the LAD is jailed by the LM/Circ stent. I suspect that his anemia has contributed to his recent chest pain given his severe, diffuse CAD.    Echo 06/01/17: Study Conclusions  - Procedure narrative: Transthoracic echocardiography. Image  quality was poor. Intravenous contrast (Definity) was   administered. - Left ventricle: The cavity size was normal. There was mild focal   basal hypertrophy of the septum. Systolic function was vigorous.    The estimated ejection fraction was in the range of 65% to 70%.   Wall motion was normal; there were no regional wall motion   abnormalities. Doppler parameters are consistent with abnormal   left ventricular relaxation (grade 1 diastolic dysfunction). - Aortic valve: Trileaflet; mildly thickened, mildly calcified   leaflets. - Left atrium: The atrium was mildly dilated.   Lexiscan Cardiolite 08/21/15:  Nuclear stress EF: 57%.  The left ventricular ejection fraction is normal (55-65%).  There was no ST segment deviation noted during stress.  No T wave inversion was noted during stress.  Defect 1: There is a small defect of moderate severity present in the basal inferoseptal and basal inferior location.  Defect 2: There is a defect present in the basal anterior, mid anterior and apical anterior location.  Findings consistent with ischemia.  This is a high risk study.  High risk stress nuclear study with two areas of ischemia and normal left ventricular regional and global systolic function. The inferior area of ischemia is small. The anterior area of ischemia is extensive, but appears to be very mild. The septum is spared.  Carotid Doppler 12/11/15: 70-26% RICA, 3-78% LICA, >58% RECA stenosis  ABI 08/12/15: R 0.81, L 0.89 Diffuse atherosclerosis from the proximal abdominal aorta into both tibial arteries. Several areas of narrowing are noted in both lower extremities, although no focal significant stenosis is identified. One vessel run-off on the right, and three vessel run-off on the left.  ABI 02/03/17: R 0.81, L 0.91 Abnormal lower arterial Doppler study, with waveforms suggestive of inflow disease. The bilateral ABI's are stable and in the mild range. The right great toe pressure is normal. The left great toe pressure is abnormal   Recent Labs: 01/30/2020: ALT 30 02/14/2020: BUN 12; Creatinine, Ser 1.13; Potassium 4.6; Sodium 135    Lipid Panel    Component Value  Date/Time   CHOL 116 09/18/2020 0840   TRIG 745 (HH) 09/18/2020 0840   HDL 15 (L) 09/18/2020 0840   CHOLHDL 7.7 (H) 09/18/2020 0840   CHOLHDL 3.6 08/12/2017 0334   VLDL 31 08/12/2017 0334   LDLCALC 10 09/18/2020 0840      Wt Readings from Last 3 Encounters:  09/23/20 212 lb 3.2 oz (96.3 kg)  09/18/20 209 lb 8 oz (95 kg)  06/13/20 208 lb 9.6 oz (94.6 kg)      ASSESSMENT AND PLAN:  # CAD s/p CABG:  # Angina:  Mr. Coon has severe 3 vessel CAD and all his CABG grafts are occluded with the exception of his LIMA to the LAD.  He underwent successful PCI of the LM, LCx and OM1.  Ostial LAD was 99% stenosed 01/2018 and he had a successful DES implanted 01/2018.  He continues to do well.  Continue aspirin, atorvastatin, clopidogrel, Imdur, lisinopril, metoprolol and ranolazine.  Lifelong DAPT.  He was encouraged to start back exercising.  He does have an opportunity to join [employer] and he wants to do this after the new year.  # Hypertension: Blood pressure well have been controlled.  In fact, his blood pressure is low.  However he denies any lightheadedness or dizziness.  Continue lisinopril, metoprolol, and Imdur.  # Hyperlipidemia: LDL is well have been controlled.  However his HDL is low and  his triglycerides are high.  He was seen by Dr. Debara Pickett and he does not have any additional recommendations.  We will continue atorvastatin and Vascepa.  Encouraged him to increase his exercise and work on limiting carbohydrate intake.  # Claudication: # PAD:  ABIs 0.8 bilaterally and medical management was recommended in 2016.  Unchanged on repeat.  We did recommend a walking program.  He plans to start at [employer] and to get an exercise bike.  Continue aspirin, atorvastatin, and clopidogrel.  # Carotid stenosis: Carotid Dopplers 02/2017 revealed 50-49% R ICA stenosis and 1-39% stenosis on the left.  Continue aspirin and atorvastatin.  Repeat carotid Dopplers stable 02/2020.  Current  medicines are reviewed at length with the patient today.  The patient does not have concerns regarding medicines.  The following changes have been made:  none  Labs/ tests ordered today include:   No orders of the defined types were placed in this encounter.    Disposition:   FU with Effie Wahlert C. Oval Linsey, MD in 6 months.    Signed, Skeet Latch, MD  09/23/2020 4:46 PM    [place]

## 2020-09-23 NOTE — Telephone Encounter (Signed)
noted 

## 2020-09-23 NOTE — Patient Instructions (Signed)
Medication Instructions:  Your physician recommends that you continue on your current medications as directed. Please refer to the Current Medication list given to you today.  *If you need a refill on your cardiac medications before your next appointment, please call your pharmacy*  Lab Work: NONE  Testing/Procedures: NONE  Follow-Up: At [facility], you and your health needs are our priority.  As part of our continuing mission to provide you with exceptional heart care, we have created designated Provider Care Teams.  These Care Teams include your primary Cardiologist (physician) and Advanced Practice Providers (APPs -  Physician Assistants and Nurse Practitioners) who all work together to provide you with the care you need, when you need it.  We recommend signing up for the patient portal called "MyChart".  Sign up information is provided on this After Visit Summary.  MyChart is used to connect with patients for Virtual Visits (Telemedicine).  Patients are able to view lab/test results, encounter notes, upcoming appointments, etc.  Non-urgent messages can be sent to your provider as well.   To learn more about what you can do with MyChart, go to [email or web address].    Your next appointment:   6 month(s)  The format for your next appointment:   In Person  Provider:   You may see Skeet Latch, MD or one of the following Advanced Practice Providers on your designated Care Team:    Kerin Ransom, PA-C  [place], [place]  [name], [place]    Other Instructions  LIMIT YOUR CARBOHYDRATES  TRY TO EXERCISE [place]

## 2020-10-06 ENCOUNTER — Other Ambulatory Visit: Payer: Self-pay | Admitting: Cardiovascular Disease

## 2020-10-06 MED FILL — LISINOPRIL 10 MG TABS: 10 | 90 days supply | Qty: 90 | Fill #0

## 2020-10-08 DIAGNOSIS — E119 Type 2 diabetes mellitus without complications: Secondary | ICD-10-CM | POA: Diagnosis not present

## 2020-10-08 DIAGNOSIS — H2513 Age-related nuclear cataract, bilateral: Secondary | ICD-10-CM | POA: Diagnosis not present

## 2020-10-12 ENCOUNTER — Other Ambulatory Visit: Payer: Self-pay | Admitting: Cardiovascular Disease

## 2020-11-23 ENCOUNTER — Other Ambulatory Visit: Payer: Self-pay | Admitting: Cardiovascular Disease

## 2021-01-23 ENCOUNTER — Other Ambulatory Visit (HOSPITAL_COMMUNITY): Payer: Self-pay

## 2021-01-23 MED FILL — Lisinopril Tab 10 MG: ORAL | 90 days supply | Qty: 90 | Fill #0 | Status: AC

## 2021-02-09 ENCOUNTER — Other Ambulatory Visit (HOSPITAL_COMMUNITY): Payer: Self-pay | Admitting: Cardiovascular Disease

## 2021-02-09 ENCOUNTER — Other Ambulatory Visit: Payer: Self-pay

## 2021-02-09 ENCOUNTER — Ambulatory Visit (HOSPITAL_BASED_OUTPATIENT_CLINIC_OR_DEPARTMENT_OTHER)
Admission: RE | Admit: 2021-02-09 | Discharge: 2021-02-09 | Disposition: A | Discharge status: Home / Self Care | Payer: PPO | Source: Ambulatory Visit | Attending: Cardiovascular Disease | Admitting: Cardiovascular Disease

## 2021-02-09 ENCOUNTER — Ambulatory Visit (HOSPITAL_COMMUNITY)
Admission: RE | Admit: 2021-02-09 | Discharge: 2021-02-09 | Disposition: A | Discharge status: Home / Self Care | Payer: PPO | Source: Ambulatory Visit | Attending: Internal Medicine | Admitting: Internal Medicine

## 2021-02-09 DIAGNOSIS — I6523 Occlusion and stenosis of bilateral carotid arteries: Secondary | ICD-10-CM

## 2021-02-09 DIAGNOSIS — I739 Peripheral vascular disease, unspecified: Secondary | ICD-10-CM

## 2021-02-17 ENCOUNTER — Other Ambulatory Visit: Payer: Self-pay

## 2021-02-17 MED ORDER — ATORVASTATIN CALCIUM 80 MG PO TABS: 80.0000 mg | ORAL_TABLET | Freq: Every evening | ORAL | 3 refills | Status: DC

## 2021-02-17 NOTE — Telephone Encounter (Signed)
Pt is requesting his atorvastatin (LIPITOR) 80 MG tablet(Expired) sent to  [facility] ([place]), [place] - [address] Phone:  [phone]  Fax:  [phone]      ( pt stated that he is completely out of his medicine )

## 2021-03-04 ENCOUNTER — Encounter: Payer: Self-pay | Admitting: Cardiovascular Disease

## 2021-03-04 ENCOUNTER — Ambulatory Visit: Payer: PPO | Admitting: Cardiovascular Disease

## 2021-03-04 ENCOUNTER — Other Ambulatory Visit: Payer: Self-pay

## 2021-03-04 VITALS — BP 124/78 | HR 85 | Ht 71.0 in | Wt 203.2 lb

## 2021-03-04 DIAGNOSIS — I208 Other forms of angina pectoris: Secondary | ICD-10-CM

## 2021-03-04 NOTE — Patient Instructions (Signed)
Medication Instructions:  Your physician recommends that you continue on your current medications as directed. Please refer to the Current Medication list given to you today.   *If you need a refill on your cardiac medications before your next appointment, please call your pharmacy*  Lab Work: HAVE YOUR PRIMARY SEND WHEN YOU HAVE DONE   Testing/Procedures: NONE   Follow-Up: At [facility], you and your health needs are our priority.  As part of our continuing mission to provide you with exceptional heart care, we have created designated Provider Care Teams.  These Care Teams include your primary Cardiologist (physician) and Advanced Practice Providers (APPs -  Physician Assistants and Nurse Practitioners) who all work together to provide you with the care you need, when you need it.  We recommend signing up for the patient portal called "MyChart".  Sign up information is provided on this After Visit Summary.  MyChart is used to connect with patients for Virtual Visits (Telemedicine).  Patients are able to view lab/test results, encounter notes, upcoming appointments, etc.  Non-urgent messages can be sent to your provider as well.   To learn more about what you can do with MyChart, go to [email or web address].    Your next appointment:   6 month(s)  The format for your next appointment:   In Person  Provider:   DR [place]

## 2021-03-04 NOTE — Progress Notes (Signed)
Cardiology Office Note   Date:  03/04/2021   ID:  Tell Jason Velasquez, DOB 10/03/1956, MRN [id number]  PCP:  [name], DO  Cardiologist:   Skeet Latch, MD   No chief complaint on file.       Patient ID: Jason Velasquez is a 65 y.o. male with CAD s/p CABG ( LIMA-->LAD, SVG-->PDA, SVG-->OM1), multiple PCIs , chronic stable angina, PAD, hypertension, diabetes and CVA who presents for follow up.  [name] was first evaluated for chest pain on 07/27/15.  He previously underwent CABG in 2007. He had a nuclear stress test on 08/21/15 revealed LVEF 57% with moderate ischemia in the inferoseptal, inferior, and anterior locations. He subsequently underwent left heart catheterization on 08/26/15 that revealed severe three vessel disease including a 90% left main lesion and an occluded RCA and LAD. His LIMA to the LAD was patent but all other vein grafts were occluded. There were no optimal PCI targets.  He was started on carvedilol and Ranexa. Jason Velasquez also underwent ABI testing 08/2015 that revealed diffuse atherosclerosis from the proximal abdominal aorta through the common and external iliac arteries bilaterally. There was no detectable flow in the L peroneal or anterior tibial arteries. ABIs were near 0.8 bilaterally. He was referred to Dr. Gwenlyn Velasquez where medical management was advised. He was noted to have carotid bruits and was referred for carotid ultrasound on 12/09/15.  This revealed 40-59% stenosis on the right and 1-39% stenosis on the left.  He had repeat LE Dopplers and ABIs 03/2017 that were unchanged from prior.  Medical management was recommended.    Mr. Berendt continued to have worsening angina despite optimal medical therapy.  He was referred for [facility]  05/2017 where he was Velasquez to have progression of the distal LM/LAD/LCx lesion.  He underwent DES placement in the ostial LCx, mid LCx, and ostial LAD.  The ostial LAD was treated with a cutting ballpon reducing the stenosis from 85% to 30%.   It was recommended that he continue clopidogrel and aspirin indefinitely.  He had an echo 06/01/17 that showed LVEF 65-70% with grade 1 diastolic dysfunction.  He followed up on 07/2017 and continued to have angina especially at night.  There is some concern that it may also be GERD.  Therefore he was given a trial of pantoprazole and Imdur 60 mg daily was restarted.  He called our office 11/7 with progressive symptoms.  He was referred for cardiac catheterization but was Velasquez to be anemic with a hemoglobin of 7.7.  He was transfused and underwent upper endoscopy on 11/9.  He was Velasquez to have a hiatal hernia and 2 AVMs that were treated with APC.  He also received IV iron.  He subsequently underwent left heart catheterization that again showed severe three-vessel disease not amenable to PCI.  He continues to have refractory angina despite optimal medical therapy.  He had another left heart catheterization 01/25/2018 at which time his ostial LAD was Velasquez to be 99% stenosed.  A Sierra DES was successfully implanted.  Jason Velasquez saw Jason Memos, NP on 06/2020 with chest pain. His symptoms were thought to be musculoskeletal.  At his last appointment he was doing well.  He had repeat ABIs and carotid Dopplers 02/2021 that were unchanged from prior.  He has been trying to work on his diet eating more fruits and veggies.  He is also eating more fish.  He is wondering why his triglycerides are so high.  He has been  limiting processed carbs.  He generally feels well.  If he pushes himself he gets angina that is alleviated with rest.  He has been trying to get some exercise.  He mows the lawn.  He is able to walk for about 10 minutes and then has to stop due to pain in his legs.  After he rests he is able to finish the lawn without difficulty.  He notes that when he does consistently his exercise capacity always increases.  He reports that he has not been always taking his Vascepa lately.  The pills are large and they make  him gag.  He has been taking his other medications as prescribed.  When he checks his blood pressure at home and is always well have been controlled.  It is typically lower than it is here today.   Past Medical History:  Diagnosis Date  . Angina, class III ([place]) 08/24/2015  . Arthritis    "hands" (01/25/2018)  . AVM (arteriovenous malformation) 08/15/2017  . CAD (coronary artery disease) of artery bypass graft    CABG- 2007   . Carotid stenosis 03/03/2016   40-98% RICA, 1-19% LICA, >14% RECA stenosis Repeat 12/2016.  . Claudication ([place])   . Controlled type 2 diabetes mellitus without complication ([place]) 04/11/2955   Dx March of 2016. GAD65 negative Anti Islet cell negative    . DKA (diabetic ketoacidoses) 12/07/2014  . Dyspnea   . History of blood transfusion ~ 08/2017   "related to LGIB"  . History of hiatal hernia   . Hx of adenomatous colonic polyps 05/27/2015  . Hypercholesterolemia   . Hypertension   . Hypertensive heart disease 03/03/2016  . 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 Left nephrolithiasis 12/07/2014  . Migraine    "only in my 30's" (01/25/2018)  . Non-ST elevation (NSTEMI) myocardial infarction ([place])   . Pneumonia ~ 1990  . Stroke [facility]) 07/12/2014   "I didn't realize I'd had one; hospital Velasquez it"; denies residual on 01/25/2018  . Tubular adenoma of colon 05/31/2015   Colonoscopy 05/20/15 with [name]: 3 tubullar adenoma's largest 2.3cm.  Plan for repeat Colonoscopy in 2019.    Past Surgical History:  Procedure Laterality Date  . CARDIAC CATHETERIZATION N/A 08/26/2015   Procedure: Left Heart Cath and Cors/Grafts Angiography;  Surgeon: [name], MD;  Location: [place] CV [facility];  Service: Cardiovascular;  Laterality: N/A;  . CORONARY ARTERY BYPASS GRAFT  2007   "CABG X3", at [facility]   . CORONARY BALLOON ANGIOPLASTY N/A 05/25/2017   Procedure: CORONARY BALLOON ANGIOPLASTY;  Surgeon: [name],  MD;  Location: [place] CV LAB;  Service: Cardiovascular;  Laterality: N/A;  . CORONARY BALLOON ANGIOPLASTY N/A 01/25/2018   Procedure: CORONARY BALLOON ANGIOPLASTY;  Surgeon: [name], MD;  Location: [place] [facility];  Service: Cardiovascular;  Laterality: N/A;  . CORONARY STENT INTERVENTION N/A 05/25/2017   Procedure: CORONARY STENT INTERVENTION;  Surgeon: [name], MD;  Location: [place] [facility] LAB;  Service: Cardiovascular;  Laterality: N/A;  . CORONARY STENT INTERVENTION N/A 01/25/2018   Procedure: CORONARY STENT INTERVENTION;  Surgeon: [name], MD;  Location: [place] [facility];  Service: Cardiovascular;  Laterality: N/A;  . ESOPHAGOGASTRODUODENOSCOPY N/A 08/12/2017   Procedure: ESOPHAGOGASTRODUODENOSCOPY (EGD);  Surgeon: Jerene Bears, MD;  Location: [facility] ENDOSCOPY;  Service: Gastroenterology;  Laterality: N/A;  . LEFT HEART CATH AND CORS/GRAFTS ANGIOGRAPHY N/A 05/23/2017  Procedure: LEFT HEART CATH AND CORS/GRAFTS ANGIOGRAPHY;  Surgeon: [name], MD;  Location: [place] [facility];  Service: Cardiovascular;  Laterality: N/A;  . LEFT HEART CATH AND CORS/GRAFTS ANGIOGRAPHY N/A 08/15/2017   Procedure: LEFT HEART CATH AND CORS/GRAFTS ANGIOGRAPHY;  Surgeon: Burnell Blanks, MD;  Location: [place] [facility];  Service: Cardiovascular;  Laterality: N/A;  . LEFT HEART CATH AND CORS/GRAFTS ANGIOGRAPHY N/A 01/25/2018   Procedure: LEFT HEART CATH AND CORS/GRAFTS ANGIOGRAPHY;  Surgeon: [name], MD;  Location: [place] [facility];  Service: Cardiovascular;  Laterality: N/A;    Current Outpatient Medications  Medication Sig Dispense Refill  . acetaminophen (TYLENOL) 500 MG tablet Take 1,000 mg by mouth every 6 (six) hours as needed for moderate pain or headache.    Marland Kitchen aspirin EC 81 MG tablet Take 81 mg by mouth daily.    Marland Kitchen atorvastatin (LIPITOR) 80 MG tablet Take 1 tablet (80 mg total) by mouth every evening. 90 tablet 3  . clopidogrel (PLAVIX) 75 MG  tablet Take 1 tablet by mouth once daily 90 tablet 3  . Dulaglutide (TRULICITY) 1.5 HE/1.7EY SOPN Inject 1.5 mg into the skin once a week. 2 mL 5  . Empagliflozin-metFORMIN HCl (SYNJARDY) 02-999 MG TABS Take 1 tablet by mouth 2 (two) times daily with a meal. 180 tablet 3  . ferrous sulfate 325 (65 FE) MG tablet Take 1 tablet (325 mg total) by mouth every other day. 100 tablet 1  . glucose blood (ONETOUCH VERIO) test strip Use as instructed 100 each 11  . Hypromellose (ARTIFICIAL TEARS OP) Place 1 drop into both eyes daily as needed (for dry eyes).     . isosorbide mononitrate (IMDUR) 120 MG 24 hr tablet TAKE 1/2 (ONE-HALF) TABLET BY MOUTH IN THE MORNING AND 1 TABLET IN THE EVENING 135 tablet 3  . Lancets (ONETOUCH DELICA PLUS [id number]) MISC 1 Device by Does not apply route in the morning and at bedtime. 100 each 11  . lisinopril (ZESTRIL) 10 MG tablet TAKE 1 TABLET BY MOUTH DAILY. 90 tablet 2  . metoprolol (TOPROL-XL) 200 MG 24 hr tablet Take 1 tablet (200 mg total) by mouth daily. 90 tablet 3  . nitroGLYCERIN (NITROSTAT) 0.4 MG SL tablet Place 1 tablet (0.4 mg total) under the tongue every 5 (five) minutes as needed for chest pain. 25 tablet 2  . ranolazine (RANEXA) 500 MG 12 hr tablet Take 500 mg by mouth 2 (two) times daily.    Marland Kitchen VASCEPA 1 g capsule Take 2 capsules by mouth twice daily 120 capsule 3   No current facility-administered medications for this visit.    Allergies:   Patient has no known allergies.    Social History:  The patient  reports that he quit smoking about 11 years ago. His smoking use included cigarettes. He has a 76.00 pack-year smoking history. He has never used smokeless tobacco. He reports current alcohol use. He reports that he does not use drugs.   Family History:  The patient's family history includes Crohn's disease in his daughter; Diabetes in his father; Hypertension in his mother.    ROS:  Please see the history of present illness.  Otherwise, review of  systems are positive for none.   All other systems are reviewed and negative.    PHYSICAL EXAM: VS:  BP 124/78   Pulse 85   Ht 5\' 11"  (1.803 m)   Wt 203 lb 3.2 oz (92.2 kg)   SpO2 96%  BMI 28.34 kg/m  , BMI Body mass index is 28.34 kg/m. GENERAL:  Well appearing HEENT: Pupils equal round and reactive, fundi not visualized, oral mucosa unremarkable NECK:  No jugular venous distention, waveform within normal limits, carotid upstroke brisk and symmetric, no bruits, no thyromegaly LYMPHATICS:  No cervical adenopathy LUNGS:  Clear to auscultation bilaterally HEART:  RRR.  PMI not displaced or sustained,S1 and S2 within normal limits, no S3, no S4, no clicks, no rubs, no murmurs ABD:  Flat, positive bowel sounds normal in frequency in pitch, no bruits, no rebound, no guarding, no midline pulsatile mass, no hepatomegaly, no splenomegaly EXT:  2 plus radial pulses.  1+DP/TP bilaterally, no edema, no cyanosis no clubbing SKIN:  No rashes no nodules NEURO:  Cranial nerves II through XII grossly intact, motor grossly intact throughout PSYCH:  Cognitively intact, oriented to person place and time  EKG:  EKG is ordered today. The ekg ordered today demonstrates sinus rhythm rate 82 bpm.  Prior inferior infarct.  07/02/16: Sinus rhythm rate 75 bpm.  Prior inferior infarct  12/29/16: Sinus rhythm.  Incomplete RBBB.  Prior inferior infarct.  05/18/17: Sinus rhythm. Rate 87 bpm. B 1 PVCs. 07/18/17: Sinus rhythm.  Rate 71 bpm.  08/11/17: Sinus rhythm.  Rate 77 bpm.    07/30/19: Sinus rhythm.  Rate 71 bpm.  Prior inferior infarct 01/30/2020: Sinus rhythm.  Rate 79 bpm.  Prior inferior infarct.   LHC 08/15/17:  Mid RCA lesion is 100% stenosed.  Ost RCA to Prox RCA lesion is 70% stenosed.  Ost 1st Mrg lesion is 60% stenosed.  Previously placed Ost 1st Mrg to 1st Mrg stent (unknown type) is widely patent.  Origin lesion is 100% stenosed.  Origin lesion is 100% stenosed.  Prox Graft lesion is  20% stenosed.  Prox LAD to Mid LAD lesion is 100% stenosed.  Previously placed Ost Cx to Prox Cx stent (unknown type) is widely patent.  Previously placed Ost LM to Dist LM stent (unknown type) is widely patent.  Previously placed Prox Cx to Mid Cx stent (unknown type) is widely patent.  SVG graft was not visualized.  SVG graft was not visualized.  Post intervention, there is a -1% residual stenosis.  LIMA graft was visualized by angiography and is normal in caliber.  The graft exhibits minimal luminal irregularities.  Post intervention, there is a -1% residual stenosis.  Ost LAD to Prox LAD lesion is 99% stenosed.  Post intervention, there is a -1% residual stenosis.   1. Severe triple vessel CAD s/p CABG with 1/3 patent bypass grafts.  2. The LAD is known to be chronically occluded in the mid segment. The mid and distal LAD fills from the patent LIMA graft. The ostial LAD is jailed by the stent extending from the left main into the Circumflex. The proximal LAD has a 99% stenosis following by an aneurysmal segment then a total occlusion.  3. The left main stent is patent 4. The ostial/proximal Circumflex stent is patent. The first OM stent is patent. There is a 60% ostial stenosis in the first OM branch just prior to the stent. The SVG to the OM is known to be occluded. The mid Circumflex stent is patent.  5. The native RCA has a long 70% proximal stenosis and 100% mid chronic occlusion. The vein graft the to distal RCA is known to be occluded and was not injected.  6. Normal filling pressures  Recommendations: Continue medical management of CAD. No focal targets for  PCI. The proximal LAD stenosis is severe but the majority of this territory is supplied by the LIMA graft and the LAD is jailed by the LM/Circ stent. I suspect that his anemia has contributed to his recent chest pain given his severe, diffuse CAD.    Echo 06/01/17: Study Conclusions  - Procedure narrative:  Transthoracic echocardiography. Image   quality was poor. Intravenous contrast (Definity) was   administered. - Left ventricle: The cavity size was normal. There was mild focal   basal hypertrophy of the septum. Systolic function was vigorous.   The estimated ejection fraction was in the range of 65% to 70%.   Wall motion was normal; there were no regional wall motion   abnormalities. Doppler parameters are consistent with abnormal   left ventricular relaxation (grade 1 diastolic dysfunction). - Aortic valve: Trileaflet; mildly thickened, mildly calcified   leaflets. - Left atrium: The atrium was mildly dilated.   Lexiscan Cardiolite 08/21/15:  Nuclear stress EF: 57%.  The left ventricular ejection fraction is normal (55-65%).  There was no ST segment deviation noted during stress.  No T wave inversion was noted during stress.  Defect 1: There is a small defect of moderate severity present in the basal inferoseptal and basal inferior location.  Defect 2: There is a defect present in the basal anterior, mid anterior and apical anterior location.  Findings consistent with ischemia.  This is a high risk study.  High risk stress nuclear study with two areas of ischemia and normal left ventricular regional and global systolic function. The inferior area of ischemia is small. The anterior area of ischemia is extensive, but appears to be very mild. The septum is spared.  Carotid Doppler 12/11/15: 06-23% RICA, 7-62% LICA, >83% RECA stenosis  ABI 08/12/15: R 0.81, L 0.89 Diffuse atherosclerosis from the proximal abdominal aorta into both tibial arteries. Several areas of narrowing are noted in both lower extremities, although no focal significant stenosis is identified. One vessel run-off on the right, and three vessel run-off on the left.  ABI 02/03/17: R 0.81, L 0.91 Abnormal lower arterial Doppler study, with waveforms suggestive of inflow disease. The bilateral ABI's are stable  and in the mild range. The right great toe pressure is normal. The left great toe pressure is abnormal   Recent Labs: No results Velasquez for requested labs within last 8760 hours.    Lipid Panel    Component Value Date/Time   CHOL 116 09/18/2020 0840   TRIG 745 (HH) 09/18/2020 0840   HDL 15 (L) 09/18/2020 0840   CHOLHDL 7.7 (H) 09/18/2020 0840   CHOLHDL 3.6 08/12/2017 0334   VLDL 31 08/12/2017 0334   LDLCALC 10 09/18/2020 0840      Wt Readings from Last 3 Encounters:  03/04/21 203 lb 3.2 oz (92.2 kg)  09/23/20 212 lb 3.2 oz (96.3 kg)  09/18/20 209 lb 8 oz (95 kg)      ASSESSMENT AND PLAN:  # CAD s/p CABG:  # Angina:  Mr. Law has severe 3 vessel CAD and all his CABG grafts are occluded with the exception of his LIMA to the LAD.  He underwent successful PCI of the LM, LCx and OM1.  Ostial LAD was 99% stenosed 01/2018 and he had a successful DES implanted 01/2018.  He is doing well.  He does have stable angina if he pushes himself too much but this is rare.  It is always alleviated with rest.  He has not needed to use any nitroglycerin.  Continue aspirin, atorvastatin, Imdur, metoprolol, and Vascepa.  # Hypertension: Blood pressure is very well-controlled.  Continue metoprolol, lisinopril, and Imdur.  He is excited to start exercising and buy a new bike soon.  # Hyperlipidemia: LDL is well have been controlled.  History glycerides remain high.  He notes that he has not been very consistent with taking the Vascepa.  We discussed the importance of this.  He is scheduled to have lipids rechecked with his PCP next month.  If history concerns are still elevated we will need to add fenofibrate.  Continue atorvastatin.  # Claudication: # PAD:  ABIs 0.8 bilaterally and medical management was recommended in 2016.  Unchanged on repeat.  He is eager to increase his exercise and was encouraged to continue walking program.  # Carotid stenosis: Carotid Dopplers 02/2017 revealed 50-49% R ICA  stenosis and 1-39% stenosis on the left.  Unchanged 02/2021.  Continue aspirin and atorvastatin.  Repeat carotid   Current medicines are reviewed at length with the patient today.  The patient does not have concerns regarding medicines.  The following changes have been made:  none  Labs/ tests ordered today include:   No orders of the defined types were placed in this encounter.    Disposition:   FU with Jaylina Ramdass C. Oval Linsey, MD in 6 months.    Signed, Skeet Latch, MD  03/04/2021 2:28 PM    [place]

## 2021-03-10 ENCOUNTER — Other Ambulatory Visit: Payer: Self-pay

## 2021-03-10 MED ORDER — ONETOUCH DELICA PLUS LANCET33G MISC: 1.0000 | Freq: Two times a day (BID) | 11 refills | Status: DC

## 2021-03-13 ENCOUNTER — Other Ambulatory Visit: Payer: Self-pay | Admitting: *Deleted

## 2021-03-13 MED ORDER — ONETOUCH VERIO VI STRP: ORAL_STRIP | 11 refills | Status: DC

## 2021-03-17 ENCOUNTER — Other Ambulatory Visit: Payer: Self-pay

## 2021-03-17 MED ORDER — METOPROLOL SUCCINATE ER 200 MG PO TB24: 200.0000 mg | ORAL_TABLET | Freq: Every day | ORAL | 3 refills | Status: DC

## 2021-03-26 ENCOUNTER — Ambulatory Visit: Payer: PPO | Admitting: Cardiovascular Disease

## 2021-03-30 ENCOUNTER — Other Ambulatory Visit: Payer: Self-pay

## 2021-03-30 NOTE — Telephone Encounter (Signed)
  Dulaglutide (TRULICITY) 1.5 WG/9.5AO SOPN, refill request @  [facility] ([place]), [place] - [address] Phone:  [phone]  Fax:  [phone]

## 2021-03-31 MED ORDER — TRULICITY 1.5 MG/0.5ML ~~LOC~~ SOAJ: 1.5000 mg | SUBCUTANEOUS | 5 refills | Status: DC

## 2021-04-07 ENCOUNTER — Encounter: Payer: Self-pay | Admitting: *Deleted

## 2021-04-09 ENCOUNTER — Ambulatory Visit (INDEPENDENT_AMBULATORY_CARE_PROVIDER_SITE_OTHER): Payer: PPO | Admitting: Internal Medicine

## 2021-04-09 ENCOUNTER — Other Ambulatory Visit: Payer: Self-pay

## 2021-04-09 VITALS — BP 133/74 | HR 88 | Temp 97.9°F | Ht 71.0 in | Wt 204.5 lb

## 2021-04-09 DIAGNOSIS — I739 Peripheral vascular disease, unspecified: Secondary | ICD-10-CM | POA: Diagnosis not present

## 2021-04-09 DIAGNOSIS — E1151 Type 2 diabetes mellitus with diabetic peripheral angiopathy without gangrene: Secondary | ICD-10-CM

## 2021-04-09 DIAGNOSIS — E785 Hyperlipidemia, unspecified: Secondary | ICD-10-CM | POA: Diagnosis not present

## 2021-04-09 DIAGNOSIS — I10 Essential (primary) hypertension: Secondary | ICD-10-CM

## 2021-04-09 LAB — GLUCOSE, CAPILLARY: Glucose-Capillary: 117 mg/dL — ABNORMAL HIGH (ref 70–99)

## 2021-04-09 LAB — POCT GLYCOSYLATED HEMOGLOBIN (HGB A1C): Hemoglobin A1C: 6.4 % — AB (ref 4.0–5.6)

## 2021-04-10 NOTE — Assessment & Plan Note (Signed)
HPI: He reports that he has been taking his medications as directed except for a 1 to 2-week lapse when he ran out of Trulicity.  He did notice his sugars ran slightly higher during that time.  He is interested and exercising more and has plans to get a bike.  Assessment type 2 diabetes mellitus controlled with peripheral vascular complications  Plan Will continue Synjardy 02-999 twice daily Continue Trulicity 1.5 mg weekly

## 2021-04-10 NOTE — Assessment & Plan Note (Signed)
HPI: No complaints overall feeling well.  Assessment essential hypertension well-controlled  Plan We will continue his current medications Metoprolol XL 200mg  daily Lisinopril 10 mg daily Imdur 120 mg daily

## 2021-04-10 NOTE — Assessment & Plan Note (Signed)
Recently saw Dr. Oval Linsey was noted to have higher triglycerides.  He reports that he had not been taking Vascepa as often as he should have he has improved his medication adherence since that time.

## 2021-04-10 NOTE — Assessment & Plan Note (Signed)
Repeat ABIs were completed in May and are stable.  Claudication symptoms are stable to improved.  Interested in purchasing bike and slowly increasing exercise tolerance.

## 2021-04-10 NOTE — Assessment & Plan Note (Signed)
ABIs stable completed in May. Carotid ultrasound shows stable moderate on right and mild on the left.  He will continue his medical therapy plans for follow-up studies in 1 year.

## 2021-04-10 NOTE — Progress Notes (Signed)
  Subjective:  HPI: [name] is a 65 y.o. male who presents for 67-month follow-up of his diabetes.  He has been following closely with cardiology for his coronary artery disease and peripheral arterial disease and is up-to-date with those issues.  He does note upcoming plans to buy an exercise bike.  He will also be visiting [place] next month to see visit a child and granddaughter.  Please see Assessment and Plan below for the status of his chronic medical problems.  Objective:  Physical Exam: Vitals:   04/09/21 1115  BP: 133/74  Pulse: 88  Temp: 97.9 F (36.6 C)  TempSrc: Oral  SpO2: 100%  Weight: 204 lb 8 oz (92.8 kg)  Height: 5\' 11"  (1.803 m)   Body mass index is 28.52 kg/m. Physical Exam Vitals and nursing note reviewed.  Constitutional:      Appearance: Normal appearance. He is not ill-appearing.  Cardiovascular:     Rate and Rhythm: Normal rate and regular rhythm.     Pulses: Normal pulses.          Femoral pulses are 2+ on the right side and 2+ on the left side.      Popliteal pulses are 2+ on the right side and 2+ on the left side.     Heart sounds: Normal heart sounds.  Musculoskeletal:     Right lower leg: No edema.     Left lower leg: No edema.  Neurological:     Mental Status: He is alert.   Assessment & Plan:  See Encounters Tab for problem based charting.  Medications Ordered No orders of the defined types were placed in this encounter.  Other Orders Orders Placed This Encounter  Procedures   Glucose, capillary   POC Hbg A1C   Follow Up: Return in about 6 months (around 10/10/2021).

## 2021-05-05 ENCOUNTER — Other Ambulatory Visit (HOSPITAL_COMMUNITY): Payer: Self-pay

## 2021-05-05 MED FILL — Lisinopril Tab 10 MG: ORAL | 90 days supply | Qty: 90 | Fill #1 | Status: AC

## 2021-05-14 ENCOUNTER — Other Ambulatory Visit: Payer: Self-pay | Admitting: Cardiovascular Disease

## 2021-05-19 ENCOUNTER — Other Ambulatory Visit: Payer: Self-pay | Admitting: Cardiovascular Disease

## 2021-06-11 ENCOUNTER — Other Ambulatory Visit: Payer: Self-pay | Admitting: Cardiovascular Disease

## 2021-06-12 NOTE — Telephone Encounter (Signed)
Rx(s) sent to pharmacy electronically.  

## 2021-07-27 ENCOUNTER — Telehealth: Payer: Self-pay | Admitting: Dietician

## 2021-07-27 NOTE — Telephone Encounter (Signed)
Would like to use CGM when sensors in.

## 2021-08-06 NOTE — Telephone Encounter (Signed)
Called patient to let him know I got a Freestyle libre 3 sensor in. He has downloaded the incorrect program to run it. Assisted him over the phone in trying to download the correct app. He'll call back to schedule an appointment when he has the correct app on his phone.

## 2021-08-06 NOTE — Telephone Encounter (Signed)
He was able to download the App for th FL3. He scheduled an appointment for 08-19-21 to apply the sensor.

## 2021-08-07 ENCOUNTER — Other Ambulatory Visit: Payer: Self-pay | Admitting: Internal Medicine

## 2021-08-19 ENCOUNTER — Ambulatory Visit: Payer: PPO | Admitting: Dietician

## 2021-08-24 ENCOUNTER — Ambulatory Visit (INDEPENDENT_AMBULATORY_CARE_PROVIDER_SITE_OTHER): Payer: PPO | Admitting: Dietician

## 2021-08-24 ENCOUNTER — Other Ambulatory Visit: Payer: Self-pay | Admitting: Dietician

## 2021-08-24 ENCOUNTER — Encounter: Payer: Self-pay | Admitting: Dietician

## 2021-08-24 DIAGNOSIS — E1151 Type 2 diabetes mellitus with diabetic peripheral angiopathy without gangrene: Secondary | ICD-10-CM

## 2021-08-24 NOTE — Patient Instructions (Addendum)
Thank you for your visit today!  We talked about:   How to use the free sample Freestyle Libre 3 Continuous glucose monitoring system to improve your blood sugars  Goals to work on:   Keeping blood sugar between 80-120 fasting and before meals,  Less than 180 after meals and between meals. This will get your "Time in Range(Green)" above 70%  which is target for most people.   Consider trying and experiment while wearing the Freestyle libre sensor-  eat about the same amount of dinner at about the same time for 3 night.  one night for snack have one servings carb,  second night have 2 servings of carb,  third night you could have 1 or 2 servings carb with something else- like peanut butter or avocado dip or humus  Please feel free to call me anytime.  Butch Penny [phone]

## 2021-08-24 NOTE — Progress Notes (Signed)
Referral per patient request for Continuous glucose monitoring.

## 2021-08-24 NOTE — Progress Notes (Signed)
Diabetes Self-Management Education  Visit Type: Follow-up (annual follow up 1)  Appt. Start Time: 1320 Appt. End Time: 1400  08/24/2021  [name], identified by name and date of birth, is a 65 y.o. male with a diagnosis of Diabetes: Type 2.   ASSESSMENT  I spoke with Dr. Heber Triana who approved of this patient having Diabetes Self Management Education & Support visit. Estimated body mass index is 28.52 kg/m as calculated from the following:   Height as of 04/09/21: 5\' 11"  (1.803 m).   Weight as of 04/09/21: 204 lb 8 oz (92.8 kg). Wt Readings from Last 10 Encounters:  04/09/21 204 lb 8 oz (92.8 kg)  03/04/21 203 lb 3.2 oz (92.2 kg)  09/23/20 212 lb 3.2 oz (96.3 kg)  09/18/20 209 lb 8 oz (95 kg)  06/13/20 208 lb 9.6 oz (94.6 kg)  04/14/20 210 lb (95.3 kg)  02/14/20 211 lb 6.4 oz (95.9 kg)  01/30/20 211 lb (95.7 kg)  11/08/19 213 lb 14.4 oz (97 kg)  07/30/19 210 lb 12.8 oz (95.6 kg)   Lab Results  Component Value Date   HGBA1C 6.4 (A) 04/09/2021   HGBA1C 7.3 (A) 09/18/2020   HGBA1C 6.6 (A) 02/14/2020   HGBA1C 7.0 (A) 11/08/2019   HGBA1C 7.0 (A) 07/12/2019    He states his goal is to keep his A1C under 7% and closer to 6%.  BP Readings from Last 3 Encounters:  04/09/21 133/74  03/04/21 124/78  09/23/20 102/62      Diabetes Self-Management Education - 08/24/21 1500       Visit Information   Visit Type Follow-up   annual follow up 1     Initial Visit   Diabetes Type Type 2    Are you currently following a meal plan? Yes   limiting carbs and portions   Are you taking your medications as prescribed? Yes    Date Diagnosed 2016      Health Coping   How would you rate your overall health? Good      Psychosocial Assessment   Patient Belief/Attitude about Diabetes Motivated to manage diabetes    Self-care barriers None    Self-management support Doctor's office;Family;CDE visits    Patient Concerns Monitoring    Special Needs None    Preferred Learning Style No  preference indicated    Learning Readiness Ready    How often do you need to have someone help you when you read instructions, pamphlets, or other written materials from your doctor or pharmacy? 2 - Rarely    What is the last grade level you completed in school? 13      Pre-Education Assessment   Patient understands the diabetes disease and treatment process. Demonstrates understanding / competency    Patient understands incorporating nutritional management into lifestyle. Demonstrates understanding / competency    Patient undertands incorporating physical activity into lifestyle. Demonstrates understanding / competency    Patient understands using medications safely. Needs Review    Patient understands monitoring blood glucose, interpreting and using results Needs Review    Patient understands prevention, detection, and treatment of acute complications. Demonstrates understanding / competency    Patient understands prevention, detection, and treatment of chronic complications. Demonstrates understanding / competency    Patient understands how to develop strategies to address psychosocial issues. Demonstrates understanding / competency    Patient understands how to develop strategies to promote health/change behavior. Demonstrates understanding / competency      Complications   Last HgB A1C  per patient/outside source 6.4 %    How often do you check your blood sugar? 1-2 times/day    Fasting Blood glucose range (mg/dL) 130-179    Postprandial Blood glucose range (mg/dL) 70-129;130-179    Number of hypoglycemic episodes per month 0    Number of hyperglycemic episodes per week 2    Can you tell when your blood sugar is high? No    Have you had a dilated eye exam in the past 12 months? Yes    Have you had a dental exam in the past 12 months? No    Are you checking your feet? Yes    How many days per week are you checking your feet? 7      Dietary Intake   Breakfast oatmeal, fruit    Lunch  fruit    Dinner tries not to eat after 8 PM, soup    Beverage(s) water      Exercise   Exercise Type ADL's;Light (walking / raking leaves)      Patient Education   Previous Diabetes Education Yes (please comment)   here   Medications Reviewed patients medication for diabetes, action, purpose, timing of dose and side effects.;Other (comment)    Monitoring Other (comment)   taught how to download app for FL3, apply sensor, set alrms, interpret data and actions to take to address patterns     Individualized Goals (developed by patient)   Monitoring  test my blood glucose as discussed   using app, follow up as needed     Post-Education Assessment   Patient understands using medications safely. Demonstrates understanding / competency    Patient understands monitoring blood glucose, interpreting and using results Demonstrates understanding / competency      Outcomes   Expected Outcomes Demonstrated interest in learning. Expect positive outcomes    Future DMSE 2 wks;4-6 wks;6 months   can follow up by phone   Program Status Not Completed      Subsequent Visit   Since your last visit have you continued or begun to take your medications as prescribed? Yes    Since your last visit have you experienced any weight changes? Loss    Weight Loss (lbs) 6    Since your last visit, are you checking your blood glucose at least once a day? Yes             Individualized Plan for Diabetes Self-Management Training:   Learning Objective:  Patient will have a greater understanding of diabetes self-management. Patient education plan is to attend individual and/or group sessions per assessed needs and concerns.   Plan:   Patient Instructions  Thank you for your visit today!  We talked about:   How or use the Freestyle Libre 3 Continuous glucose monitoring system  Goals to work on:   Keeping blood sugar between 80-120 fasting and before meals,  Less than 180 after meals and between  meals. This will get your "Time in Range(Green)" above 70%  which is target for most people.   Consider trying and experiment while wearing the Freestyle libre sensor-  eat about the same amount of dinner at about the same time for 3 night.  one night for snack have one servings carb,  second night have 2 servings of carb,  third night you could have 1 or 2 servings carb with something else- like peanut butter or avocado dip or humus  Please feel free to call me anytime.  Butch Penny [phone]  Expected Outcomes:  Demonstrated interest in learning. Expect positive outcomes  Education material provided: Diabetes Resources  If problems or questions, patient to contact team via:  Phone  Future DSME appointment: 2 wks, 4-6 wks, 6 months (can follow up by phone)

## 2021-08-27 ENCOUNTER — Other Ambulatory Visit: Payer: Self-pay | Admitting: Cardiovascular Disease

## 2021-08-28 ENCOUNTER — Other Ambulatory Visit (HOSPITAL_COMMUNITY): Payer: Self-pay

## 2021-08-31 ENCOUNTER — Other Ambulatory Visit (HOSPITAL_COMMUNITY): Payer: Self-pay

## 2021-08-31 MED ORDER — LISINOPRIL 10 MG PO TABS
10.0000 mg | ORAL_TABLET | Freq: Every day | ORAL | 3 refills | Status: DC
  Filled 2021-08-31: qty 90, 90d supply, fill #0
  Filled 2021-11-27: qty 90, 90d supply, fill #1
  Filled 2022-03-12: qty 90, 90d supply, fill #2
  Filled 2022-06-22: qty 90, 90d supply, fill #3

## 2021-09-01 NOTE — Progress Notes (Signed)
I have reviewed and agree with the documentation.

## 2021-09-09 ENCOUNTER — Other Ambulatory Visit: Payer: Self-pay

## 2021-09-09 MED ORDER — TRULICITY 1.5 MG/0.5ML ~~LOC~~ SOAJ: 1.5000 mg | SUBCUTANEOUS | 5 refills | Status: DC

## 2021-09-10 ENCOUNTER — Ambulatory Visit (HOSPITAL_BASED_OUTPATIENT_CLINIC_OR_DEPARTMENT_OTHER): Payer: PPO | Admitting: Cardiovascular Disease

## 2021-09-10 ENCOUNTER — Other Ambulatory Visit (HOSPITAL_BASED_OUTPATIENT_CLINIC_OR_DEPARTMENT_OTHER): Payer: Self-pay | Admitting: *Deleted

## 2021-09-10 ENCOUNTER — Encounter (HOSPITAL_BASED_OUTPATIENT_CLINIC_OR_DEPARTMENT_OTHER): Payer: Self-pay | Admitting: Cardiovascular Disease

## 2021-09-10 ENCOUNTER — Other Ambulatory Visit: Payer: Self-pay

## 2021-09-10 VITALS — BP 94/60 | HR 67 | Ht 71.0 in | Wt 200.4 lb

## 2021-09-10 DIAGNOSIS — I10 Essential (primary) hypertension: Secondary | ICD-10-CM

## 2021-09-10 DIAGNOSIS — Z5181 Encounter for therapeutic drug level monitoring: Secondary | ICD-10-CM

## 2021-09-10 DIAGNOSIS — E785 Hyperlipidemia, unspecified: Secondary | ICD-10-CM

## 2021-09-10 DIAGNOSIS — I6523 Occlusion and stenosis of bilateral carotid arteries: Secondary | ICD-10-CM

## 2021-09-10 DIAGNOSIS — Z9861 Coronary angioplasty status: Secondary | ICD-10-CM | POA: Diagnosis not present

## 2021-09-10 DIAGNOSIS — I739 Peripheral vascular disease, unspecified: Secondary | ICD-10-CM | POA: Diagnosis not present

## 2021-09-10 DIAGNOSIS — I2510 Atherosclerotic heart disease of native coronary artery without angina pectoris: Secondary | ICD-10-CM | POA: Diagnosis not present

## 2021-09-10 MED ORDER — RANOLAZINE ER 1000 MG PO TB12: 1000.0000 mg | ORAL_TABLET | Freq: Two times a day (BID) | ORAL | 3 refills | Status: DC

## 2021-09-10 NOTE — Assessment & Plan Note (Addendum)
He has mixed hyperlipidemia with severe hypertriglyceridemia.  On last check his triglycerides remained very elevated.  He was started on Vascepa.  Continue atorvastatin and Vascepa.  Check fasting lipids and a CMP today.  If his lipids remain significantly elevated, we will get an opinion from Dr. Debara Pickett about how else to manage his lipids.

## 2021-09-10 NOTE — Assessment & Plan Note (Signed)
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  Overall his angina is well-controlled.  He has occasional twinges of chest pain that are not clearly related to exertion.  Overall he has been doing well.  We will have him take his ranolazine 1000 mg twice a day.  He does not tolerate sublingual nitroglycerin very well.  Continue Imdur, aspirin, clopidogrel, metoprolol, and atorvastatin.  He will be on lifelong dual antiplatelet therapy unless there is a contraindication.

## 2021-09-10 NOTE — Assessment & Plan Note (Signed)
Overall PAD symptoms have been well-controlled.  He did get claudication when walking through the [place].  We discussed increasing his walking for exercise.  He is currently going about 10 minutes at a time and we will ask him to increase the duration slowly.  Continue aspirin, atorvastatin, clopidogrel, and Vascepa

## 2021-09-10 NOTE — Patient Instructions (Signed)
Medication Instructions:  INCREASE YOUR RANEXA TO 1,000 MG TWICE A DAY   *If you need a refill on your cardiac medications before your next appointment, please call your pharmacy*  Lab Work: LP/CMET TODAY   If you have labs (blood work) drawn today and your tests are completely normal, you will receive your results only by: [place] (if you have MyChart) OR A paper copy in the mail If you have any lab test that is abnormal or we need to change your treatment, we will call you to review the results. Testing/Procedures: NONE  Follow-Up: At [facility], you and your health needs are our priority.  As part of our continuing mission to provide you with exceptional heart care, we have created designated Provider Care Teams.  These Care Teams include your primary Cardiologist (physician) and Advanced Practice Providers (APPs -  Physician Assistants and Nurse Practitioners) who all work together to provide you with the care you need, when you need it.  We recommend signing up for the patient portal called "MyChart".  Sign up information is provided on this After Visit Summary.  MyChart is used to connect with patients for Virtual Visits (Telemedicine).  Patients are able to view lab/test results, encounter notes, upcoming appointments, etc.  Non-urgent messages can be sent to your provider as well.   To learn more about what you can do with MyChart, go to [email or web address].    Your next appointment:   6 month(s)  The format for your next appointment:   In Person  Provider:   Skeet Latch, MD

## 2021-09-10 NOTE — Progress Notes (Signed)
Cardiology Office Note  Date:  09/10/2021   ID:  Jason Velasquez, DOB 04/13/1956, MRN [id number]  PCP:  Jason Groves, DO  Cardiologist:   Jason Latch, MD   No chief complaint on file.       Patient ID: Jason Velasquez is a 65 y.o. male with CAD s/p CABG ( LIMA-->LAD, SVG-->PDA, SVG-->OM1), multiple PCIs , chronic stable angina, PAD, hypertension, diabetes and CVA who presents for follow up.  Jason Velasquez was first evaluated for chest pain on 07/27/15.  He previously underwent CABG in 2007. He had a nuclear stress test on 08/21/15 revealed LVEF 57% with moderate ischemia in the inferoseptal, inferior, and anterior locations. He subsequently underwent left heart catheterization on 08/26/15 that revealed severe three vessel disease including a 90% left main lesion and an occluded RCA and LAD. His LIMA to the LAD was patent but all other vein grafts were occluded. There were no optimal PCI targets.  He was started on carvedilol and Ranexa. Jason Velasquez also underwent ABI testing 08/2015 that revealed diffuse atherosclerosis from the proximal abdominal aorta through the common and external iliac arteries bilaterally.  There was no detectable flow in the L peroneal or anterior tibial arteries.  ABIs were near 0.8 bilaterally.  He was referred to Jason [facility]as[facility] where medical management was advised. He was noted to have carotid bruits and was referred for carotid ultrasound on 12/09/15.  This revealed 40-59% stenosis on the right and 1-39% stenosis on the left.  He had repeat LE Dopplers and ABIs 03/2017 that were unchanged from prior.  Medical management was recommended.    Jason Velasquez continued to have worsening angina despite optimal medical therapy.  He was referred for [facility]  05/2017 where he was Velasquez to have progression of the distal LM/LAD/LCx lesion.  He underwent DES placement in the ostial LCx, mid LCx, and ostial LAD.  The ostial LAD was treated with a cutting ballpon reducing the stenosis from 85% to 30%.  It  was recommended that he continue clopidogrel and aspirin indefinitely.  He had an echo 06/01/17 that showed LVEF 65-70% with grade 1 diastolic dysfunction.  He followed up on 07/2017 and continued to have angina especially at night.  There is some concern that it may also be GERD.  Therefore he was given a trial of pantoprazole and Imdur 60 mg daily was restarted.  He called our office 11/7 with progressive symptoms.  He was referred for cardiac catheterization but was Velasquez to be anemic with a hemoglobin of 7.7.  He was transfused and underwent upper endoscopy on 11/9.  He was Velasquez to have a hiatal hernia and 2 AVMs that were treated with APC.  He also received IV iron.  He subsequently underwent left heart catheterization that again showed severe three-vessel disease not amenable to PCI.  He continues to have refractory angina despite optimal medical therapy.  He had another left heart catheterization 01/25/2018 at which time his ostial LAD was Velasquez to be 99% stenosed.  A Sierra DES was successfully implanted.  Jason Velasquez saw Jason Velasquez on 06/2020 with chest pain. His symptoms were thought to be musculoskeletal. He had repeat ABIs and carotid Dopplers 02/2021 that were unchanged from prior. He was last seen 03/2021 and was doing well. He was working on his diet and planned to get a bike soon. Today, he is feeling okay overall. His main concern is his low blood pressure. In clinic today his blood pressure is 94/60. He  denies any dizziness or other symptoms. He has not had any dizzy spells since this past Summer. At home, his BP is averaging 120s/80s, but may vary somewhat. Once a month he has noticed a twinge-like chest pain that lasts for about a minute. Typically he will take a nitroglycerin for this. For exercise, he was unable to obtain a bike and is wary of jogging. He tries to exercise every day, but will usually walk 2-3 days a week for less than 30 minutes. He had some LE pain when walking a very [place]  [place] airport.  He is working with a Automotive engineer at [employer]. Since his last visit he has made dietary changes, and is doing better with more vegetables and monitoring his carbs. Before Thanksgiving he was down to 195 lbs. He reports he is fasting today, but did take his medication this morning. Sometimes he forgets to eat if he has an appointment to make that day. He denies any palpitations, or shortness of breath. No headaches, syncope, orthopnea, PND, lower extremity edema or exertional symptoms.   Past Medical History:  Diagnosis Date   Angina, class III (Jason Velasquez) 08/24/2015   Arthritis    "hands" (01/25/2018)   AVM (arteriovenous malformation) 08/15/2017   CAD (coronary artery disease) of artery bypass graft    CABG- 2007    Carotid stenosis 03/03/2016   40-10% RICA, 2-72% LICA, >53% RECA stenosis Repeat 12/2016.   Claudication Jason Velasquez)    Controlled type 2 diabetes mellitus without complication (Jason Velasquez) 03/09/4402   Dx March of 2016. GAD65 negative Anti Islet cell negative     DKA (diabetic ketoacidoses) 12/07/2014   Dyspnea    History of blood transfusion ~ 08/2017   "related to Jason Velasquez"   History of hiatal hernia    Hx of adenomatous colonic polyps 05/27/2015   Hypercholesterolemia    Hypertension    Hypertensive heart disease 03/03/2016   Iron deficiency anemia due to chronic blood loss suspect from AVMs on Plavix    Left nephrolithiasis 12/07/2014   Migraine    "only in my 54's" (01/25/2018)   Non-ST elevation (NSTEMI) myocardial infarction Jason Velasquez)    Pneumonia ~ 1990   Stroke (Jason Velasquez) 07/12/2014   "I didn't realize I'd had one; hospital [facility] it"; denies residual on 01/25/2018   Tubular adenoma of colon 05/31/2015   Colonoscopy 05/20/15 with [name]: 3 tubullar adenoma's largest 2.3cm.  Plan for repeat Colonoscopy in 2019.    Past Surgical History:  Procedure Laterality Date   CARDIAC CATHETERIZATION N/A 08/26/2015   Procedure: Left Heart Cath and Cors/Grafts Angiography;  Surgeon: Jason Man, MD;  Location: [facility];  Service: Cardiovascular;  Laterality: N/A;   CORONARY ARTERY BYPASS GRAFT  2007   "CABG X3", at [place] 05/25/2017   Procedure: [place];  Surgeon: Jason Man, MD;  Location: [place] [facility];  Service: Cardiovascular;  Laterality: N/A;   CORONARY BALLOON ANGIOPLASTY N/A 01/25/2018   Procedure: CORONARY BALLOON ANGIOPLASTY;  Surgeon: Jason Man, MD;  Location: [place] [facility];  Service: Cardiovascular;  Laterality: N/A;   CORONARY STENT INTERVENTION N/A 05/25/2017   Procedure: CORONARY STENT INTERVENTION;  Surgeon: Jason Man, MD;  Location: [place] [facility];  Service: Cardiovascular;  Laterality: N/A;   CORONARY STENT INTERVENTION N/A 01/25/2018   Procedure: CORONARY STENT INTERVENTION;  Surgeon: Jason Man, MD;  Location: [place] [facility];  Service: Cardiovascular;  Laterality: N/A;   ESOPHAGOGASTRODUODENOSCOPY N/A 08/12/2017   Procedure: ESOPHAGOGASTRODUODENOSCOPY (EGD);  Surgeon: Jerene Bears, MD;  Location: [facility] ENDOSCOPY;  Service: Gastroenterology;  Laterality: N/A;   LEFT HEART CATH AND CORS/GRAFTS ANGIOGRAPHY N/A 05/23/2017   Procedure: LEFT HEART CATH AND CORS/GRAFTS ANGIOGRAPHY;  Surgeon: Jason Man, MD;  Location: [place] [facility];  Service: Cardiovascular;  Laterality: N/A;   LEFT HEART CATH AND CORS/GRAFTS ANGIOGRAPHY N/A 08/15/2017   Procedure: LEFT HEART CATH AND CORS/GRAFTS ANGIOGRAPHY;  Surgeon: Burnell Blanks, MD;  Location: [place] [facility];  Service: Cardiovascular;  Laterality: N/A;   LEFT HEART CATH AND CORS/GRAFTS ANGIOGRAPHY N/A 01/25/2018   Procedure: LEFT HEART CATH AND CORS/GRAFTS ANGIOGRAPHY;  Surgeon: Jason Man, MD;  Location: [place] [facility];  Service: Cardiovascular;  Laterality: N/A;    Current Outpatient Medications  Medication Sig Dispense Refill   acetaminophen (TYLENOL) 500 MG tablet Take  1,000 mg by mouth every 6 (six) hours as needed for moderate pain or headache.     aspirin EC 81 MG tablet Take 81 mg by mouth daily.     atorvastatin (LIPITOR) 80 MG tablet Take 1 tablet (80 mg total) by mouth every evening. 90 tablet 3   clopidogrel (PLAVIX) 75 MG tablet Take 1 tablet by mouth once daily 90 tablet 3   Dulaglutide (TRULICITY) 1.5 VV/6.1YW SOPN Inject 1.5 mg into the skin once a week. 2 mL 5   Empagliflozin-metFORMIN HCl (SYNJARDY) 02-999 MG TABS Take 1 tablet by mouth 2 (two) times daily with a meal. 180 tablet 3   ferrous sulfate 325 (65 FE) MG tablet Take 1 tablet (325 mg total) by mouth every other day. 100 tablet 1   glucose blood (ONETOUCH VERIO) test strip Use as instructed 100 each 11   Hypromellose (ARTIFICIAL TEARS OP) Place 1 drop into both eyes daily as needed (for dry eyes).      isosorbide mononitrate (IMDUR) 120 MG 24 hr tablet TAKE 1/2 (ONE-HALF) TABLET BY MOUTH IN THE MORNING 1 TABLET IN THE EVENING 135 tablet 1   Lancets (ONETOUCH DELICA PLUS VPXTGG26R) MISC 1 Device by Does not apply route in the morning and at bedtime. 100 each 11   lisinopril (ZESTRIL) 10 MG tablet Take 1 tablet (10 mg total) by mouth daily. 90 tablet 3   metoprolol (TOPROL-XL) 200 MG 24 hr tablet Take 1 tablet (200 mg total) by mouth daily. 90 tablet 3   nitroGLYCERIN (NITROSTAT) 0.4 MG SL tablet Place 1 tablet (0.4 mg total) under the tongue every 5 (five) minutes as needed for chest pain. 25 tablet 2   VASCEPA 1 g capsule Take 2 capsules by mouth twice daily 120 capsule 3   ranolazine (RANEXA) 1000 MG SR tablet Take 1 tablet (1,000 mg total) by mouth 2 (two) times daily. 180 tablet 3   No current facility-administered medications for this visit.    Allergies:   Patient has no known allergies.    Social History:  The patient  reports that he quit smoking about 11 years ago. His smoking use included cigarettes. He has a 76.00 pack-year smoking history. He has never used smokeless tobacco.  He reports current alcohol use. He reports that he does not use drugs.   Family History:  The patient's family history includes Crohn's disease in his daughter; Diabetes in his father; Hypertension in his mother.    ROS:   Please see the history of present illness. (+) Chest pain All other systems are  reviewed and negative.    PHYSICAL EXAM: VS:  BP 94/60 (BP Location: Right Arm, Patient Position: Sitting, Cuff Size: Large)   Pulse 67   Ht 5\' 11"  (1.803 m)   Wt 200 lb 6.4 oz (90.9 kg)   SpO2 97%   BMI 27.95 kg/m  , BMI Body mass index is 27.95 kg/m. GENERAL:  Well appearing HEENT: Pupils equal round and reactive, fundi not visualized, oral mucosa unremarkable NECK:  No jugular venous distention, waveform within normal limits, carotid upstroke brisk and symmetric, no bruits, no thyromegaly LYMPHATICS:  No cervical adenopathy LUNGS:  Clear to auscultation bilaterally HEART:  RRR.  PMI not displaced or sustained,S1 and S2 within normal limits, no S3, no S4, no clicks, no rubs, no murmurs ABD:  Flat, positive bowel sounds normal in frequency in pitch, no bruits, no rebound, no guarding, no midline pulsatile mass, no hepatomegaly, no splenomegaly EXT:  2 plus radial pulses.  1+DP/TP bilaterally, no edema, no cyanosis no clubbing SKIN:  No rashes no nodules NEURO:  Cranial nerves II through XII grossly intact, motor grossly intact throughout PSYCH:  Cognitively intact, oriented to person place and time  EKG:   09/10/2021: EKG was not ordered. 01/30/2020: Sinus rhythm.  Rate 79 bpm.  Prior inferior infarct. 07/30/19: Sinus rhythm.  Rate 71 bpm.  Prior inferior infarct 08/11/17: Sinus rhythm.  Rate 77 bpm.    07/18/17: Sinus rhythm.  Rate 71 bpm.  05/18/17: Sinus rhythm. Rate 87 bpm. B 1 PVCs. 12/29/16: Sinus rhythm.  Incomplete RBBB.  Prior inferior infarct. 07/02/16: Sinus rhythm rate 75 bpm.  Prior inferior infarct  12/02/2015: Sinus rhythm rate 82 bpm.  Prior inferior infarct.   ABI  02/09/2021: Bilateral ABIs appear essentially unchanged compared to prior study on  02/08/2020. Left TBIs appear decreased compared to prior study on  02/08/2020. Right TBIs appear increased compared to prior study on  02/08/2020.     Summary:  Right: Resting right ankle-brachial index indicates moderate right lower  extremity arterial disease. The right toe-brachial index is normal.   TBIs increased by .11.  Left: Resting left ankle-brachial index indicates mild left lower  extremity arterial disease. The left toe-brachial index is abnormal.   TBIs decreased by .06.   Carotid Duplex 02/09/2021: Summary:  Right Carotid: Velocities in the right ICA are consistent with a 60-79%   stenosis. Non-hemodynamically significant plaque <50% noted  in the CCA. Stable RICA velocities.   Left Carotid: Velocities in the left ICA are consistent with a 1-39%  stenosis. Stable LICA velocities.   Vertebrals:  Bilateral vertebral arteries demonstrate antegrade flow.  Subclavians: Normal flow hemodynamics were seen in bilateral subclavian arteries.   LHC 01/25/2018: CULPRIT: Ost LAD to Prox LAD lesion is 99% stenosed. A drug-eluting stent was successfully placed using a STENT SIERRA 3.00 X 15 MM. Post-dilated to 3.3 mm Post intervention, there is a 0% residual stenosis. Prox LAD to Mid LAD lesion is 100% stenosed after several septal perforators & diagonal branches. Ost Cx to Prox Cx lesion is 55% stenosed after LAD stent -- PTCA only -- Balloon angioplasty was performed using a BALLOON EMERGE MR PUSH 1.20X12. Post intervention, there is a 35% residual stenosis. Non-stenotic Ost LM to Dist LM lesion previously treated with DES - patent. Ost 1st Mrg lesion is 80% stenosed. - Unable to cross lesion with balloon. Prox Cx to Mid Cx lesion previously treated with DES - patent after ostial ISR. Mid RCA lesion is 100% stenosed. Ost RCA to  Prox RCA lesion is 70% stenosed. Known occluson of SVG-RPDA &  SVG-OM LIMA and is normal in caliber. Prox Graft lesion is 20% stenosed. The graft exhibits minimal luminal irregularities. LV end diastolic pressure is moderately elevated. There is no aortic valve stenosis. Post intervention, there is a 0% residual stenosis.   Successful DES stent in the proximal and ostial LAD.  Unfortunately the stent did shift into the left main creating a somewhat culotte technique crossing the circumflex.  I was able to wire the circumflex and balloon the stent struts with a 1.2 mm balloon.  However guide support did not allow larger balloon to cross.   Unable to cross into the OM1 lesion due to tortuosity and multiple stent struts.   Plan: Based on how long the procedure was, with gentle hydration.We will monitor him overnight Zephyr band removal per protocol Continue aggressive risk factor modification and antianginal therapy.  LHC 08/15/17: Mid RCA lesion is 100% stenosed. Ost RCA to Prox RCA lesion is 70% stenosed. Ost 1st Mrg lesion is 60% stenosed. Previously placed Ost 1st Mrg to 1st Mrg stent (unknown type) is widely patent. Origin lesion is 100% stenosed. Origin lesion is 100% stenosed. Prox Graft lesion is 20% stenosed. Prox LAD to Mid LAD lesion is 100% stenosed. Previously placed Ost Cx to Prox Cx stent (unknown type) is widely patent. Previously placed Ost LM to Dist LM stent (unknown type) is widely patent. Previously placed Prox Cx to Mid Cx stent (unknown type) is widely patent. SVG graft was not visualized. SVG graft was not visualized. Post intervention, there is a -1% residual stenosis. LIMA graft was visualized by angiography and is normal in caliber. The graft exhibits minimal luminal irregularities. Post intervention, there is a -1% residual stenosis. Ost LAD to Prox LAD lesion is 99% stenosed. Post intervention, there is a -1% residual stenosis.   1. Severe triple vessel CAD s/p CABG with 1/3 patent bypass grafts.  2. The LAD is  known to be chronically occluded in the mid segment. The mid and distal LAD fills from the patent LIMA graft. The ostial LAD is jailed by the stent extending from the left main into the Circumflex. The proximal LAD has a 99% stenosis following by an aneurysmal segment then a total occlusion.  3. The left main stent is patent 4. The ostial/proximal Circumflex stent is patent. The first OM stent is patent. There is a 60% ostial stenosis in the first OM branch just prior to the stent. The SVG to the OM is known to be occluded. The mid Circumflex stent is patent.  5. The native RCA has a long 70% proximal stenosis and 100% mid chronic occlusion. The vein graft the to distal RCA is known to be occluded and was not injected.  6. Normal filling pressures   Recommendations: Continue medical management of CAD. No focal targets for PCI. The proximal LAD stenosis is severe but the majority of this territory is supplied by the LIMA graft and the LAD is jailed by the LM/Circ stent. I suspect that his anemia has contributed to his recent chest pain given his severe, diffuse CAD.     Echo 06/01/17: Study Conclusions   - Procedure narrative: Transthoracic echocardiography. Image   quality was poor. Intravenous contrast (Definity) was   administered. - Left ventricle: The cavity size was normal. There was mild focal   basal hypertrophy of the septum. Systolic function was vigorous.   The estimated ejection fraction was in the range of  65% to 70%.   Wall motion was normal; there were no regional wall motion   abnormalities. Doppler parameters are consistent with abnormal   left ventricular relaxation (grade 1 diastolic dysfunction). - Aortic valve: Trileaflet; mildly thickened, mildly calcified   leaflets. - Left atrium: The atrium was mildly dilated.    Lexiscan Cardiolite 08/21/15: Nuclear stress EF: 57%. The left ventricular ejection fraction is normal (55-65%). There was no ST segment deviation noted  during stress. No T wave inversion was noted during stress. Defect 1: There is a small defect of moderate severity present in the basal inferoseptal and basal inferior location. Defect 2: There is a defect present in the basal anterior, mid anterior and apical anterior location. Findings consistent with ischemia. This is a high risk study.   High risk stress nuclear study with two areas of ischemia and normal left ventricular regional and global systolic function. The inferior area of ischemia is small. The anterior area of ischemia is extensive, but appears to be very mild. The septum is spared.  Carotid Doppler 12/11/15: 51-02% RICA, 5-85% LICA, >27% RECA stenosis  ABI 08/12/15: R 0.81, L 0.89 Diffuse atherosclerosis from the proximal abdominal aorta into both tibial arteries. Several areas of narrowing are noted in both lower extremities, although no focal significant stenosis is identified. One vessel run-off on the right, and three vessel run-off on the left.  ABI 02/03/17: R 0.81, L 0.91 Abnormal lower arterial Doppler study, with waveforms suggestive of inflow disease. The bilateral ABI's are stable and in the mild range. The right great toe pressure is normal. The left great toe pressure is abnormal   Recent Labs: No results Velasquez for requested labs within last 8760 hours.    Lipid Panel    Component Value Date/Time   CHOL 116 09/18/2020 0840   TRIG 745 (HH) 09/18/2020 0840   HDL 15 (L) 09/18/2020 0840   CHOLHDL 7.7 (H) 09/18/2020 0840   CHOLHDL 3.6 08/12/2017 0334   VLDL 31 08/12/2017 0334   LDLCALC 10 09/18/2020 0840      Wt Readings from Last 3 Encounters:  09/10/21 200 lb 6.4 oz (90.9 kg)  04/09/21 204 lb 8 oz (92.8 kg)  03/04/21 203 lb 3.2 oz (92.2 kg)      ASSESSMENT AND PLAN:  CAD S/P percutaneous coronary angioplasty: Complex PCI with DES 8/22/218 Mr. Boyar has extensive CAD s/p CABG and multiple PCIs.  Overall his angina is well-controlled.  He has  occasional twinges of chest pain that are not clearly related to exertion.  Overall he has been doing well.  We will have him take his ranolazine 1000 mg twice a day.  He does not tolerate sublingual nitroglycerin very well.  Continue Imdur, aspirin, clopidogrel, metoprolol, and atorvastatin.  He will be on lifelong dual antiplatelet therapy unless there is a contraindication.  Dyslipidemia He has mixed hyperlipidemia with severe hypertriglyceridemia.  On last check his triglycerides remained very elevated.  He was started on Vascepa.  Continue atorvastatin and Vascepa.  Check fasting lipids and a CMP today.  If his lipids remain significantly elevated, we will get an opinion from Dr. Debara Pickett about how else to manage his lipids.  Claudication ([place]) Overall PAD symptoms have been well-controlled.  He did get claudication when walking through the [place] airport.  We discussed increasing his walking for exercise.  He is currently going about 10 minutes at a time and we will ask him to increase the duration slowly.  Continue aspirin, atorvastatin, clopidogrel,  and Vascepa  Carotid stenosis Moderate blockage in the right carotid and mild on the left on carotid Dopplers 02/2021.  Continue annual imaging, antiplatelets and lipid management.    Current medicines are reviewed at length with the patient today.  The patient does not have concerns regarding medicines.  The following changes have been made:  Increase ranolazine to 1000 mg bid.  Labs/ tests ordered today include:   Orders Placed This Encounter  Procedures   Lipid panel   Comprehensive metabolic panel    Disposition:   FU with Lakevia Perris C. Oval Linsey, MD in 6 months.   I,Mathew Stumpf,acting as a Education administrator for Jason Latch, MD.,have documented all relevant documentation on the behalf of Jason Latch, MD,as directed by  Jason Latch, MD while in the presence of Jason Latch, MD.  I, Kelliher Oval Linsey, MD have reviewed all  documentation for this visit.  The documentation of the exam, diagnosis, procedures, and orders on 09/10/2021 are all accurate and complete.   Signed, Jason Latch, MD  09/10/2021 2:44 PM    [place]

## 2021-09-10 NOTE — Assessment & Plan Note (Signed)
Moderate blockage in the right carotid and mild on the left on carotid Dopplers 02/2021.  Continue annual imaging, antiplatelets and lipid management.

## 2021-09-11 LAB — COMPREHENSIVE METABOLIC PANEL
ALT: 21 IU/L (ref 0–44)
AST: 18 IU/L (ref 0–40)
Albumin/Globulin Ratio: 1.6 (ref 1.2–2.2)
Albumin: 4.3 g/dL (ref 3.8–4.8)
Alkaline Phosphatase: 75 IU/L (ref 44–121)
BUN/Creatinine Ratio: 10 (ref 10–24)
BUN: 12 mg/dL (ref 8–27)
Bilirubin Total: 0.2 mg/dL (ref 0.0–1.2)
CO2: 22 mmol/L (ref 20–29)
Calcium: 9.6 mg/dL (ref 8.6–10.2)
Chloride: 104 mmol/L (ref 96–106)
Creatinine, Ser: 1.2 mg/dL (ref 0.76–1.27)
Globulin, Total: 2.7 g/dL (ref 1.5–4.5)
Glucose: 106 mg/dL — ABNORMAL HIGH (ref 70–99)
Potassium: 4.5 mmol/L (ref 3.5–5.2)
Sodium: 141 mmol/L (ref 134–144)
Total Protein: 7 g/dL (ref 6.0–8.5)
eGFR: 67 mL/min/{1.73_m2} (ref >59)

## 2021-09-11 LAB — LIPID PANEL
Chol/HDL Ratio: 5.2 ratio — ABNORMAL HIGH (ref 0.0–5.0)
Cholesterol, Total: 94 mg/dL — ABNORMAL LOW (ref 100–199)
HDL: 18 mg/dL — ABNORMAL LOW (ref >39)
LDL Chol Calc (NIH): 34 mg/dL (ref 0–99)
Triglycerides: 275 mg/dL — ABNORMAL HIGH (ref 0–149)
VLDL Cholesterol Cal: 42 mg/dL — ABNORMAL HIGH (ref 5–40)

## 2021-09-14 ENCOUNTER — Encounter (HOSPITAL_BASED_OUTPATIENT_CLINIC_OR_DEPARTMENT_OTHER): Payer: Self-pay | Admitting: Cardiovascular Disease

## 2021-09-21 ENCOUNTER — Ambulatory Visit (INDEPENDENT_AMBULATORY_CARE_PROVIDER_SITE_OTHER): Payer: PPO | Admitting: Dietician

## 2021-09-21 ENCOUNTER — Encounter: Payer: Self-pay | Admitting: Dietician

## 2021-09-21 DIAGNOSIS — E1151 Type 2 diabetes mellitus with diabetic peripheral angiopathy without gangrene: Secondary | ICD-10-CM | POA: Diagnosis not present

## 2021-09-21 NOTE — Progress Notes (Signed)
Diabetes Self-Management Education  Visit Type: Follow-up (annual follow up 2- patient requested CGM sample/trial)  Appt. Start Time: 1115 Appt. End Time: 1150  This is a telephone encounter between Jason Velasquez  and [name] on 09/21/2021 for Diabetes Self Management Education & Support . The visit was conducted with the patient located at home and [name]  at [facility]. The patient's identity was confirmed using their DOB and current address. The patient has consented to being evaluated through a telephone encounter and understands the associated risks / benefits (allows the patient to remain at home, decreasing exposure to coronavirus). I personally spent 35 minutes on medical nutrition therapy discussion.   The following statements were read to the patient and/or legal guardian that are established with the Nutritional Health Provider   "By engaging in this telephone visit, you consent to the provision of healthcare.  Additionally, you authorize for your insurance to be billed for the services provided during this telephone visit."    Patient and/or legal guardian consented to telephone visit: yes  [name], identified by name and date of birth, is a 65 y.o. male with a diagnosis of Diabetes:  Marland Kitchen Type 2  ASSESSMENT   Patient with excellent blood sugar control. Discussed possible reasons for higher glucose in am and options to address this. He is thinking about increasing activity, but not ready to set goals today. He enjoyed wearing the free trail CGM Libre 3.   Woken up by lows- explained most likely due to compression vs false low in the first 24 hours Per his meter he noted that blood sugars are 100-140 fasting; Night 98, 97,  Learned pancakes with syrup was too much curious to know how meal at [facility] affected his blood sugar - [place] , ff cherry cobbler and diet pepsi  Sleeping fine; higher in am along with low HDL  and high triglycerides may be due to  Insulin  resistance- explained.  Physical activity- not as much as he would like to. Thinking about buying an e--bike.   Lipid Panel     Component Value Date/Time   CHOL 94 (L) 09/10/2021 1453   TRIG 275 (H) 09/10/2021 1453   HDL 18 (L) 09/10/2021 1453   CHOLHDL 5.2 (H) 09/10/2021 1453   CHOLHDL 3.6 08/12/2017 0334   VLDL 31 08/12/2017 0334   LDLCALC 34 09/10/2021 1453   LABVLDL 42 (H) 09/10/2021 1453    CGM Results from download:   % Time CGM active:   98 %   (Goal >70%)  Average glucose:   130 mg/dL for 14 days  Glucose management indicator:   6.4 %  Time in range (70-180 mg/dL):   96 %   (Goal >70%)  Time High (181-250 mg/dL):   4 %   (Goal < 25%)  Time Very High (>250 mg/dL):    0 %   (Goal < 5%)  Time Low (54-69 mg/dL):   0 %   (Goal <4%)  Time Very Low (<54 mg/dL):   0 %   (Goal <1%)  Coefficient of variation:   20.7 %   (Goal <36%)      Diabetes Self-Management Education - 09/21/21 1600       Visit Information   Visit Type Follow-up   annual follow up 2- patient requested CGM sample/trial     Patient Self-Evaluation of Goals - Patient rates self as meeting previously set goals (% of time)   Reducing Risk >75%   he asks  about coverage and csot of CGM and says he can use his meter. educated about paired testing and doing home experiments.     Post-Education Assessment   Patient understands using medications safely. Demonstrates understanding / competency    Patient understands monitoring blood glucose, interpreting and using results Demonstrates understanding / competency      Outcomes   Expected Outcomes Demonstrated interest in learning. Expect positive outcomes    Future DMSE Yearly    Program Status Completed      Subsequent Visit   Since your last visit have you had your blood pressure checked? No             Individualized Plan for Diabetes Self-Management Training:   Learning Objective:  Patient will have a greater understanding of diabetes  self-management. Patient education plan is to attend individual and/or group sessions per assessed needs and concerns.   Plan:   There are no Patient Instructions on file for this visit.  Expected Outcomes:  Demonstrated interest in learning. Expect positive outcomes  Education material provided: Diabetes Resources  If problems or questions, patient to contact team via:  Phone  Future DSME appointment: [name] [name], RD 09/21/2021 4:25 PM.

## 2021-09-21 NOTE — Patient Instructions (Signed)
Thank you for your visit today!  We talked about:   The Continuous glucose monitoring showed EXCELLENT blood sugar control. Way to go!!!  Higher fasting readings may be caused by insulin resistance.   Paired testing around meals and activity to see how that meal or activity affects your body.    You said you are thinking about being more active- may want to discuss this with your doctors before starting.   I suggest we follow up yeary or sooner as needed.   Please feel free to call me anytime.  Butch Penny [phone]

## 2021-10-15 ENCOUNTER — Other Ambulatory Visit: Payer: Self-pay

## 2021-10-15 ENCOUNTER — Encounter: Payer: Self-pay | Admitting: Internal Medicine

## 2021-10-15 ENCOUNTER — Ambulatory Visit (INDEPENDENT_AMBULATORY_CARE_PROVIDER_SITE_OTHER): Payer: PPO | Admitting: Internal Medicine

## 2021-10-15 VITALS — BP 136/85 | HR 84 | Temp 98.4°F | Ht 71.0 in | Wt 203.5 lb

## 2021-10-15 DIAGNOSIS — I200 Unstable angina: Secondary | ICD-10-CM | POA: Diagnosis not present

## 2021-10-15 DIAGNOSIS — I10 Essential (primary) hypertension: Secondary | ICD-10-CM

## 2021-10-15 DIAGNOSIS — E119 Type 2 diabetes mellitus without complications: Secondary | ICD-10-CM

## 2021-10-15 DIAGNOSIS — J309 Allergic rhinitis, unspecified: Secondary | ICD-10-CM

## 2021-10-15 DIAGNOSIS — E1151 Type 2 diabetes mellitus with diabetic peripheral angiopathy without gangrene: Secondary | ICD-10-CM

## 2021-10-15 DIAGNOSIS — Z23 Encounter for immunization: Secondary | ICD-10-CM | POA: Diagnosis not present

## 2021-10-15 LAB — POCT GLYCOSYLATED HEMOGLOBIN (HGB A1C): Hemoglobin A1C: 6.5 % — AB (ref 4.0–5.6)

## 2021-10-15 LAB — GLUCOSE, CAPILLARY: Glucose-Capillary: 151 mg/dL — ABNORMAL HIGH (ref 70–99)

## 2021-10-15 MED ORDER — FLUTICASONE PROPIONATE 50 MCG/ACT NA SUSP: 1.0000 | Freq: Every day | NASAL | 2 refills | Status: DC

## 2021-10-15 NOTE — Patient Instructions (Signed)
COVID-19 Vaccine Information can be found at: [email or web address] For questions related to vaccine distribution or appointments, please email [email or web address] or call [phone].    

## 2021-10-16 DIAGNOSIS — J309 Allergic rhinitis, unspecified: Secondary | ICD-10-CM | POA: Insufficient documentation

## 2021-10-16 NOTE — Assessment & Plan Note (Signed)
He reports increased cough in the morning and nasal congestion this results in frequent blowing of his nose first thing in the morning and causes occasional nosebleed.  Thinks it happens more to the right side than the left.  No fever or chills.  I discussed with him I think he has allergic rhinitis I would start with trying an intranasal corticosteroid to see if this helps his symptoms.

## 2021-10-16 NOTE — Progress Notes (Signed)
°  Subjective:  HPI: [name] is a 66 y.o. male who presents for DM, HTN  Please see Assessment and Plan below for the status of his chronic medical problems.  Objective:  Physical Exam: Vitals:   10/15/21 1046  BP: 136/85  Pulse: 84  Temp: 98.4 F (36.9 C)  TempSrc: Oral  SpO2: 100%  Weight: 203 lb 8 oz (92.3 kg)  Height: 5\' 11"  (1.803 m)   Body mass index is 28.38 kg/m. Physical Exam HENT:     Nose: Mucosal edema present. No congestion.     Comments: Some supperficial errythema of medial area of right inferior turbinate    Mouth/Throat:     Pharynx: Oropharynx is clear. No oropharyngeal exudate or uvula swelling.  Cardiovascular:     Rate and Rhythm: Normal rate and regular rhythm.  Pulmonary:     Effort: Pulmonary effort is normal.     Breath sounds: Normal breath sounds.   Assessment & Plan:  See Encounters Tab for problem based charting.  Medications Ordered Meds ordered this encounter  Medications   fluticasone (FLONASE) 50 MCG/ACT nasal spray    Sig: Place 1 spray into both nostrils daily.    Dispense:  16 g    Refill:  2   Other Orders Orders Placed This Encounter  Procedures   Flu Vaccine QUAD High Dose(Fluad)   Glucose, capillary   POC Hbg A1C   Follow Up: Return 3-6 months with dr Heber Smithton.

## 2021-10-16 NOTE — Assessment & Plan Note (Signed)
Continues to have good blood glucose control with an A1c of 6.4%.  Continue his current regimen of Synjardy 02-999 twice daily along with Trulicity 1.5 mg weekly.

## 2021-10-16 NOTE — Assessment & Plan Note (Signed)
He notes that his angina is actually improved with Ranexa Dr. Oval Linsey has been managing this.

## 2021-10-16 NOTE — Assessment & Plan Note (Signed)
His blood pressure is at goal today we will continue lisinopril 10 mg and metoprolol succinate 200 mg daily.

## 2021-10-17 ENCOUNTER — Other Ambulatory Visit: Payer: Self-pay | Admitting: Cardiovascular Disease

## 2021-11-06 ENCOUNTER — Other Ambulatory Visit: Payer: Self-pay

## 2021-11-06 DIAGNOSIS — E1151 Type 2 diabetes mellitus with diabetic peripheral angiopathy without gangrene: Secondary | ICD-10-CM

## 2021-11-06 NOTE — Telephone Encounter (Signed)
Empagliflozin-metFORMIN HCl (SYNJARDY) 02-999 MG TABS, REFILL REFILL @ [facility] ([place]),  - [address]  Pt would like to know if he can get this medication by today, states he has been out for 4 days.

## 2021-11-08 MED ORDER — SYNJARDY 5-1000 MG PO TABS: 1.0000 | ORAL_TABLET | Freq: Two times a day (BID) | ORAL | 3 refills | Status: DC

## 2021-11-19 DIAGNOSIS — H2513 Age-related nuclear cataract, bilateral: Secondary | ICD-10-CM | POA: Diagnosis not present

## 2021-11-19 DIAGNOSIS — E119 Type 2 diabetes mellitus without complications: Secondary | ICD-10-CM | POA: Diagnosis not present

## 2021-11-19 LAB — HM DIABETES EYE EXAM

## 2021-11-27 ENCOUNTER — Other Ambulatory Visit (HOSPITAL_COMMUNITY): Payer: Self-pay

## 2021-12-22 ENCOUNTER — Other Ambulatory Visit: Payer: Self-pay | Admitting: Cardiovascular Disease

## 2021-12-28 ENCOUNTER — Telehealth: Payer: Self-pay

## 2021-12-28 DIAGNOSIS — Z5181 Encounter for therapeutic drug level monitoring: Secondary | ICD-10-CM

## 2021-12-28 DIAGNOSIS — E785 Hyperlipidemia, unspecified: Secondary | ICD-10-CM

## 2021-12-28 DIAGNOSIS — I10 Essential (primary) hypertension: Secondary | ICD-10-CM

## 2021-12-28 NOTE — Telephone Encounter (Signed)
**Note De-Identified Markeis Allman Obfuscation** Vascepa PA started through covermymeds. ?Key: [id number] ?

## 2021-12-31 NOTE — Telephone Encounter (Signed)
Spoke with patient and advised working on getting PA  ?

## 2021-12-31 NOTE — Telephone Encounter (Signed)
F/u   Patient at front desk wanted MD to know that medication Vascepa not approved by insurance   VASCEPA 1 g capsule Take 2 capsules by mouth twice daily

## 2022-01-08 MED ORDER — ICOSAPENT ETHYL 1 G PO CAPS: 2.0000 g | ORAL_CAPSULE | Freq: Two times a day (BID) | ORAL | 3 refills | Status: DC

## 2022-01-08 NOTE — Telephone Encounter (Signed)
Received call from patients insurance generic Vascepa would be approved ? ?Advised patient and sent in new Rx  ? ? ? ? ? ?

## 2022-01-08 NOTE — Addendum Note (Signed)
Addended by: Alvina Filbert B on: 01/08/2022 02:34 PM ? ? Modules accepted: Orders ? ?

## 2022-01-08 NOTE — Telephone Encounter (Signed)
Will recheck LP/CMET in 2 months to make sure ok  ?

## 2022-02-09 ENCOUNTER — Ambulatory Visit (HOSPITAL_COMMUNITY)
Admission: RE | Admit: 2022-02-09 | Discharge: 2022-02-09 | Disposition: A | Discharge status: Home / Self Care | Payer: PPO | Source: Ambulatory Visit | Attending: Cardiology | Admitting: Cardiology

## 2022-02-09 DIAGNOSIS — I6523 Occlusion and stenosis of bilateral carotid arteries: Secondary | ICD-10-CM | POA: Insufficient documentation

## 2022-02-15 ENCOUNTER — Telehealth (HOSPITAL_BASED_OUTPATIENT_CLINIC_OR_DEPARTMENT_OTHER): Payer: Self-pay | Admitting: Cardiovascular Disease

## 2022-02-15 NOTE — Telephone Encounter (Signed)
? ?  Pt c/o medication issue: ? ?1. Name of Medication: ranolazine (RANEXA) 1000 MG SR tablet ? ?2. How are you currently taking this medication (dosage and times per day)? As directed  ? ?3. Are you having a reaction (difficulty breathing--STAT)? no ? ?4. What is your medication issue? Cost  ? ?Patient said the cost of the medication went up by $90 from the last time he filled it. It was not covered by his Medicare and he was not sure why it was denied  ?

## 2022-02-16 NOTE — Telephone Encounter (Signed)
Patient called in stating that his Ranexa was no longer covered and increased significantly in cost. RN completed covermymeds prior auth (Key: [id number]). Awaiting decision. ? ? ?Routing to [employer] as Juluis Rainier  ?

## 2022-02-16 NOTE — Telephone Encounter (Signed)
RN transferred a call from [place], calling in regards to [place], stating that it is covered on the patients formulary. RN explained that patient was told it was no longer covered and his price increased. Company rep states that the patient will require a tier exception, company rep to start putting that together and faxing to our office. Fax number confirmed. Will address Thursday upon return to office.  ?

## 2022-02-22 ENCOUNTER — Other Ambulatory Visit (HOSPITAL_BASED_OUTPATIENT_CLINIC_OR_DEPARTMENT_OTHER): Payer: Self-pay | Admitting: *Deleted

## 2022-02-22 DIAGNOSIS — I6523 Occlusion and stenosis of bilateral carotid arteries: Secondary | ICD-10-CM

## 2022-02-23 ENCOUNTER — Other Ambulatory Visit: Payer: Self-pay

## 2022-02-23 MED ORDER — TRULICITY 1.5 MG/0.5ML ~~LOC~~ SOAJ: 1.5000 mg | SUBCUTANEOUS | 5 refills | Status: DC

## 2022-02-23 NOTE — Telephone Encounter (Signed)
Please schedule follow up with me

## 2022-03-12 ENCOUNTER — Other Ambulatory Visit (HOSPITAL_COMMUNITY): Payer: Self-pay

## 2022-03-18 ENCOUNTER — Encounter: Payer: Self-pay | Admitting: Internal Medicine

## 2022-03-18 ENCOUNTER — Ambulatory Visit (INDEPENDENT_AMBULATORY_CARE_PROVIDER_SITE_OTHER): Payer: PPO | Admitting: Internal Medicine

## 2022-03-18 ENCOUNTER — Other Ambulatory Visit: Payer: Self-pay

## 2022-03-18 ENCOUNTER — Ambulatory Visit (HOSPITAL_BASED_OUTPATIENT_CLINIC_OR_DEPARTMENT_OTHER): Payer: PPO | Admitting: Cardiovascular Disease

## 2022-03-18 ENCOUNTER — Telehealth (HOSPITAL_BASED_OUTPATIENT_CLINIC_OR_DEPARTMENT_OTHER): Payer: Self-pay | Admitting: *Deleted

## 2022-03-18 ENCOUNTER — Encounter (HOSPITAL_BASED_OUTPATIENT_CLINIC_OR_DEPARTMENT_OTHER): Payer: Self-pay | Admitting: Cardiovascular Disease

## 2022-03-18 VITALS — BP 114/75 | HR 82 | Temp 98.1°F | Ht 71.0 in | Wt 198.6 lb

## 2022-03-18 DIAGNOSIS — I119 Hypertensive heart disease without heart failure: Secondary | ICD-10-CM

## 2022-03-18 DIAGNOSIS — Z7984 Long term (current) use of oral hypoglycemic drugs: Secondary | ICD-10-CM

## 2022-03-18 DIAGNOSIS — Z23 Encounter for immunization: Secondary | ICD-10-CM | POA: Diagnosis not present

## 2022-03-18 DIAGNOSIS — E1151 Type 2 diabetes mellitus with diabetic peripheral angiopathy without gangrene: Secondary | ICD-10-CM | POA: Diagnosis not present

## 2022-03-18 DIAGNOSIS — I739 Peripheral vascular disease, unspecified: Secondary | ICD-10-CM

## 2022-03-18 DIAGNOSIS — I10 Essential (primary) hypertension: Secondary | ICD-10-CM

## 2022-03-18 DIAGNOSIS — R3912 Poor urinary stream: Secondary | ICD-10-CM

## 2022-03-18 DIAGNOSIS — I25119 Atherosclerotic heart disease of native coronary artery with unspecified angina pectoris: Secondary | ICD-10-CM

## 2022-03-18 DIAGNOSIS — Z951 Presence of aortocoronary bypass graft: Secondary | ICD-10-CM

## 2022-03-18 DIAGNOSIS — E785 Hyperlipidemia, unspecified: Secondary | ICD-10-CM

## 2022-03-18 DIAGNOSIS — N401 Enlarged prostate with lower urinary tract symptoms: Secondary | ICD-10-CM

## 2022-03-18 LAB — POCT GLYCOSYLATED HEMOGLOBIN (HGB A1C): Hemoglobin A1C: 6.3 % — AB (ref 4.0–5.6)

## 2022-03-18 LAB — GLUCOSE, CAPILLARY: Glucose-Capillary: 111 mg/dL — ABNORMAL HIGH (ref 70–99)

## 2022-03-18 MED ORDER — ZOSTER VAC RECOMB ADJUVANTED 50 MCG/0.5ML IM SUSR: 0.5000 mL | Freq: Once | INTRAMUSCULAR | 0 refills | Status: AC

## 2022-03-18 MED ORDER — ATORVASTATIN CALCIUM 80 MG PO TABS: 80.0000 mg | ORAL_TABLET | Freq: Every evening | ORAL | 3 refills | Status: DC

## 2022-03-18 MED ORDER — METOPROLOL SUCCINATE ER 200 MG PO TB24
200.0000 mg | ORAL_TABLET | Freq: Every day | ORAL | 3 refills | Status: DC
Start: 2022-03-18 — End: 2023-05-10

## 2022-03-18 NOTE — Assessment & Plan Note (Signed)
Taking all medications as directed, Glucometer reivewed and overall control is excellent.  A1c is 6.3%.  COntinue trulicity, synjardy.

## 2022-03-18 NOTE — Assessment & Plan Note (Signed)
He continues to have claudication.  Continue medical management and encouraged him to increase his exercise.

## 2022-03-18 NOTE — Patient Instructions (Signed)
Medication Instructions:  Your physician recommends that you continue on your current medications as directed. Please refer to the Current Medication list given to you today.   *If you need a refill on your cardiac medications before your next appointment, please call your pharmacy*  Lab Work: NONE  Testing/Procedures: NONE  Follow-Up: At [facility], you and your health needs are our priority.  As part of our continuing mission to provide you with exceptional heart care, we have created designated Provider Care Teams.  These Care Teams include your primary Cardiologist (physician) and Advanced Practice Providers (APPs -  Physician Assistants and Nurse Practitioners) who all work together to provide you with the care you need, when you need it.  We recommend signing up for the patient portal called "MyChart".  Sign up information is provided on this After Visit Summary.  MyChart is used to connect with patients for Virtual Visits (Telemedicine).  Patients are able to view lab/test results, encounter notes, upcoming appointments, etc.  Non-urgent messages can be sent to your provider as well.   To learn more about what you can do with MyChart, go to [email or web address].    Your next appointment:   6 month(s)  The format for your next appointment:   In Person  Provider:   Tiffany Rock, MD            

## 2022-03-18 NOTE — Assessment & Plan Note (Signed)
Continues to report some weak stream. No significant nocturia.  Symptoms not overtly bothersome.  Discussed potentially check PSA but declined.  LUT Symptoms not bothersome enough yet for treatment. CTM.

## 2022-03-18 NOTE — Assessment & Plan Note (Signed)
BP is well controlled today, no orthostasis.  Continue lisinopril '10mg'$  daily and toprol xl '200mg'$  daily.

## 2022-03-18 NOTE — Assessment & Plan Note (Signed)
Anginal symptoms overall controlled with Imdur, Has had an issue affording ranexa but he is being proactive with reaching out to cardiology.

## 2022-03-18 NOTE — Telephone Encounter (Signed)
Patient in the office today and had letter from insurance company that generic Ranexa denied secondary to not being eligible for tier exception   Spoke with pharmacy and they did get it to go through on insurance with cost of $4.15 for 90 day supply  Left message to call back

## 2022-03-18 NOTE — Assessment & Plan Note (Signed)
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  Cath in 2018 found a 99% distal left main/ostial LAD lesion.  His SVG to the PDA and SVG to OM were completely occluded.  He had minimal plaque in the LIMA to LAD.  He underwent successful PCI of the left main/LAD/left circumflex lesion and has done much better since that time.  His angina has been better managed since Ranexa was increased.  He is on maximal anginal therapy with aspirin, clopidogrel (indefinitely), Imdur, and Ranexa.  He did receive information that his Ranexa was not approved going forward.  We will help look into this as this is a necessary medicine for him to live life without angina.  He was increased to increase his exercise to at least 150 minutes weekly.

## 2022-03-18 NOTE — Assessment & Plan Note (Signed)
Lipids are much better controlled.  His triglycerides have improved tremendously since adding Vascepa.  Continue atorvastatin and Vascepa.  LDL is at goal.

## 2022-03-18 NOTE — Progress Notes (Signed)
Cardiology Office Note  Date:  03/18/2022   ID:  Jason Velasquez, DOB October 11, 1955, MRN [id number]  PCP:  [name], DO  Cardiologist:   Skeet Latch, MD   No chief complaint on file.    Patient ID: Jason Velasquez is a 66 y.o. male with CAD s/p CABG ( LIMA-->LAD, SVG-->PDA, SVG-->OM1), multiple PCIs , chronic stable angina, PAD, hypertension, diabetes and CVA who presents for follow up.  [name] was first evaluated for chest pain on 07/27/15.  He previously underwent CABG in 2007. He had a nuclear stress test on 08/21/15 revealed LVEF 57% with moderate ischemia in the inferoseptal, inferior, and anterior locations. He subsequently underwent left heart catheterization on 08/26/15 that revealed severe three vessel disease including a 90% left main lesion and an occluded RCA and LAD. His LIMA to the LAD was patent but all other vein grafts were occluded. There were no optimal PCI targets.  He was started on carvedilol and Ranexa. [name] also underwent ABI testing 08/2015 that revealed diffuse atherosclerosis from the proximal abdominal aorta through the common and external iliac arteries bilaterally.  There was no detectable flow in the L peroneal or anterior tibial arteries.  ABIs were near 0.8 bilaterally.  He was referred to Dr. Gwenlyn Velasquez where medical management was advised. He was noted to have carotid bruits and was referred for carotid ultrasound on 12/09/15.  This revealed 40-59% stenosis on the right and 1-39% stenosis on the left.  He had repeat LE Dopplers and ABIs 03/2017 that were unchanged from prior.  Medical management was recommended.    [name] continued to have worsening angina despite optimal medical therapy.  He was referred for [facility]  05/2017 where he was Velasquez to have progression of the distal LM/LAD/LCx lesion.  He underwent DES placement in the ostial LCx, mid LCx, and ostial LAD.  The ostial LAD was treated with a cutting ballpon reducing the stenosis from 85% to 30%.  It was  recommended that he continue clopidogrel and aspirin indefinitely.  He had an echo 06/01/17 that showed LVEF 65-70% with grade 1 diastolic dysfunction.  He followed up on 07/2017 and continued to have angina especially at night.  There is some concern that it may also be GERD.  Therefore he was given a trial of pantoprazole and Imdur 60 mg daily was restarted.  He called our office 11/7 with progressive symptoms.  He was referred for cardiac catheterization but was Velasquez to be anemic with a hemoglobin of 7.7.  He was transfused and underwent upper endoscopy on 11/9.  He was Velasquez to have a hiatal hernia and 2 AVMs that were treated with APC.  He also received IV iron.  He subsequently underwent left heart catheterization that again showed severe three-vessel disease not amenable to PCI.  He continues to have refractory angina despite optimal medical therapy.  He had another left heart catheterization 01/25/2018 at which time his ostial LAD was Velasquez to be 99% stenosed.  A Sierra DES was successfully implanted.  At the last appointment he had occasional chest pain and ranolazine was increased.  Since that time he denies recurrent angina.  Lipids improved significantly Vascepa.  Today, he states that he is doing well, and adds that he has not been exercising as much as he wants to. He currently exercises twice a week by walking as much as he can before his legs give him discomfort. His legs still give him discomfort with exertion. The patient denies  chest pain, shortness of breath, nocturnal dyspnea, orthopnea or peripheral edema. There have been no palpitations, lightheadedness or syncope.  Past Medical History:  Diagnosis Date   Angina, class III ([place]) 08/24/2015   Arthritis    "hands" (01/25/2018)   AVM (arteriovenous malformation) 08/15/2017   CAD (coronary artery disease) of artery bypass graft    CABG- 2007    Carotid stenosis 03/03/2016   28-36% RICA, 6-29% LICA, >47% RECA stenosis Repeat 12/2016.    Claudication [facility])    Controlled type 2 diabetes mellitus without complication ([place]) 03/08/4649   Dx March of 2016. GAD65 negative Anti Islet cell negative     DKA (diabetic ketoacidoses) 12/07/2014   Dyspnea    History of blood transfusion ~ 08/2017   "related to LGIB"   History of hiatal hernia    Hx of adenomatous colonic polyps 05/27/2015   Hypercholesterolemia    Hypertension    Hypertensive heart disease 03/03/2016   Iron deficiency anemia due to chronic blood loss suspect from AVMs on Plavix    Left nephrolithiasis 12/07/2014   Migraine    "only in my 65's" (01/25/2018)   Non-ST elevation (NSTEMI) myocardial infarction [facility])    Pneumonia ~ 1990   Stroke ([place]) 07/12/2014   "I didn't realize I'd had one; hospital Velasquez it"; denies residual on 01/25/2018   Tubular adenoma of colon 05/31/2015   Colonoscopy 05/20/15 with [name]: 3 tubullar adenoma's largest 2.3cm.  Plan for repeat Colonoscopy in 2019.    Past Surgical History:  Procedure Laterality Date   CARDIAC CATHETERIZATION N/A 08/26/2015   Procedure: Left Heart Cath and Cors/Grafts Angiography;  Surgeon: [name], MD;  Location: [place] CV LAB;  Service: Cardiovascular;  Laterality: N/A;   CORONARY ARTERY BYPASS GRAFT  2007   "CABG X3", at [place] 05/25/2017   Procedure: [place];  Surgeon: [name], MD;  Location: [place] CV LAB;  Service: Cardiovascular;  Laterality: N/A;   CORONARY BALLOON ANGIOPLASTY N/A 01/25/2018   Procedure: CORONARY BALLOON ANGIOPLASTY;  Surgeon: [name], MD;  Location: [place] CV LAB;  Service: Cardiovascular;  Laterality: N/A;   CORONARY STENT INTERVENTION N/A 05/25/2017   Procedure: CORONARY STENT INTERVENTION;  Surgeon: [name], MD;  Location: [place] CV LAB;  Service: Cardiovascular;  Laterality: N/A;   CORONARY STENT INTERVENTION N/A 01/25/2018   Procedure: CORONARY STENT  INTERVENTION;  Surgeon: [name], MD;  Location: [place] CV LAB;  Service: Cardiovascular;  Laterality: N/A;   ESOPHAGOGASTRODUODENOSCOPY N/A 08/12/2017   Procedure: ESOPHAGOGASTRODUODENOSCOPY (EGD);  Surgeon: Jerene Bears, MD;  Location: [facility] ENDOSCOPY;  Service: Gastroenterology;  Laterality: N/A;   LEFT HEART CATH AND CORS/GRAFTS ANGIOGRAPHY N/A 05/23/2017   Procedure: LEFT HEART CATH AND CORS/GRAFTS ANGIOGRAPHY;  Surgeon: [name], MD;  Location: [place] CV LAB;  Service: Cardiovascular;  Laterality: N/A;   LEFT HEART CATH AND CORS/GRAFTS ANGIOGRAPHY N/A 08/15/2017   Procedure: LEFT HEART CATH AND CORS/GRAFTS ANGIOGRAPHY;  Surgeon: Burnell Blanks, MD;  Location: [place] [facility] LAB;  Service: Cardiovascular;  Laterality: N/A;   LEFT HEART CATH AND CORS/GRAFTS ANGIOGRAPHY N/A 01/25/2018   Procedure: LEFT HEART CATH AND CORS/GRAFTS ANGIOGRAPHY;  Surgeon: [name], MD;  Location: [place] [facility] LAB;  Service: Cardiovascular;  Laterality: N/A;    Current Outpatient Medications  Medication Sig Dispense Refill   acetaminophen (TYLENOL) 500 MG tablet Take 1,000 mg by mouth every  6 (six) hours as needed for moderate pain or headache.     aspirin EC 81 MG tablet Take 81 mg by mouth daily.     atorvastatin (LIPITOR) 80 MG tablet Take 1 tablet (80 mg total) by mouth every evening. 90 tablet 3   clopidogrel (PLAVIX) 75 MG tablet Take 1 tablet by mouth once daily 90 tablet 3   Dulaglutide (TRULICITY) 1.5 ER/7.4YC SOPN Inject 1.5 mg into the skin once a week. 2 mL 5   Empagliflozin-metFORMIN HCl (SYNJARDY) 02-999 MG TABS Take 1 tablet by mouth 2 (two) times daily with a meal. 180 tablet 3   ferrous sulfate 325 (65 FE) MG tablet Take 1 tablet (325 mg total) by mouth every other day. 100 tablet 1   fluticasone (FLONASE) 50 MCG/ACT nasal spray Place 1 spray into both nostrils daily. 16 g 2   glucose blood (ONETOUCH VERIO) test strip Use as instructed 100 each 11    Hypromellose (ARTIFICIAL TEARS OP) Place 1 drop into both eyes daily as needed (for dry eyes).      icosapent Ethyl (VASCEPA) 1 g capsule Take 2 capsules (2 g total) by mouth 2 (two) times daily. 360 capsule 3   isosorbide mononitrate (IMDUR) 120 MG 24 hr tablet TAKE 1/2 (ONE-HALF) TABLET BY MOUTH IN THE MORNING 1 TABLET IN THE EVENING 135 tablet 1   Lancets (ONETOUCH DELICA PLUS [id number]) MISC 1 Device by Does not apply route in the morning and at bedtime. 100 each 11   lisinopril (ZESTRIL) 10 MG tablet Take 1 tablet (10 mg total) by mouth daily. 90 tablet 3   metoprolol (TOPROL-XL) 200 MG 24 hr tablet Take 1 tablet (200 mg total) by mouth daily. 90 tablet 3   nitroGLYCERIN (NITROSTAT) 0.4 MG SL tablet Place 1 tablet (0.4 mg total) under the tongue every 5 (five) minutes as needed for chest pain. 25 tablet 2   ranolazine (RANEXA) 1000 MG SR tablet Take 1 tablet (1,000 mg total) by mouth 2 (two) times daily. 180 tablet 3   Zoster Vaccine Adjuvanted [facility]) injection Inject 0.5 mLs into the muscle once for 1 dose. 0.5 mL 0   No current facility-administered medications for this visit.    Allergies:   Patient has no known allergies.    Social History:  The patient  reports that he quit smoking about 12 years ago. His smoking use included cigarettes. He has a 76.00 pack-year smoking history. He has never used smokeless tobacco. He reports current alcohol use. He reports that he does not use drugs.   Family History:  The patient's family history includes Crohn's disease in his daughter; Diabetes in his father; Hypertension in his mother.    ROS:   Please see the history of present illness. (+) Pain in the lower extremities All other systems are reviewed and negative.    PHYSICAL EXAM: VS:  BP 110/68 (BP Location: Left Arm, Patient Position: Sitting, Cuff Size: Normal)   Pulse 85   Ht '5\' 11"'$  (1.803 m)   Wt 201 lb (91.2 kg)   BMI 28.03 kg/m  , BMI Body mass index is 28.03  kg/m. GENERAL:  Well appearing HEENT: Pupils equal round and reactive, fundi not visualized, oral mucosa unremarkable NECK:  No jugular venous distention, waveform within normal limits, carotid upstroke brisk and symmetric, no bruits, no thyromegaly LYMPHATICS:  No cervical adenopathy LUNGS:  Clear to auscultation bilaterally HEART:  RRR.  PMI not displaced or sustained,S1 and S2 within normal limits, no  S3, no S4, no clicks, no rubs, no murmurs ABD:  Flat, positive bowel sounds normal in frequency in pitch, no bruits, no rebound, no guarding, no midline pulsatile mass, no hepatomegaly, no splenomegaly EXT:  2 plus radial pulses.  1+DP/TP bilaterally, no edema, no cyanosis no clubbing SKIN:  No rashes no nodules NEURO:  Cranial nerves II through XII grossly intact, motor grossly intact throughout PSYCH:  Cognitively intact, oriented to person place and time  EKG:  EKG is personally reviewed.  03/18/2022 EKG: Rate 85. Sinus Rhythm. 09/10/2021: EKG was not ordered. 01/30/2020: Sinus rhythm.  Rate 79 bpm.  Prior inferior infarct. 07/30/19: Sinus rhythm.  Rate 71 bpm.  Prior inferior infarct 08/11/17: Sinus rhythm.  Rate 77 bpm.    07/18/17: Sinus rhythm.  Rate 71 bpm.  05/18/17: Sinus rhythm. Rate 87 bpm. B 1 PVCs. 12/29/16: Sinus rhythm.  Incomplete RBBB.  Prior inferior infarct. 07/02/16: Sinus rhythm rate 75 bpm.  Prior inferior infarct  12/02/2015: Sinus rhythm rate 82 bpm.  Prior inferior infarct.   ABI 02/09/2021: Bilateral ABIs appear essentially unchanged compared to prior study on  02/08/2020. Left TBIs appear decreased compared to prior study on  02/08/2020. Right TBIs appear increased compared to prior study on  02/08/2020.     Summary:  Right: Resting right ankle-brachial index indicates moderate right lower  extremity arterial disease. The right toe-brachial index is normal.   TBIs increased by .11.  Left: Resting left ankle-brachial index indicates mild left lower  extremity  arterial disease. The left toe-brachial index is abnormal.   TBIs decreased by .06.   Carotid Duplex 02/09/2021: Summary:  Right Carotid: Velocities in the right ICA are consistent with a 60-79%   stenosis. Non-hemodynamically significant plaque <50% noted  in the CCA. Stable RICA velocities.   Left Carotid: Velocities in the left ICA are consistent with a 1-39%  stenosis. Stable LICA velocities.   Vertebrals:  Bilateral vertebral arteries demonstrate antegrade flow.  Subclavians: Normal flow hemodynamics were seen in bilateral subclavian arteries.   LHC 01/25/2018: CULPRIT: Ost LAD to Prox LAD lesion is 99% stenosed. A drug-eluting stent was successfully placed using a STENT SIERRA 3.00 X 15 MM. Post-dilated to 3.3 mm Post intervention, there is a 0% residual stenosis. Prox LAD to Mid LAD lesion is 100% stenosed after several septal perforators & diagonal branches. Ost Cx to Prox Cx lesion is 55% stenosed after LAD stent -- PTCA only -- Balloon angioplasty was performed using a BALLOON EMERGE MR PUSH 1.20X12. Post intervention, there is a 35% residual stenosis. Non-stenotic Ost LM to Dist LM lesion previously treated with DES - patent. Ost 1st Mrg lesion is 80% stenosed. - Unable to cross lesion with balloon. Prox Cx to Mid Cx lesion previously treated with DES - patent after ostial ISR. Mid RCA lesion is 100% stenosed. Ost RCA to Prox RCA lesion is 70% stenosed. Known occluson of SVG-RPDA & SVG-OM LIMA and is normal in caliber. Prox Graft lesion is 20% stenosed. The graft exhibits minimal luminal irregularities. LV end diastolic pressure is moderately elevated. There is no aortic valve stenosis. Post intervention, there is a 0% residual stenosis.   Successful DES stent in the proximal and ostial LAD.  Unfortunately the stent did shift into the left main creating a somewhat culotte technique crossing the circumflex.  I was able to wire the circumflex and balloon the stent struts with  a 1.2 mm balloon.  However guide support did not allow larger balloon to cross.  Unable to cross into the OM1 lesion due to tortuosity and multiple stent struts.   Plan: Based on how long the procedure was, with gentle hydration.We will monitor him overnight Zephyr band removal per protocol Continue aggressive risk factor modification and antianginal therapy.  LHC 08/15/17: Mid RCA lesion is 100% stenosed. Ost RCA to Prox RCA lesion is 70% stenosed. Ost 1st Mrg lesion is 60% stenosed. Previously placed Ost 1st Mrg to 1st Mrg stent (unknown type) is widely patent. Origin lesion is 100% stenosed. Origin lesion is 100% stenosed. Prox Graft lesion is 20% stenosed. Prox LAD to Mid LAD lesion is 100% stenosed. Previously placed Ost Cx to Prox Cx stent (unknown type) is widely patent. Previously placed Ost LM to Dist LM stent (unknown type) is widely patent. Previously placed Prox Cx to Mid Cx stent (unknown type) is widely patent. SVG graft was not visualized. SVG graft was not visualized. Post intervention, there is a -1% residual stenosis. LIMA graft was visualized by angiography and is normal in caliber. The graft exhibits minimal luminal irregularities. Post intervention, there is a -1% residual stenosis. Ost LAD to Prox LAD lesion is 99% stenosed. Post intervention, there is a -1% residual stenosis.   1. Severe triple vessel CAD s/p CABG with 1/3 patent bypass grafts.  2. The LAD is known to be chronically occluded in the mid segment. The mid and distal LAD fills from the patent LIMA graft. The ostial LAD is jailed by the stent extending from the left main into the Circumflex. The proximal LAD has a 99% stenosis following by an aneurysmal segment then a total occlusion.  3. The left main stent is patent 4. The ostial/proximal Circumflex stent is patent. The first OM stent is patent. There is a 60% ostial stenosis in the first OM branch just prior to the stent. The SVG to the OM is  known to be occluded. The mid Circumflex stent is patent.  5. The native RCA has a long 70% proximal stenosis and 100% mid chronic occlusion. The vein graft the to distal RCA is known to be occluded and was not injected.  6. Normal filling pressures   Recommendations: Continue medical management of CAD. No focal targets for PCI. The proximal LAD stenosis is severe but the majority of this territory is supplied by the LIMA graft and the LAD is jailed by the LM/Circ stent. I suspect that his anemia has contributed to his recent chest pain given his severe, diffuse CAD.     Echo 06/01/17: Study Conclusions   - Procedure narrative: Transthoracic echocardiography. Image   quality was poor. Intravenous contrast (Definity) was   administered. - Left ventricle: The cavity size was normal. There was mild focal   basal hypertrophy of the septum. Systolic function was vigorous.   The estimated ejection fraction was in the range of 65% to 70%.   Wall motion was normal; there were no regional wall motion   abnormalities. Doppler parameters are consistent with abnormal   left ventricular relaxation (grade 1 diastolic dysfunction). - Aortic valve: Trileaflet; mildly thickened, mildly calcified   leaflets. - Left atrium: The atrium was mildly dilated.    Lexiscan Cardiolite 08/21/15: Nuclear stress EF: 57%. The left ventricular ejection fraction is normal (55-65%). There was no ST segment deviation noted during stress. No T wave inversion was noted during stress. Defect 1: There is a small defect of moderate severity present in the basal inferoseptal and basal inferior location. Defect 2: There is a defect  present in the basal anterior, mid anterior and apical anterior location. Findings consistent with ischemia. This is a high risk study.   High risk stress nuclear study with two areas of ischemia and normal left ventricular regional and global systolic function. The inferior area of ischemia is  small. The anterior area of ischemia is extensive, but appears to be very mild. The septum is spared.  Carotid Doppler 12/11/15: 30-86% RICA, 5-78% LICA, >46% RECA stenosis  ABI 08/12/15: R 0.81, L 0.89 Diffuse atherosclerosis from the proximal abdominal aorta into both tibial arteries. Several areas of narrowing are noted in both lower extremities, although no focal significant stenosis is identified. One vessel run-off on the right, and three vessel run-off on the left.  ABI 02/03/17: R 0.81, L 0.91 Abnormal lower arterial Doppler study, with waveforms suggestive of inflow disease. The bilateral ABI's are stable and in the mild range. The right great toe pressure is normal. The left great toe pressure is abnormal   Recent Labs: 09/10/2021: ALT 21; BUN 12; Creatinine, Ser 1.20; Potassium 4.5; Sodium 141    Lipid Panel    Component Value Date/Time   CHOL 94 (L) 09/10/2021 1453   TRIG 275 (H) 09/10/2021 1453   HDL 18 (L) 09/10/2021 1453   CHOLHDL 5.2 (H) 09/10/2021 1453   CHOLHDL 3.6 08/12/2017 0334   VLDL 31 08/12/2017 0334   LDLCALC 34 09/10/2021 1453      Wt Readings from Last 3 Encounters:  03/18/22 201 lb (91.2 kg)  03/18/22 198 lb 9.6 oz (90.1 kg)  10/15/21 203 lb 8 oz (92.3 kg)      ASSESSMENT AND PLAN:  Hypertensive heart disease Blood pressure is very well-controlled on his current regimen.  Continue metoprolol, lisinopril, and Imdur.  S/P CABG (coronary artery bypass graft) Mr. Wrightsman has extensive CAD s/p CABG and PCI.  Cath in 2018 Velasquez a 99% distal left main/ostial LAD lesion.  His SVG to the PDA and SVG to OM were completely occluded.  He had minimal plaque in the LIMA to LAD.  He underwent successful PCI of the left main/LAD/left circumflex lesion and has done much better since that time.  His angina has been better managed since Ranexa was increased.  He is on maximal anginal therapy with aspirin, clopidogrel (indefinitely), Imdur, and Ranexa.  He did  receive information that his Ranexa was not approved going forward.  We will help look into this as this is a necessary medicine for him to live life without angina.  He was increased to increase his exercise to at least 150 minutes weekly.  Dyslipidemia Lipids are much better controlled.  His triglycerides have improved tremendously since adding Vascepa.  Continue atorvastatin and Vascepa.  LDL is at goal.  Claudication [facility]) He continues to have claudication.  Continue medical management and encouraged him to increase his exercise.  Current medicines are reviewed at length with the patient today.  The patient does not have concerns regarding medicines.  The following changes have been made:  Increase ranolazine to 1000 mg bid.  Labs/ tests ordered today include:   Orders Placed This Encounter  Procedures   EKG 12-Lead    Disposition:   FU with Minh Roanhorse C. Oval Linsey, MD in 6 months.    I,Tinashe Williams,acting as a Education administrator for [place], MD.,have documented all relevant documentation on the behalf of Skeet Latch, MD,as directed by  Skeet Latch, MD while in the presence of Skeet Latch, MD.   I, Onycha Oval Linsey, MD  have reviewed all documentation for this visit.  The documentation of the exam, diagnosis, procedures, and orders on 03/18/2022 are all accurate and complete.

## 2022-03-18 NOTE — Assessment & Plan Note (Signed)
Blood pressure is very well-controlled on his current regimen.  Continue metoprolol, lisinopril, and Imdur.

## 2022-03-18 NOTE — Progress Notes (Signed)
Established Patient Office Visit  Subjective   Patient ID: London Tarnowski, male    DOB: 1956/01/03  Age: 66 y.o. MRN: [id number]  Chief Complaint  Patient presents with   Check-up Visit    Rodrigus reports he is overall feeling well.  He does note he has had an issue with the cost of ranexa for which he is reaching out to Dr Natasha Mead office.  He brings some paperwork from a recent home visit focused on preventative care.  He brings his glucometer for review.  Notes there were some abnromally high readings that were a mistake and he new to recheck.  No hypoglycemia.      Objective:     BP 114/75 (BP Location: Right Arm, Patient Position: Sitting, Cuff Size: Normal)   Pulse 82   Temp 98.1 F (36.7 C) (Oral)   Ht '5\' 11"'$  (1.803 m)   Wt 198 lb 9.6 oz (90.1 kg)   SpO2 100% Comment: RA  BMI 27.70 kg/m  Wt Readings from Last 3 Encounters:  03/18/22 198 lb 9.6 oz (90.1 kg)  10/15/21 203 lb 8 oz (92.3 kg)  09/10/21 200 lb 6.4 oz (90.9 kg)      Physical Exam   Results for orders placed or performed in visit on 03/18/22  Glucose, capillary  Result Value Ref Range   Glucose-Capillary 111 (H) 70 - 99 mg/dL  POC Hbg A1C  Result Value Ref Range   Hemoglobin A1C 6.3 (A) 4.0 - 5.6 %   HbA1c POC (<> result, manual entry)     HbA1c, POC (prediabetic range)     HbA1c, POC (controlled diabetic range)      Last hemoglobin A1c Lab Results  Component Value Date   HGBA1C 6.3 (A) 03/18/2022      The ASCVD Risk score (Arnett DK, et al., 2019) failed to calculate for the following reasons:   The valid HDL cholesterol range is 20 to 100 mg/dL   The valid total cholesterol range is 130 to 320 mg/dL    Assessment & Plan:   Problem List Items Addressed This Visit       Cardiovascular and Mediastinum   Essential hypertension (Chronic)    BP is well controlled today, no orthostasis.  Continue lisinopril '10mg'$  daily and toprol xl '200mg'$  daily.      Relevant Medications   atorvastatin  (LIPITOR) 80 MG tablet   metoprolol (TOPROL-XL) 200 MG 24 hr tablet   DM (diabetes mellitus), type 2 with peripheral vascular complications (HCC) - Primary (Chronic)    Taking all medications as directed, Glucometer reivewed and overall control is excellent.  A1c is 6.3%.  COntinue trulicity, synjardy.      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      Relevant Medications   atorvastatin (LIPITOR) 80 MG tablet   metoprolol (TOPROL-XL) 200 MG 24 hr tablet   Hypertensive heart disease   Relevant Medications   atorvastatin (LIPITOR) 80 MG tablet   metoprolol (TOPROL-XL) 200 MG 24 hr tablet     Genitourinary   Benign prostatic hyperplasia  Continues to report some weak stream. No significant nocturia.  Symptoms not overtly bothersome.  Discussed potentially check PSA but declined.  LUT Symptoms not bothersome enough yet for treatment. CTM.      Other Visit Diagnoses     Need for prophylactic vaccination against Streptococcus pneumoniae (pneumococcus)       Relevant Orders   Pneumococcal conjugate vaccine 20-valent (Prevnar 20) (Completed)       Return in about 6 months (around 09/17/2022).    [name], DO

## 2022-03-19 LAB — MICROALBUMIN / CREATININE URINE RATIO
Creatinine, Urine: 101.3 mg/dL
Microalb/Creat Ratio: <3 mg/g creat (ref 0–29)
Microalbumin, Urine: <3 ug/mL

## 2022-03-19 NOTE — Telephone Encounter (Signed)
Left message to call back  

## 2022-03-20 ENCOUNTER — Other Ambulatory Visit: Payer: Self-pay | Admitting: Cardiovascular Disease

## 2022-03-22 NOTE — Telephone Encounter (Signed)
Rx request sent to pharmacy.  

## 2022-03-24 NOTE — Telephone Encounter (Signed)
Advised patient, verbalized understanding He will call back if any further issues

## 2022-04-07 ENCOUNTER — Other Ambulatory Visit: Payer: Self-pay

## 2022-04-07 MED ORDER — ONETOUCH VERIO VI STRP
ORAL_STRIP | 11 refills | Status: DC
Start: 2022-04-07 — End: 2023-05-10

## 2022-04-07 MED ORDER — ONETOUCH DELICA PLUS LANCET33G MISC: 1.0000 | Freq: Two times a day (BID) | 11 refills | Status: DC

## 2022-04-08 ENCOUNTER — Encounter: Payer: PPO | Admitting: Internal Medicine

## 2022-05-12 ENCOUNTER — Ambulatory Visit: Payer: Self-pay

## 2022-05-12 NOTE — Patient Outreach (Signed)
  Care Coordination   Initial Visit Note   05/12/2022 Name: Jason Velasquez MRN: [id number] DOB: 04/11/1956  Jason Velasquez is a 66 y.o. year old male who sees [name], DO for primary care. I spoke with  Jason Velasquez by phone today  What matters to the patients health and wellness today?  "I am doing good, do not need assistance with any medical issues.    Goals Addressed   None     SDOH assessments and interventions completed:  Yes  SDOH Interventions Today    Flowsheet Row Most Recent Value  SDOH Interventions   Housing Interventions Intervention Not Indicated  Transportation Interventions Intervention Not Indicated        Care Coordination Interventions Activated:  No  Care Coordination Interventions:  No, not indicated   Follow up plan: No further intervention required.   Encounter Outcome:  Pt. Visit Completed

## 2022-06-22 ENCOUNTER — Other Ambulatory Visit (HOSPITAL_COMMUNITY): Payer: Self-pay

## 2022-07-07 ENCOUNTER — Other Ambulatory Visit: Payer: Self-pay | Admitting: Cardiovascular Disease

## 2022-08-18 ENCOUNTER — Other Ambulatory Visit: Payer: Self-pay | Admitting: *Deleted

## 2022-08-19 ENCOUNTER — Other Ambulatory Visit: Payer: Self-pay

## 2022-08-20 MED ORDER — TRULICITY 1.5 MG/0.5ML ~~LOC~~ SOAJ: 1.5000 mg | SUBCUTANEOUS | 5 refills | Status: DC

## 2022-09-16 ENCOUNTER — Other Ambulatory Visit: Payer: Self-pay | Admitting: Cardiovascular Disease

## 2022-09-16 ENCOUNTER — Other Ambulatory Visit (HOSPITAL_COMMUNITY): Payer: Self-pay

## 2022-09-16 MED ORDER — LISINOPRIL 10 MG PO TABS
10.0000 mg | ORAL_TABLET | Freq: Every day | ORAL | 1 refills | Status: DC
  Filled 2022-09-16: qty 90, 90d supply, fill #0
  Filled 2023-02-04: qty 90, 90d supply, fill #1

## 2022-09-16 NOTE — Telephone Encounter (Signed)
Rx request sent to pharmacy.  

## 2022-09-23 ENCOUNTER — Ambulatory Visit (INDEPENDENT_AMBULATORY_CARE_PROVIDER_SITE_OTHER): Payer: PPO | Admitting: Internal Medicine

## 2022-09-23 ENCOUNTER — Ambulatory Visit: Payer: PPO

## 2022-09-23 ENCOUNTER — Other Ambulatory Visit: Payer: Self-pay

## 2022-09-23 ENCOUNTER — Encounter: Payer: Self-pay | Admitting: Internal Medicine

## 2022-09-23 VITALS — BP 104/74 | HR 75 | Temp 98.2°F | Ht 71.0 in | Wt 197.0 lb

## 2022-09-23 DIAGNOSIS — E1151 Type 2 diabetes mellitus with diabetic peripheral angiopathy without gangrene: Secondary | ICD-10-CM | POA: Diagnosis not present

## 2022-09-23 DIAGNOSIS — Z7984 Long term (current) use of oral hypoglycemic drugs: Secondary | ICD-10-CM | POA: Diagnosis not present

## 2022-09-23 DIAGNOSIS — Z87891 Personal history of nicotine dependence: Secondary | ICD-10-CM

## 2022-09-23 DIAGNOSIS — I10 Essential (primary) hypertension: Secondary | ICD-10-CM

## 2022-09-23 DIAGNOSIS — Z23 Encounter for immunization: Secondary | ICD-10-CM | POA: Diagnosis not present

## 2022-09-23 DIAGNOSIS — D500 Iron deficiency anemia secondary to blood loss (chronic): Secondary | ICD-10-CM

## 2022-09-23 LAB — POCT GLYCOSYLATED HEMOGLOBIN (HGB A1C): Hemoglobin A1C: 6.7 % — AB (ref 4.0–5.6)

## 2022-09-23 LAB — GLUCOSE, CAPILLARY: Glucose-Capillary: 117 mg/dL — ABNORMAL HIGH (ref 70–99)

## 2022-09-23 MED ORDER — SYNJARDY 5-1000 MG PO TABS: 1.0000 | ORAL_TABLET | Freq: Two times a day (BID) | ORAL | 3 refills | Status: DC

## 2022-09-23 NOTE — Progress Notes (Signed)
Established Patient Office Visit  Subjective   Patient ID: Jason Velasquez, male    DOB: 15-Oct-1955  Age: 66 y.o. MRN: [id number]  Chief Complaint  Patient presents with   Check-up Visit   He is feeling well today he is taking all of his medications as directed.  He is no main concerns.  He is willing to get a flu shot today however he thought he got 1 in June and I discussed with him that this was actually the pneumonia vaccine and he is up-to-date in that regard.  He last saw Dr. Oval Linsey several months ago and was doing well.    Objective:     BP 104/74 (BP Location: Right Arm, Patient Position: Sitting, Cuff Size: Normal)   Pulse 75   Temp 98.2 F (36.8 C) (Oral)   Ht _0  (1.803 m)   Wt 197 lb (89.4 kg)   SpO2 99%   BMI 27.48 kg/m  BP Readings from Last 3 Encounters:  09/23/22 104/74  03/18/22 110/68  03/18/22 114/75   Wt Readings from Last 3 Encounters:  09/23/22 197 lb (89.4 kg)  03/18/22 201 lb (91.2 kg)  03/18/22 198 lb 9.6 oz (90.1 kg)      Physical Exam   Results for orders placed or performed in visit on 09/23/22  POC Hbg A1C  Result Value Ref Range   Hemoglobin A1C 6.7 (A) 4.0 - 5.6 %   HbA1c POC (<> result, manual entry)     HbA1c, POC (prediabetic range)     HbA1c, POC (controlled diabetic range)    Results for orders placed or performed in visit on 09/23/22  Glucose, capillary  Result Value Ref Range   Glucose-Capillary 117 (H) 70 - 99 mg/dL    Last CBC Lab Results  Component Value Date   WBC 4.8 05/07/2019   HGB 13.1 05/07/2019   HCT 41.0 05/07/2019   MCV 81.5 05/07/2019   MCH 22.9 (L) 09/21/2018   RDW 19.0 (H) 05/07/2019   PLT 225.0 58/30/9407   Last metabolic panel Lab Results  Component Value Date   GLUCOSE 106 (H) 09/10/2021   NA 141 09/10/2021   K 4.5 09/10/2021   CL 104 09/10/2021   CO2 22 09/10/2021   BUN 12 09/10/2021   CREATININE 1.20 09/10/2021   EGFR 67 09/10/2021   CALCIUM 9.6 09/10/2021   PHOS 4.2 12/07/2014    PROT 7.0 09/10/2021   ALBUMIN 4.3 09/10/2021   LABGLOB 2.7 09/10/2021   AGRATIO 1.6 09/10/2021   BILITOT 0.2 09/10/2021   ALKPHOS 75 09/10/2021   AST 18 09/10/2021   ALT 21 09/10/2021   ANIONGAP 7 01/26/2018   Last lipids Lab Results  Component Value Date   CHOL 94 (L) 09/10/2021   HDL 18 (L) 09/10/2021   LDLCALC 34 09/10/2021   TRIG 275 (H) 09/10/2021   CHOLHDL 5.2 (H) 09/10/2021   Last hemoglobin A1c Lab Results  Component Value Date   HGBA1C 6.7 (A) 09/23/2022      The ASCVD Risk score (Arnett DK, et al., 2019) failed to calculate for the following reasons:   The valid HDL cholesterol range is 20 to 100 mg/dL   The valid total cholesterol range is 130 to 320 mg/dL    Assessment & Plan:   Problem List Items Addressed This Visit       Cardiovascular and Mediastinum   Essential hypertension (Chronic)    Blood pressure at goal continuing lisinopril and Toprol.  DM (diabetes mellitus), type 2 with peripheral vascular complications (HCC) - Primary (Chronic)    Continues to have excellent A1c control we will continue Synjardy.      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      Other Visit Diagnoses     Type II diabetes mellitus with peripheral circulatory disorder (HCC)       Relevant Medications   Empagliflozin-metFORMIN HCl (SYNJARDY) 02-999 MG TABS   Need for immunization against influenza       Relevant Orders   Flu Vaccine QUAD High Dose(Fluad) (Completed)       No follow-ups on file.    [name], DO

## 2022-09-23 NOTE — Assessment & Plan Note (Signed)
Blood pressure at goal continuing lisinopril and Toprol.

## 2022-09-23 NOTE — Assessment & Plan Note (Signed)
Recheck CBC and iron studies today.

## 2022-09-23 NOTE — Assessment & Plan Note (Signed)
Continues to have excellent A1c control we will continue Synjardy.

## 2022-09-24 LAB — BMP8+ANION GAP
Anion Gap: 14 mmol/L (ref 10.0–18.0)
BUN/Creatinine Ratio: 14 (ref 10–24)
BUN: 15 mg/dL (ref 8–27)
CO2: 21 mmol/L (ref 20–29)
Calcium: 9.1 mg/dL (ref 8.6–10.2)
Chloride: 103 mmol/L (ref 96–106)
Creatinine, Ser: 1.07 mg/dL (ref 0.76–1.27)
Glucose: 103 mg/dL — ABNORMAL HIGH (ref 70–99)
Potassium: 4.6 mmol/L (ref 3.5–5.2)
Sodium: 138 mmol/L (ref 134–144)
eGFR: 76 mL/min/{1.73_m2} (ref >59)

## 2022-09-24 LAB — IRON,TIBC AND FERRITIN PANEL
Ferritin: 23 ng/mL — ABNORMAL LOW (ref 30–400)
Iron Saturation: 11 % — ABNORMAL LOW (ref 15–55)
Iron: 40 ug/dL (ref 38–169)
Total Iron Binding Capacity: 360 ug/dL (ref 250–450)
UIBC: 320 ug/dL (ref 111–343)

## 2022-09-24 LAB — CBC
Hematocrit: 39.7 % (ref 37.5–51.0)
Hemoglobin: 12.9 g/dL — ABNORMAL LOW (ref 13.0–17.7)
MCH: 26.2 pg — ABNORMAL LOW (ref 26.6–33.0)
MCHC: 32.5 g/dL (ref 31.5–35.7)
MCV: 81 fL (ref 79–97)
Platelets: 211 10*3/uL (ref 150–450)
RBC: 4.93 x10E6/uL (ref 4.14–5.80)
RDW: 15.8 % — ABNORMAL HIGH (ref 11.6–15.4)
WBC: 4 10*3/uL (ref 3.4–10.8)

## 2022-09-24 LAB — LIPID PANEL
Chol/HDL Ratio: 5.3 ratio — ABNORMAL HIGH (ref 0.0–5.0)
Cholesterol, Total: 96 mg/dL — ABNORMAL LOW (ref 100–199)
HDL: 18 mg/dL — ABNORMAL LOW (ref >39)
LDL Chol Calc (NIH): 27 mg/dL (ref 0–99)
Triglycerides: 353 mg/dL — ABNORMAL HIGH (ref 0–149)
VLDL Cholesterol Cal: 51 mg/dL — ABNORMAL HIGH (ref 5–40)

## 2022-11-11 ENCOUNTER — Telehealth: Payer: Self-pay

## 2022-11-11 NOTE — Telephone Encounter (Signed)
Prior Authorization for patient (Trulicity) came through via fax from patients insurance- [employer], Prior Authorization has been approved 11/11/2022-11/12/2023.

## 2022-11-29 ENCOUNTER — Telehealth: Payer: Self-pay | Admitting: Internal Medicine

## 2022-11-29 ENCOUNTER — Other Ambulatory Visit (HOSPITAL_COMMUNITY): Payer: Self-pay

## 2022-11-29 MED ORDER — TRULICITY 1.5 MG/0.5ML ~~LOC~~ SOAJ
1.5000 mg | SUBCUTANEOUS | 5 refills | Status: DC
  Filled 2022-11-29: qty 2, 28d supply, fill #0

## 2022-11-29 NOTE — Telephone Encounter (Signed)
I sent a refill of the medication to the [facility]. They have done a much better job of keeping medications like Trulicity in stock.  If you could call him and let him know the perscription has been sent.

## 2022-11-29 NOTE — Telephone Encounter (Signed)
Pt called/Informed Trulicity rx sent to [facility].

## 2022-11-29 NOTE — Telephone Encounter (Signed)
Pt calling to report the following medication per his pharmacy is on back order. Pt is requesting someone to call him back because he states his medication was approved but his can't get it..  Dulaglutide (TRULICITY) 1.5 0000000 [place] ([place]), [place] - [address] (Ph: [phone])

## 2022-11-29 NOTE — Telephone Encounter (Signed)
I called [facility] is on back order at all [facility] pharmac[facility].  I called pt about the above; stated he will call [facility] near to where he lives.  Pt called back - [facility] stated also it's on back -order. Stated he's willing to try another medication if approved by Dr Heber [place].

## 2022-12-01 MED ORDER — SEMAGLUTIDE(0.25 OR 0.5MG/DOS) 2 MG/3ML ~~LOC~~ SOPN: 0.5000 mg | PEN_INJECTOR | SUBCUTANEOUS | 5 refills | Status: DC

## 2022-12-01 NOTE — Telephone Encounter (Signed)
Pt was called and informed of new rx for Ozempic instead of Trulicity; rx sent to [facility].

## 2022-12-01 NOTE — Addendum Note (Signed)
Addended by: [name] on: 12/01/2022 02:27 PM   Modules accepted: Orders

## 2022-12-01 NOTE — Telephone Encounter (Signed)
Sent in new rx for ozempic to replace Trulicity if covered by insurance and available at pharmacy

## 2022-12-02 ENCOUNTER — Telehealth: Payer: Self-pay

## 2022-12-02 NOTE — Telephone Encounter (Signed)
Prior Authorization for patient (ozempic) came through on cover my meds was submitted with last office notes and labs awaiting approval or denial

## 2022-12-02 NOTE — Telephone Encounter (Signed)
Decision:Approved Jason Velasquez (Key: [id number]) Rx #: [phone] Ozempic (0.25 or 0.5 MG/DOSE) '2MG'$ [place] Mediate pen-injectors Form [facility] [employer] Electronic Prior Authorization Form 2017 NCPDP Created Message from Plan 29-FEB-24:01-MAR-25 Ozempic (0.25 or 0.5 MG/DOSE) '2MG'$ /3ML [place] SOPN Quantity:3;

## 2022-12-18 ENCOUNTER — Other Ambulatory Visit: Payer: Self-pay | Admitting: Cardiovascular Disease

## 2022-12-20 NOTE — Telephone Encounter (Signed)
Rx(s) sent to pharmacy electronically.  

## 2023-01-20 ENCOUNTER — Other Ambulatory Visit (HOSPITAL_BASED_OUTPATIENT_CLINIC_OR_DEPARTMENT_OTHER): Payer: Self-pay | Admitting: Cardiovascular Disease

## 2023-01-21 NOTE — Telephone Encounter (Signed)
Rx(s) sent to pharmacy electronically.  

## 2023-02-02 ENCOUNTER — Encounter (HOSPITAL_BASED_OUTPATIENT_CLINIC_OR_DEPARTMENT_OTHER): Payer: Self-pay | Admitting: Cardiovascular Disease

## 2023-02-02 ENCOUNTER — Ambulatory Visit (HOSPITAL_BASED_OUTPATIENT_CLINIC_OR_DEPARTMENT_OTHER): Payer: PPO | Admitting: Cardiovascular Disease

## 2023-02-02 VITALS — BP 116/68 | HR 68 | Ht 71.0 in | Wt 196.7 lb

## 2023-02-02 DIAGNOSIS — E785 Hyperlipidemia, unspecified: Secondary | ICD-10-CM

## 2023-02-02 DIAGNOSIS — I10 Essential (primary) hypertension: Secondary | ICD-10-CM

## 2023-02-02 DIAGNOSIS — Z951 Presence of aortocoronary bypass graft: Secondary | ICD-10-CM | POA: Diagnosis not present

## 2023-02-02 DIAGNOSIS — Z5181 Encounter for therapeutic drug level monitoring: Secondary | ICD-10-CM | POA: Diagnosis not present

## 2023-02-02 DIAGNOSIS — I739 Peripheral vascular disease, unspecified: Secondary | ICD-10-CM | POA: Diagnosis not present

## 2023-02-02 LAB — COMPREHENSIVE METABOLIC PANEL
ALT: 30 IU/L (ref 0–44)
AST: 21 IU/L (ref 0–40)
Albumin/Globulin Ratio: 1.9 (ref 1.2–2.2)
Albumin: 4.5 g/dL (ref 3.9–4.9)
Alkaline Phosphatase: 62 IU/L (ref 44–121)
BUN/Creatinine Ratio: 7 — ABNORMAL LOW (ref 10–24)
BUN: 8 mg/dL (ref 8–27)
Bilirubin Total: 0.4 mg/dL (ref 0.0–1.2)
CO2: 21 mmol/L (ref 20–29)
Calcium: 9.5 mg/dL (ref 8.6–10.2)
Chloride: 103 mmol/L (ref 96–106)
Creatinine, Ser: 1.07 mg/dL (ref 0.76–1.27)
Globulin, Total: 2.4 g/dL (ref 1.5–4.5)
Glucose: 140 mg/dL — ABNORMAL HIGH (ref 70–99)
Potassium: 4.5 mmol/L (ref 3.5–5.2)
Sodium: 139 mmol/L (ref 134–144)
Total Protein: 6.9 g/dL (ref 6.0–8.5)
eGFR: 76 mL/min/{1.73_m2} (ref >59)

## 2023-02-02 LAB — LIPID PANEL
Chol/HDL Ratio: 3.6 ratio (ref 0.0–5.0)
Cholesterol, Total: 87 mg/dL — ABNORMAL LOW (ref 100–199)
HDL: 24 mg/dL — ABNORMAL LOW (ref >39)
LDL Chol Calc (NIH): 44 mg/dL (ref 0–99)
Triglycerides: 94 mg/dL (ref 0–149)
VLDL Cholesterol Cal: 19 mg/dL (ref 5–40)

## 2023-02-02 NOTE — Progress Notes (Signed)
Cardiology Office Note  Date:  02/02/2023   ID:  Jason Velasquez, DOB 05-26-1956, MRN [id number]  PCP:  Gust Rung, DO  Cardiologist:   Isabelle Course   No chief complaint on file.    Patient ID: Jason Velasquez is a 67 y.o. male with CAD s/p CABG ( LIMA-->LAD, SVG-->PDA, SVG-->OM1), multiple PCIs , chronic stable angina, PAD, hypertension, diabetes and CVA who presents for follow up.  Jason Velasquez was first evaluated for chest pain on 07/27/15.  He previously underwent CABG in 2007. He had a nuclear stress test on 08/21/15 revealed LVEF 57% with moderate ischemia in the inferoseptal, inferior, and anterior locations. He subsequently underwent left heart catheterization on 08/26/15 that revealed severe three vessel disease including a 90% left main lesion and an occluded RCA and LAD. His LIMA to the LAD was patent but all other vein grafts were occluded. There were no optimal PCI targets.  He was started on carvedilol and Ranexa. Jason Velasquez also underwent ABI testing 08/2015 that revealed diffuse atherosclerosis from the proximal abdominal aorta through the common and external iliac arteries bilaterally.  There was no detectable flow in the L peroneal or anterior tibial arteries.  ABIs were near 0.8 bilaterally.  He was referred to Dr. Allyson Sabal where medical management was advised. He was noted to have carotid bruits and was referred for carotid ultrasound on 12/09/15.  This revealed 40-59% stenosis on the right and 1-39% stenosis on the left.  He had repeat LE Dopplers and ABIs 03/2017 that were unchanged from prior.  Medical management was recommended.    Jason Velasquez continued to have worsening angina despite optimal medical therapy.  He was referred for [facility]  05/2017 where he was found to have progression of the distal LM/LAD/LCx lesion.  He underwent DES placement in the ostial LCx, mid LCx, and ostial LAD.  The ostial LAD was treated with a cutting ballpon reducing the stenosis from 85% to 30%.  It was  recommended that he continue clopidogrel and aspirin indefinitely.  He had an echo 06/01/17 that showed LVEF 65-70% with grade 1 diastolic dysfunction.  He followed up on 07/2017 and continued to have angina especially at night.  There is some concern that it may also be GERD.  Therefore he was given a trial of pantoprazole and Imdur 60 mg daily was restarted.  He called our office 11/7 with progressive symptoms.  He was referred for cardiac catheterization but was found to be anemic with a hemoglobin of 7.7.  He was transfused and underwent upper endoscopy on 11/9.  He was found to have a hiatal hernia and 2 AVMs that were treated with APC.  He also received IV iron.  He subsequently underwent left heart catheterization that again showed severe three-vessel disease not amenable to PCI.  He continues to have refractory angina despite optimal medical therapy.  He had another left heart catheterization 01/25/2018 at which time his ostial LAD was found to be 99% stenosed.  A Sierra DES was successfully implanted.  He had occasional chest pain and ranolazine was increased.  Since that time he denies recurrent angina.  Lipids improved significantly Vascepa.    Today, he reports episodes of chest twinges, which he attributes to not being able to take his medications. He reports trouble getting his medications from the pharmacy due to them being on back order at [facility]. After speaking with the pharmacist, he was able to get his medications. He notes these episodes have since improved.  He has been monitoring his blood pressure at home and reports readings averaging around 114/74. He has not been exercising as much as he would like, but is making an effort to stay active. He has not gotten a bike yet, but hopes to soon get an e-bike. He denies any palpitations, shortness of breath, or peripheral edema. No lightheadedness, headaches, syncope, orthopnea, or PND.  Past Medical History:  Diagnosis Date   Angina, class III  (HCC) 08/24/2015   Arthritis    "hands" (01/25/2018)   AVM (arteriovenous malformation) 08/15/2017   CAD (coronary artery disease) of artery bypass graft    CABG- 2007    Carotid stenosis 03/03/2016   40-59% RICA, 1-39% LICA, >50% RECA stenosis Repeat 12/2016.   Claudication [facility])    Controlled type 2 diabetes mellitus without complication (HCC) 12/07/2014   Dx March of 2016. GAD65 negative Anti Islet cell negative     DKA (diabetic ketoacidoses) 12/07/2014   Dyspnea    History of blood transfusion ~ 08/2017   "related to LGIB"   History of hiatal hernia    Hx of adenomatous colonic polyps 05/27/2015   Hypercholesterolemia    Hypertension    Hypertensive heart disease 03/03/2016   Iron deficiency anemia due to chronic blood loss suspect from AVMs on Plavix    Left nephrolithiasis 12/07/2014   Migraine    "only in my 30's" (01/25/2018)   Non-ST elevation (NSTEMI) myocardial infarction [facility])    Pneumonia ~ 1990   Stroke (HCC) 07/12/2014   "I didn't realize I'd had one; hospital found it"; denies residual on 01/25/2018   Tubular adenoma of colon 05/31/2015   Colonoscopy 05/20/15 with Dr Leone Payor: 3 tubullar adenoma's largest 2.3cm.  Plan for repeat Colonoscopy in 2019.    Past Surgical History:  Procedure Laterality Date   CARDIAC CATHETERIZATION N/A 08/26/2015   Procedure: Left Heart Cath and Cors/Grafts Angiography;  Surgeon: Marykay Lex, MD;  Location: [facility] INVASIVE CV LAB;  Service: Cardiovascular;  Laterality: N/A;   CORONARY ARTERY BYPASS GRAFT  2007   "CABG X3", at [facility]    CORONARY BALLOON ANGIOPLASTY N/A 05/25/2017   Procedure: CORONARY BALLOON ANGIOPLASTY;  Surgeon: Marykay Lex, MD;  Location: [facility] INVASIVE CV LAB;  Service: Cardiovascular;  Laterality: N/A;   CORONARY BALLOON ANGIOPLASTY N/A 01/25/2018   Procedure: CORONARY BALLOON ANGIOPLASTY;  Surgeon: Marykay Lex, MD;  Location: [facility] INVASIVE CV LAB;  Service: Cardiovascular;  Laterality: N/A;   CORONARY  STENT INTERVENTION N/A 05/25/2017   Procedure: CORONARY STENT INTERVENTION;  Surgeon: Marykay Lex, MD;  Location: [facility] INVASIVE CV LAB;  Service: Cardiovascular;  Laterality: N/A;   CORONARY STENT INTERVENTION N/A 01/25/2018   Procedure: CORONARY STENT INTERVENTION;  Surgeon: Marykay Lex, MD;  Location: [facility] INVASIVE CV LAB;  Service: Cardiovascular;  Laterality: N/A;   ESOPHAGOGASTRODUODENOSCOPY N/A 08/12/2017   Procedure: ESOPHAGOGASTRODUODENOSCOPY (EGD);  Surgeon: Beverley Fiedler, MD;  Location: [facility] ENDOSCOPY;  Service: Gastroenterology;  Laterality: N/A;   LEFT HEART CATH AND CORS/GRAFTS ANGIOGRAPHY N/A 05/23/2017   Procedure: LEFT HEART CATH AND CORS/GRAFTS ANGIOGRAPHY;  Surgeon: Marykay Lex, MD;  Location: [facility] INVASIVE CV LAB;  Service: Cardiovascular;  Laterality: N/A;   LEFT HEART CATH AND CORS/GRAFTS ANGIOGRAPHY N/A 08/15/2017   Procedure: LEFT HEART CATH AND CORS/GRAFTS ANGIOGRAPHY;  Surgeon: Kathleene Hazel, MD;  Location: [facility] INVASIVE CV LAB;  Service: Cardiovascular;  Laterality: N/A;   LEFT HEART CATH AND CORS/GRAFTS ANGIOGRAPHY N/A 01/25/2018   Procedure: LEFT HEART  CATH AND CORS/GRAFTS ANGIOGRAPHY;  Surgeon: Marykay Lex, MD;  Location: [facility] INVASIVE CV LAB;  Service: Cardiovascular;  Laterality: N/A;    Current Outpatient Medications  Medication Sig Dispense Refill   acetaminophen (TYLENOL) 500 MG tablet Take 1,000 mg by mouth every 6 (six) hours as needed for moderate pain or headache.     aspirin EC 81 MG tablet Take 81 mg by mouth daily.     atorvastatin (LIPITOR) 80 MG tablet Take 1 tablet (80 mg total) by mouth every evening. 90 tablet 3   clopidogrel (PLAVIX) 75 MG tablet Take 1 tablet by mouth once daily 90 tablet 1   Empagliflozin-metFORMIN HCl (SYNJARDY) 02-999 MG TABS Take 1 tablet by mouth 2 (two) times daily with a meal. 180 tablet 3   ferrous sulfate 325 (65 FE) MG tablet Take 1 tablet (325 mg total) by mouth every other day. 100 tablet 1   glucose blood  (ONETOUCH VERIO) test strip Use as instructed 100 each 11   Hypromellose (ARTIFICIAL TEARS OP) Place 1 drop into both eyes daily as needed (for dry eyes).      icosapent Ethyl (VASCEPA) 1 g capsule Take 2 capsules (2 g total) by mouth 2 (two) times daily. 360 capsule 3   isosorbide mononitrate (IMDUR) 120 MG 24 hr tablet TAKE 1/2 (ONE-HALF) TABLET BY MOUTH IN THE MORNING AND 1 TABLET BY MOUTH IN THE EVENING 135 tablet 3   Lancets (ONETOUCH DELICA PLUS LANCET33G) MISC 1 Device by Does not apply route in the morning and at bedtime. 100 each 11   lisinopril (ZESTRIL) 10 MG tablet Take 1 tablet (10 mg total) by mouth daily. 90 tablet 1   metoprolol (TOPROL-XL) 200 MG 24 hr tablet Take 1 tablet (200 mg total) by mouth daily. 90 tablet 3   nitroGLYCERIN (NITROSTAT) 0.4 MG SL tablet Place 1 tablet (0.4 mg total) under the tongue every 5 (five) minutes as needed for chest pain. 25 tablet 2   ranolazine (RANEXA) 1000 MG SR tablet TAKE ONE TABLET BY MOUTH TWICE A DAY; NEW DOSE BY DOCTOR Birdell Frasier [place] ! 180 tablet 2   Semaglutide,0.25 or 0.5MG /DOS, 2 MG/3ML SOPN Inject 0.5 mg into the skin once a week. 3 mL 5   No current facility-administered medications for this visit.    Allergies:   Patient has no known allergies.    Social History:  The patient  reports that he quit smoking about 13 years ago. His smoking use included cigarettes. He has a 76.00 pack-year smoking history. He has never used smokeless tobacco. He reports that he does not currently use alcohol. He reports that he does not use drugs.   Family History:  The patient's family history includes Crohn's disease in his daughter; Diabetes in his father; Hypertension in his mother.    ROS:   Please see the history of present illness. (+) chest pain (twinges) All other systems are reviewed and negative.    PHYSICAL EXAM: VS:  BP 116/68 (BP Location: Right Arm, Patient Position: Sitting)   Pulse 68   Ht 5\' 11"  (1.803 m)   Wt 196 lb 11.2  oz (89.2 kg)   SpO2 100%   BMI 27.43 kg/m  , BMI Body mass index is 27.43 kg/m. GENERAL:  Well appearing HEENT: Pupils equal round and reactive, fundi not visualized, oral mucosa unremarkable NECK:  No jugular venous distention, waveform within normal limits, carotid upstroke brisk and symmetric, no bruits, no thyromegaly LUNGS:  Clear to auscultation bilaterally  HEART:  RRR.  PMI not displaced or sustained,S1 and S2 within normal limits, no S3, no S4, no clicks, no rubs, no murmurs ABD:  Flat, positive bowel sounds normal in frequency in pitch, no bruits, no rebound, no guarding, no midline pulsatile mass, no hepatomegaly, no splenomegaly EXT:  2 plus radial pulses.  1+DP/TP bilaterally, no edema, no cyanosis no clubbing SKIN:  No rashes no nodules NEURO:  Cranial nerves II through XII grossly intact, motor grossly intact throughout PSYCH:  Cognitively intact, oriented to person place and time  EKG:  EKG is personally reviewed.  02/02/2023: EKG was not ordered. 03/18/2022 EKG: Rate 85. Sinus Rhythm. 09/10/2021: EKG was not ordered. 01/30/2020: Sinus rhythm.  Rate 79 bpm.  Prior inferior infarct. 07/30/19: Sinus rhythm.  Rate 71 bpm.  Prior inferior infarct 08/11/17: Sinus rhythm.  Rate 77 bpm.    07/18/17: Sinus rhythm.  Rate 71 bpm.  05/18/17: Sinus rhythm. Rate 87 bpm. B 1 PVCs. 12/29/16: Sinus rhythm.  Incomplete RBBB.  Prior inferior infarct. 07/02/16: Sinus rhythm rate 75 bpm.  Prior inferior infarct  12/02/2015: Sinus rhythm rate 82 bpm.  Prior inferior infarct.   ABI 02/09/2021: Bilateral ABIs appear essentially unchanged compared to prior study on  02/08/2020. Left TBIs appear decreased compared to prior study on  02/08/2020. Right TBIs appear increased compared to prior study on  02/08/2020.     Summary:  Right: Resting right ankle-brachial index indicates moderate right lower  extremity arterial disease. The right toe-brachial index is normal.   TBIs increased by .11.  Left:  Resting left ankle-brachial index indicates mild left lower  extremity arterial disease. The left toe-brachial index is abnormal.   TBIs decreased by .06.   Carotid Duplex 02/09/2021: Summary:  Right Carotid: Velocities in the right ICA are consistent with a 60-79%   stenosis. Non-hemodynamically significant plaque <50% noted  in the CCA. Stable RICA velocities.   Left Carotid: Velocities in the left ICA are consistent with a 1-39%  stenosis. Stable LICA velocities.   Vertebrals:  Bilateral vertebral arteries demonstrate antegrade flow.  Subclavians: Normal flow hemodynamics were seen in bilateral subclavian arteries.   LHC 01/25/2018: CULPRIT: Ost LAD to Prox LAD lesion is 99% stenosed. A drug-eluting stent was successfully placed using a STENT SIERRA 3.00 X 15 MM. Post-dilated to 3.3 mm Post intervention, there is a 0% residual stenosis. Prox LAD to Mid LAD lesion is 100% stenosed after several septal perforators & diagonal branches. Ost Cx to Prox Cx lesion is 55% stenosed after LAD stent -- PTCA only -- Balloon angioplasty was performed using a BALLOON EMERGE MR PUSH 1.20X12. Post intervention, there is a 35% residual stenosis. Non-stenotic Ost LM to Dist LM lesion previously treated with DES - patent. Ost 1st Mrg lesion is 80% stenosed. - Unable to cross lesion with balloon. Prox Cx to Mid Cx lesion previously treated with DES - patent after ostial ISR. Mid RCA lesion is 100% stenosed. Ost RCA to Prox RCA lesion is 70% stenosed. Known occluson of SVG-RPDA & SVG-OM LIMA and is normal in caliber. Prox Graft lesion is 20% stenosed. The graft exhibits minimal luminal irregularities. LV end diastolic pressure is moderately elevated. There is no aortic valve stenosis. Post intervention, there is a 0% residual stenosis.   Successful DES stent in the proximal and ostial LAD.  Unfortunately the stent did shift into the left main creating a somewhat culotte technique crossing the  circumflex.  I was able to wire the circumflex and balloon  the stent struts with a 1.2 mm balloon.  However guide support did not allow larger balloon to cross.   Unable to cross into the OM1 lesion due to tortuosity and multiple stent struts.   Plan: Based on how long the procedure was, with gentle hydration.We will monitor him overnight Jason Velasquez removal per protocol Continue aggressive risk factor modification and antianginal therapy.  LHC 08/15/17: Mid RCA lesion is 100% stenosed. Ost RCA to Prox RCA lesion is 70% stenosed. Ost 1st Mrg lesion is 60% stenosed. Previously placed Ost 1st Mrg to 1st Mrg stent (unknown type) is widely patent. Origin lesion is 100% stenosed. Origin lesion is 100% stenosed. Prox Graft lesion is 20% stenosed. Prox LAD to Mid LAD lesion is 100% stenosed. Previously placed Ost Cx to Prox Cx stent (unknown type) is widely patent. Previously placed Ost LM to Dist LM stent (unknown type) is widely patent. Previously placed Prox Cx to Mid Cx stent (unknown type) is widely patent. SVG graft was not visualized. SVG graft was not visualized. Post intervention, there is a -1% residual stenosis. LIMA graft was visualized by angiography and is normal in caliber. The graft exhibits minimal luminal irregularities. Post intervention, there is a -1% residual stenosis. Ost LAD to Prox LAD lesion is 99% stenosed. Post intervention, there is a -1% residual stenosis.   1. Severe triple vessel CAD s/p CABG with 1/3 patent bypass grafts.  2. The LAD is known to be chronically occluded in the mid segment. The mid and distal LAD fills from the patent LIMA graft. The ostial LAD is jailed by the stent extending from the left main into the Circumflex. The proximal LAD has a 99% stenosis following by an aneurysmal segment then a total occlusion.  3. The left main stent is patent 4. The ostial/proximal Circumflex stent is patent. The first OM stent is patent. There is a 60% ostial  stenosis in the first OM branch just prior to the stent. The SVG to the OM is known to be occluded. The mid Circumflex stent is patent.  5. The native RCA has a long 70% proximal stenosis and 100% mid chronic occlusion. The vein graft the to distal RCA is known to be occluded and was not injected.  6. Normal filling pressures   Recommendations: Continue medical management of CAD. No focal targets for PCI. The proximal LAD stenosis is severe but the majority of this territory is supplied by the LIMA graft and the LAD is jailed by the LM/Circ stent. I suspect that his anemia has contributed to his recent chest pain given his severe, diffuse CAD.     Echo 06/01/17: Study Conclusions   - Procedure narrative: Transthoracic echocardiography. Image   quality was poor. Intravenous contrast (Definity) was   administered. - Left ventricle: The cavity size was normal. There was mild focal   basal hypertrophy of the septum. Systolic function was vigorous.   The estimated ejection fraction was in the range of 65% to 70%.   Wall motion was normal; there were no regional wall motion   abnormalities. Doppler parameters are consistent with abnormal   left ventricular relaxation (grade 1 diastolic dysfunction). - Aortic valve: Trileaflet; mildly thickened, mildly calcified   leaflets. - Left atrium: The atrium was mildly dilated.    Lexiscan Cardiolite 08/21/15: Nuclear stress EF: 57%. The left ventricular ejection fraction is normal (55-65%). There was no ST segment deviation noted during stress. No T wave inversion was noted during stress. Defect 1: There is  a small defect of moderate severity present in the basal inferoseptal and basal inferior location. Defect 2: There is a defect present in the basal anterior, mid anterior and apical anterior location. Findings consistent with ischemia. This is a high risk study.   High risk stress nuclear study with two areas of ischemia and normal left  ventricular regional and global systolic function. The inferior area of ischemia is small. The anterior area of ischemia is extensive, but appears to be very mild. The septum is spared.  Carotid Doppler 12/11/15: 40-59% RICA, 1-39% LICA, >50% RECA stenosis  ABI 08/12/15: R 0.81, L 0.89 Diffuse atherosclerosis from the proximal abdominal aorta into both tibial arteries. Several areas of narrowing are noted in both lower extremities, although no focal significant stenosis is identified. One vessel run-off on the right, and three vessel run-off on the left.  ABI 02/03/17: R 0.81, L 0.91 Abnormal lower arterial Doppler study, with waveforms suggestive of inflow disease. The bilateral ABI's are stable and in the mild range. The right great toe pressure is normal. The left great toe pressure is abnormal   Recent Labs: 09/23/2022: BUN 15; Creatinine, Ser 1.07; Hemoglobin 12.9; Platelets 211; Potassium 4.6; Sodium 138    Lipid Panel    Component Value Date/Time   CHOL 96 (L) 09/23/2022 1154   TRIG 353 (H) 09/23/2022 1154   HDL 18 (L) 09/23/2022 1154   CHOLHDL 5.3 (H) 09/23/2022 1154   CHOLHDL 3.6 08/12/2017 0334   VLDL 31 08/12/2017 0334   LDLCALC 27 09/23/2022 1154      Wt Readings from Last 3 Encounters:  02/02/23 196 lb 11.2 oz (89.2 kg)  09/23/22 197 lb (89.4 kg)  03/18/22 201 lb (91.2 kg)      ASSESSMENT AND PLAN:  Essential hypertension Blood pressure is very well-controlled.  He is encouraged to work on increasing his exercise.  Continue lisinopril, metoprolol, and Imdur.  PVD (peripheral vascular disease) (HCC) Stable PAD.  No claudication symptoms.  Encouraged him to work on increasing exercise as above.  Continue aspirin, atorvastatin, and Vascepa.  Dyslipidemia He has mixed dyslipidemia.  LDL is very well-controlled.  Triglycerides remain elevated.  Continue working with PCP on diabetes.  Continue Vascepa and atorvastatin.  We will recheck fasting lipids and CMP  today.  S/P CABG (coronary artery bypass graft) Stable and he has no angina as long as he is getting his medication as prescribed.  Continue aspirin, atorvastatin, clopidogrel (lifelong) Vascepa, Imdur, metoprolol, and ranolazine.   Current medicines are reviewed at length with the patient today.  The patient does not have concerns regarding medicines.  The following changes have been made:  Increase ranolazine to 1000 mg bid.  Labs/ tests ordered today include:   Orders Placed This Encounter  Procedures   Lipid panel   Comprehensive metabolic panel    Disposition:    FU with Zamyra Allensworth C. Duke Salvia, MD in 1 year -Lab work (lipids)    I,Rachel Rivera,acting as a Neurosurgeon for [employer], MD.,have documented all relevant documentation on the behalf of Chilton Si, MD,as directed by  Chilton Si, MD while in the presence of Chilton Si, MD.  I, Bynum Mccullars C. Duke Salvia, MD have reviewed all documentation for this visit.  The documentation of the exam, diagnosis, procedures, and orders on 02/02/2023 are all accurate and complete.

## 2023-02-02 NOTE — Assessment & Plan Note (Signed)
Stable and he has no angina as long as he is getting his medication as prescribed.  Continue aspirin, atorvastatin, clopidogrel (lifelong) Vascepa, Imdur, metoprolol, and ranolazine.

## 2023-02-02 NOTE — Assessment & Plan Note (Signed)
Blood pressure is very well-controlled.  He is encouraged to work on increasing his exercise.  Continue lisinopril, metoprolol, and Imdur.

## 2023-02-02 NOTE — Patient Instructions (Signed)
Medication Instructions:  Your physician recommends that you continue on your current medications as directed. Please refer to the Current Medication list given to you today.  *If you need a refill on your cardiac medications before your next appointment, please call your pharmacy*  Lab Work: FASTING LP/CMET SOON   If you have labs (blood work) drawn today and your tests are completely normal, you will receive your results only by: MyChart Message (if you have MyChart) OR A paper copy in the mail If you have any lab test that is abnormal or we need to change your treatment, we will call you to review the results.  Testing/Procedures: NONE  Follow-Up: At [facility], you and your health needs are our priority.  As part of our continuing mission to provide you with exceptional heart care, we have created designated Provider Care Teams.  These Care Teams include your primary Cardiologist (physician) and Advanced Practice Providers (APPs -  Physician Assistants and Nurse Practitioners) who all work together to provide you with the care you need, when you need it.  We recommend signing up for the patient portal called "MyChart".  Sign up information is provided on this After Visit Summary.  MyChart is used to connect with patients for Virtual Visits (Telemedicine).  Patients are able to view lab/test results, encounter notes, upcoming appointments, etc.  Non-urgent messages can be sent to your provider as well.   To learn more about what you can do with MyChart, go to [email or web address].    Your next appointment:   12 month(s)  Provider:   Chilton Si, MD or Gillian Shields, NP

## 2023-02-02 NOTE — Assessment & Plan Note (Signed)
Stable PAD.  No claudication symptoms.  Encouraged him to work on increasing exercise as above.  Continue aspirin, atorvastatin, and Vascepa.

## 2023-02-02 NOTE — Assessment & Plan Note (Signed)
He has mixed dyslipidemia.  LDL is very well-controlled.  Triglycerides remain elevated.  Continue working with PCP on diabetes.  Continue Vascepa and atorvastatin.  We will recheck fasting lipids and CMP today.

## 2023-02-04 ENCOUNTER — Other Ambulatory Visit (HOSPITAL_COMMUNITY): Payer: Self-pay

## 2023-02-04 ENCOUNTER — Telehealth (HOSPITAL_BASED_OUTPATIENT_CLINIC_OR_DEPARTMENT_OTHER): Payer: Self-pay | Admitting: Cardiovascular Disease

## 2023-02-04 NOTE — Telephone Encounter (Signed)
Left message for patient to call and schedule follow up Carotid doppler ordered by Dr. Duke Salvia

## 2023-02-07 ENCOUNTER — Encounter: Payer: Self-pay | Admitting: *Deleted

## 2023-02-07 NOTE — Progress Notes (Signed)
[facility] Quality Team Note  Name: Jason Velasquez Date of Birth: 03/29/1956 MRN: [id number] Date: 02/07/2023  [facility] Quality Team has reviewed this patient's chart, please see recommendations below:  [facility] Quality Other; Pt has open gap for A1C.  Would it be possible to address at upcoming ov on 03/17/2023?

## 2023-03-14 ENCOUNTER — Telehealth (HOSPITAL_BASED_OUTPATIENT_CLINIC_OR_DEPARTMENT_OTHER): Payer: Self-pay

## 2023-03-14 ENCOUNTER — Ambulatory Visit (INDEPENDENT_AMBULATORY_CARE_PROVIDER_SITE_OTHER): Payer: PPO

## 2023-03-14 DIAGNOSIS — I6523 Occlusion and stenosis of bilateral carotid arteries: Secondary | ICD-10-CM | POA: Diagnosis not present

## 2023-03-14 NOTE — Telephone Encounter (Signed)
Received the following message via secure chat,   "Patient asymptomatic right bresuure 79/51 and left 109/64. right pressure 5 minutes later 86/49. patient reports multiple nose bleeds since December. "   Consulted with Gillian Shields, NP- patient can stop ASA and continue on plavix. Let us know if he has any lightheaded or dizziness.

## 2023-03-15 ENCOUNTER — Other Ambulatory Visit (HOSPITAL_BASED_OUTPATIENT_CLINIC_OR_DEPARTMENT_OTHER): Payer: Self-pay | Admitting: *Deleted

## 2023-03-15 DIAGNOSIS — I6523 Occlusion and stenosis of bilateral carotid arteries: Secondary | ICD-10-CM

## 2023-03-17 ENCOUNTER — Ambulatory Visit (INDEPENDENT_AMBULATORY_CARE_PROVIDER_SITE_OTHER): Payer: PPO | Admitting: Internal Medicine

## 2023-03-17 ENCOUNTER — Other Ambulatory Visit: Payer: Self-pay

## 2023-03-17 ENCOUNTER — Encounter: Payer: Self-pay | Admitting: Internal Medicine

## 2023-03-17 VITALS — BP 109/68 | HR 76 | Temp 98.1°F | Ht 71.0 in | Wt 192.4 lb

## 2023-03-17 DIAGNOSIS — J309 Allergic rhinitis, unspecified: Secondary | ICD-10-CM | POA: Diagnosis not present

## 2023-03-17 DIAGNOSIS — Z7985 Long-term (current) use of injectable non-insulin antidiabetic drugs: Secondary | ICD-10-CM | POA: Diagnosis not present

## 2023-03-17 DIAGNOSIS — Z7984 Long term (current) use of oral hypoglycemic drugs: Secondary | ICD-10-CM

## 2023-03-17 DIAGNOSIS — E1151 Type 2 diabetes mellitus with diabetic peripheral angiopathy without gangrene: Secondary | ICD-10-CM

## 2023-03-17 DIAGNOSIS — I10 Essential (primary) hypertension: Secondary | ICD-10-CM

## 2023-03-17 LAB — GLUCOSE, CAPILLARY: Glucose-Capillary: 108 mg/dL — ABNORMAL HIGH (ref 70–99)

## 2023-03-17 LAB — POCT GLYCOSYLATED HEMOGLOBIN (HGB A1C): Hemoglobin A1C: 6.6 % — AB (ref 4.0–5.6)

## 2023-03-18 NOTE — Progress Notes (Signed)
Established Patient Office Visit  Subjective   Patient ID: Jason Velasquez, male    DOB: 1955-12-09  Age: 67 y.o. MRN: [id number]  Chief Complaint  Patient presents with   Follow-up   Nosebleeds   Jason Velasquez presents today to follow-up type 2 diabetes, hypertension.  Overall he reports he has been doing very well.  Saw Dr. Duke Velasquez last month and notes that she plans to change his follow-ups to once yearly.  He has had no angina has been taking and affording all of his medications well.  He has been trying to gradually increase his exercise.  He does note that he has been blowing his nose from time to time he occasionally will have some slight bleeding on the left side of his nose after blowing his nose but this stopped very quickly and he is not very bothered by it.  He does not take any nasal inhaler inhalers I have discussed with him previously about taking an intranasal corticosteroid to reduce any rhinitis or congestion but he has not been interested.     Objective:     BP 109/68 (BP Location: Right Arm, Patient Position: Sitting, Cuff Size: Normal)   Pulse 76   Temp 98.1 F (36.7 C) (Oral)   Ht 5\' 11"  (1.803 m)   Wt 192 lb 6.4 oz (87.3 kg)   SpO2 100% Comment: RA  BMI 26.83 kg/m  BP Readings from Last 3 Encounters:  03/17/23 109/68  02/02/23 116/68  09/23/22 104/74   Wt Readings from Last 3 Encounters:  03/17/23 192 lb 6.4 oz (87.3 kg)  02/02/23 196 lb 11.2 oz (89.2 kg)  09/23/22 197 lb (89.4 kg)      Physical Exam Constitutional:      Appearance: Normal appearance.  HENT:     Nose: Mucosal edema present. No nasal deformity, septal deviation, congestion or rhinorrhea.     Right Nostril: No foreign body, epistaxis or septal hematoma.     Left Nostril: No foreign body, epistaxis or septal hematoma.     Right Turbinates: Enlarged.     Left Turbinates: Enlarged.     Right Sinus: No maxillary sinus tenderness or frontal sinus tenderness.     Left Sinus: No maxillary  sinus tenderness or frontal sinus tenderness.  Cardiovascular:     Rate and Rhythm: Normal rate and regular rhythm.  Pulmonary:     Effort: Pulmonary effort is normal.  Musculoskeletal:     Right lower leg: No edema.     Left lower leg: No edema.  Neurological:     Mental Status: He is alert.      Results for orders placed or performed in visit on 03/17/23  Glucose, capillary  Result Value Ref Range   Glucose-Capillary 108 (H) 70 - 99 mg/dL  POC Hbg N8G  Result Value Ref Range   Hemoglobin A1C 6.6 (A) 4.0 - 5.6 %   HbA1c POC (<> result, manual entry)     HbA1c, POC (prediabetic range)     HbA1c, POC (controlled diabetic range)      Last CBC Lab Results  Component Value Date   WBC 4.0 09/23/2022   HGB 12.9 (L) 09/23/2022   HCT 39.7 09/23/2022   MCV 81 09/23/2022   MCH 26.2 (L) 09/23/2022   RDW 15.8 (H) 09/23/2022   PLT 211 09/23/2022   Last metabolic panel Lab Results  Component Value Date   GLUCOSE 140 (H) 02/02/2023   NA 139 02/02/2023   K 4.5 02/02/2023  CL 103 02/02/2023   CO2 21 02/02/2023   BUN 8 02/02/2023   CREATININE 1.07 02/02/2023   EGFR 76 02/02/2023   CALCIUM 9.5 02/02/2023   PHOS 4.2 12/07/2014   PROT 6.9 02/02/2023   ALBUMIN 4.5 02/02/2023   LABGLOB 2.4 02/02/2023   AGRATIO 1.9 02/02/2023   BILITOT 0.4 02/02/2023   ALKPHOS 62 02/02/2023   AST 21 02/02/2023   ALT 30 02/02/2023   ANIONGAP 7 01/26/2018   Last vitamin D No results found for: "25OHVITD2", "25OHVITD3", "VD25OH"    The ASCVD Risk score (Arnett DK, et al., 2019) failed to calculate for the following reasons:   The patient has a prior MI or stroke diagnosis    Assessment & Plan:   Problem List Items Addressed This Visit       Cardiovascular and Mediastinum   Essential hypertension (Chronic)    Blood pressure control is excellent with Imdur, lisinopril metoprolol      DM (diabetes mellitus), type 2 with peripheral vascular complications (HCC) - Primary (Chronic)     A1c today remains below 7% discussed with him he is doing very well with his diabetic control and to keep up the good work.  He will remain on Synjardy and Ozempic 0.5 mg weekly      Relevant Orders   POC Hbg A1C (Completed)     Respiratory   Allergic rhinitis    I discussed again using intranasal corticosteroid like Flonase to reduce the frequency of which she may need to blow his nose he will think about it.       Return in about 3 months (around 06/17/2023).    Gust Rung, DO

## 2023-03-18 NOTE — Assessment & Plan Note (Signed)
Blood pressure control is excellent with Imdur, lisinopril metoprolol

## 2023-03-18 NOTE — Assessment & Plan Note (Signed)
A1c today remains below 7% discussed with him he is doing very well with his diabetic control and to keep up the good work.  He will remain on Synjardy and Ozempic 0.5 mg weekly

## 2023-03-18 NOTE — Assessment & Plan Note (Signed)
I discussed again using intranasal corticosteroid like Flonase to reduce the frequency of which she may need to blow his nose he will think about it.

## 2023-05-02 DIAGNOSIS — I700 Atherosclerosis of aorta: Secondary | ICD-10-CM | POA: Diagnosis not present

## 2023-05-02 DIAGNOSIS — I10 Essential (primary) hypertension: Secondary | ICD-10-CM | POA: Diagnosis not present

## 2023-05-02 DIAGNOSIS — I25119 Atherosclerotic heart disease of native coronary artery with unspecified angina pectoris: Secondary | ICD-10-CM | POA: Diagnosis not present

## 2023-05-02 DIAGNOSIS — E1136 Type 2 diabetes mellitus with diabetic cataract: Secondary | ICD-10-CM | POA: Diagnosis not present

## 2023-05-02 DIAGNOSIS — D509 Iron deficiency anemia, unspecified: Secondary | ICD-10-CM | POA: Diagnosis not present

## 2023-05-02 DIAGNOSIS — E785 Hyperlipidemia, unspecified: Secondary | ICD-10-CM | POA: Diagnosis not present

## 2023-05-02 DIAGNOSIS — E1169 Type 2 diabetes mellitus with other specified complication: Secondary | ICD-10-CM | POA: Diagnosis not present

## 2023-05-02 DIAGNOSIS — K219 Gastro-esophageal reflux disease without esophagitis: Secondary | ICD-10-CM | POA: Diagnosis not present

## 2023-05-02 DIAGNOSIS — Z7902 Long term (current) use of antithrombotics/antiplatelets: Secondary | ICD-10-CM | POA: Diagnosis not present

## 2023-05-02 DIAGNOSIS — E663 Overweight: Secondary | ICD-10-CM | POA: Diagnosis not present

## 2023-05-02 DIAGNOSIS — E1151 Type 2 diabetes mellitus with diabetic peripheral angiopathy without gangrene: Secondary | ICD-10-CM | POA: Diagnosis not present

## 2023-05-02 DIAGNOSIS — H04123 Dry eye syndrome of bilateral lacrimal glands: Secondary | ICD-10-CM | POA: Diagnosis not present

## 2023-05-10 ENCOUNTER — Other Ambulatory Visit: Payer: Self-pay

## 2023-05-10 MED ORDER — ONETOUCH VERIO VI STRP: ORAL_STRIP | 11 refills | Status: DC

## 2023-05-10 MED ORDER — METOPROLOL SUCCINATE ER 200 MG PO TB24: 200.0000 mg | ORAL_TABLET | Freq: Every day | ORAL | 3 refills | Status: DC

## 2023-05-12 ENCOUNTER — Telehealth: Payer: Self-pay | Admitting: Pharmacist

## 2023-05-12 MED ORDER — ATORVASTATIN CALCIUM 80 MG PO TABS: 80.0000 mg | ORAL_TABLET | Freq: Every evening | ORAL | 3 refills | Status: DC

## 2023-05-12 NOTE — Addendum Note (Signed)
Addended by: Gust Rung on: 05/12/2023 03:41 PM   Modules accepted: Orders

## 2023-05-12 NOTE — Telephone Encounter (Signed)
Patient contacted for follow-up of Medication Adherence Metric for [employer] patient.   Patient contacted due to adherence risk with use of Atorvastatin.  Patient reported frustration with med access at his local [facility].  He states that he has also had trouble with getting refills.    Request for PCP:  Please send new prescription for Atorvastatin 80mg  of appropriate.    Total time with patient call and documentation of interaction: 11 minutes.

## 2023-05-19 ENCOUNTER — Encounter: Payer: Self-pay | Admitting: Pharmacist

## 2023-05-19 NOTE — Progress Notes (Signed)
Reviewed chart for adherence. SUPD passed but non-adherent per Dr. Tiajuana Amass last fill 4/19 #90 - Contacted patient - attempted to facilitate refill request from PCP by routing telephone encounter.

## 2023-06-07 ENCOUNTER — Other Ambulatory Visit: Payer: Self-pay

## 2023-06-07 MED ORDER — SEMAGLUTIDE(0.25 OR 0.5MG/DOS) 2 MG/3ML ~~LOC~~ SOPN: 0.5000 mg | PEN_INJECTOR | SUBCUTANEOUS | 5 refills | Status: DC

## 2023-06-16 ENCOUNTER — Other Ambulatory Visit: Payer: Self-pay

## 2023-06-16 ENCOUNTER — Ambulatory Visit (INDEPENDENT_AMBULATORY_CARE_PROVIDER_SITE_OTHER): Payer: PPO

## 2023-06-16 ENCOUNTER — Ambulatory Visit (INDEPENDENT_AMBULATORY_CARE_PROVIDER_SITE_OTHER): Payer: PPO | Admitting: Internal Medicine

## 2023-06-16 ENCOUNTER — Encounter: Payer: Self-pay | Admitting: Internal Medicine

## 2023-06-16 VITALS — BP 116/69 | HR 77 | Temp 98.3°F | Wt 188.7 lb

## 2023-06-16 VITALS — BP 116/69 | HR 77 | Temp 98.3°F | Ht 71.0 in | Wt 188.7 lb

## 2023-06-16 DIAGNOSIS — Z23 Encounter for immunization: Secondary | ICD-10-CM | POA: Diagnosis not present

## 2023-06-16 DIAGNOSIS — E1151 Type 2 diabetes mellitus with diabetic peripheral angiopathy without gangrene: Secondary | ICD-10-CM

## 2023-06-16 DIAGNOSIS — Z0000 Encounter for general adult medical examination without abnormal findings: Secondary | ICD-10-CM | POA: Diagnosis not present

## 2023-06-16 DIAGNOSIS — I10 Essential (primary) hypertension: Secondary | ICD-10-CM | POA: Diagnosis not present

## 2023-06-16 LAB — GLUCOSE, CAPILLARY: Glucose-Capillary: 108 mg/dL — ABNORMAL HIGH (ref 70–99)

## 2023-06-16 LAB — POCT GLYCOSYLATED HEMOGLOBIN (HGB A1C): Hemoglobin A1C: 6.1 % — AB (ref 4.0–5.6)

## 2023-06-16 MED ORDER — SYNJARDY 5-1000 MG PO TABS: 1.0000 | ORAL_TABLET | Freq: Two times a day (BID) | ORAL | 3 refills | Status: DC

## 2023-06-16 MED ORDER — ONETOUCH DELICA PLUS LANCET33G MISC: 1.0000 | Freq: Two times a day (BID) | 11 refills | Status: DC

## 2023-06-16 NOTE — Progress Notes (Signed)
Established Patient Office Visit  Subjective   Patient ID: Jason Velasquez, male    DOB: [birth year]  Age: 67 y.o. MRN: [id number]  Chief Complaint  Patient presents with   3 month follow up   Jason Velasquez presents for 11-month follow-up of his diabetes.  He has no complaints he feels like everything has been going fairly well he has been taking care of his granddaughter more and been a little bit more active with that.  Not having any chest pain.  Some slight leg cramps with walking but overall better than it was previously.     Objective:     BP 116/69 (BP Location: Right Arm, Patient Position: Sitting, Cuff Size: Normal)   Pulse 77   Temp 98.3 F (36.8 C) (Oral)   Wt 188 lb 11.2 oz (85.6 kg)   SpO2 99%   BMI 26.32 kg/m  BP Readings from Last 3 Encounters:  06/16/23 116/69  06/16/23 116/69  03/17/23 109/68   Wt Readings from Last 3 Encounters:  06/16/23 188 lb 11.2 oz (85.6 kg)  06/16/23 188 lb 11.2 oz (85.6 kg)  03/17/23 192 lb 6.4 oz (87.3 kg)      Physical Exam Vitals and nursing note reviewed.  Constitutional:      Appearance: Normal appearance.  Pulmonary:     Effort: Pulmonary effort is normal.     Breath sounds: Normal breath sounds.  Neurological:     Mental Status: He is alert.  Psychiatric:        Mood and Affect: Mood normal.      Results for orders placed or performed in visit on 06/16/23  Microalbumin / Creatinine Urine Ratio  Result Value Ref Range   Creatinine, Urine 84.8 Not Estab. mg/dL   Microalbumin, Urine 4.5 Not Estab. ug/mL   Microalb/Creat Ratio 5 0 - 29 mg/g creat  Glucose, capillary  Result Value Ref Range   Glucose-Capillary 108 (H) 70 - 99 mg/dL  POC Hbg W1U  Result Value Ref Range   Hemoglobin A1C 6.1 (A) 4.0 - 5.6 %   HbA1c POC (<> result, manual entry)     HbA1c, POC (prediabetic range)     HbA1c, POC (controlled diabetic range)      Last CBC Lab Results  Component Value Date   WBC 4.0 09/23/2022   HGB 12.9 (L) 09/23/2022    HCT 39.7 09/23/2022   MCV 81 09/23/2022   MCH 26.2 (L) 09/23/2022   RDW 15.8 (H) 09/23/2022   PLT 211 09/23/2022   Last metabolic panel Lab Results  Component Value Date   GLUCOSE 140 (H) 02/02/2023   NA 139 02/02/2023   K 4.5 02/02/2023   CL 103 02/02/2023   CO2 21 02/02/2023   BUN 8 02/02/2023   CREATININE 1.07 02/02/2023   EGFR 76 02/02/2023   CALCIUM 9.5 02/02/2023   PHOS 4.2 12/07/2014   PROT 6.9 02/02/2023   ALBUMIN 4.5 02/02/2023   LABGLOB 2.4 02/02/2023   AGRATIO 1.9 02/02/2023   BILITOT 0.4 02/02/2023   ALKPHOS 62 02/02/2023   AST 21 02/02/2023   ALT 30 02/02/2023   ANIONGAP 7 01/26/2018   Last lipids Lab Results  Component Value Date   CHOL 87 (L) 02/02/2023   HDL 24 (L) 02/02/2023   LDLCALC 44 02/02/2023   TRIG 94 02/02/2023   CHOLHDL 3.6 02/02/2023   Last hemoglobin A1c Lab Results  Component Value Date   HGBA1C 6.1 (A) 06/16/2023      The ASCVD Risk  score (Arnett DK, et al., 2019) failed to calculate for the following reasons:   The patient has a prior MI or stroke diagnosis    Assessment & Plan:   Problem List Items Addressed This Visit       Cardiovascular and Mediastinum   Essential hypertension (Chronic)    Excellent control of hypertension continue Imdur lisinopril metoprolol.      DM (diabetes mellitus), type 2 with peripheral vascular complications (HCC) - Primary (Chronic)    He is doing very well with his diabetes we will continue Synjardy.  I will see him again in 6 months.      Relevant Medications   Empagliflozin-metFORMIN HCl (SYNJARDY) 02-999 MG TABS   Other Relevant Orders   Microalbumin / Creatinine Urine Ratio (Completed)   POC Hbg A1C (Completed)   Other Visit Diagnoses     Type II diabetes mellitus with peripheral circulatory disorder (HCC)       Relevant Medications   Empagliflozin-metFORMIN HCl (SYNJARDY) 02-999 MG TABS   Encounter for immunization       Relevant Orders   Flu Vaccine Trivalent High Dose  (Fluad) (Completed)       Return in about 6 months (around 12/14/2023).    Gust Rung, DO

## 2023-06-16 NOTE — Progress Notes (Signed)
Subjective:   Jason Velasquez is a 67 y.o. male who presents for Medicare Annual/Subsequent preventive examination.  Visit Complete: In person  Review of Systems    Defer to PCP.        Objective:    Today's Vitals   06/16/23 1418 06/16/23 1420  BP: 116/69   Pulse: 77   Temp: 98.3 F (36.8 C)   TempSrc: Oral   SpO2: 99%   Weight: 188 lb 11.2 oz (85.6 kg)   PainSc:  0-No pain   Body mass index is 26.32 kg/m.     06/16/2023    2:28 PM 06/16/2023   10:51 AM 03/17/2023   10:41 AM 09/23/2022   11:10 AM 03/18/2022    8:45 AM 10/15/2021   10:45 AM 04/09/2021   11:14 AM  Advanced Directives  Does Patient Have a Medical Advance Directive? No No No No No No No  Would patient like information on creating a medical advance directive? No - Patient declined No - Patient declined No - Patient declined No - Patient declined No - Patient declined No - Patient declined No - Patient declined    Current Medications (verified) Outpatient Encounter Medications as of 06/16/2023  Medication Sig   acetaminophen (TYLENOL) 500 MG tablet Take 1,000 mg by mouth every 6 (six) hours as needed for moderate pain or headache.   atorvastatin (LIPITOR) 80 MG tablet Take 1 tablet (80 mg total) by mouth every evening.   clopidogrel (PLAVIX) 75 MG tablet Take 1 tablet by mouth once daily   Empagliflozin-metFORMIN HCl (SYNJARDY) 02-999 MG TABS Take 1 tablet by mouth 2 (two) times daily with a meal.   ferrous sulfate 325 (65 FE) MG tablet Take 1 tablet (325 mg total) by mouth every other day.   glucose blood (ONETOUCH VERIO) test strip Use as instructed   Hypromellose (ARTIFICIAL TEARS OP) Place 1 drop into both eyes daily as needed (for dry eyes).    icosapent Ethyl (VASCEPA) 1 g capsule Take 2 capsules (2 g total) by mouth 2 (two) times daily.   isosorbide mononitrate (IMDUR) 120 MG 24 hr tablet TAKE 1/2 (ONE-HALF) TABLET BY MOUTH IN THE MORNING AND 1 TABLET BY MOUTH IN THE EVENING   Lancets (ONETOUCH DELICA  PLUS LANCET33G) MISC 1 Device by Does not apply route in the morning and at bedtime.   lisinopril (ZESTRIL) 10 MG tablet Take 1 tablet (10 mg total) by mouth daily.   metoprolol (TOPROL-XL) 200 MG 24 hr tablet Take 1 tablet (200 mg total) by mouth daily.   nitroGLYCERIN (NITROSTAT) 0.4 MG SL tablet Place 1 tablet (0.4 mg total) under the tongue every 5 (five) minutes as needed for chest pain.   ranolazine (RANEXA) 1000 MG SR tablet TAKE ONE TABLET BY MOUTH TWICE A DAY; NEW DOSE BY DOCTOR TIFFANY Camarillo !   Semaglutide,0.25 or 0.5MG /DOS, 2 MG/3ML SOPN Inject 0.5 mg into the skin once a week.   No facility-administered encounter medications on file as of 06/16/2023.    Allergies (verified) Patient has no known allergies.   History: Past Medical History:  Diagnosis Date   Angina, class III (HCC) 08/24/2015   Arthritis    "hands" (01/25/2018)   AVM (arteriovenous malformation) 08/15/2017   CAD (coronary artery disease) of artery bypass graft    CABG- 2007    Carotid stenosis 03/03/2016   40-59% RICA, 1-39% LICA, >50% RECA stenosis Repeat 12/2016.   Claudication (HCC)    Controlled type 2 diabetes mellitus without complication (  HCC) 12/07/2014   Dx March of 2016. GAD65 negative Anti Islet cell negative     DKA (diabetic ketoacidoses) 12/07/2014   Dyspnea    History of blood transfusion ~ 08/2017   "related to LGIB"   History of hiatal hernia    Hx of adenomatous colonic polyps 05/27/2015   Hypercholesterolemia    Hypertension    Hypertensive heart disease 03/03/2016   Iron deficiency anemia due to chronic blood loss suspect from AVMs on Plavix    Left nephrolithiasis 12/07/2014   Migraine    "only in my 30's" (01/25/2018)   Non-ST elevation (NSTEMI) myocardial infarction [facility])    Pneumonia ~ 1990   Stroke (HCC) 07/12/2014   "I didn't realize I'd had one; hospital found it"; denies residual on 01/25/2018   Tubular adenoma of colon 05/31/2015   Colonoscopy 05/20/15 with Dr Leone Payor: 3 tubullar  adenoma's largest 2.3cm.  Plan for repeat Colonoscopy in 2019.   Past Surgical History:  Procedure Laterality Date   CARDIAC CATHETERIZATION N/A 08/26/2015   Procedure: Left Heart Cath and Cors/Grafts Angiography;  Surgeon: Marykay Lex, MD;  Location: [facility] INVASIVE CV LAB;  Service: Cardiovascular;  Laterality: N/A;   CORONARY ARTERY BYPASS GRAFT  2007   "CABG X3", at [facility]    CORONARY BALLOON ANGIOPLASTY N/A 05/25/2017   Procedure: CORONARY BALLOON ANGIOPLASTY;  Surgeon: Marykay Lex, MD;  Location: [facility] INVASIVE CV LAB;  Service: Cardiovascular;  Laterality: N/A;   CORONARY BALLOON ANGIOPLASTY N/A 01/25/2018   Procedure: CORONARY BALLOON ANGIOPLASTY;  Surgeon: Marykay Lex, MD;  Location: [facility] INVASIVE CV LAB;  Service: Cardiovascular;  Laterality: N/A;   CORONARY STENT INTERVENTION N/A 05/25/2017   Procedure: CORONARY STENT INTERVENTION;  Surgeon: Marykay Lex, MD;  Location: [facility] INVASIVE CV LAB;  Service: Cardiovascular;  Laterality: N/A;   CORONARY STENT INTERVENTION N/A 01/25/2018   Procedure: CORONARY STENT INTERVENTION;  Surgeon: Marykay Lex, MD;  Location: [facility] INVASIVE CV LAB;  Service: Cardiovascular;  Laterality: N/A;   ESOPHAGOGASTRODUODENOSCOPY N/A 08/12/2017   Procedure: ESOPHAGOGASTRODUODENOSCOPY (EGD);  Surgeon: Beverley Fiedler, MD;  Location: [facility] ENDOSCOPY;  Service: Gastroenterology;  Laterality: N/A;   LEFT HEART CATH AND CORS/GRAFTS ANGIOGRAPHY N/A 05/23/2017   Procedure: LEFT HEART CATH AND CORS/GRAFTS ANGIOGRAPHY;  Surgeon: Marykay Lex, MD;  Location: [facility] INVASIVE CV LAB;  Service: Cardiovascular;  Laterality: N/A;   LEFT HEART CATH AND CORS/GRAFTS ANGIOGRAPHY N/A 08/15/2017   Procedure: LEFT HEART CATH AND CORS/GRAFTS ANGIOGRAPHY;  Surgeon: Kathleene Hazel, MD;  Location: [facility] INVASIVE CV LAB;  Service: Cardiovascular;  Laterality: N/A;   LEFT HEART CATH AND CORS/GRAFTS ANGIOGRAPHY N/A 01/25/2018   Procedure: LEFT HEART CATH AND  CORS/GRAFTS ANGIOGRAPHY;  Surgeon: Marykay Lex, MD;  Location: [facility] INVASIVE CV LAB;  Service: Cardiovascular;  Laterality: N/A;   Family History  Problem Relation Age of Onset   Hypertension Mother    Diabetes Father    Crohn's disease Daughter    Colon cancer Neg Hx    Rectal cancer Neg Hx    Social History   Socioeconomic History   Marital status: Divorced    Spouse name: Not on file   Number of children: Not on file   Years of education: 13   Highest education level: Not on file  Occupational History   Occupation: "Retired on disability"  Tobacco Use   Smoking status: Former    Current packs/day: 0.00    Average packs/day: 2.0 packs/day for 38.0 years (76.0 ttl  pk-yrs)    Types: Cigarettes    Start date: 10/05/1971    Quit date: 10/04/2009    Years since quitting: 13.7   Smokeless tobacco: Never  Vaping Use   Vaping status: Never Used  Substance and Sexual Activity   Alcohol use: Not Currently    Comment: Rarely.   Drug use: No   Sexual activity: Not Currently  Other Topics Concern   Not on file  Social History Narrative   Divorced   2 children, 1 [place] 1 [place]   Former smoker rare EtOH no drugs   Epworth Sleepiness Scale = 7 (as of 07/28/2015)      Current Social History 08/07/2020       Patient lives alone in a home which is 1 story. There are 5 steps with handrail up to the entrance the patient uses.       Patient's method of transportation is personal car.      The highest level of education was some college      The patient currently, "retired on disability"      Identified important Relationships are: States he has people he can count on but did not wish to disclose list.       Pets : 1 indoor/outdoor Brewing technologist / Fun: "Being on the computer, watching TV, keeping the lawn, shrubberies looking nice."      Current Stressors: "None, learning to control any stress whatsoever."      Religious / Personal Beliefs: "I believe in God." ([place])       L. [place]atte, BSN, RN-BC          Social Determinants of Health   Financial Resource Strain: Low Risk  (06/16/2023)   Overall Financial Resource Strain (CARDIA)    Difficulty of Paying Living Expenses: Not hard at all  Food Insecurity: Food Insecurity Present (06/16/2023)   Hunger Vital Sign    Worried About Running Out of Food in the Last Year: Sometimes true    Ran Out of Food in the Last Year: Sometimes true  Transportation Needs: No Transportation Needs (06/16/2023)   PRAPARE - Administrator, Civil Service (Medical): No    Lack of Transportation (Non-Medical): No  Physical Activity: Insufficiently Active (06/16/2023)   Exercise Vital Sign    Days of Exercise per Week: 1 day    Minutes of Exercise per Session: 20 min  Stress: Stress Concern Present (06/16/2023)   [employer] of Occupational Health - Occupational Stress Questionnaire    Feeling of Stress : To some extent  Social Connections: Moderately Isolated (06/16/2023)   Social Connection and Isolation Panel [NHANES]    Frequency of Communication with Friends and Family: More than three times a week    Frequency of Social Gatherings with Friends and Family: More than three times a week    Attends Religious Services: 1 to 4 times per year    Active Member of [employer] or Organizations: No    Attends Engineer, structural: Never    Marital Status: Divorced    Tobacco Counseling Counseling given: Not Answered   Clinical Intake:  Pre-visit preparation completed: Yes  Pain : No/denies pain Pain Score: 0-No pain     Nutritional Status: BMI 25 -29 Overweight Nutritional Risks: None Diabetes: Yes CBG done?: Yes CBG resulted in Enter/ Edit results?: Yes Did pt. bring in CBG monitor from home?: Yes Glucose Meter Downloaded?: Yes  How often do you  need to have someone help you when you read instructions, pamphlets, or other written materials from your doctor or pharmacy?: 1 -  Never What is the last grade level you completed in school?: some college  Interpreter Needed?: No  Information entered by :: Donal Lynam, CMA 06/16/2023   Activities of Daily Living    06/16/2023    2:22 PM 06/16/2023   10:51 AM  In your present state of health, do you have any difficulty performing the following activities:  Hearing? 0 0  Vision? 1 1  Comment "Eyes become dry and blurry". Eyes become dry & blurry  Difficulty concentrating or making decisions? 0 0  Walking or climbing stairs? 0 0  Dressing or bathing? 0 0  Doing errands, shopping? 0 0    Patient Care Team: Gust Rung, DO as PCP - General (Internal Medicine) Chilton Si, MD as PCP - Cardiology (Cardiology) Iva Boop, MD as Consulting Physician (Gastroenterology) Chilton Si, MD as Attending Physician (Cardiology) Norva Pavlov, OD as Consulting Physician (Optometry)  Indicate any recent Medical Services you may have received from other than [facility] providers in the past year (date may be approximate).     Assessment:   This is a routine wellness examination for Homer.  Hearing/Vision screen No results found.   Goals Addressed   None   Depression Screen    06/16/2023    2:28 PM 06/16/2023   10:52 AM 03/17/2023   10:40 AM 09/23/2022   11:09 AM 03/18/2022    8:44 AM 10/15/2021   10:45 AM 04/09/2021   11:18 AM  PHQ 2/9 Scores  PHQ - 2 Score 0 0 0 0 0 0 0  PHQ- 9 Score       0    Fall Risk    06/16/2023    2:29 PM 06/16/2023   10:51 AM 03/17/2023   10:40 AM 09/23/2022   11:09 AM 03/18/2022    8:44 AM  Fall Risk   Falls in the past year? 0 0 0 0 0  Number falls in past yr: 0 0 0 0 0  Injury with Fall? 0 0 0 0 0  Risk for fall due to : No Fall Risks No Fall Risks No Fall Risks No Fall Risks No Fall Risks  Follow up Falls evaluation completed Falls evaluation completed;Falls prevention discussed Falls evaluation completed;Falls prevention discussed Falls evaluation completed;Falls  prevention discussed Falls prevention discussed;Falls evaluation completed    MEDICARE RISK AT HOME: Medicare Risk at Home Any stairs in or around the home?: No If so, are there any without handrails?: No Home free of loose throw rugs in walkways, pet beds, electrical cords, etc?: Yes Adequate lighting in your home to reduce risk of falls?: Yes Life alert?: No Use of a cane, walker or w/c?: No Grab bars in the bathroom?: No Shower chair or bench in shower?: No Elevated toilet seat or a handicapped toilet?: No  TIMED UP AND GO:  Was the test performed?  No    Cognitive Function:        Immunizations Immunization History  Administered Date(s) Administered   Fluad Quad(high Dose 65+) 10/15/2021, 09/23/2022   Fluad Trivalent(High Dose 65+) 06/16/2023   Influenza,inj,Quad PF,6+ Mos 09/21/2018, 07/12/2019, 09/18/2020   PNEUMOCOCCAL CONJUGATE-20 03/18/2022   Pneumococcal Polysaccharide-23 03/28/2015   Tdap 03/28/2015   Zoster Recombinant(Shingrix) 09/14/2022, 03/04/2023    TDAP status: Up to date  Flu Vaccine status: Up to date  Pneumococcal vaccine status: Up to date  Covid-19 vaccine status: Information provided on how to obtain vaccines.   Qualifies for Shingles Vaccine? Yes   Zostavax completed No   Shingrix Completed?: Yes  Screening Tests Health Maintenance  Topic Date Due   COVID-19 Vaccine (1) Never done   OPHTHALMOLOGY EXAM  11/19/2022   Diabetic kidney evaluation - Urine ACR  03/19/2023   Lung Cancer Screening  06/15/2024 (Originally 02/07/2005)   FOOT EXAM  09/24/2023   HEMOGLOBIN A1C  12/14/2023   Diabetic kidney evaluation - eGFR measurement  02/02/2024   Colonoscopy  03/18/2024   Medicare Annual Wellness (AWV)  06/15/2024   DTaP/Tdap/Td (2 - Td or Tdap) 03/27/2025   Pneumonia Vaccine 36+ Years old  Completed   INFLUENZA VACCINE  Completed   Hepatitis C Screening  Completed   Zoster Vaccines- Shingrix  Completed   HPV VACCINES  Aged Out     Health Maintenance  Health Maintenance Due  Topic Date Due   COVID-19 Vaccine (1) Never done   OPHTHALMOLOGY EXAM  11/19/2022   Diabetic kidney evaluation - Urine ACR  03/19/2023    Colorectal cancer screening: Type of screening: Colonoscopy. Completed 03/19/2019. Repeat every 5 years  Lung Cancer Screening: (Low Dose CT Chest recommended if Age 12-80 years, 20 pack-year currently smoking OR have quit w/in 15years.) does not qualify.   Lung Cancer Screening Referral: Defer to PCP.   Additional Screening:  Hepatitis C Screening: does qualify; Completed 07/10/2015.  Vision Screening: Recommended annual ophthalmology exams for early detection of glaucoma and other disorders of the eye. Is the patient up to date with their annual eye exam?  Yes  Who is the provider or what is the name of the office in which the patient attends annual eye exams? [facility] If pt is not established with a provider, would they like to be referred to a provider to establish care? No .   Dental Screening: Recommended annual dental exams for proper oral hygiene  Diabetic Foot Exam: Diabetic Foot Exam: Completed 09/23/2022  Community Resource Referral / Chronic Care Management: CRR required this visit?  No   CCM required this visit?  No     Plan:     I have personally reviewed and noted the following in the patient's chart:   Medical and social history Use of alcohol, tobacco or illicit drugs  Current medications and supplements including opioid prescriptions. Patient is not currently taking opioid prescriptions. Functional ability and status Nutritional status Physical activity Advanced directives List of other physicians Hospitalizations, surgeries, and ER visits in previous 12 months Vitals Screenings to include cognitive, depression, and falls Referrals and appointments  In addition, I have reviewed and discussed with patient certain preventive protocols, quality  metrics, and best practice recommendations. A written personalized care plan for preventive services as well as general preventive health recommendations were provided to patient.     Yumalay Circle, CMA   06/16/2023   After Visit Summary: (Mail) Due to this being a telephonic visit, the after visit summary with patients personalized plan was offered to patient via mail   Nurse Notes: Face to Face.  Mr. Poppleton , Thank you for taking time to come for your Medicare Wellness Visit. I appreciate your ongoing commitment to your health goals. Please review the following plan we discussed and let me know if I can assist you in the future.   These are the goals we discussed:  Goals       "Just to stay alive" (pt-stated)  Bicycle riding (pt-stated)      Patient is considering purchasing a bicycle for outdoor riding.      Blood Pressure < 130/80      HEMOGLOBIN A1C < 7.0        This is a list of the screening recommended for you and due dates:  Health Maintenance  Topic Date Due   COVID-19 Vaccine (1) Never done   Eye exam for diabetics  11/19/2022   Yearly kidney health urinalysis for diabetes  03/19/2023   Screening for Lung Cancer  06/15/2024*   Complete foot exam   09/24/2023   Hemoglobin A1C  12/14/2023   Yearly kidney function blood test for diabetes  02/02/2024   Colon Cancer Screening  03/18/2024   Medicare Annual Wellness Visit  06/15/2024   DTaP/Tdap/Td vaccine (2 - Td or Tdap) 03/27/2025   Pneumonia Vaccine  Completed   Flu Shot  Completed   Hepatitis C Screening  Completed   Zoster (Shingles) Vaccine  Completed   HPV Vaccine  Aged Out  *Topic was postponed. The date shown is not the original due date.

## 2023-06-16 NOTE — Patient Instructions (Signed)
Health Maintenance, Male Adopting a healthy lifestyle and getting preventive care are important in promoting health and wellness. Ask your health care provider about: The right schedule for you to have regular tests and exams. Things you can do on your own to prevent diseases and keep yourself healthy. What should I know about diet, weight, and exercise? Eat a healthy diet  Eat a diet that includes plenty of vegetables, fruits, low-fat dairy products, and lean protein. Do not eat a lot of foods that are high in solid fats, added sugars, or sodium. Maintain a healthy weight Body mass index (BMI) is a measurement that can be used to identify possible weight problems. It estimates body fat based on height and weight. Your health care provider can help determine your BMI and help you achieve or maintain a healthy weight. Get regular exercise Get regular exercise. This is one of the most important things you can do for your health. Most adults should: Exercise for at least 150 minutes each week. The exercise should increase your heart rate and make you sweat (moderate-intensity exercise). Do strengthening exercises at least twice a week. This is in addition to the moderate-intensity exercise. Spend less time sitting. Even light physical activity can be beneficial. Watch cholesterol and blood lipids Have your blood tested for lipids and cholesterol at 68 years of age, then have this test every 5 years. You may need to have your cholesterol levels checked more often if: Your lipid or cholesterol levels are high. You are older than 68 years of age. You are at high risk for heart disease. What should I know about cancer screening? Many types of cancers can be detected early and may often be prevented. Depending on your health history and family history, you may need to have cancer screening at various ages. This may include screening for: Colorectal cancer. Prostate cancer. Skin cancer. Lung  cancer. What should I know about heart disease, diabetes, and high blood pressure? Blood pressure and heart disease High blood pressure causes heart disease and increases the risk of stroke. This is more likely to develop in people who have high blood pressure readings or are overweight. Talk with your health care provider about your target blood pressure readings. Have your blood pressure checked: Every 3-5 years if you are 18-39 years of age. Every year if you are 40 years old or older. If you are between the ages of 65 and 75 and are a current or former smoker, ask your health care provider if you should have a one-time screening for abdominal aortic aneurysm (AAA). Diabetes Have regular diabetes screenings. This checks your fasting blood sugar level. Have the screening done: Once every three years after age 45 if you are at a normal weight and have a low risk for diabetes. More often and at a younger age if you are overweight or have a high risk for diabetes. What should I know about preventing infection? Hepatitis B If you have a higher risk for hepatitis B, you should be screened for this virus. Talk with your health care provider to find out if you are at risk for hepatitis B infection. Hepatitis C Blood testing is recommended for: Everyone born from 1945 through 1965. Anyone with known risk factors for hepatitis C. Sexually transmitted infections (STIs) You should be screened each year for STIs, including gonorrhea and chlamydia, if: You are sexually active and are younger than 68 years of age. You are older than 68 years of age and your   health care provider tells you that you are at risk for this type of infection. Your sexual activity has changed since you were last screened, and you are at increased risk for chlamydia or gonorrhea. Ask your health care provider if you are at risk. Ask your health care provider about whether you are at high risk for HIV. Your health care provider  may recommend a prescription medicine to help prevent HIV infection. If you choose to take medicine to prevent HIV, you should first get tested for HIV. You should then be tested every 3 months for as long as you are taking the medicine. Follow these instructions at home: Alcohol use Do not drink alcohol if your health care provider tells you not to drink. If you drink alcohol: Limit how much you have to 0-2 drinks a day. Know how much alcohol is in your drink. In the [place] one drink equals one 12 oz bottle of beer (355 mL), one 5 oz glass of wine (148 mL), or one 1 oz glass of hard liquor (44 mL). Lifestyle Do not use any products that contain nicotine or tobacco. These products include cigarettes, chewing tobacco, and vaping devices, such as e-cigarettes. If you need help quitting, ask your health care provider. Do not use street drugs. Do not share needles. Ask your health care provider for help if you need support or information about quitting drugs. General instructions Schedule regular health, dental, and eye exams. Stay current with your vaccines. Tell your health care provider if: You often feel depressed. You have ever been abused or do not feel safe at home. Summary Adopting a healthy lifestyle and getting preventive care are important in promoting health and wellness. Follow your health care provider's instructions about healthy diet, exercising, and getting tested or screened for diseases. Follow your health care provider's instructions on monitoring your cholesterol and blood pressure. This information is not intended to replace advice given to you by your health care provider. Make sure you discuss any questions you have with your health care provider. Document Revised: 02/09/2021 Document Reviewed: 02/09/2021 Elsevier Patient Education  2024 Elsevier Inc.  

## 2023-06-17 LAB — MICROALBUMIN / CREATININE URINE RATIO
Creatinine, Urine: 84.8 mg/dL
Microalb/Creat Ratio: 5 mg/g{creat} (ref 0–29)
Microalbumin, Urine: 4.5 ug/mL

## 2023-06-17 NOTE — Assessment & Plan Note (Signed)
Excellent control of hypertension continue Imdur lisinopril metoprolol.

## 2023-06-17 NOTE — Assessment & Plan Note (Signed)
He is doing very well with his diabetes we will continue Synjardy.  I will see him again in 6 months.

## 2023-06-21 ENCOUNTER — Other Ambulatory Visit (HOSPITAL_COMMUNITY): Payer: Self-pay

## 2023-06-21 ENCOUNTER — Other Ambulatory Visit: Payer: Self-pay | Admitting: Cardiovascular Disease

## 2023-06-21 MED ORDER — LISINOPRIL 10 MG PO TABS
10.0000 mg | ORAL_TABLET | Freq: Every day | ORAL | 2 refills | Status: DC
  Filled 2023-06-21: qty 90, 90d supply, fill #0
  Filled 2023-09-18: qty 90, 90d supply, fill #1
  Filled 2024-01-27: qty 90, 90d supply, fill #2

## 2023-08-12 ENCOUNTER — Other Ambulatory Visit: Payer: Self-pay | Admitting: Cardiovascular Disease

## 2023-09-18 ENCOUNTER — Other Ambulatory Visit: Payer: Self-pay

## 2023-09-18 ENCOUNTER — Other Ambulatory Visit (HOSPITAL_COMMUNITY): Payer: Self-pay

## 2023-09-18 NOTE — Progress Notes (Signed)
This patient is appearing on a report for being at risk of failing the adherence measure for hypertension (ACEi/ARB) medications this calendar year.   Medication: lisinopril 10 mg PO daily Last fill date: 06/24/23 for 90 day supply  Spoke with patient who confirmed needing refill for lisinopril. Confirmed that he would like to continue filling this one medication at [facility] pharmacy. Initiated fill at [facility] outpatient pharmacy. Pt will plan to pick up this week. Denied needing refills for other medications. Confirmed that he is taking his maintenance medications daily.   Nils Pyle, PharmD PGY1 Pharmacy Resident

## 2023-10-11 ENCOUNTER — Other Ambulatory Visit: Payer: Self-pay | Admitting: Cardiovascular Disease

## 2023-10-21 ENCOUNTER — Other Ambulatory Visit (HOSPITAL_BASED_OUTPATIENT_CLINIC_OR_DEPARTMENT_OTHER): Payer: Self-pay | Admitting: Cardiovascular Disease

## 2023-11-02 DIAGNOSIS — H35373 Puckering of macula, bilateral: Secondary | ICD-10-CM | POA: Diagnosis not present

## 2023-11-02 DIAGNOSIS — E119 Type 2 diabetes mellitus without complications: Secondary | ICD-10-CM | POA: Diagnosis not present

## 2023-11-02 LAB — HM DIABETES EYE EXAM

## 2023-11-09 ENCOUNTER — Ambulatory Visit: Payer: PPO

## 2023-11-09 VITALS — Ht 71.0 in | Wt 188.0 lb

## 2023-11-09 DIAGNOSIS — Z0000 Encounter for general adult medical examination without abnormal findings: Secondary | ICD-10-CM | POA: Diagnosis not present

## 2023-11-09 NOTE — Patient Instructions (Addendum)
 [name] , Thank you for taking time to come for your Medicare Wellness Visit. I appreciate your ongoing commitment to your health goals. Please review the following plan we discussed and let me know if I can assist you in the future.   Referrals/Orders/Follow-Ups/Clinician Recommendations: Yes; Keep maintaining your health by keeping your appointments with Dr. Rosan and any specialists that you may see.  Call us  if you need anything.  Have a great year!!!!  This is a list of the screening recommended for you and due dates:  Health Maintenance  Topic Date Due   COVID-19 Vaccine (3 - Pfizer risk series) 02/15/2020   Eye exam for diabetics  11/19/2022   Complete foot exam   09/24/2023   Screening for Lung Cancer  06/15/2024*   Hemoglobin A1C  12/14/2023   Yearly kidney function blood test for diabetes  02/02/2024   Colon Cancer Screening  03/18/2024   Yearly kidney health urinalysis for diabetes  06/15/2024   DTaP/Tdap/Td vaccine (2 - Td or Tdap) 03/27/2025   Pneumonia Vaccine  Completed   Flu Shot  Completed   Hepatitis C Screening  Completed   Zoster (Shingles) Vaccine  Completed   HPV Vaccine  Aged Out  *Topic was postponed. The date shown is not the original due date.    Advanced directives: (Declined) Advance directive discussed with you today. Even though you declined this today, please call our office should you change your mind, and we can give you the proper paperwork for you to fill out.  Next Medicare Annual Wellness Visit scheduled for next year: Yes; It was nice speaking with you today! Your next Annual Wellness Visit is scheduled for 11/14/2024 at 1:00 p.m. via video. If you need to reschedule or cancel, please call [phone].

## 2023-11-09 NOTE — Progress Notes (Signed)
 Subjective:   Jason Velasquez is a 68 y.o. male who presents for Medicare Annual/Subsequent preventive examination.  Visit Complete: Virtual I connected with  Lynwood Broker on 11/09/23 by a video and audio enabled telemedicine application and verified that I am speaking with the correct person using two identifiers.  Patient Location: Home  Provider Location: Office/Clinic  I discussed the limitations of evaluation and management by telemedicine. The patient expressed understanding and agreed to proceed.  Vital Signs: Because this visit was a virtual/telehealth visit, some criteria may be missing or patient reported. Any vitals not documented were not able to be obtained and vitals that have been documented are patient reported.  Cardiac Risk Factors include: advanced age (>50men, >75 women);sedentary lifestyle;diabetes mellitus;dyslipidemia;family history of premature cardiovascular disease;hypertension;male gender     Objective:    Today's Vitals   11/09/23 1301 11/09/23 1302  Weight: 188 lb (85.3 kg)   Height: 5' 11 (1.803 m)   PainSc: 3  3   PainLoc: Hip    Body mass index is 26.22 kg/m.     11/09/2023    1:04 PM 06/16/2023    2:28 PM 06/16/2023   10:51 AM 03/17/2023   10:41 AM 09/23/2022   11:10 AM 03/18/2022    8:45 AM 10/15/2021   10:45 AM  Advanced Directives  Does Patient Have a Medical Advance Directive? No No No No No No No  Would patient like information on creating a medical advance directive? No - Patient declined No - Patient declined No - Patient declined No - Patient declined No - Patient declined No - Patient declined No - Patient declined    Current Medications (verified) Outpatient Encounter Medications as of 11/09/2023  Medication Sig   acetaminophen  (TYLENOL ) 500 MG tablet Take 1,000 mg by mouth every 6 (six) hours as needed for moderate pain or headache.   atorvastatin  (LIPITOR ) 80 MG tablet Take 1 tablet (80 mg total) by mouth every evening.    clopidogrel  (PLAVIX ) 75 MG tablet Take 1 tablet by mouth once daily   Empagliflozin -metFORMIN  HCl (SYNJARDY ) 02-999 MG TABS Take 1 tablet by mouth 2 (two) times daily with a meal.   ferrous sulfate  325 (65 FE) MG tablet Take 1 tablet (325 mg total) by mouth every other day.   glucose blood (ONETOUCH VERIO) test strip Use as instructed   Hypromellose (ARTIFICIAL TEARS OP) Place 1 drop into both eyes daily as needed (for dry eyes).    icosapent  Ethyl (VASCEPA ) 1 g capsule Take 2 capsules (2 g total) by mouth 2 (two) times daily.   isosorbide  mononitrate (IMDUR ) 120 MG 24 hr tablet TAKE 1/2 (ONE-HALF) TABLET BY MOUTH IN THE MORNING AND 1 TABLET IN THE EVENING   Lancets (ONETOUCH DELICA PLUS LANCET33G) MISC 1 Device by Does not apply route in the morning and at bedtime.   lisinopril  (ZESTRIL ) 10 MG tablet Take 1 tablet (10 mg total) by mouth daily.   metoprolol  (TOPROL -XL) 200 MG 24 hr tablet Take 1 tablet (200 mg total) by mouth daily.   nitroGLYCERIN  (NITROSTAT ) 0.4 MG SL tablet Place 1 tablet (0.4 mg total) under the tongue every 5 (five) minutes as needed for chest pain.   ranolazine  (RANEXA ) 1000 MG SR tablet TAKE 1 TABLET BY MOUTH 2 TIMES A DAY; NEW DOSE FROM DOCTOR   Semaglutide ,0.25 or 0.5MG /DOS, 2 MG/3ML SOPN Inject 0.5 mg into the skin once a week.   No facility-administered encounter medications on file as of 11/09/2023.    Allergies (verified)  Patient has no known allergies.   History: Past Medical History:  Diagnosis Date   Angina, class III (HCC) 08/24/2015   Arthritis    hands (01/25/2018)   AVM (arteriovenous malformation) 08/15/2017   CAD (coronary artery disease) of artery bypass graft    CABG- 2007    Carotid stenosis 03/03/2016   40-59% RICA, 1-39% LICA, >50% RECA stenosis Repeat 12/2016.   Claudication [facility])    Controlled type 2 diabetes mellitus without complication (HCC) 12/07/2014   Dx March of 2016. GAD65 negative Anti Islet cell negative     DKA (diabetic  ketoacidoses) 12/07/2014   Dyspnea    History of blood transfusion ~ 08/2017   related to LGIB   History of hiatal hernia    Hx of adenomatous colonic polyps 05/27/2015   Hypercholesterolemia    Hypertension    Hypertensive heart disease 03/03/2016   Iron deficiency anemia due to chronic blood loss suspect from AVMs on Plavix     Left nephrolithiasis 12/07/2014   Migraine    only in my 30's (01/25/2018)   Non-ST elevation (NSTEMI) myocardial infarction [facility])    Pneumonia ~ 1990   Stroke (HCC) 07/12/2014   I didn't realize I'd had one; hospital found it; denies residual on 01/25/2018   Tubular adenoma of colon 05/31/2015   Colonoscopy 05/20/15 with Dr Avram: 3 tubullar adenoma's largest 2.3cm.  Plan for repeat Colonoscopy in 2019.   Past Surgical History:  Procedure Laterality Date   CARDIAC CATHETERIZATION N/A 08/26/2015   Procedure: Left Heart Cath and Cors/Grafts Angiography;  Surgeon: Alm LELON Clay, MD;  Location: [facility] INVASIVE CV LAB;  Service: Cardiovascular;  Laterality: N/A;   CORONARY ARTERY BYPASS GRAFT  2007   CABG X3, at [facility]    CORONARY BALLOON ANGIOPLASTY N/A 05/25/2017   Procedure: CORONARY BALLOON ANGIOPLASTY;  Surgeon: Clay Alm LELON, MD;  Location: [facility] INVASIVE CV LAB;  Service: Cardiovascular;  Laterality: N/A;   CORONARY BALLOON ANGIOPLASTY N/A 01/25/2018   Procedure: CORONARY BALLOON ANGIOPLASTY;  Surgeon: Clay Alm LELON, MD;  Location: [facility] INVASIVE CV LAB;  Service: Cardiovascular;  Laterality: N/A;   CORONARY STENT INTERVENTION N/A 05/25/2017   Procedure: CORONARY STENT INTERVENTION;  Surgeon: Clay Alm LELON, MD;  Location: [facility] INVASIVE CV LAB;  Service: Cardiovascular;  Laterality: N/A;   CORONARY STENT INTERVENTION N/A 01/25/2018   Procedure: CORONARY STENT INTERVENTION;  Surgeon: Clay Alm LELON, MD;  Location: [facility] INVASIVE CV LAB;  Service: Cardiovascular;  Laterality: N/A;   ESOPHAGOGASTRODUODENOSCOPY N/A 08/12/2017   Procedure:  ESOPHAGOGASTRODUODENOSCOPY (EGD);  Surgeon: Albertus Gordy HERO, MD;  Location: [facility] ENDOSCOPY;  Service: Gastroenterology;  Laterality: N/A;   LEFT HEART CATH AND CORS/GRAFTS ANGIOGRAPHY N/A 05/23/2017   Procedure: LEFT HEART CATH AND CORS/GRAFTS ANGIOGRAPHY;  Surgeon: Clay Alm LELON, MD;  Location: [facility] INVASIVE CV LAB;  Service: Cardiovascular;  Laterality: N/A;   LEFT HEART CATH AND CORS/GRAFTS ANGIOGRAPHY N/A 08/15/2017   Procedure: LEFT HEART CATH AND CORS/GRAFTS ANGIOGRAPHY;  Surgeon: Verlin Lonni BIRCH, MD;  Location: [facility] INVASIVE CV LAB;  Service: Cardiovascular;  Laterality: N/A;   LEFT HEART CATH AND CORS/GRAFTS ANGIOGRAPHY N/A 01/25/2018   Procedure: LEFT HEART CATH AND CORS/GRAFTS ANGIOGRAPHY;  Surgeon: Clay Alm LELON, MD;  Location: [facility] INVASIVE CV LAB;  Service: Cardiovascular;  Laterality: N/A;   Family History  Problem Relation Age of Onset   Hypertension Mother    Diabetes Father    Crohn's disease Daughter    Colon cancer Neg Hx    Rectal cancer  Neg Hx    Social History   Socioeconomic History   Marital status: Divorced    Spouse name: Not on file   Number of children: Not on file   Years of education: 13   Highest education level: Not on file  Occupational History   Occupation: Retired on disability  Tobacco Use   Smoking status: Former    Current packs/day: 0.00    Average packs/day: 2.0 packs/day for 38.0 years (76.0 ttl pk-yrs)    Types: Cigarettes    Start date: 10/05/1971    Quit date: 10/04/2009    Years since quitting: 14.1   Smokeless tobacco: Never  Vaping Use   Vaping status: Never Used  Substance and Sexual Activity   Alcohol  use: Not Currently    Comment: Rarely.   Drug use: No   Sexual activity: Not Currently  Other Topics Concern   Not on file  Social History Narrative   Divorced   2 children, 1 [place] 1 [place]   Former smoker rare EtOH no drugs   Epworth Sleepiness Scale = 7 (as of 07/28/2015)      Current Social History 08/07/2020       Patient  lives alone in a home which is 1 story. There are 5 steps with handrail up to the entrance the patient uses.       Patient's method of transportation is personal car.      The highest level of education was some college      The patient currently, retired on disability      Identified important Relationships are: States he has people he can count on but did not wish to disclose list.       Pets : 1 indoor/outdoor Brewing Technologist / Fun: Being on the computer, watching TV, keeping the lawn, shrubberies looking nice.      Current Stressors: None, learning to control any stress whatsoever.      Religious / Personal Beliefs: I believe in God. [facility])       L. [place], BSN, RN-BC          Social Drivers of Health   Financial Resource Strain: Low Risk  (11/09/2023)   Overall Financial Resource Strain (CARDIA)    Difficulty of Paying Living Expenses: Not hard at all  Food Insecurity: No Food Insecurity (11/09/2023)   Hunger Vital Sign    Worried About Running Out of Food in the Last Year: Never true    Ran Out of Food in the Last Year: Never true  Transportation Needs: No Transportation Needs (11/09/2023)   PRAPARE - Administrator, Civil Service (Medical): No    Lack of Transportation (Non-Medical): No  Physical Activity: Inactive (11/09/2023)   Exercise Vital Sign    Days of Exercise per Week: 0 days    Minutes of Exercise per Session: 0 min  Stress: No Stress Concern Present (11/09/2023)   [employer] of Occupational Health - Occupational Stress Questionnaire    Feeling of Stress : Not at all  Social Connections: Moderately Isolated (11/09/2023)   Social Connection and Isolation Panel [NHANES]    Frequency of Communication with Friends and Family: More than three times a week    Frequency of Social Gatherings with Friends and Family: More than three times a week    Attends Religious Services: 1 to 4 times per year    Active Member of Clubs or  Organizations: No  Attends Banker Meetings: Never    Marital Status: Divorced    Tobacco Counseling Counseling given: Not Answered   Clinical Intake:  Pre-visit preparation completed: Yes  Pain : 0-10 Pain Score: 3  Pain Type: Chronic pain Pain Location: Hip Pain Orientation: Left Pain Descriptors / Indicators: Discomfort, Aching     BMI - recorded: 26.22 Nutritional Risks: None Diabetes: Yes CBG done?: No Did pt. bring in CBG monitor from home?: No  How often do you need to have someone help you when you read instructions, pamphlets, or other written materials from your doctor or pharmacy?: 1 - Never What is the last grade level you completed in school?: HSG; SOME COLLEGE  Interpreter Needed?: No  Information entered by :: Roz Fuller, LPN.   Activities of Daily Living    11/09/2023    1:06 PM 06/16/2023    2:22 PM  In your present state of health, do you have any difficulty performing the following activities:  Hearing? 0 0  Vision? 0 1  Comment  Eyes become dry and blurry.  Difficulty concentrating or making decisions? 0 0  Walking or climbing stairs? 0 0  Dressing or bathing? 0 0  Doing errands, shopping? 0 0  Preparing Food and eating ? N   Using the Toilet? N   In the past six months, have you accidently leaked urine? N   Do you have problems with loss of bowel control? N   Managing your Medications? N   Managing your Finances? N   Housekeeping or managing your Housekeeping? N     Patient Care Team: Rosan Dayton BROCKS, DO as PCP - General (Internal Medicine) Raford Riggs, MD as PCP - Cardiology (Cardiology) Avram Lupita BRAVO, MD as Consulting Physician (Gastroenterology) Raford Riggs, MD as Attending Physician (Cardiology) Harrietta Kurtz, OD as Consulting Physician (Optometry) Harrietta Kurtz, OD as Consulting Physician (Optometry)  Indicate any recent Medical Services you may have received from other than [facility]  providers in the past year (date may be approximate).     Assessment:   This is a routine wellness examination for Keyshawn.  Hearing/Vision screen Hearing Screening - Comments:: Denies hearing difficulties.  Vision Screening - Comments:: Wears rx glasses - up to date with routine eye exams with [facility] with Dr. Kurtz Harrietta    Goals Addressed             This Visit's Progress    My goal for 2025 is to get back to doing physical exercise.        Depression Screen    11/09/2023    1:06 PM 06/16/2023    2:28 PM 06/16/2023   10:52 AM 03/17/2023   10:40 AM 09/23/2022   11:09 AM 03/18/2022    8:44 AM 10/15/2021   10:45 AM  PHQ 2/9 Scores  PHQ - 2 Score 0 0 0 0 0 0 0  PHQ- 9 Score 0          Fall Risk    11/09/2023    1:05 PM 06/16/2023    2:29 PM 06/16/2023   10:51 AM 03/17/2023   10:40 AM 09/23/2022   11:09 AM  Fall Risk   Falls in the past year? 0 0 0 0 0  Number falls in past yr: 0 0 0 0 0  Injury with Fall? 0 0 0 0 0  Risk for fall due to : No Fall Risks No Fall Risks No Fall Risks No Fall Risks No Fall Risks  Follow up Falls prevention discussed;Falls evaluation completed Falls evaluation completed Falls evaluation completed;Falls prevention discussed Falls evaluation completed;Falls prevention discussed Falls evaluation completed;Falls prevention discussed    MEDICARE RISK AT HOME: Medicare Risk at Home Any stairs in or around the home?: No If so, are there any without handrails?: No Home free of loose throw rugs in walkways, pet beds, electrical cords, etc?: Yes Adequate lighting in your home to reduce risk of falls?: Yes Life alert?: No Use of a cane, walker or w/c?: No Grab bars in the bathroom?: No Shower chair or bench in shower?: No Elevated toilet seat or a handicapped toilet?: No  TIMED UP AND GO:  Was the test performed?  No    Cognitive Function:    11/09/2023    1:05 PM  MMSE - Mini Mental State Exam  Not completed: Unable to complete         11/09/2023    1:05 PM  6CIT Screen  What Year? 0 points  What month? 0 points  What time? 0 points  Count back from 20 0 points  Months in reverse 0 points  Repeat phrase 0 points  Total Score 0 points    Immunizations Immunization History  Administered Date(s) Administered   Fluad Quad(high Dose 65+) 10/15/2021, 09/23/2022   Fluad Trivalent(High Dose 65+) 06/16/2023   Influenza,inj,Quad PF,6+ Mos 09/21/2018, 07/12/2019, 09/18/2020   PFIZER(Purple Top)SARS-COV-2 Vaccination 12/28/2019, 01/18/2020   PNEUMOCOCCAL CONJUGATE-20 03/18/2022   Pneumococcal Polysaccharide-23 03/28/2015   Tdap 03/28/2015   Zoster Recombinant(Shingrix) 09/14/2022, 03/04/2023    TDAP status: Up to date  Flu Vaccine status: Up to date  Pneumococcal vaccine status: Up to date  Covid-19 vaccine status: Completed vaccines  Qualifies for Shingles Vaccine? Yes   Zostavax completed No   Shingrix Completed?: Yes  Screening Tests Health Maintenance  Topic Date Due   COVID-19 Vaccine (3 - Pfizer risk series) 02/15/2020   OPHTHALMOLOGY EXAM  11/19/2022   FOOT EXAM  09/24/2023   Lung Cancer Screening  06/15/2024 (Originally 02/07/2005)   HEMOGLOBIN A1C  12/14/2023   Diabetic kidney evaluation - eGFR measurement  02/02/2024   Colonoscopy  03/18/2024   Diabetic kidney evaluation - Urine ACR  06/15/2024   DTaP/Tdap/Td (2 - Td or Tdap) 03/27/2025   Pneumonia Vaccine 59+ Years old  Completed   INFLUENZA VACCINE  Completed   Hepatitis C Screening  Completed   Zoster Vaccines- Shingrix  Completed   HPV VACCINES  Aged Out    Health Maintenance  Health Maintenance Due  Topic Date Due   COVID-19 Vaccine (3 - Pfizer risk series) 02/15/2020   OPHTHALMOLOGY EXAM  11/19/2022   FOOT EXAM  09/24/2023    Colorectal cancer screening: Type of screening: Colonoscopy. Completed 03/19/2019. Repeat every 5 years  Lung Cancer Screening: (Low Dose CT Chest recommended if Age 63-80 years, 20 pack-year  currently smoking OR have quit w/in 15years.) does not qualify.   Lung Cancer Screening Referral: NO  Additional Screening:  Hepatitis C Screening: does qualify; Completed 07/10/2015  Vision Screening: Recommended annual ophthalmology exams for early detection of glaucoma and other disorders of the eye. Is the patient up to date with their annual eye exam?  Yes  Who is the provider or what is the name of the office in which the patient attends annual eye exams? Dr. Garrel Satchel If pt is not established with a provider, would they like to be referred to a provider to establish care? No .   Dental Screening: Recommended  annual dental exams for proper oral hygiene  Diabetic Foot Exam: Diabetic Foot Exam: Completed 09/23/2022-patient is overdue  Community Resource Referral / Chronic Care Management: CRR required this visit?  No   CCM required this visit?  No     Plan:     I have personally reviewed and noted the following in the patient's chart:   Medical and social history Use of alcohol , tobacco or illicit drugs  Current medications and supplements including opioid prescriptions. Patient is not currently taking opioid prescriptions. Functional ability and status Nutritional status Physical activity Advanced directives List of other physicians Hospitalizations, surgeries, and ER visits in previous 12 months Vitals Screenings to include cognitive, depression, and falls Referrals and appointments  In addition, I have reviewed and discussed with patient certain preventive protocols, quality metrics, and best practice recommendations. A written personalized care plan for preventive services as well as general preventive health recommendations were provided to patient.     Roz LOISE Fuller, LPN   04/06/7973   After Visit Summary: (MyChart) Due to this being a telephonic visit, the after visit summary with patients personalized plan was offered to patient via MyChart   Nurse  Notes: Patient is overdue for foot exam.

## 2023-11-10 ENCOUNTER — Encounter: Payer: Self-pay | Admitting: Dietician

## 2023-11-14 ENCOUNTER — Other Ambulatory Visit: Payer: Self-pay

## 2023-11-14 MED ORDER — SEMAGLUTIDE(0.25 OR 0.5MG/DOS) 2 MG/3ML ~~LOC~~ SOPN: 0.5000 mg | PEN_INJECTOR | SUBCUTANEOUS | 5 refills | Status: DC

## 2023-11-14 NOTE — Telephone Encounter (Signed)
 Medication sent to pharmacy

## 2023-12-21 ENCOUNTER — Telehealth: Payer: Self-pay

## 2023-12-21 NOTE — Telephone Encounter (Signed)
 Prior Authorization for patient (Ozempic (0.25 or 0.5 MG/DOSE) 2MG /3ML pen-injectors) came through on cover my meds was submitted with last office notes and labs awaiting approval or denial.  KEY: [id number]

## 2023-12-22 ENCOUNTER — Encounter: Payer: Self-pay | Admitting: Internal Medicine

## 2023-12-22 ENCOUNTER — Ambulatory Visit: Admitting: Internal Medicine

## 2023-12-22 VITALS — BP 109/62 | HR 80 | Temp 97.9°F | Ht 71.0 in | Wt 194.2 lb

## 2023-12-22 DIAGNOSIS — Z9861 Coronary angioplasty status: Secondary | ICD-10-CM | POA: Diagnosis not present

## 2023-12-22 DIAGNOSIS — E1151 Type 2 diabetes mellitus with diabetic peripheral angiopathy without gangrene: Secondary | ICD-10-CM | POA: Diagnosis not present

## 2023-12-22 DIAGNOSIS — I10 Essential (primary) hypertension: Secondary | ICD-10-CM | POA: Diagnosis not present

## 2023-12-22 DIAGNOSIS — D500 Iron deficiency anemia secondary to blood loss (chronic): Secondary | ICD-10-CM

## 2023-12-22 DIAGNOSIS — I2510 Atherosclerotic heart disease of native coronary artery without angina pectoris: Secondary | ICD-10-CM

## 2023-12-22 DIAGNOSIS — Z7984 Long term (current) use of oral hypoglycemic drugs: Secondary | ICD-10-CM | POA: Diagnosis not present

## 2023-12-22 DIAGNOSIS — E785 Hyperlipidemia, unspecified: Secondary | ICD-10-CM | POA: Diagnosis not present

## 2023-12-22 DIAGNOSIS — M1612 Unilateral primary osteoarthritis, left hip: Secondary | ICD-10-CM | POA: Insufficient documentation

## 2023-12-22 LAB — POCT GLYCOSYLATED HEMOGLOBIN (HGB A1C): Hemoglobin A1C: 6.3 % — AB (ref 4.0–5.6)

## 2023-12-22 LAB — GLUCOSE, CAPILLARY: Glucose-Capillary: 115 mg/dL — ABNORMAL HIGH (ref 70–99)

## 2023-12-22 NOTE — Telephone Encounter (Signed)
 Jason Velasquez (Key: [id number]) Rx #: [id number] Ozempic (0.25 or 0.5 MG/DOSE) 2MG [name] pen-injectors Form [facility] [employer] Medicare Electronic Prior Authorization Form 2017 NCPDP Created Message from Plan 19-MAR-25:19-MAR-26 Ozempic (0.25 or 0.5 MG/DOSE) 2MG /3ML [place] SOPN Quantity:3;

## 2023-12-22 NOTE — Assessment & Plan Note (Signed)
 Will recheck lipid panel of note he is taking Vascepa only 1 or 2 capsules at night as he gets nauseous if he takes it in the morning.  He does report taking atorvastatin as directed.

## 2023-12-22 NOTE — Progress Notes (Signed)
 Established Patient Office Visit  Subjective   Patient ID: Jason Velasquez, male    DOB: 1956-06-21  Age: 68 y.o. MRN: [id number]  Chief Complaint  Patient presents with   Medical Management of Chronic Issues    Diabetes. Problem w/left hip.   Wilma reports no significant complaints overall he feels like he is doing well with his diabetes and hypertension.  He only has 1 relatively minor complaint which is his left hip.  He notes that it feels a little stiff in the mornings and takes about 10 to 15 minutes for it to loosen up but generally feels better after that.  Notes that it is worse when sitting as a passenger and his coworkers truck but usually okay when he is riding in his own truck.  He denies any grinding or popping noises.  No fever or chills.     Objective:     BP 109/62 (BP Location: Right Arm, Patient Position: Sitting, Cuff Size: Large)   Pulse 80   Temp 97.9 F (36.6 C) (Oral)   Ht 5\' 11"  (1.803 m)   Wt 194 lb 3.2 oz (88.1 kg)   SpO2 100% Comment: RA  BMI 27.09 kg/m  BP Readings from Last 3 Encounters:  12/22/23 109/62  06/16/23 116/69  06/16/23 116/69   Wt Readings from Last 3 Encounters:  12/22/23 194 lb 3.2 oz (88.1 kg)  11/09/23 188 lb (85.3 kg)  06/16/23 188 lb 11.2 oz (85.6 kg)      Physical Exam Constitutional:      Appearance: Normal appearance.  Cardiovascular:     Rate and Rhythm: Normal rate and regular rhythm.  Pulmonary:     Effort: Pulmonary effort is normal.     Breath sounds: Normal breath sounds.  Musculoskeletal:     Comments: Left hip full active and passive ROM.  Mild tenderness of trochanter, no crepitus, warmth or errythma  Neurological:     Mental Status: He is alert.      Results for orders placed or performed in visit on 12/22/23  Glucose, capillary  Result Value Ref Range   Glucose-Capillary 115 (H) 70 - 99 mg/dL  POC Hbg I6N  Result Value Ref Range   Hemoglobin A1C 6.3 (A) 4.0 - 5.6 %   HbA1c POC (<> result, manual  entry)     HbA1c, POC (prediabetic range)     HbA1c, POC (controlled diabetic range)      Last metabolic panel Lab Results  Component Value Date   GLUCOSE 140 (H) 02/02/2023   NA 139 02/02/2023   K 4.5 02/02/2023   CL 103 02/02/2023   CO2 21 02/02/2023   BUN 8 02/02/2023   CREATININE 1.07 02/02/2023   EGFR 76 02/02/2023   CALCIUM 9.5 02/02/2023   PHOS 4.2 12/07/2014   PROT 6.9 02/02/2023   ALBUMIN 4.5 02/02/2023   LABGLOB 2.4 02/02/2023   AGRATIO 1.9 02/02/2023   BILITOT 0.4 02/02/2023   ALKPHOS 62 02/02/2023   AST 21 02/02/2023   ALT 30 02/02/2023   ANIONGAP 7 01/26/2018      The ASCVD Risk score (Arnett DK, et al., 2019) failed to calculate for the following reasons:   Risk score cannot be calculated because patient has a medical history suggesting prior/existing ASCVD    Assessment & Plan:   Problem List Items Addressed This Visit       Cardiovascular and Mediastinum   Essential hypertension (Chronic)   Continues to have excellent control we will continue  him on his regimen of Imdur lisinopril and metoprolol.      Relevant Orders   CMP14 + Anion Gap   CBC no Diff   Lipid Profile   DM (diabetes mellitus), type 2 with peripheral vascular complications (HCC) - Primary (Chronic)   Diabetes remains well-controlled and I will continue him on Synjardy.  52-month follow-up.      Relevant Orders   POC Hbg A1C (Completed)   CMP14 + Anion Gap   CBC no Diff   Lipid Profile   CAD S/P percutaneous coronary angioplasty: Complex PCI with DES 8/22/218 (Chronic)   Denies any chest pain      Relevant Orders   Lipid Profile     Musculoskeletal and Integument   Osteoarthritis of left hip   Discussed with Fayrene Fearing that his symptoms are consistent with left hip osteoarthritis.  Recommended weight loss and continued exercise.  Discussed the use of Tylenol and intermittent use of nonsteroidal anti-inflammatories and the nature of the disease progression but I think it is  relatively mild right now and I do not think we need plain films.        Other   Dyslipidemia (Chronic)   Will recheck lipid panel of note he is taking Vascepa only 1 or 2 capsules at night as he gets nauseous if he takes it in the morning.  He does report taking atorvastatin as directed.      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  He has been taking oral iron every other day.  Will recheck a CBC and iron panel.      Relevant Orders   CBC no Diff   Iron, TIBC and Ferritin Panel    Return in about 6 months (around 06/23/2024) for DM, HTN.    Gust Rung, DO

## 2023-12-22 NOTE — Assessment & Plan Note (Signed)
 Denies any chest pain

## 2023-12-22 NOTE — Assessment & Plan Note (Signed)
 Continues to have excellent control we will continue him on his regimen of Imdur lisinopril and metoprolol.

## 2023-12-22 NOTE — Assessment & Plan Note (Signed)
 Due for repeat colonoscopy in a few months.  He has been taking oral iron every other day.  Will recheck a CBC and iron panel.

## 2023-12-22 NOTE — Assessment & Plan Note (Signed)
 Diabetes remains well-controlled and I will continue him on Synjardy.  92-month follow-up.

## 2023-12-22 NOTE — Assessment & Plan Note (Signed)
 Discussed with Jason Velasquez that his symptoms are consistent with left hip osteoarthritis.  Recommended weight loss and continued exercise.  Discussed the use of Tylenol and intermittent use of nonsteroidal anti-inflammatories and the nature of the disease progression but I think it is relatively mild right now and I do not think we need plain films.

## 2023-12-23 ENCOUNTER — Encounter: Payer: Self-pay | Admitting: Internal Medicine

## 2023-12-23 LAB — IRON,TIBC AND FERRITIN PANEL
Ferritin: 24 ng/mL — ABNORMAL LOW (ref 30–400)
Iron Saturation: 13 % — ABNORMAL LOW (ref 15–55)
Iron: 43 ug/dL (ref 38–169)
Total Iron Binding Capacity: 322 ug/dL (ref 250–450)
UIBC: 279 ug/dL (ref 111–343)

## 2023-12-23 LAB — CMP14 + ANION GAP
ALT: 23 IU/L (ref 0–44)
AST: 16 IU/L (ref 0–40)
Albumin: 4.2 g/dL (ref 3.9–4.9)
Alkaline Phosphatase: 62 IU/L (ref 44–121)
Anion Gap: 14 mmol/L (ref 10.0–18.0)
BUN/Creatinine Ratio: 12 (ref 10–24)
BUN: 12 mg/dL (ref 8–27)
Bilirubin Total: 0.2 mg/dL (ref 0.0–1.2)
CO2: 23 mmol/L (ref 20–29)
Calcium: 9.2 mg/dL (ref 8.6–10.2)
Chloride: 102 mmol/L (ref 96–106)
Creatinine, Ser: 1.04 mg/dL (ref 0.76–1.27)
Globulin, Total: 2 g/dL (ref 1.5–4.5)
Glucose: 121 mg/dL — ABNORMAL HIGH (ref 70–99)
Potassium: 4.9 mmol/L (ref 3.5–5.2)
Sodium: 139 mmol/L (ref 134–144)
Total Protein: 6.2 g/dL (ref 6.0–8.5)
eGFR: 78 mL/min/{1.73_m2} (ref >59)

## 2023-12-23 LAB — LIPID PANEL
Chol/HDL Ratio: 3.6 ratio (ref 0.0–5.0)
Cholesterol, Total: 79 mg/dL — ABNORMAL LOW (ref 100–199)
HDL: 22 mg/dL — ABNORMAL LOW (ref >39)
LDL Chol Calc (NIH): 32 mg/dL (ref 0–99)
Triglycerides: 145 mg/dL (ref 0–149)
VLDL Cholesterol Cal: 25 mg/dL (ref 5–40)

## 2023-12-23 LAB — CBC
Hematocrit: 40.7 % (ref 37.5–51.0)
Hemoglobin: 13 g/dL (ref 13.0–17.7)
MCH: 25.6 pg — ABNORMAL LOW (ref 26.6–33.0)
MCHC: 31.9 g/dL (ref 31.5–35.7)
MCV: 80 fL (ref 79–97)
Platelets: 252 10*3/uL (ref 150–450)
RBC: 5.07 x10E6/uL (ref 4.14–5.80)
RDW: 16.7 % — ABNORMAL HIGH (ref 11.6–15.4)
WBC: 5 10*3/uL (ref 3.4–10.8)

## 2024-03-23 ENCOUNTER — Ambulatory Visit (HOSPITAL_COMMUNITY)
Admission: RE | Admit: 2024-03-23 | Discharge: 2024-03-23 | Disposition: A | Discharge status: Home / Self Care | Source: Ambulatory Visit | Attending: Cardiovascular Disease | Admitting: Cardiovascular Disease

## 2024-03-23 DIAGNOSIS — I6523 Occlusion and stenosis of bilateral carotid arteries: Secondary | ICD-10-CM | POA: Diagnosis not present

## 2024-03-29 ENCOUNTER — Ambulatory Visit: Payer: Self-pay | Admitting: Cardiovascular Disease

## 2024-03-29 DIAGNOSIS — I6523 Occlusion and stenosis of bilateral carotid arteries: Secondary | ICD-10-CM

## 2024-03-31 ENCOUNTER — Other Ambulatory Visit: Payer: Self-pay | Admitting: Cardiovascular Disease

## 2024-04-02 NOTE — Telephone Encounter (Signed)
 Rx refill sent to pharmacy.

## 2024-04-09 ENCOUNTER — Other Ambulatory Visit: Payer: Self-pay

## 2024-04-09 ENCOUNTER — Encounter: Payer: Self-pay | Admitting: Internal Medicine

## 2024-04-09 ENCOUNTER — Ambulatory Visit: Admitting: Internal Medicine

## 2024-04-09 VITALS — BP 119/67 | HR 77 | Temp 98.0°F | Ht 70.5 in | Wt 191.2 lb

## 2024-04-09 DIAGNOSIS — Z7985 Long-term (current) use of injectable non-insulin antidiabetic drugs: Secondary | ICD-10-CM | POA: Diagnosis not present

## 2024-04-09 DIAGNOSIS — E1151 Type 2 diabetes mellitus with diabetic peripheral angiopathy without gangrene: Secondary | ICD-10-CM

## 2024-04-09 DIAGNOSIS — Z7984 Long term (current) use of oral hypoglycemic drugs: Secondary | ICD-10-CM

## 2024-04-09 DIAGNOSIS — Z860101 Personal history of adenomatous and serrated colon polyps: Secondary | ICD-10-CM

## 2024-04-09 DIAGNOSIS — I10 Essential (primary) hypertension: Secondary | ICD-10-CM

## 2024-04-09 DIAGNOSIS — I6523 Occlusion and stenosis of bilateral carotid arteries: Secondary | ICD-10-CM

## 2024-04-09 LAB — POCT GLYCOSYLATED HEMOGLOBIN (HGB A1C): Hemoglobin A1C: 6.2 % — AB (ref 4.0–5.6)

## 2024-04-09 LAB — GLUCOSE, CAPILLARY: Glucose-Capillary: 94 mg/dL (ref 70–99)

## 2024-04-09 NOTE — Assessment & Plan Note (Signed)
 Blood pressure is well-controlled continue lisinopril  10 mg metoprolol  200 mg Imdur  120 mg daily

## 2024-04-09 NOTE — Progress Notes (Signed)
 Established Patient Office Visit  Subjective   Patient ID: Jason Velasquez, male    DOB: 1956-07-28  Age: 68 y.o. MRN: [id number]  Chief Complaint  Patient presents with   Follow-up   Diabetes   Hypertension   Medication Refill    Test strips    Hakeen is here today to follow-up his diabetes and hypertension.  He reports he has been taking his medications with the exception of Vascepa  he has not been able to tolerate this due to nausea and vomiting has decided to stop taking it altogether.  He did undergo repeat carotid Dopplers which was noted to have significant stenosis in his right ICA he has an appointment with vascular surgery at the end of the month to discuss options.    He is also due for 5-year follow-up for his last colonoscopy.  He thinks he might of had an appointment that he missed with Dr. Avram but I cannot locate this on epic.      Objective:     BP 119/67 (BP Location: Right Arm, Patient Position: Sitting, Cuff Size: Large)   Pulse 77   Temp 98 F (36.7 C) (Oral)   Ht 5' 10.5 (1.791 m)   Wt 191 lb 3.2 oz (86.7 kg)   SpO2 99%   BMI 27.05 kg/m  BP Readings from Last 3 Encounters:  04/09/24 119/67  12/22/23 109/62  06/16/23 116/69   Wt Readings from Last 3 Encounters:  04/09/24 191 lb 3.2 oz (86.7 kg)  12/22/23 194 lb 3.2 oz (88.1 kg)  11/09/23 188 lb (85.3 kg)      Physical Exam   Results for orders placed or performed in visit on 04/09/24  Glucose, capillary  Result Value Ref Range   Glucose-Capillary 94 70 - 99 mg/dL  POC Hbg J8R  Result Value Ref Range   Hemoglobin A1C 6.2 (A) 4.0 - 5.6 %   HbA1c POC (<> result, manual entry)     HbA1c, POC (prediabetic range)     HbA1c, POC (controlled diabetic range)      Last hemoglobin A1c Lab Results  Component Value Date   HGBA1C 6.2 (A) 04/09/2024      The ASCVD Risk score (Arnett DK, et al., 2019) failed to calculate for the following reasons:   Risk score cannot be calculated because  patient has a medical history suggesting prior/existing ASCVD    Assessment & Plan:   Problem List Items Addressed This Visit       Cardiovascular and Mediastinum   Essential hypertension (Chronic)   Blood pressure is well-controlled continue lisinopril  10 mg metoprolol  200 mg Imdur  120 mg daily      DM (diabetes mellitus), type 2 with peripheral vascular complications (HCC) - Primary (Chronic)   A1c is 6.2% today he is doing very well we will plan to continue Synjardy  02-999 twice daily and Ozempic  0.5 mg weekly.      Relevant Orders   POC Hbg A1C (Completed)   Carotid stenosis   Last lipid panel was excellent but he has been unable to tolerate Vascepa .  Will plan to continue atorvastatin  80 mg daily and Plavix  75 mg.  Has appointment for vascular surgery at the end of the month.  Diabetes and hypertension are well-controlled        Other   Hx of adenomatous colonic polyps (Chronic)   Due for recall colonoscopy in 2025, thinks he may have missed an appointment with Dr Avram but I cannot see any in  EPIC. We discussed will plan to readdress this at next visit but OK to hold off until he sees vascular surgery and makes plans for carotid stenosis.       Return in about 3 months (around 07/10/2024) for DM, HTN.    [place] JAYSON Eastern, DO

## 2024-04-09 NOTE — Assessment & Plan Note (Signed)
 A1c is 6.2% today he is doing very well we will plan to continue Synjardy  02-999 twice daily and Ozempic  0.5 mg weekly.

## 2024-04-09 NOTE — Assessment & Plan Note (Signed)
 Last lipid panel was excellent but he has been unable to tolerate Vascepa .  Will plan to continue atorvastatin  80 mg daily and Plavix  75 mg.  Has appointment for vascular surgery at the end of the month.  Diabetes and hypertension are well-controlled

## 2024-04-09 NOTE — Assessment & Plan Note (Signed)
 Due for recall colonoscopy in 2025, thinks he may have missed an appointment with Dr Avram but I cannot see any in EPIC. We discussed will plan to readdress this at next visit but OK to hold off until he sees vascular surgery and makes plans for carotid stenosis.

## 2024-05-01 ENCOUNTER — Encounter: Payer: Self-pay | Admitting: Vascular Surgery

## 2024-05-01 ENCOUNTER — Ambulatory Visit: Attending: Vascular Surgery | Admitting: Vascular Surgery

## 2024-05-01 VITALS — BP 111/70 | HR 76 | Temp 98.2°F | Resp 18 | Ht 70.5 in | Wt 186.6 lb

## 2024-05-01 DIAGNOSIS — I6523 Occlusion and stenosis of bilateral carotid arteries: Secondary | ICD-10-CM

## 2024-05-01 NOTE — Progress Notes (Signed)
 Patient name: Kolbe Delmonaco MRN: [id number] DOB: May 15, 1956 Sex: male  REASON FOR CONSULT: 80-99% right carotid stenosis  HPI: Ryszard Socarras is a 68 y.o. male, with history of coronary artery disease status post CABG, hypertension, hyperlipidemia, diabetes presents for evaluation of carotid stenosis.  Had an ultrasound of his carotid on 03/23/2024 with 80 to 99% right ICA stenosis and 40 to 59% left ICA stenosis.  Patient denies any history of strokes or TIAs.  No prior neck incisions or neck surgery.  Carotid disease has been followed by his cardiologist Dr. Annabella Scarce.  Past Medical History:  Diagnosis Date   Angina, class III (HCC) 08/24/2015   Arthritis    hands (01/25/2018)   AVM (arteriovenous malformation) 08/15/2017   CAD (coronary artery disease) of artery bypass graft    CABG- 2007    Carotid stenosis 03/03/2016   40-59% RICA, 1-39% LICA, >50% RECA stenosis Repeat 12/2016.   Claudication [facility])    Controlled type 2 diabetes mellitus without complication (HCC) 12/07/2014   Dx March of 2016. GAD65 negative Anti Islet cell negative     DKA (diabetic ketoacidoses) 12/07/2014   Dyspnea    History of blood transfusion ~ 08/2017   related to LGIB   History of hiatal hernia    Hx of adenomatous colonic polyps 05/27/2015   Hypercholesterolemia    Hypertension    Hypertensive heart disease 03/03/2016   Iron deficiency anemia due to chronic blood loss suspect from AVMs on Plavix     Left nephrolithiasis 12/07/2014   Migraine    only in my 30's (01/25/2018)   Non-ST elevation (NSTEMI) myocardial infarction [facility])    Pneumonia ~ 1990   Stroke (HCC) 07/12/2014   I didn't realize I'd had one; hospital found it; denies residual on 01/25/2018   Tubular adenoma of colon 05/31/2015   Colonoscopy 05/20/15 with Dr Avram: 3 tubullar adenoma's largest 2.3cm.  Plan for repeat Colonoscopy in 2019.    Past Surgical History:  Procedure Laterality Date   CARDIAC CATHETERIZATION N/A 08/26/2015    Procedure: Left Heart Cath and Cors/Grafts Angiography;  Surgeon: Alm LELON Clay, MD;  Location: [facility] INVASIVE CV LAB;  Service: Cardiovascular;  Laterality: N/A;   CORONARY ARTERY BYPASS GRAFT  2007   CABG X3, at [facility]    CORONARY BALLOON ANGIOPLASTY N/A 05/25/2017   Procedure: CORONARY BALLOON ANGIOPLASTY;  Surgeon: Clay Alm LELON, MD;  Location: [facility] INVASIVE CV LAB;  Service: Cardiovascular;  Laterality: N/A;   CORONARY BALLOON ANGIOPLASTY N/A 01/25/2018   Procedure: CORONARY BALLOON ANGIOPLASTY;  Surgeon: Clay Alm LELON, MD;  Location: [facility] INVASIVE CV LAB;  Service: Cardiovascular;  Laterality: N/A;   CORONARY STENT INTERVENTION N/A 05/25/2017   Procedure: CORONARY STENT INTERVENTION;  Surgeon: Clay Alm LELON, MD;  Location: [facility] INVASIVE CV LAB;  Service: Cardiovascular;  Laterality: N/A;   CORONARY STENT INTERVENTION N/A 01/25/2018   Procedure: CORONARY STENT INTERVENTION;  Surgeon: Clay Alm LELON, MD;  Location: [facility] INVASIVE CV LAB;  Service: Cardiovascular;  Laterality: N/A;   ESOPHAGOGASTRODUODENOSCOPY N/A 08/12/2017   Procedure: ESOPHAGOGASTRODUODENOSCOPY (EGD);  Surgeon: Albertus Gordy HERO, MD;  Location: [facility] ENDOSCOPY;  Service: Gastroenterology;  Laterality: N/A;   LEFT HEART CATH AND CORS/GRAFTS ANGIOGRAPHY N/A 05/23/2017   Procedure: LEFT HEART CATH AND CORS/GRAFTS ANGIOGRAPHY;  Surgeon: Clay Alm LELON, MD;  Location: [facility] INVASIVE CV LAB;  Service: Cardiovascular;  Laterality: N/A;   LEFT HEART CATH AND CORS/GRAFTS ANGIOGRAPHY N/A 08/15/2017   Procedure: LEFT HEART CATH AND CORS/GRAFTS ANGIOGRAPHY;  Surgeon: Verlin Lonni BIRCH, MD;  Location: [facility] INVASIVE CV LAB;  Service: Cardiovascular;  Laterality: N/A;   LEFT HEART CATH AND CORS/GRAFTS ANGIOGRAPHY N/A 01/25/2018   Procedure: LEFT HEART CATH AND CORS/GRAFTS ANGIOGRAPHY;  Surgeon: Anner Alm ORN, MD;  Location: [facility] INVASIVE CV LAB;  Service: Cardiovascular;  Laterality: N/A;    Family History  Problem  Relation Age of Onset   Hypertension Mother    Diabetes Father    Crohn's disease Daughter    Colon cancer Neg Hx    Rectal cancer Neg Hx     SOCIAL HISTORY: Social History   Socioeconomic History   Marital status: Divorced    Spouse name: Not on file   Number of children: Not on file   Years of education: 13   Highest education level: Not on file  Occupational History   Occupation: Retired on disability  Tobacco Use   Smoking status: Former    Current packs/day: 0.00    Average packs/day: 2.0 packs/day for 38.0 years (76.0 ttl pk-yrs)    Types: Cigarettes    Start date: 10/05/1971    Quit date: 10/04/2009    Years since quitting: 14.5   Smokeless tobacco: Never  Vaping Use   Vaping status: Never Used  Substance and Sexual Activity   Alcohol  use: Not Currently    Comment: Rarely.   Drug use: No   Sexual activity: Not Currently  Other Topics Concern   Not on file  Social History Narrative   Divorced   2 children, 1 [place] 1 [place]   Former smoker rare EtOH no drugs   Epworth Sleepiness Scale = 7 (as of 07/28/2015)      Current Social History 08/07/2020       Patient lives alone in a home which is 1 story. There are 5 steps with handrail up to the entrance the patient uses.       Patient's method of transportation is personal car.      The highest level of education was some college      The patient currently, retired on disability      Identified important Relationships are: States he has people he can count on but did not wish to disclose list.       Pets : 1 indoor/outdoor Brewing technologist / Fun: Being on the computer, watching TV, keeping the lawn, shrubberies looking nice.      Current Stressors: None, learning to control any stress whatsoever.      Religious / Personal Beliefs: I believe in God. [facility])       L. Ducatte, BSN, RN-BC          Social Drivers of Health   Financial Resource Strain: Low Risk  (11/09/2023)   Overall  Financial Resource Strain (CARDIA)    Difficulty of Paying Living Expenses: Not hard at all  Food Insecurity: No Food Insecurity (11/09/2023)   Hunger Vital Sign    Worried About Running Out of Food in the Last Year: Never true    Ran Out of Food in the Last Year: Never true  Transportation Needs: No Transportation Needs (11/09/2023)   PRAPARE - Administrator, Civil Service (Medical): No    Lack of Transportation (Non-Medical): No  Physical Activity: Inactive (11/09/2023)   Exercise Vital Sign    Days of Exercise per Week: 0 days    Minutes of Exercise per Session: 0 min  Stress: No Stress Concern  Present (11/09/2023)   [employer] of Occupational Health - Occupational Stress Questionnaire    Feeling of Stress : Not at all  Social Connections: Moderately Isolated (11/09/2023)   Social Connection and Isolation Panel    Frequency of Communication with Friends and Family: More than three times a week    Frequency of Social Gatherings with Friends and Family: More than three times a week    Attends Religious Services: 1 to 4 times per year    Active Member of [employer] or Organizations: No    Attends Banker Meetings: Never    Marital Status: Divorced  Catering manager Violence: Not At Risk (11/09/2023)   Humiliation, Afraid, Rape, and Kick questionnaire    Fear of Current or Ex-Partner: No    Emotionally Abused: No    Physically Abused: No    Sexually Abused: No    No Known Allergies  Current Outpatient Medications  Medication Sig Dispense Refill   acetaminophen  (TYLENOL ) 500 MG tablet Take 1,000 mg by mouth every 6 (six) hours as needed for moderate pain or headache.     atorvastatin  (LIPITOR) 80 MG tablet Take 1 tablet (80 mg total) by mouth every evening. 90 tablet 3   clopidogrel  (PLAVIX ) 75 MG tablet Take 1 tablet by mouth once daily 90 tablet 3   Empagliflozin -metFORMIN  HCl (SYNJARDY ) 02-999 MG TABS Take 1 tablet by mouth 2 (two) times daily with a meal. 180  tablet 3   ferrous sulfate  325 (65 FE) MG tablet Take 1 tablet (325 mg total) by mouth every other day. 100 tablet 1   glucose blood (ONETOUCH VERIO) test strip Use as instructed 100 each 11   Hypromellose (ARTIFICIAL TEARS OP) Place 1 drop into both eyes daily as needed (for dry eyes).      isosorbide  mononitrate (IMDUR ) 120 MG 24 hr tablet TAKE 1/2 (ONE-HALF) TABLET BY MOUTH IN THE MORNING AND 1 TABLET IN THE EVENING 45 tablet 0   Lancets (ONETOUCH DELICA PLUS LANCET33G) MISC 1 Device by Does not apply route in the morning and at bedtime. 100 each 11   lisinopril  (ZESTRIL ) 10 MG tablet Take 1 tablet (10 mg total) by mouth daily. 90 tablet 2   metoprolol  (TOPROL -XL) 200 MG 24 hr tablet Take 1 tablet (200 mg total) by mouth daily. 90 tablet 3   nitroGLYCERIN  (NITROSTAT ) 0.4 MG SL tablet Place 1 tablet (0.4 mg total) under the tongue every 5 (five) minutes as needed for chest pain. 25 tablet 2   ranolazine  (RANEXA ) 1000 MG SR tablet TAKE 1 TABLET BY MOUTH 2 TIMES A DAY; NEW DOSE FROM DOCTOR 180 tablet 3   Semaglutide ,0.25 or 0.5MG /DOS, 2 MG/3ML SOPN Inject 0.5 mg into the skin once a week. 3 mL 5   No current facility-administered medications for this visit.    REVIEW OF SYSTEMS:  [X]  denotes positive finding, [ ]  denotes negative finding Cardiac  Comments:  Chest pain or chest pressure:    Shortness of breath upon exertion:    Short of breath when lying flat:    Irregular heart rhythm:        Vascular    Pain in calf, thigh, or hip brought on by ambulation:    Pain in feet at night that wakes you up from your sleep:     Blood clot in your veins:    Leg swelling:         Pulmonary    Oxygen at home:    Productive cough:  Wheezing:         Neurologic    Sudden weakness in arms or legs:     Sudden numbness in arms or legs:     Sudden onset of difficulty speaking or slurred speech:    Temporary loss of vision in one eye:     Problems with dizziness:         Gastrointestinal     Blood in stool:     Vomited blood:         Genitourinary    Burning when urinating:     Blood in urine:        Psychiatric    Major depression:         Hematologic    Bleeding problems:    Problems with blood clotting too easily:        Skin    Rashes or ulcers:        Constitutional    Fever or chills:      PHYSICAL EXAM: There were no vitals filed for this visit.  GENERAL: The patient is a well-nourished male, in no acute distress. The vital signs are documented above. CARDIAC: There is a regular rate and rhythm.  VASCULAR:  Bilateral radial pulses palpable PULMONARY: No respiratory distress ABDOMEN: Soft and non-tender. MUSCULOSKELETAL: There are no major deformities or cyanosis. NEUROLOGIC: No focal weakness or paresthesias are detected.  Cranial nerves II through XII grossly intact. SKIN: There are no ulcers or rashes noted. PSYCHIATRIC: The patient has a normal affect.  DATA:   Carotid Arterial Duplex Study   Patient Name:  O[place] Fant  Date of Exam:   03/23/2024  Medical Rec #: [id number]     Accession #:    [id number]  Date of Birth: March 09, 1956      Patient Gender: M  Patient Age:   58 years  Exam Location:  [address]  Procedure:      VAS US  CAROTID  Referring Phys: TIFFANY Coyanosa    ---------------------------------------------------------------------------  -----    Indications:      Carotid artery disease and patient states he has  intermittent                    blurred vision that resolves wiht eye drops. Patient  feels                    thimping in right side of neck is louder.  Risk Factors:      Hypertension, hyperlipidemia, Diabetes, past history of                     smoking, coronary artery disease, prior CVA.  Other Factors:     S/P CABG.  Comparison Study:  03/14/2023 RICA 235/96 cm/s and LICA 120/39 cm/s.   Performing Technologist: Orvil Holmes RDCS     Examination Guidelines: A complete evaluation includes B-mode  imaging,  spectral  Doppler, color Doppler, and power Doppler as needed of all accessible  portions  of each vessel. Bilateral testing is considered an integral part of a  complete  examination. Limited examinations for reoccurring indications may be  performed  as noted.     Right Carotid Findings:  +----------+--------+--------+--------+------------------+--------+           PSV cm/sEDV cm/sStenosisPlaque DescriptionComments  +----------+--------+--------+--------+------------------+--------+  CCA Prox  81      17                                          +----------+--------+--------+--------+------------------+--------+  CCA Distal74      17                                          +----------+--------+--------+--------+------------------+--------+  ICA Prox  286     130     80-99%  calcific                    +----------+--------+--------+--------+------------------+--------+  ICA Mid   252     45                                          +----------+--------+--------+--------+------------------+--------+  ECA      128     4                                           +----------+--------+--------+--------+------------------+--------+   +----------+--------+-------+----------------+-------------------+           PSV cm/sEDV cmsDescribe        Arm Pressure (mmHG)  +----------+--------+-------+----------------+-------------------+  [id number]     12     Multiphasic, WNL110                  +----------+--------+-------+----------------+-------------------+   +---------+--------+--+--------+-+---------+  VertebralPSV cm/s22EDV cm/s9Antegrade  +---------+--------+--+--------+-+---------+   Left Carotid Findings:  +----------+--------+--------+--------+------------------+--------+           PSV cm/sEDV cm/sStenosisPlaque DescriptionComments  +----------+--------+--------+--------+------------------+--------+   CCA Prox  92      14                                          +----------+--------+--------+--------+------------------+--------+  CCA Distal43      13                                          +----------+--------+--------+--------+------------------+--------+  ICA Prox  167     60      40-59%  calcific                    +----------+--------+--------+--------+------------------+--------+  ICA Mid   117     40                                          +----------+--------+--------+--------+------------------+--------+  ICA Distal49      16                                          +----------+--------+--------+--------+------------------+--------+  ECA      60      3                                           +----------+--------+--------+--------+------------------+--------+   +----------+--------+--------+----------------+-------------------+  PSV cm/sEDV cm/sDescribe        Arm Pressure (mmHG)  +----------+--------+--------+----------------+-------------------+  Dlarojcpjw02     0       Multiphasic, TWO01                   +----------+--------+--------+----------------+-------------------+   +---------+--------+--+--------+--+---------+  VertebralPSV cm/s47EDV cm/s12Antegrade  +---------+--------+--+--------+--+---------+   Summary:  Right Carotid: Velocities in the right ICA are consistent with a 80-99%                 stenosis.   Left Carotid: Velocities in the left ICA are consistent with a 40-59%  stenosis.   Vertebrals: Bilateral vertebral arteries demonstrate antegrade flow.  Subclavians: Normal flow hemodynamics were seen in bilateral subclavian               arteries.   *See table(s) above for measurements and observations.   Assessment/Plan:   68 y.o. male, with history of coronary artery disease status post CABG, hypertension, hyperlipidemia, diabetes presents for evaluation of carotid stenosis.   Had an ultrasound of his carotid on 03/23/2024 with 80 to 99% right ICA stenosis and 40 to 59% left ICA stenosis.  I reviewed his carotid duplex with him today and discussed he has a high-grade greater than 80% right ICA stenosis.  His carotid disease is asymptomatic with no recent stroke or TIA.  I discussed for asymptomatic carotid disease we recommend surgical revascularization for greater than 80% stenosis for stroke risk reduction.  I discussed the options of carotid endarterectomy versus carotid stenting with TCAR versus medical management and basic procedure details of each.  Ultimately he needs a CTA neck for further evaluation of his anatomy.  I get the impression he is leaning toward the carotid stent today with TCAR.  Need to ensure that he is an appropriate candidate for TCAR.  I discussed both of these procedures carry 1% stroke risk.  Discussed the overall goal of stroke risk reduction.  I will have him follow-up with me after CTA neck.   Lonni DOROTHA Gaskins, MD [facility] Office: [phone]

## 2024-05-02 ENCOUNTER — Other Ambulatory Visit: Payer: Self-pay | Admitting: Cardiovascular Disease

## 2024-05-05 ENCOUNTER — Encounter: Payer: Self-pay | Admitting: Internal Medicine

## 2024-05-07 ENCOUNTER — Other Ambulatory Visit: Payer: Self-pay

## 2024-05-07 DIAGNOSIS — I6523 Occlusion and stenosis of bilateral carotid arteries: Secondary | ICD-10-CM

## 2024-05-28 ENCOUNTER — Ambulatory Visit (HOSPITAL_COMMUNITY)
Admission: RE | Admit: 2024-05-28 | Discharge: 2024-05-28 | Disposition: A | Discharge status: Home / Self Care | Source: Ambulatory Visit | Attending: Vascular Surgery | Admitting: Vascular Surgery

## 2024-05-28 ENCOUNTER — Ambulatory Visit (HOSPITAL_COMMUNITY)

## 2024-05-28 ENCOUNTER — Encounter (HOSPITAL_COMMUNITY): Payer: Self-pay

## 2024-05-28 DIAGNOSIS — I6523 Occlusion and stenosis of bilateral carotid arteries: Secondary | ICD-10-CM | POA: Insufficient documentation

## 2024-05-28 MED ORDER — IOHEXOL 350 MG/ML SOLN
75.0000 mL | Freq: Once | INTRAVENOUS | Status: AC | PRN
  Administered 2024-05-28: 75 mL via INTRAVENOUS

## 2024-06-05 ENCOUNTER — Other Ambulatory Visit: Payer: Self-pay | Admitting: Cardiovascular Disease

## 2024-06-05 ENCOUNTER — Other Ambulatory Visit: Payer: Self-pay | Admitting: Internal Medicine

## 2024-06-05 NOTE — Telephone Encounter (Signed)
 Copied from CRM [id number]. Topic: Clinical - Medication Refill >> Jun 05, 2024  2:09 PM Merlynn A wrote: Medication: glucose blood (ONETOUCH VERIO) test strip, OZEMPIC   Has the patient contacted their pharmacy? Yes (Agent: If no, request that the patient contact the pharmacy for the refill. If patient does not wish to contact the pharmacy document the reason why and proceed with request.) (Agent: If yes, when and what did the pharmacy advise?)  This is the patient's preferred pharmacy:  [id number] - [place] ([place]), [place] - [address] [place] ([place]) [place] [address] Phone: [phone] Fax: [phone]   Is this the correct pharmacy for this prescription? Yes If no, delete pharmacy and type the correct one.   Has the prescription been filled recently? No  Is the patient out of the medication? Yes  Has the patient been seen for an appointment in the last year OR does the patient have an upcoming appointment? Yes  Can we respond through MyChart? Yes  Agent: Please be advised that Rx refills may take up to 3 business days. We ask that you follow-up with your pharmacy.

## 2024-06-06 ENCOUNTER — Other Ambulatory Visit (HOSPITAL_COMMUNITY): Payer: Self-pay

## 2024-06-06 MED ORDER — SEMAGLUTIDE(0.25 OR 0.5MG/DOS) 2 MG/3ML ~~LOC~~ SOPN: 0.5000 mg | PEN_INJECTOR | SUBCUTANEOUS | 1 refills | Status: DC

## 2024-06-06 MED ORDER — ONETOUCH VERIO VI STRP: ORAL_STRIP | 2 refills | Status: AC

## 2024-06-06 MED ORDER — LISINOPRIL 10 MG PO TABS
10.0000 mg | ORAL_TABLET | Freq: Every day | ORAL | 0 refills | Status: DC
  Filled 2024-06-06 (×2): qty 30, 30d supply, fill #0

## 2024-06-07 ENCOUNTER — Other Ambulatory Visit (HOSPITAL_COMMUNITY): Payer: Self-pay

## 2024-06-12 ENCOUNTER — Ambulatory Visit: Attending: Vascular Surgery | Admitting: Vascular Surgery

## 2024-06-12 ENCOUNTER — Encounter: Payer: Self-pay | Admitting: Vascular Surgery

## 2024-06-12 ENCOUNTER — Other Ambulatory Visit: Payer: Self-pay

## 2024-06-12 VITALS — BP 114/73 | HR 78 | Temp 98.4°F | Ht 70.5 in | Wt 193.0 lb

## 2024-06-12 DIAGNOSIS — I6523 Occlusion and stenosis of bilateral carotid arteries: Secondary | ICD-10-CM

## 2024-06-12 NOTE — Progress Notes (Signed)
 Patient name: Jason Velasquez MRN: [id number] DOB: [birth year] Sex: male  REASON FOR CONSULT: F/U after CTA neck for 80-99% right carotid stenosis  HPI: Jason Velasquez is a 68 y.o. male, with history of coronary artery disease status post CABG, hypertension, hyperlipidemia, diabetes presents for follow-up after CTA neck for further evaluation of his carotid artery disease. Had an ultrasound of his carotid on 03/23/2024 with 80 to 99% right ICA stenosis and 40 to 59% left ICA stenosis.  Patient denies any history of strokes or TIAs.  No prior neck incisions or neck surgery.  Carotid disease has been followed by his cardiologist Dr. Annabella Scarce.  He presents for follow-up after his CTA neck.  Past Medical History:  Diagnosis Date   Angina, class III (HCC) 08/24/2015   Arthritis    hands (01/25/2018)   AVM (arteriovenous malformation) 08/15/2017   CAD (coronary artery disease) of artery bypass graft    CABG- 2007    Carotid stenosis 03/03/2016   40-59% RICA, 1-39% LICA, >50% RECA stenosis Repeat 12/2016.   Claudication [facility])    Controlled type 2 diabetes mellitus without complication (HCC) 12/07/2014   Dx March of 2016. GAD65 negative Anti Islet cell negative     DKA (diabetic ketoacidoses) 12/07/2014   Dyspnea    History of blood transfusion ~ 08/2017   related to LGIB   History of hiatal hernia    Hx of adenomatous colonic polyps 05/27/2015   Hypercholesterolemia    Hypertension    Hypertensive heart disease 03/03/2016   Iron deficiency anemia due to chronic blood loss suspect from AVMs on Plavix     Left nephrolithiasis 12/07/2014   Migraine    only in my 30's (01/25/2018)   Non-ST elevation (NSTEMI) myocardial infarction [facility])    Pneumonia ~ 1990   Stroke (HCC) 07/12/2014   I didn't realize I'd had one; hospital found it; denies residual on 01/25/2018   Tubular adenoma of colon 05/31/2015   Colonoscopy 05/20/15 with Dr Avram: 3 tubullar adenoma's largest 2.3cm.  Plan for repeat  Colonoscopy in 2019.    Past Surgical History:  Procedure Laterality Date   CARDIAC CATHETERIZATION N/A 08/26/2015   Procedure: Left Heart Cath and Cors/Grafts Angiography;  Surgeon: Alm LELON Clay, MD;  Location: [facility] INVASIVE CV LAB;  Service: Cardiovascular;  Laterality: N/A;   CORONARY ARTERY BYPASS GRAFT  2007   CABG X3, at [facility]    CORONARY BALLOON ANGIOPLASTY N/A 05/25/2017   Procedure: CORONARY BALLOON ANGIOPLASTY;  Surgeon: Clay Alm LELON, MD;  Location: [facility] INVASIVE CV LAB;  Service: Cardiovascular;  Laterality: N/A;   CORONARY BALLOON ANGIOPLASTY N/A 01/25/2018   Procedure: CORONARY BALLOON ANGIOPLASTY;  Surgeon: Clay Alm LELON, MD;  Location: [facility] INVASIVE CV LAB;  Service: Cardiovascular;  Laterality: N/A;   CORONARY STENT INTERVENTION N/A 05/25/2017   Procedure: CORONARY STENT INTERVENTION;  Surgeon: Clay Alm LELON, MD;  Location: [facility] INVASIVE CV LAB;  Service: Cardiovascular;  Laterality: N/A;   CORONARY STENT INTERVENTION N/A 01/25/2018   Procedure: CORONARY STENT INTERVENTION;  Surgeon: Clay Alm LELON, MD;  Location: [facility] INVASIVE CV LAB;  Service: Cardiovascular;  Laterality: N/A;   ESOPHAGOGASTRODUODENOSCOPY N/A 08/12/2017   Procedure: ESOPHAGOGASTRODUODENOSCOPY (EGD);  Surgeon: Albertus Gordy HERO, MD;  Location: [facility] ENDOSCOPY;  Service: Gastroenterology;  Laterality: N/A;   LEFT HEART CATH AND CORS/GRAFTS ANGIOGRAPHY N/A 05/23/2017   Procedure: LEFT HEART CATH AND CORS/GRAFTS ANGIOGRAPHY;  Surgeon: Clay Alm LELON, MD;  Location: [facility] INVASIVE CV LAB;  Service: Cardiovascular;  Laterality: N/A;   LEFT HEART CATH AND CORS/GRAFTS ANGIOGRAPHY N/A 08/15/2017   Procedure: LEFT HEART CATH AND CORS/GRAFTS ANGIOGRAPHY;  Surgeon: Verlin Lonni BIRCH, MD;  Location: [facility] INVASIVE CV LAB;  Service: Cardiovascular;  Laterality: N/A;   LEFT HEART CATH AND CORS/GRAFTS ANGIOGRAPHY N/A 01/25/2018   Procedure: LEFT HEART CATH AND CORS/GRAFTS ANGIOGRAPHY;  Surgeon: Anner Alm ORN, MD;  Location: [facility] INVASIVE CV LAB;  Service: Cardiovascular;  Laterality: N/A;    Family History  Problem Relation Age of Onset   Hypertension Mother    Diabetes Father    Crohn's disease Daughter    Colon cancer Neg Hx    Rectal cancer Neg Hx     SOCIAL HISTORY: Social History   Socioeconomic History   Marital status: Divorced    Spouse name: Not on file   Number of children: Not on file   Years of education: 13   Highest education level: Not on file  Occupational History   Occupation: Retired on disability  Tobacco Use   Smoking status: Former    Current packs/day: 0.00    Average packs/day: 2.0 packs/day for 38.0 years (76.0 ttl pk-yrs)    Types: Cigarettes    Start date: 10/05/1971    Quit date: 10/04/2009    Years since quitting: 14.6   Smokeless tobacco: Never  Vaping Use   Vaping status: Never Used  Substance and Sexual Activity   Alcohol  use: Not Currently    Comment: Rarely.   Drug use: No   Sexual activity: Not Currently  Other Topics Concern   Not on file  Social History Narrative   Divorced   2 children, 1 [place] 1 [place]   Former smoker rare EtOH no drugs   Epworth Sleepiness Scale = 7 (as of 07/28/2015)      Current Social History 08/07/2020       Patient lives alone in a home which is 1 story. There are 5 steps with handrail up to the entrance the patient uses.       Patient's method of transportation is personal car.      The highest level of education was some college      The patient currently, retired on disability      Identified important Relationships are: States he has people he can count on but did not wish to disclose list.       Pets : 1 indoor/outdoor Brewing technologist / Fun: Being on the computer, watching TV, keeping the lawn, shrubberies looking nice.      Current Stressors: None, learning to control any stress whatsoever.      Religious / Personal Beliefs: I believe in God. [facility])       L. Ducatte, BSN,  RN-BC          Social Drivers of Health   Financial Resource Strain: Low Risk  (11/09/2023)   Overall Financial Resource Strain (CARDIA)    Difficulty of Paying Living Expenses: Not hard at all  Food Insecurity: No Food Insecurity (11/09/2023)   Hunger Vital Sign    Worried About Running Out of Food in the Last Year: Never true    Ran Out of Food in the Last Year: Never true  Transportation Needs: No Transportation Needs (11/09/2023)   PRAPARE - Administrator, Civil Service (Medical): No    Lack of Transportation (Non-Medical): No  Physical Activity: Inactive (11/09/2023)   Exercise Vital Sign  Days of Exercise per Week: 0 days    Minutes of Exercise per Session: 0 min  Stress: No Stress Concern Present (11/09/2023)   [employer] of Occupational Health - Occupational Stress Questionnaire    Feeling of Stress : Not at all  Social Connections: Moderately Isolated (11/09/2023)   Social Connection and Isolation Panel    Frequency of Communication with Friends and Family: More than three times a week    Frequency of Social Gatherings with Friends and Family: More than three times a week    Attends Religious Services: 1 to 4 times per year    Active Member of [employer] or Organizations: No    Attends Banker Meetings: Never    Marital Status: Divorced  Catering manager Violence: Not At Risk (11/09/2023)   Humiliation, Afraid, Rape, and Kick questionnaire    Fear of Current or Ex-Partner: No    Emotionally Abused: No    Physically Abused: No    Sexually Abused: No    No Known Allergies  Current Outpatient Medications  Medication Sig Dispense Refill   acetaminophen  (TYLENOL ) 500 MG tablet Take 1,000 mg by mouth every 6 (six) hours as needed for moderate pain or headache.     atorvastatin  (LIPITOR) 80 MG tablet Take 1 tablet (80 mg total) by mouth every evening. 90 tablet 3   clopidogrel  (PLAVIX ) 75 MG tablet Take 1 tablet by mouth once daily 90 tablet 3    Empagliflozin -metFORMIN  HCl (SYNJARDY ) 02-999 MG TABS Take 1 tablet by mouth 2 (two) times daily with a meal. 180 tablet 3   ferrous sulfate  325 (65 FE) MG tablet Take 1 tablet (325 mg total) by mouth every other day. 100 tablet 1   glucose blood (ONETOUCH VERIO) test strip Use as instructed 100 each 2   Hypromellose (ARTIFICIAL TEARS OP) Place 1 drop into both eyes daily as needed (for dry eyes).      isosorbide  mononitrate (IMDUR ) 120 MG 24 hr tablet TAKE 1/2 (ONE-HALF) TABLET BY MOUTH IN THE MORNING AND 1 TABLET IN THE EVENING . APPOINTMENT REQUIRED FOR FUTURE REFILLS 30 tablet 0   Lancets (ONETOUCH DELICA PLUS LANCET33G) MISC 1 Device by Does not apply route in the morning and at bedtime. 100 each 11   lisinopril  (ZESTRIL ) 10 MG tablet Take 1 tablet (10 mg total) by mouth daily. *Please call office to schedule appt with Dr Raford for further refills. [phone]* 30 tablet 0   metoprolol  (TOPROL -XL) 200 MG 24 hr tablet Take 1 tablet (200 mg total) by mouth daily. 90 tablet 3   nitroGLYCERIN  (NITROSTAT ) 0.4 MG SL tablet Place 1 tablet (0.4 mg total) under the tongue every 5 (five) minutes as needed for chest pain. 25 tablet 2   ranolazine  (RANEXA ) 1000 MG SR tablet TAKE 1 TABLET BY MOUTH 2 TIMES A DAY; NEW DOSE FROM DOCTOR 180 tablet 3   Semaglutide ,0.25 or 0.5MG /DOS, 2 MG/3ML SOPN Inject 0.5 mg into the skin once a week. 3 mL 1   No current facility-administered medications for this visit.    REVIEW OF SYSTEMS:  [X]  denotes positive finding, [ ]  denotes negative finding Cardiac  Comments:  Chest pain or chest pressure:    Shortness of breath upon exertion:    Short of breath when lying flat:    Irregular heart rhythm:        Vascular    Pain in calf, thigh, or hip brought on by ambulation:    Pain in feet at night  that wakes you up from your sleep:     Blood clot in your veins:    Leg swelling:         Pulmonary    Oxygen at home:    Productive cough:     Wheezing:          Neurologic    Sudden weakness in arms or legs:     Sudden numbness in arms or legs:     Sudden onset of difficulty speaking or slurred speech:    Temporary loss of vision in one eye:     Problems with dizziness:         Gastrointestinal    Blood in stool:     Vomited blood:         Genitourinary    Burning when urinating:     Blood in urine:        Psychiatric    Major depression:         Hematologic    Bleeding problems:    Problems with blood clotting too easily:        Skin    Rashes or ulcers:        Constitutional    Fever or chills:      PHYSICAL EXAM: There were no vitals filed for this visit.  GENERAL: The patient is a well-nourished male, in no acute distress. The vital signs are documented above. CARDIAC: There is a regular rate and rhythm.  VASCULAR:  Bilateral radial pulses palpable PULMONARY: No respiratory distress ABDOMEN: Soft and non-tender. MUSCULOSKELETAL: There are no major deformities or cyanosis. NEUROLOGIC: No focal weakness or paresthesias are detected.  Cranial nerves II through XII grossly intact. SKIN: There are no ulcers or rashes noted. PSYCHIATRIC: The patient has a normal affect.  DATA:   CTA neck 05/28/24 reviewed with high-grade right ICA stenosis >80% and at least a 60% left ICA stenosis  Carotid duplex 03/23/2024 with 80-99% right ICA stenosis and 40 to 59% left ICA stenosis  Assessment/Plan:   68 y.o. male, with history of coronary artery disease status post CABG, hypertension, hyperlipidemia, diabetes presents for follow-up after CTA neck for further evaluation of carotid stenosis.  Had an ultrasound of his carotid on 03/23/2024 with 80 to 99% right ICA stenosis and 40 to 59% left ICA stenosis.  I subsequently sent him for CTA neck.  Discussed the CTA neck does confirm a high-grade right ICA stenosis over 80%.  I discussed we recommend carotid revascularization when >80% stenosis in asymptomatic disease for stroke risk  reduction.  I have recommended carotid revascularization for stroke risk reduction.  I do think he is a candidate for carotid endarterectomy or TCAR with stenting using flow reversal and we discussed medical therapy as well.  We discussed both procedures in detail.  He does favor TCAR with a more minimally invasive approach.  I discussed basic steps for stroke risk reduction.  I did discuss risk and benefits including 1% stroke risk.  Will get scheduled [facility] next week.  Discussed need to start an 81 mg aspirin  daily in addition to his Plavix  and statin.  This should not be interrupted around surgery.  All questions answered.   Lonni DOROTHA Gaskins, MD [facility] Office: [phone]

## 2024-06-14 ENCOUNTER — Other Ambulatory Visit (INDEPENDENT_AMBULATORY_CARE_PROVIDER_SITE_OTHER): Payer: Self-pay

## 2024-06-14 DIAGNOSIS — E785 Hyperlipidemia, unspecified: Secondary | ICD-10-CM

## 2024-06-14 DIAGNOSIS — I10 Essential (primary) hypertension: Secondary | ICD-10-CM

## 2024-06-14 NOTE — Progress Notes (Signed)
 This patient is appearing on a report for being at risk of failing the adherence measure for cholesterol (statin) and hypertension (ACEi/ARB) medications this calendar year.   Medications: atorvastatin  80 mg daily, lisinopril  10 mg daily Last fill date - atorvastatin : 04/26/24 for 90 day supply - prescription expired Last fill date - lisinopril  06/07/24 for 30ds - cardiologist requires appt to continue providing refills.   Will encourage patient to schedule f/u with cardiology, and request refills of lisinopril  and atorvastatin  from PCP given that patient has been well-controlled and stable on both medications.   Lorain Baseman, PharmD [facility] [phone]

## 2024-06-19 ENCOUNTER — Encounter (HOSPITAL_COMMUNITY): Payer: Self-pay | Admitting: Vascular Surgery

## 2024-06-19 ENCOUNTER — Other Ambulatory Visit: Payer: Self-pay

## 2024-06-19 NOTE — Progress Notes (Signed)
 SDW call  Patient was given pre-op instructions over the phone. Patient verbalized understanding of instructions provided.     PCP - Dr. Dayton Eastern Cardiologist - Dr. Annabella Scarce Pulmonary:    PPM/ICD - denies Device Orders - na Rep Notified - na   Chest x-ray - na EKG -  DOS, 06/20/2024 Stress Test -08/21/2015 ECHO - 06/01/2017 Cardiac Cath - 01/25/2018  Sleep Study/sleep apnea/CPAP: denies  Type II diabetes. A1C 6.2 on 04/09/2024.   Fasting Blood sugar range: 89-145 How often check sugars: daily Synjardy , states last dose this morning. Instructed to hold this evening and morning dose  GLP1: Semaglutide , states last dose 06/10/2024   Blood Thinner Instructions: denies Aspirin  Instructions:denies   ERAS Protcol - NPO   Anesthesia review: Yes. Angina, DM, CAD, HTN, MI, stroke   Patient denies shortness of breath, fever, cough and chest pain over the phone call  Your procedure is scheduled on Wednesday June 20, 2024  Report to [facility] Main Entrance A at  0600  A.M., then check in with the Admitting office.  Call this number if you have problems the morning of surgery:  [phone]   If you have any questions prior to your surgery date call [phone]: Open Monday-Friday 8am-4pm If you experience any cold or flu symptoms such as cough, fever, chills, shortness of breath, etc. between now and your scheduled surgery, please notify us  at the above number    Remember:  Do not eat or drink after midnight the night before your surgery  Take these medicines the morning of surgery with A SIP OF WATER:  ASA, plavix , imdur , metoprolol , ranexa   As needed: Tylenol , artificial tears  As of today, STOP taking any Aleve, Naproxen, Ibuprofen, Motrin, Advil, Goody's, BC's, all herbal medications, fish oil, and all vitamins.

## 2024-06-19 NOTE — Progress Notes (Signed)
 Anesthesia Chart Review: Same day workup  68 year old male follows with cardiology for history of CAD s/p CABG 2007 ( LIMA-->LAD, SVG-->PDA, SVG-->OM1), multiple PCIs ( DES to the ostial LCx, mid LCx, and ostial LAD in 05/2017; DES to proximal and ostial LAD 01/2018), chronic stable angina, PAD, carotid stenosis, hypertension, CVA.  Last seen by Dr. Raford 02/02/2023, stable at that time, angina well-managed with current medication regimen.  Dr. Raford ordered follow-up carotid Dopplers which were done 03/23/2024 and showed 80 to 99% stenosis in the RICA and 40 to 59% stenosis in the LICA.  Patient was urgently referred to vascular surgery and subsequently seen by Dr. Gretta who recommended TCAR.  Other pertinent history includes former smoker (76 pack years, quit 2011), non-insulin -dependent DM2 (A1c 6.2 on 04/09/2024), hiatal hernia.  Patient will need day of surgery labs and evaluation.  EKG 03/18/2022: NSR.  Rate 85.  CTA neck 05/29/2023: IMPRESSION: 1. 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 80% right internal carotid artery stenosis 3. 60% left internal carotid artery stenosis  Cath and PCI 01/25/2018: CULPRIT: Ost LAD to Prox LAD lesion is 99% stenosed. A drug-eluting stent was successfully placed using a STENT SIERRA 3.00 X 15 MM. Post-dilated to 3.3 mm Post intervention, there is a 0% residual stenosis. Prox LAD to Mid LAD lesion is 100% stenosed after several septal perforators & diagonal branches. Ost Cx to Prox Cx lesion is 55% stenosed after LAD stent -- PTCA only -- Balloon angioplasty was performed using a BALLOON EMERGE MR PUSH 1.20X12. Post intervention, there is a 35% residual stenosis. Non-stenotic Ost LM to Dist LM lesion previously treated with DES - patent. Ost 1st Mrg lesion is 80% stenosed. - Unable to cross lesion with balloon. Prox Cx to Mid Cx lesion previously treated with DES - patent after ostial ISR. Mid RCA lesion is 100%  stenosed. Ost RCA to Prox RCA lesion is 70% stenosed. Known occluson of SVG-RPDA & SVG-OM LIMA and is normal in caliber. Prox Graft lesion is 20% stenosed. The graft exhibits minimal luminal irregularities. LV end diastolic pressure is moderately elevated. There is no aortic valve stenosis. Post intervention, there is a 0% residual stenosis.   Successful DES stent in the proximal and ostial LAD.  Unfortunately the stent did shift into the left main creating a somewhat culotte technique crossing the circumflex.  I was able to wire the circumflex and balloon the stent struts with a 1.2 mm balloon.  However guide support did not allow larger balloon to cross.   Unable to cross into the OM1 lesion due to tortuosity and multiple stent struts.   Plan: Based on how long the procedure was, with gentle hydration.We will monitor him overnight Zephyr band removal per protocol Continue aggressive risk factor modification and antianginal therapy.     Jason Velasquez, Jason Velasquez [facility]/Anesthesiology Phone [phone] 06/19/2024 11:24 AM

## 2024-06-19 NOTE — Anesthesia Preprocedure Evaluation (Signed)
 Anesthesia Evaluation  Patient identified by MRN, date of birth, ID band Patient awake    Reviewed: Allergy & Precautions, NPO status , Patient's Chart, lab work & pertinent test results  History of Anesthesia Complications Negative for: history of anesthetic complications  Airway Mallampati: III  TM Distance: >3 FB   Mouth opening: Limited Mouth Opening  Dental  (+) Dental Advisory Given, Edentulous Upper, Edentulous Lower   Pulmonary neg shortness of breath, neg sleep apnea, neg COPD, neg recent URI, former smoker   breath sounds clear to auscultation       Cardiovascular hypertension, Pt. on medications (-) angina + CAD, + Past MI and + Peripheral Vascular Disease  (-) CHF  Rhythm:Regular  Left ventricle: The cavity size was normal. There was mild focal    basal hypertrophy of the septum. Systolic function was vigorous.    The estimated ejection fraction was in the range of 65% to 70%.    Wall motion was normal; there were no regional wall motion    abnormalities. Doppler parameters are consistent with abnormal    left ventricular relaxation (grade 1 diastolic dysfunction).  - Aortic valve: Trileaflet; mildly thickened, mildly calcified    leaflets.    2019:  CULPRIT: Ost LAD to Prox LAD lesion is 99% stenosed.  A drug-eluting stent was successfully placed using a STENT SIERRA 3.00 X 15 MM. Post-dilated to 3.3 mm  Post intervention, there is a 0% residual stenosis.  Prox LAD to Mid LAD lesion is 100% stenosed after several septal perforators & diagonal branches.  Ost Cx to Prox Cx lesion is 55% stenosed after LAD stent --  PTCA only -- Balloon angioplasty was performed using a BALLOON EMERGE MR PUSH 1.20X12.  Post intervention, there is a 35% residual stenosis.  Non-stenotic Ost LM to Dist LM lesion previously treated with DES - patent.  Ost 1st Mrg lesion is 80% stenosed. - Unable to cross lesion with  balloon.  Prox Cx to Mid Cx lesion previously treated with DES - patent after ostial ISR.  Mid RCA lesion is 100% stenosed.  Ost RCA to Prox RCA lesion is 70% stenosed.  Known occluson of SVG-RPDA & SVG-OM  LIMA and is normal in caliber. Prox Graft lesion is 20% stenosed.  The graft exhibits minimal luminal irregularities.  LV end diastolic pressure is moderately elevated.  There is no aortic valve stenosis.  Post intervention, there is a 0% residual stenosis.   Successful DES stent in the proximal and ostial LAD.  Unfortunately the stent did shift into the left main creating a somewhat culotte technique crossing the circumflex.  I was able to wire the circumflex and balloon the stent struts with a 1.2 mm balloon.  However guide support did not allow larger balloon to cross.   Unable to cross into the OM1 lesion due to tortuosity and multiple stent struts.   Plan: Based on how long the procedure was, with gentle hydration.We will monitor him overnight Zephyr band removal per protocol Continue aggressive risk factor modification and antianginal therapy.      Neuro/Psych  Headaches, neg Seizures CVA, No Residual Symptoms    GI/Hepatic hiatal hernia,,,  Endo/Other  diabetes    Renal/GU Lab Results      Component                Value               Date  NA                       139                 12/22/2023                K                        4.9                 12/22/2023                CO2                      23                  12/22/2023                GLUCOSE                  121 (H)             12/22/2023                BUN                      12                  12/22/2023                CREATININE               1.04                12/22/2023                CALCIUM                   9.2                 12/22/2023                EGFR                     78                  12/22/2023                GFRNONAA                 68                   02/14/2020                Musculoskeletal  (+) Arthritis ,    Abdominal   Peds  Hematology  (+) Blood dyscrasia, anemia Lab Results      Component                Value               Date                      WBC                      4.0                 06/20/2024  HGB                      11.8 (L)            06/20/2024                HCT                      37.5 (L)            06/20/2024                MCV                      81.2                06/20/2024                PLT                      204                 06/20/2024              Anesthesia Other Findings   Reproductive/Obstetrics                              Anesthesia Physical Anesthesia Plan  ASA: 3  Anesthesia Plan: General   Post-op Pain Management: Ofirmev  IV (intra-op)*   Induction: Intravenous  PONV Risk Score and Plan: 3 and Ondansetron  and Dexamethasone   Airway Management Planned: Oral ETT  Additional Equipment: Arterial line  Intra-op Plan:   Post-operative Plan: Extubation in OR  Informed Consent: I have reviewed the patients History and Physical, chart, labs and discussed the procedure including the risks, benefits and alternatives for the proposed anesthesia with the patient or authorized representative who has indicated his/her understanding and acceptance.     Dental advisory given  Plan Discussed with: CRNA  Anesthesia Plan Comments: (PAT note by Lynwood Hope, PA-C: 68 year old male follows with cardiology for history of CAD s/p CABG 2007 ( LIMA-->LAD, SVG-->PDA, SVG-->OM1), multiple PCIs ( DES to the ostial LCx, mid LCx, and ostial LAD in 05/2017; DES to proximal and ostial LAD 01/2018), chronic stable angina, PAD, carotid stenosis, hypertension, CVA.  Last seen by Dr. Raford 02/02/2023, stable at that time, angina well-managed with current medication regimen.  Dr. Raford ordered follow-up carotid Dopplers which were done 03/23/2024 and showed 80  to 99% stenosis in the RICA and 40 to 59% stenosis in the LICA.  Patient was urgently referred to vascular surgery and subsequently seen by Dr. Gretta who recommended TCAR.  Other pertinent history includes former smoker (76 pack years, quit 2011), non-insulin -dependent DM2 (A1c 6.2 on 04/09/2024), hiatal hernia.  Patient will need day of surgery labs and evaluation.  EKG 03/18/2022: NSR.  Rate 85.  CTA neck 05/29/2023: IMPRESSION: 1. 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 80% right internal carotid artery stenosis 3. 60% left internal carotid artery stenosis  Cath and PCI 01/25/2018:  CULPRIT: Ost LAD to Prox LAD lesion is 99% stenosed.  A drug-eluting stent was successfully placed using a STENT SIERRA 3.00 X 15 MM. Post-dilated to 3.3 mm  Post intervention, there is a 0% residual stenosis.  Prox LAD to Mid LAD lesion is 100% stenosed after several septal perforators & diagonal branches.  Ost Cx  to Prox Cx lesion is 55% stenosed after LAD stent --  PTCA only -- Balloon angioplasty was performed using a BALLOON EMERGE MR PUSH 1.20X12.  Post intervention, there is a 35% residual stenosis.  Non-stenotic Ost LM to Dist LM lesion previously treated with DES - patent.  Ost 1st Mrg lesion is 80% stenosed. - Unable to cross lesion with balloon.  Prox Cx to Mid Cx lesion previously treated with DES - patent after ostial ISR.  Mid RCA lesion is 100% stenosed.  Ost RCA to Prox RCA lesion is 70% stenosed.  Known occluson of SVG-RPDA & SVG-OM  LIMA and is normal in caliber. Prox Graft lesion is 20% stenosed.  The graft exhibits minimal luminal irregularities.  LV end diastolic pressure is moderately elevated.  There is no aortic valve stenosis.  Post intervention, there is a 0% residual stenosis.   Successful DES stent in the proximal and ostial LAD.  Unfortunately the stent did shift into the left main creating a somewhat culotte technique  crossing the circumflex.  I was able to wire the circumflex and balloon the stent struts with a 1.2 mm balloon.  However guide support did not allow larger balloon to cross.   Unable to cross into the OM1 lesion due to tortuosity and multiple stent struts.   Plan: Based on how long the procedure was, with gentle hydration.We will monitor him overnight Zephyr band removal per protocol Continue aggressive risk factor modification and antianginal therapy.   )         Anesthesia Quick Evaluation

## 2024-06-20 ENCOUNTER — Other Ambulatory Visit: Payer: Self-pay

## 2024-06-20 ENCOUNTER — Inpatient Hospital Stay (HOSPITAL_COMMUNITY): Payer: Self-pay | Admitting: Physician Assistant

## 2024-06-20 ENCOUNTER — Encounter (HOSPITAL_COMMUNITY): Payer: Self-pay | Admitting: Vascular Surgery

## 2024-06-20 ENCOUNTER — Inpatient Hospital Stay (HOSPITAL_COMMUNITY)

## 2024-06-20 ENCOUNTER — Inpatient Hospital Stay (HOSPITAL_COMMUNITY)
Admission: RE | Admit: 2024-06-20 | Discharge: 2024-06-21 | DRG: 036 | Disposition: A | Discharge status: Home / Self Care | Attending: Vascular Surgery | Admitting: Vascular Surgery

## 2024-06-20 ENCOUNTER — Encounter (HOSPITAL_COMMUNITY)
Admission: RE | Disposition: A | Discharge status: Home / Self Care | Payer: Self-pay | Source: Home / Self Care | Attending: Vascular Surgery

## 2024-06-20 DIAGNOSIS — E1151 Type 2 diabetes mellitus with diabetic peripheral angiopathy without gangrene: Secondary | ICD-10-CM | POA: Diagnosis not present

## 2024-06-20 DIAGNOSIS — Z8701 Personal history of pneumonia (recurrent): Secondary | ICD-10-CM

## 2024-06-20 DIAGNOSIS — Z87891 Personal history of nicotine dependence: Secondary | ICD-10-CM | POA: Diagnosis not present

## 2024-06-20 DIAGNOSIS — Z7902 Long term (current) use of antithrombotics/antiplatelets: Secondary | ICD-10-CM

## 2024-06-20 DIAGNOSIS — Z7985 Long-term (current) use of injectable non-insulin antidiabetic drugs: Secondary | ICD-10-CM | POA: Diagnosis not present

## 2024-06-20 DIAGNOSIS — Z951 Presence of aortocoronary bypass graft: Secondary | ICD-10-CM | POA: Diagnosis not present

## 2024-06-20 DIAGNOSIS — Z833 Family history of diabetes mellitus: Secondary | ICD-10-CM | POA: Diagnosis not present

## 2024-06-20 DIAGNOSIS — I252 Old myocardial infarction: Secondary | ICD-10-CM

## 2024-06-20 DIAGNOSIS — I6523 Occlusion and stenosis of bilateral carotid arteries: Principal | ICD-10-CM | POA: Diagnosis present

## 2024-06-20 DIAGNOSIS — Z8673 Personal history of transient ischemic attack (TIA), and cerebral infarction without residual deficits: Secondary | ICD-10-CM | POA: Diagnosis not present

## 2024-06-20 DIAGNOSIS — I2510 Atherosclerotic heart disease of native coronary artery without angina pectoris: Secondary | ICD-10-CM

## 2024-06-20 DIAGNOSIS — Z79899 Other long term (current) drug therapy: Secondary | ICD-10-CM | POA: Diagnosis not present

## 2024-06-20 DIAGNOSIS — Z8249 Family history of ischemic heart disease and other diseases of the circulatory system: Secondary | ICD-10-CM | POA: Diagnosis not present

## 2024-06-20 DIAGNOSIS — I10 Essential (primary) hypertension: Secondary | ICD-10-CM

## 2024-06-20 DIAGNOSIS — I119 Hypertensive heart disease without heart failure: Secondary | ICD-10-CM | POA: Diagnosis not present

## 2024-06-20 DIAGNOSIS — Z860101 Personal history of adenomatous and serrated colon polyps: Secondary | ICD-10-CM

## 2024-06-20 DIAGNOSIS — Z87442 Personal history of urinary calculi: Secondary | ICD-10-CM

## 2024-06-20 DIAGNOSIS — E7800 Pure hypercholesterolemia, unspecified: Secondary | ICD-10-CM | POA: Diagnosis not present

## 2024-06-20 DIAGNOSIS — I6521 Occlusion and stenosis of right carotid artery: Secondary | ICD-10-CM | POA: Diagnosis present

## 2024-06-20 DIAGNOSIS — Z723 Lack of physical exercise: Secondary | ICD-10-CM | POA: Diagnosis not present

## 2024-06-20 DIAGNOSIS — I6529 Occlusion and stenosis of unspecified carotid artery: Principal | ICD-10-CM | POA: Diagnosis present

## 2024-06-20 HISTORY — PX: TRANSCAROTID ARTERY REVASCULARIZATIONÂ: SHX6778

## 2024-06-20 HISTORY — PX: ULTRASOUND GUIDANCE FOR VASCULAR ACCESS: SHX6516

## 2024-06-20 LAB — COMPREHENSIVE METABOLIC PANEL WITH GFR
ALT: 22 U/L (ref 0–44)
AST: 20 U/L (ref 15–41)
Albumin: 3.2 g/dL — ABNORMAL LOW (ref 3.5–5.0)
Alkaline Phosphatase: 56 U/L (ref 38–126)
Anion gap: 13 (ref 5–15)
BUN: 12 mg/dL (ref 8–23)
CO2: 21 mmol/L — ABNORMAL LOW (ref 22–32)
Calcium: 8.9 mg/dL (ref 8.9–10.3)
Chloride: 104 mmol/L (ref 98–111)
Creatinine, Ser: 1.04 mg/dL (ref 0.61–1.24)
GFR, Estimated: >60 mL/min (ref >60)
Glucose, Bld: 129 mg/dL — ABNORMAL HIGH (ref 70–99)
Potassium: 3.8 mmol/L (ref 3.5–5.1)
Sodium: 138 mmol/L (ref 135–145)
Total Bilirubin: 0.4 mg/dL (ref 0.0–1.2)
Total Protein: 6 g/dL — ABNORMAL LOW (ref 6.5–8.1)

## 2024-06-20 LAB — CBC
HCT: 37.5 % — ABNORMAL LOW (ref 39.0–52.0)
Hemoglobin: 11.8 g/dL — ABNORMAL LOW (ref 13.0–17.0)
MCH: 25.5 pg — ABNORMAL LOW (ref 26.0–34.0)
MCHC: 31.5 g/dL (ref 30.0–36.0)
MCV: 81.2 fL (ref 80.0–100.0)
Platelets: 204 K/uL (ref 150–400)
RBC: 4.62 MIL/uL (ref 4.22–5.81)
RDW: 17.5 % — ABNORMAL HIGH (ref 11.5–15.5)
WBC: 4 K/uL (ref 4.0–10.5)
nRBC: 0 % (ref 0.0–0.2)

## 2024-06-20 LAB — GLUCOSE, CAPILLARY
Glucose-Capillary: 138 mg/dL — ABNORMAL HIGH (ref 70–99)
Glucose-Capillary: 153 mg/dL — ABNORMAL HIGH (ref 70–99)
Glucose-Capillary: 152 mg/dL — ABNORMAL HIGH (ref 70–99)
Glucose-Capillary: 103 mg/dL — ABNORMAL HIGH (ref 70–99)

## 2024-06-20 LAB — TYPE AND SCREEN
ABO/RH(D): O POS
Antibody Screen: NEGATIVE

## 2024-06-20 LAB — APTT: aPTT: 34 s (ref 24–36)

## 2024-06-20 LAB — SURGICAL PCR SCREEN
MRSA, PCR: NEGATIVE
Staphylococcus aureus: POSITIVE — AB

## 2024-06-20 LAB — POCT ACTIVATED CLOTTING TIME: Activated Clotting Time: 245 s

## 2024-06-20 SURGERY — TRANSCAROTID ARTERY REVASCULARIZATION (TCAR)
Anesthesia: General | Laterality: Right

## 2024-06-20 MED ORDER — HEPARIN SODIUM (PORCINE) 1000 UNIT/ML IJ SOLN
INTRAMUSCULAR | Status: DC | PRN
Start: 2024-06-20 — End: 2024-06-20
  Administered 2024-06-20: 1000 [IU] via INTRAVENOUS
  Administered 2024-06-20: 9000 [IU] via INTRAVENOUS

## 2024-06-20 MED ORDER — ONDANSETRON HCL 4 MG/2ML IJ SOLN: 4.0000 mg | Freq: Four times a day (QID) | INTRAMUSCULAR | Status: DC | PRN

## 2024-06-20 MED ORDER — OXYCODONE HCL 5 MG PO TABS: 5.0000 mg | ORAL_TABLET | ORAL | Status: DC | PRN

## 2024-06-20 MED ORDER — MIDAZOLAM HCL 2 MG/2ML IJ SOLN
INTRAMUSCULAR | Status: AC
  Filled 2024-06-20: qty 2

## 2024-06-20 MED ORDER — HEMOSTATIC AGENTS (NO CHARGE) OPTIME
TOPICAL | Status: DC | PRN
  Administered 2024-06-20: 1 via TOPICAL

## 2024-06-20 MED ORDER — CHLORHEXIDINE GLUCONATE CLOTH 2 % EX PADS: 6.0000 | MEDICATED_PAD | Freq: Once | CUTANEOUS | Status: DC

## 2024-06-20 MED ORDER — ISOSORBIDE MONONITRATE ER 60 MG PO TB24
60.0000 mg | ORAL_TABLET | Freq: Every day | ORAL | Status: DC
  Administered 2024-06-21: 60 mg via ORAL
  Filled 2024-06-20: qty 1

## 2024-06-20 MED ORDER — LISINOPRIL 10 MG PO TABS
10.0000 mg | ORAL_TABLET | Freq: Every day | ORAL | Status: DC
Start: 2024-06-20 — End: 2024-06-21
  Administered 2024-06-20: 10 mg via ORAL
  Filled 2024-06-20: qty 1

## 2024-06-20 MED ORDER — EMPAGLIFLOZIN 10 MG PO TABS
10.0000 mg | ORAL_TABLET | Freq: Every day | ORAL | Status: DC
Start: 2024-06-20 — End: 2024-06-21
  Administered 2024-06-20 – 2024-06-21 (×2): 10 mg via ORAL
  Filled 2024-06-20 (×2): qty 1

## 2024-06-20 MED ORDER — METOPROLOL SUCCINATE ER 100 MG PO TB24
200.0000 mg | ORAL_TABLET | Freq: Every day | ORAL | Status: DC
Start: 2024-06-20 — End: 2024-06-20

## 2024-06-20 MED ORDER — PHENOL 1.4 % MT LIQD: 1.0000 | OROMUCOSAL | Status: DC | PRN

## 2024-06-20 MED ORDER — ORAL CARE MOUTH RINSE: 15.0000 mL | Freq: Once | OROMUCOSAL | Status: AC

## 2024-06-20 MED ORDER — EPHEDRINE 5 MG/ML INJ
INTRAVENOUS | Status: AC
  Filled 2024-06-20: qty 5

## 2024-06-20 MED ORDER — CEFAZOLIN SODIUM-DEXTROSE 2-4 GM/100ML-% IV SOLN
2.0000 g | Freq: Three times a day (TID) | INTRAVENOUS | Status: AC
  Administered 2024-06-20 (×2): 2 g via INTRAVENOUS
  Filled 2024-06-20 (×2): qty 100

## 2024-06-20 MED ORDER — POTASSIUM CHLORIDE CRYS ER 20 MEQ PO TBCR: 40.0000 meq | EXTENDED_RELEASE_TABLET | Freq: Every day | ORAL | Status: DC | PRN

## 2024-06-20 MED ORDER — ISOSORBIDE MONONITRATE ER 60 MG PO TB24
120.0000 mg | ORAL_TABLET | Freq: Every day | ORAL | Status: DC
Start: 2024-06-20 — End: 2024-06-21
  Administered 2024-06-20: 120 mg via ORAL
  Filled 2024-06-20: qty 2

## 2024-06-20 MED ORDER — HYDROMORPHONE HCL 1 MG/ML IJ SOLN: 0.5000 mg | INTRAMUSCULAR | Status: DC | PRN

## 2024-06-20 MED ORDER — INSULIN ASPART 100 UNIT/ML IJ SOLN
0.0000 [IU] | Freq: Three times a day (TID) | INTRAMUSCULAR | Status: DC
  Administered 2024-06-20: 3 [IU] via SUBCUTANEOUS
  Administered 2024-06-21: 2 [IU] via SUBCUTANEOUS

## 2024-06-20 MED ORDER — MIDAZOLAM HCL 2 MG/2ML IJ SOLN
INTRAMUSCULAR | Status: DC | PRN
  Administered 2024-06-20: 2 mg via INTRAVENOUS

## 2024-06-20 MED ORDER — ORAL CARE MOUTH RINSE: 15.0000 mL | OROMUCOSAL | Status: DC | PRN

## 2024-06-20 MED ORDER — CLOPIDOGREL BISULFATE 75 MG PO TABS
75.0000 mg | ORAL_TABLET | Freq: Every day | ORAL | Status: DC
  Administered 2024-06-21: 75 mg via ORAL
  Filled 2024-06-20: qty 1

## 2024-06-20 MED ORDER — DEXAMETHASONE SODIUM PHOSPHATE 10 MG/ML IJ SOLN
INTRAMUSCULAR | Status: AC
Start: 2024-06-20 — End: 2024-06-20
  Filled 2024-06-20: qty 1

## 2024-06-20 MED ORDER — ACETAMINOPHEN 650 MG RE SUPP: 325.0000 mg | RECTAL | Status: DC | PRN

## 2024-06-20 MED ORDER — CEFAZOLIN SODIUM-DEXTROSE 2-4 GM/100ML-% IV SOLN
2.0000 g | INTRAVENOUS | Status: AC
Start: 2024-06-20 — End: 2024-06-20
  Administered 2024-06-20: 2 g via INTRAVENOUS
  Filled 2024-06-20: qty 100

## 2024-06-20 MED ORDER — PHENYLEPHRINE 80 MCG/ML (10ML) SYRINGE FOR IV PUSH (FOR BLOOD PRESSURE SUPPORT)
PREFILLED_SYRINGE | INTRAVENOUS | Status: DC | PRN
  Administered 2024-06-20: 240 ug via INTRAVENOUS
  Administered 2024-06-20: 160 ug via INTRAVENOUS

## 2024-06-20 MED ORDER — ASPIRIN 81 MG PO CHEW
81.0000 mg | CHEWABLE_TABLET | Freq: Once | ORAL | Status: AC
  Administered 2024-06-20: 81 mg via ORAL
  Filled 2024-06-20: qty 1

## 2024-06-20 MED ORDER — MUPIROCIN 2 % EX OINT
1.0000 | TOPICAL_OINTMENT | Freq: Two times a day (BID) | CUTANEOUS | Status: DC
  Administered 2024-06-20 – 2024-06-21 (×2): 1 via NASAL
  Filled 2024-06-20: qty 22

## 2024-06-20 MED ORDER — METOPROLOL TARTRATE 5 MG/5ML IV SOLN: 2.5000 mg | INTRAVENOUS | Status: DC | PRN

## 2024-06-20 MED ORDER — ATORVASTATIN CALCIUM 80 MG PO TABS
80.0000 mg | ORAL_TABLET | Freq: Every evening | ORAL | Status: DC
  Administered 2024-06-20: 80 mg via ORAL
  Filled 2024-06-20: qty 1

## 2024-06-20 MED ORDER — GLYCOPYRROLATE 0.2 MG/ML IJ SOLN
INTRAMUSCULAR | Status: DC | PRN
Start: 2024-06-20 — End: 2024-06-20
  Administered 2024-06-20: .2 mg via INTRAVENOUS

## 2024-06-20 MED ORDER — LACTATED RINGERS IV SOLN
INTRAVENOUS | Status: DC
Start: 2024-06-20 — End: 2024-06-20

## 2024-06-20 MED ORDER — LABETALOL HCL 5 MG/ML IV SOLN
5.0000 mg | Freq: Once | INTRAVENOUS | Status: AC
  Administered 2024-06-20: 5 mg via INTRAVENOUS

## 2024-06-20 MED ORDER — CHLORHEXIDINE GLUCONATE CLOTH 2 % EX PADS
6.0000 | MEDICATED_PAD | Freq: Every day | CUTANEOUS | Status: DC
  Administered 2024-06-20: 6 via TOPICAL

## 2024-06-20 MED ORDER — LIDOCAINE 2% (20 MG/ML) 5 ML SYRINGE
INTRAMUSCULAR | Status: DC | PRN
  Administered 2024-06-20: 80 mg via INTRAVENOUS

## 2024-06-20 MED ORDER — ROCURONIUM BROMIDE 10 MG/ML (PF) SYRINGE
PREFILLED_SYRINGE | INTRAVENOUS | Status: DC | PRN
  Administered 2024-06-20: 80 mg via INTRAVENOUS

## 2024-06-20 MED ORDER — SODIUM CHLORIDE 0.9 % IV SOLN: INTRAVENOUS | Status: DC

## 2024-06-20 MED ORDER — SODIUM CHLORIDE 0.9 % IV SOLN
0.1500 ug/kg/min | Freq: Once | INTRAVENOUS | Status: AC
  Administered 2024-06-20: .02 ug/kg/min via INTRAVENOUS
  Filled 2024-06-20: qty 2000

## 2024-06-20 MED ORDER — OXYCODONE HCL 5 MG PO TABS: 5.0000 mg | ORAL_TABLET | Freq: Once | ORAL | Status: DC | PRN

## 2024-06-20 MED ORDER — METFORMIN HCL 500 MG PO TABS
1000.0000 mg | ORAL_TABLET | Freq: Two times a day (BID) | ORAL | Status: DC
  Administered 2024-06-20: 1000 mg via ORAL
  Filled 2024-06-20: qty 2

## 2024-06-20 MED ORDER — DEXAMETHASONE SODIUM PHOSPHATE 10 MG/ML IJ SOLN
INTRAMUSCULAR | Status: DC | PRN
Start: 2024-06-20 — End: 2024-06-20
  Administered 2024-06-20: 5 mg via INTRAVENOUS

## 2024-06-20 MED ORDER — LISINOPRIL 10 MG PO TABS
10.0000 mg | ORAL_TABLET | Freq: Every day | ORAL | 3 refills | Status: AC
Start: 2024-06-20 — End: ?

## 2024-06-20 MED ORDER — ROCURONIUM BROMIDE 10 MG/ML (PF) SYRINGE
PREFILLED_SYRINGE | INTRAVENOUS | Status: AC
  Filled 2024-06-20: qty 10

## 2024-06-20 MED ORDER — PROPOFOL 10 MG/ML IV BOLUS
INTRAVENOUS | Status: DC | PRN
  Administered 2024-06-20: 20 mg via INTRAVENOUS
  Administered 2024-06-20: 110 mg via INTRAVENOUS
  Administered 2024-06-20: 30 mg via INTRAVENOUS

## 2024-06-20 MED ORDER — PHENYLEPHRINE 80 MCG/ML (10ML) SYRINGE FOR IV PUSH (FOR BLOOD PRESSURE SUPPORT)
PREFILLED_SYRINGE | INTRAVENOUS | Status: AC
  Filled 2024-06-20: qty 10

## 2024-06-20 MED ORDER — 0.9 % SODIUM CHLORIDE (POUR BTL) OPTIME
TOPICAL | Status: DC | PRN
  Administered 2024-06-20: 1000 mL

## 2024-06-20 MED ORDER — DOCUSATE SODIUM 100 MG PO CAPS
100.0000 mg | ORAL_CAPSULE | Freq: Every day | ORAL | Status: DC
  Filled 2024-06-20: qty 1

## 2024-06-20 MED ORDER — PROPOFOL 10 MG/ML IV BOLUS
INTRAVENOUS | Status: AC
  Filled 2024-06-20: qty 20

## 2024-06-20 MED ORDER — EPHEDRINE SULFATE-NACL 50-0.9 MG/10ML-% IV SOSY
PREFILLED_SYRINGE | INTRAVENOUS | Status: DC | PRN
  Administered 2024-06-20 (×2): 10 mg via INTRAVENOUS

## 2024-06-20 MED ORDER — POLYETHYLENE GLYCOL 3350 17 G PO PACK: 17.0000 g | PACK | Freq: Every day | ORAL | Status: DC | PRN

## 2024-06-20 MED ORDER — HEPARIN 6000 UNIT IRRIGATION SOLUTION
Status: AC
  Filled 2024-06-20: qty 500

## 2024-06-20 MED ORDER — INSULIN ASPART 100 UNIT/ML IJ SOLN: 0.0000 [IU] | INTRAMUSCULAR | Status: DC | PRN

## 2024-06-20 MED ORDER — SEMAGLUTIDE(0.25 OR 0.5MG/DOS) 2 MG/3ML ~~LOC~~ SOPN: 0.5000 mg | PEN_INJECTOR | SUBCUTANEOUS | Status: DC

## 2024-06-20 MED ORDER — ONDANSETRON HCL 4 MG/2ML IJ SOLN
INTRAMUSCULAR | Status: DC | PRN
  Administered 2024-06-20: 4 mg via INTRAVENOUS

## 2024-06-20 MED ORDER — IODIXANOL 320 MG/ML IV SOLN
INTRAVENOUS | Status: DC | PRN
  Administered 2024-06-20: 12 mL via INTRA_ARTERIAL

## 2024-06-20 MED ORDER — FENTANYL CITRATE (PF) 250 MCG/5ML IJ SOLN
INTRAMUSCULAR | Status: DC | PRN
  Administered 2024-06-20: 100 ug via INTRAVENOUS
  Administered 2024-06-20: 50 ug via INTRAVENOUS

## 2024-06-20 MED ORDER — LABETALOL HCL 5 MG/ML IV SOLN: 10.0000 mg | INTRAVENOUS | Status: DC | PRN

## 2024-06-20 MED ORDER — ONDANSETRON HCL 4 MG/2ML IJ SOLN
INTRAMUSCULAR | Status: AC
  Filled 2024-06-20: qty 2

## 2024-06-20 MED ORDER — ACETAMINOPHEN 325 MG PO TABS: 325.0000 mg | ORAL_TABLET | ORAL | Status: DC | PRN

## 2024-06-20 MED ORDER — PROTAMINE SULFATE 10 MG/ML IV SOLN
INTRAVENOUS | Status: DC | PRN
  Administered 2024-06-20: 50 mg via INTRAVENOUS

## 2024-06-20 MED ORDER — ATORVASTATIN CALCIUM 80 MG PO TABS: 80.0000 mg | ORAL_TABLET | Freq: Every evening | ORAL | 3 refills | Status: AC

## 2024-06-20 MED ORDER — BISACODYL 5 MG PO TBEC: 5.0000 mg | DELAYED_RELEASE_TABLET | Freq: Every day | ORAL | Status: DC | PRN

## 2024-06-20 MED ORDER — LIDOCAINE 2% (20 MG/ML) 5 ML SYRINGE
INTRAMUSCULAR | Status: AC
  Filled 2024-06-20: qty 5

## 2024-06-20 MED ORDER — SUGAMMADEX SODIUM 200 MG/2ML IV SOLN
INTRAVENOUS | Status: DC | PRN
  Administered 2024-06-20: 350 mg via INTRAVENOUS

## 2024-06-20 MED ORDER — LABETALOL HCL 5 MG/ML IV SOLN
INTRAVENOUS | Status: AC
  Filled 2024-06-20: qty 4

## 2024-06-20 MED ORDER — FENTANYL CITRATE (PF) 100 MCG/2ML IJ SOLN: 25.0000 ug | INTRAMUSCULAR | Status: DC | PRN

## 2024-06-20 MED ORDER — SODIUM CHLORIDE 0.9 % IV SOLN
500.0000 mL | Freq: Once | INTRAVENOUS | Status: AC | PRN
  Administered 2024-06-21: 500 mL via INTRAVENOUS

## 2024-06-20 MED ORDER — LACTATED RINGERS IV SOLN: INTRAVENOUS | Status: DC | PRN

## 2024-06-20 MED ORDER — ENSURE PLUS HIGH PROTEIN PO LIQD
237.0000 mL | Freq: Two times a day (BID) | ORAL | Status: DC
  Administered 2024-06-21: 237 mL via ORAL

## 2024-06-20 MED ORDER — ASPIRIN 81 MG PO CHEW
81.0000 mg | CHEWABLE_TABLET | Freq: Every day | ORAL | Status: DC
Start: 2024-06-20 — End: 2024-06-20

## 2024-06-20 MED ORDER — ACETAMINOPHEN 10 MG/ML IV SOLN: 1000.0000 mg | Freq: Once | INTRAVENOUS | Status: DC | PRN

## 2024-06-20 MED ORDER — PHENYLEPHRINE HCL-NACL 20-0.9 MG/250ML-% IV SOLN
INTRAVENOUS | Status: DC | PRN
  Administered 2024-06-20: 25 ug/min via INTRAVENOUS

## 2024-06-20 MED ORDER — HEPARIN 6000 UNIT IRRIGATION SOLUTION
Status: DC | PRN
  Administered 2024-06-20: 1

## 2024-06-20 MED ORDER — CHLORHEXIDINE GLUCONATE 0.12 % MT SOLN
15.0000 mL | Freq: Once | OROMUCOSAL | Status: AC
  Administered 2024-06-20: 15 mL via OROMUCOSAL
  Filled 2024-06-20: qty 15

## 2024-06-20 MED ORDER — ASPIRIN 81 MG PO CHEW
81.0000 mg | CHEWABLE_TABLET | Freq: Every day | ORAL | Status: DC
  Administered 2024-06-21: 81 mg via ORAL
  Filled 2024-06-20: qty 1

## 2024-06-20 MED ORDER — EMPAGLIFLOZIN-METFORMIN HCL 5-1000 MG PO TABS: 1.0000 | ORAL_TABLET | Freq: Two times a day (BID) | ORAL | Status: DC

## 2024-06-20 MED ORDER — CLOPIDOGREL BISULFATE 75 MG PO TABS
75.0000 mg | ORAL_TABLET | Freq: Once | ORAL | Status: AC
  Administered 2024-06-20: 75 mg via ORAL
  Filled 2024-06-20: qty 1

## 2024-06-20 MED ORDER — CLOPIDOGREL BISULFATE 75 MG PO TABS
75.0000 mg | ORAL_TABLET | Freq: Every day | ORAL | Status: DC
Start: 2024-06-21 — End: 2024-06-20

## 2024-06-20 MED ORDER — RANOLAZINE ER 500 MG PO TB12
1000.0000 mg | ORAL_TABLET | Freq: Two times a day (BID) | ORAL | Status: DC
  Administered 2024-06-20 – 2024-06-21 (×2): 1000 mg via ORAL
  Filled 2024-06-20 (×2): qty 2

## 2024-06-20 MED ORDER — HYDRALAZINE HCL 20 MG/ML IJ SOLN: 5.0000 mg | INTRAMUSCULAR | Status: DC | PRN

## 2024-06-20 MED ORDER — OXYCODONE HCL 5 MG/5ML PO SOLN: 5.0000 mg | Freq: Once | ORAL | Status: DC | PRN

## 2024-06-20 MED ORDER — HEPARIN SODIUM (PORCINE) 5000 UNIT/ML IJ SOLN
5000.0000 [IU] | Freq: Three times a day (TID) | INTRAMUSCULAR | Status: DC
  Administered 2024-06-21: 5000 [IU] via SUBCUTANEOUS
  Filled 2024-06-20: qty 1

## 2024-06-20 MED ORDER — FENTANYL CITRATE (PF) 250 MCG/5ML IJ SOLN
INTRAMUSCULAR | Status: AC
  Filled 2024-06-20: qty 5

## 2024-06-20 SURGICAL SUPPLY — 39 items
BAG BANDED W/RUBBER/TAPE 36X54 (MISCELLANEOUS) ×3 IMPLANT
BAG COUNTER SPONGE SURGICOUNT (BAG) ×3 IMPLANT
CANISTER SUCTION 3000ML PPV (SUCTIONS) ×3 IMPLANT
CATH BALLN ENROUTE 6X35 (CATHETERS) IMPLANT
CLIP TI MEDIUM 6 (CLIP) ×3 IMPLANT
CLIP TI WIDE RED SMALL 6 (CLIP) ×3 IMPLANT
COVER DOME SNAP 22 D (MISCELLANEOUS) ×3 IMPLANT
COVER PROBE W GEL 5X96 (DRAPES) ×3 IMPLANT
DERMABOND ADVANCED .7 DNX12 (GAUZE/BANDAGES/DRESSINGS) ×3 IMPLANT
DRAPE FEMORAL ANGIO 80X135IN (DRAPES) ×3 IMPLANT
ELECTRODE REM PT RTRN 9FT ADLT (ELECTROSURGICAL) ×3 IMPLANT
GLOVE BIO SURGEON STRL SZ7.5 (GLOVE) ×3 IMPLANT
GOWN STRL REUS W/ TWL LRG LVL3 (GOWN DISPOSABLE) ×6 IMPLANT
GOWN STRL REUS W/ TWL XL LVL3 (GOWN DISPOSABLE) ×3 IMPLANT
GUIDEWIRE ENROUTE 0.014 (WIRE) ×3 IMPLANT
HEMOSTAT SNOW SURGICEL 2X4 (HEMOSTASIS) IMPLANT
KIT BASIN OR (CUSTOM PROCEDURE TRAY) ×3 IMPLANT
KIT ENCORE 26 ADVANTAGE (KITS) ×3 IMPLANT
KIT INTRODUCER GALT 7 (INTRODUCER) ×3 IMPLANT
KIT TURNOVER KIT B (KITS) ×3 IMPLANT
LOOP VESSEL MINI RED (MISCELLANEOUS) IMPLANT
NDL HYPO 25GX1X1/2 BEV (NEEDLE) IMPLANT
NEEDLE HYPO 25GX1X1/2 BEV (NEEDLE) IMPLANT
PACK CAROTID (CUSTOM PROCEDURE TRAY) ×3 IMPLANT
POSITIONER HEAD DONUT 9IN (MISCELLANEOUS) ×3 IMPLANT
SET MICROPUNCTURE 5F STIFF (MISCELLANEOUS) ×3 IMPLANT
STENT TRANSCAROTID SYSTEM 9X40 (Permanent Stent) IMPLANT
SUT MNCRL AB 4-0 PS2 18 (SUTURE) ×3 IMPLANT
SUT PROLENE 5 0 C 1 24 (SUTURE) ×3 IMPLANT
SUT SILK 2 0 PERMA HAND 18 BK (SUTURE) ×3 IMPLANT
SUT VIC AB 3-0 SH 27X BRD (SUTURE) ×3 IMPLANT
SYR 10ML LL (SYRINGE) ×9 IMPLANT
SYR 20ML LL LF (SYRINGE) ×3 IMPLANT
SYR CONTROL 10ML LL (SYRINGE) IMPLANT
SYSTEM ENROUTE TCAR NEURO PLUS (FILTER) IMPLANT
TAPE UMBILICAL 1/8X30 (MISCELLANEOUS) IMPLANT
TOWEL GREEN STERILE (TOWEL DISPOSABLE) ×3 IMPLANT
WATER STERILE IRR 1000ML POUR (IV SOLUTION) ×3 IMPLANT
WIRE BENTSON .035X145CM (WIRE) ×3 IMPLANT

## 2024-06-20 NOTE — Transfer of Care (Signed)
 Immediate Anesthesia Transfer of Care Note  Patient: Jason Velasquez  Procedure(s) Performed: LERETHA ARTERY REVASCULARIZATION (TCAR) (Right) ULTRASOUND GUIDANCE, FOR LEFT FEMORAL VEIN ACCESS  Patient Location: PACU  Anesthesia Type:General  Level of Consciousness: drowsy  Airway & Oxygen Therapy: Patient Spontanous Breathing and Patient connected to nasal cannula oxygen  Post-op Assessment: Report given to RN and Post -op Vital signs reviewed and stable  Post vital signs: Reviewed and stable  Last Vitals:  Vitals Value Taken Time  BP 130/85 06/20/24 10:35  Temp    Pulse 87 06/20/24 10:39  Resp 15 06/20/24 10:39  SpO2 97 % 06/20/24 10:39  Vitals shown include unfiled device data.  Last Pain:  Vitals:   06/20/24 0729  PainSc: 0-No pain         Complications: No notable events documented.

## 2024-06-20 NOTE — H&P (Signed)
 History and Physical Interval Note:  06/20/2024 8:01 AM  Jason Velasquez  has presented today for surgery, with the diagnosis of BL CAROTID STENOSIS.  The various methods of treatment have been discussed with the patient and family. After consideration of risks, benefits and other options for treatment, the patient has consented to  Procedure(s): TRANSCAROTID ARTERY REVASCULARIZATION (TCAR) (Right) as a surgical intervention.  The patient's history has been reviewed, patient examined, no change in status, stable for surgery.  I have reviewed the patient's chart and labs.  Questions were answered to the patient's satisfaction.    Right TCAR.  Risks and benefits again discussed including 1% stroke risk.  Jason Velasquez      Patient name: Jason Velasquez  MRN: [id number]        DOB: 1956-01-26            Sex: male   REASON FOR CONSULT: F/U after CTA neck for 80-99% right carotid stenosis   HPI: Jason Velasquez is a 68 y.o. male, with history of coronary artery disease status post CABG, hypertension, hyperlipidemia, diabetes presents for follow-up after CTA neck for further evaluation of his carotid artery disease. Had an ultrasound of his carotid on 03/23/2024 with 80 to 99% right ICA stenosis and 40 to 59% left ICA stenosis.  Patient denies any history of strokes or TIAs.  No prior neck incisions or neck surgery.  Carotid disease has been followed by his cardiologist Dr. Annabella Scarce.   He presents for follow-up after his CTA neck.       Past Medical History:  Diagnosis Date   Angina, class III (HCC) 08/24/2015   Arthritis      hands (01/25/2018)   AVM (arteriovenous malformation) 08/15/2017   CAD (coronary artery disease) of artery bypass graft      CABG- 2007    Carotid stenosis 03/03/2016    40-59% RICA, 1-39% LICA, >50% RECA stenosis Repeat 12/2016.   Claudication [facility])     Controlled type 2 diabetes mellitus without complication (HCC) 12/07/2014    Dx March of 2016. GAD65 negative  Anti Islet cell negative     DKA (diabetic ketoacidoses) 12/07/2014   Dyspnea     History of blood transfusion ~ 08/2017    related to LGIB   History of hiatal hernia     Hx of adenomatous colonic polyps 05/27/2015   Hypercholesterolemia     Hypertension     Hypertensive heart disease 03/03/2016   Iron deficiency anemia due to chronic blood loss suspect from AVMs on Plavix      Left nephrolithiasis 12/07/2014   Migraine      only in my 30's (01/25/2018)   Non-ST elevation (NSTEMI) myocardial infarction [facility])     Pneumonia ~ 1990   Stroke (HCC) 07/12/2014    I didn't realize I'd had one; hospital found it; denies residual on 01/25/2018   Tubular adenoma of colon 05/31/2015    Colonoscopy 05/20/15 with Dr Avram: 3 tubullar adenoma's largest 2.3cm.  Plan for repeat Colonoscopy in 2019.               Past Surgical History:  Procedure Laterality Date   CARDIAC CATHETERIZATION N/A 08/26/2015    Procedure: Left Heart Cath and Cors/Grafts Angiography;  Surgeon: Alm LELON Clay, MD;  Location: [facility] INVASIVE CV LAB;  Service: Cardiovascular;  Laterality: N/A;   CORONARY ARTERY BYPASS GRAFT   2007    CABG X3, at [facility]    CORONARY BALLOON ANGIOPLASTY N/A  05/25/2017    Procedure: CORONARY BALLOON ANGIOPLASTY;  Surgeon: Anner Alm ORN, MD;  Location: [facility] INVASIVE CV LAB;  Service: Cardiovascular;  Laterality: N/A;   CORONARY BALLOON ANGIOPLASTY N/A 01/25/2018    Procedure: CORONARY BALLOON ANGIOPLASTY;  Surgeon: Anner Alm ORN, MD;  Location: [facility] INVASIVE CV LAB;  Service: Cardiovascular;  Laterality: N/A;   CORONARY STENT INTERVENTION N/A 05/25/2017    Procedure: CORONARY STENT INTERVENTION;  Surgeon: Anner Alm ORN, MD;  Location: [facility] INVASIVE CV LAB;  Service: Cardiovascular;  Laterality: N/A;   CORONARY STENT INTERVENTION N/A 01/25/2018    Procedure: CORONARY STENT INTERVENTION;  Surgeon: Anner Alm ORN, MD;  Location: [facility] INVASIVE CV LAB;  Service: Cardiovascular;   Laterality: N/A;   ESOPHAGOGASTRODUODENOSCOPY N/A 08/12/2017    Procedure: ESOPHAGOGASTRODUODENOSCOPY (EGD);  Surgeon: Albertus Gordy HERO, MD;  Location: [facility] ENDOSCOPY;  Service: Gastroenterology;  Laterality: N/A;   LEFT HEART CATH AND CORS/GRAFTS ANGIOGRAPHY N/A 05/23/2017    Procedure: LEFT HEART CATH AND CORS/GRAFTS ANGIOGRAPHY;  Surgeon: Anner Alm ORN, MD;  Location: [facility] INVASIVE CV LAB;  Service: Cardiovascular;  Laterality: N/A;   LEFT HEART CATH AND CORS/GRAFTS ANGIOGRAPHY N/A 08/15/2017    Procedure: LEFT HEART CATH AND CORS/GRAFTS ANGIOGRAPHY;  Surgeon: Verlin Jason BIRCH, MD;  Location: [facility] INVASIVE CV LAB;  Service: Cardiovascular;  Laterality: N/A;   LEFT HEART CATH AND CORS/GRAFTS ANGIOGRAPHY N/A 01/25/2018    Procedure: LEFT HEART CATH AND CORS/GRAFTS ANGIOGRAPHY;  Surgeon: Anner Alm ORN, MD;  Location: [facility] INVASIVE CV LAB;  Service: Cardiovascular;  Laterality: N/A;               Family History  Problem Relation Age of Onset   Hypertension Mother     Diabetes Father     Crohn's disease Daughter     Colon cancer Neg Hx     Rectal cancer Neg Hx            SOCIAL HISTORY: Social History         Socioeconomic History   Marital status: Divorced      Spouse name: Not on file   Number of children: Not on file   Years of education: 13   Highest education level: Not on file  Occupational History   Occupation: Retired on disability  Tobacco Use   Smoking status: Former      Current packs/day: 0.00      Average packs/day: 2.0 packs/day for 38.0 years (76.0 ttl pk-yrs)      Types: Cigarettes      Start date: 10/05/1971      Quit date: 10/04/2009      Years since quitting: 14.6   Smokeless tobacco: Never  Vaping Use   Vaping status: Never Used  Substance and Sexual Activity   Alcohol  use: Not Currently      Comment: Rarely.   Drug use: No   Sexual activity: Not Currently  Other Topics Concern   Not on file  Social History Narrative    Divorced    2 children, 1  [place] 1 [place]    Former smoker rare EtOH no drugs    Epworth Sleepiness Scale = 7 (as of 07/28/2015)         Current Social History 08/07/2020          Patient lives alone in a home which is 1 story. There are 5 steps with handrail up to the entrance the patient uses.          Patient's method of transportation  is personal car.         The highest level of education was some college         The patient currently, retired on disability         Identified important Relationships are: States he has people he can count on but did not wish to disclose list.          Pets : 1 indoor/outdoor Chief Executive Officer / Fun: Being on the computer, watching TV, keeping the lawn, shrubberies looking nice.         Current Stressors: None, learning to control any stress whatsoever.         Religious / Personal Beliefs: I believe in God. [facility])          L. Ducatte, BSN, RN-BC               Social Drivers of Health        Financial Resource Strain: Low Risk  (11/09/2023)    Overall Financial Resource Strain (CARDIA)     Difficulty of Paying Living Expenses: Not hard at all  Food Insecurity: No Food Insecurity (11/09/2023)    Hunger Vital Sign     Worried About Running Out of Food in the Last Year: Never true     Ran Out of Food in the Last Year: Never true  Transportation Needs: No Transportation Needs (11/09/2023)    PRAPARE - Therapist, art (Medical): No     Lack of Transportation (Non-Medical): No  Physical Activity: Inactive (11/09/2023)    Exercise Vital Sign     Days of Exercise per Week: 0 days     Minutes of Exercise per Session: 0 min  Stress: No Stress Concern Present (11/09/2023)    Jason Velasquez of Occupational Health - Occupational Stress Questionnaire     Feeling of Stress : Not at all  Social Connections: Moderately Isolated (11/09/2023)    Social Connection and Isolation Panel     Frequency of Communication with Friends and Family: More  than three times a week     Frequency of Social Gatherings with Friends and Family: More than three times a week     Attends Religious Services: 1 to 4 times per year     Active Member of [employer] or Organizations: No     Attends Banker Meetings: Never     Marital Status: Divorced  Catering manager Violence: Not At Risk (11/09/2023)    Humiliation, Afraid, Rape, and Kick questionnaire     Fear of Current or Ex-Partner: No     Emotionally Abused: No     Physically Abused: No     Sexually Abused: No      Allergies  No Known Allergies           Current Outpatient Medications  Medication Sig Dispense Refill   acetaminophen  (TYLENOL ) 500 MG tablet Take 1,000 mg by mouth every 6 (six) hours as needed for moderate pain or headache.       atorvastatin  (LIPITOR ) 80 MG tablet Take 1 tablet (80 mg total) by mouth every evening. 90 tablet 3   clopidogrel  (PLAVIX ) 75 MG tablet Take 1 tablet by mouth once daily 90 tablet 3   Empagliflozin -metFORMIN  HCl (SYNJARDY ) 02-999 MG TABS Take 1 tablet by mouth 2 (two) times daily with a meal. 180 tablet 3   ferrous sulfate  325 (65 FE) MG tablet Take 1  tablet (325 mg total) by mouth every other day. 100 tablet 1   glucose blood (ONETOUCH VERIO) test strip Use as instructed 100 each 2   Hypromellose (ARTIFICIAL TEARS OP) Place 1 drop into both eyes daily as needed (for dry eyes).        isosorbide  mononitrate (IMDUR ) 120 MG 24 hr tablet TAKE 1/2 (ONE-HALF) TABLET BY MOUTH IN THE MORNING AND 1 TABLET IN THE EVENING . APPOINTMENT REQUIRED FOR FUTURE REFILLS 30 tablet 0   Lancets (ONETOUCH DELICA PLUS LANCET33G) MISC 1 Device by Does not apply route in the morning and at bedtime. 100 each 11   lisinopril  (ZESTRIL ) 10 MG tablet Take 1 tablet (10 mg total) by mouth daily. *Please call office to schedule appt with Dr Raford for further refills. [phone]* 30 tablet 0   metoprolol  (TOPROL -XL) 200 MG 24 hr tablet Take 1 tablet (200 mg total) by mouth  daily. 90 tablet 3   nitroGLYCERIN  (NITROSTAT ) 0.4 MG SL tablet Place 1 tablet (0.4 mg total) under the tongue every 5 (five) minutes as needed for chest pain. 25 tablet 2   ranolazine  (RANEXA ) 1000 MG SR tablet TAKE 1 TABLET BY MOUTH 2 TIMES A DAY; NEW DOSE FROM DOCTOR 180 tablet 3   Semaglutide ,0.25 or 0.5MG /DOS, 2 MG/3ML SOPN Inject 0.5 mg into the skin once a week. 3 mL 1      No current facility-administered medications for this visit.        REVIEW OF SYSTEMS:  [X]  denotes positive finding, [ ]  denotes negative finding Cardiac   Comments:  Chest pain or chest pressure:      Shortness of breath upon exertion:      Short of breath when lying flat:      Irregular heart rhythm:             Vascular      Pain in calf, thigh, or hip brought on by ambulation:      Pain in feet at night that wakes you up from your sleep:       Blood clot in your veins:      Leg swelling:              Pulmonary      Oxygen at home:      Productive cough:       Wheezing:              Neurologic      Sudden weakness in arms or legs:       Sudden numbness in arms or legs:       Sudden onset of difficulty speaking or slurred speech:      Temporary loss of vision in one eye:       Problems with dizziness:              Gastrointestinal      Blood in stool:       Vomited blood:              Genitourinary      Burning when urinating:       Blood in urine:             Psychiatric      Major depression:              Hematologic      Bleeding problems:      Problems with blood clotting too easily:  Skin      Rashes or ulcers:             Constitutional      Fever or chills:          PHYSICAL EXAM: There were no vitals filed for this visit.   GENERAL: The patient is a well-nourished male, in no acute distress. The vital signs are documented above. CARDIAC: There is a regular rate and rhythm.  VASCULAR:  Bilateral radial pulses palpable PULMONARY: No respiratory  distress ABDOMEN: Soft and non-tender. MUSCULOSKELETAL: There are no major deformities or cyanosis. NEUROLOGIC: No focal weakness or paresthesias are detected.  Cranial nerves II through XII grossly intact. SKIN: There are no ulcers or rashes noted. PSYCHIATRIC: The patient has a normal affect.   DATA:    CTA neck 05/28/24 reviewed with high-grade right ICA stenosis >80% and at least a 60% left ICA stenosis   Carotid duplex 03/23/2024 with 80-99% right ICA stenosis and 40 to 59% left ICA stenosis   Assessment/Plan:    68 y.o. male, with history of coronary artery disease status post CABG, hypertension, hyperlipidemia, diabetes presents for follow-up after CTA neck for further evaluation of carotid stenosis.  Had an ultrasound of his carotid on 03/23/2024 with 80 to 99% right ICA stenosis and 40 to 59% left ICA stenosis.  I subsequently sent him for CTA neck.   Discussed the CTA neck does confirm a high-grade right ICA stenosis over 80%.  I discussed we recommend carotid revascularization when >80% stenosis in asymptomatic disease for stroke risk reduction.  I have recommended carotid revascularization for stroke risk reduction.  I do think he is a candidate for carotid endarterectomy or TCAR with stenting using flow reversal and we discussed medical therapy as well.  We discussed both procedures in detail.  He does favor TCAR with a more minimally invasive approach.  I discussed basic steps for stroke risk reduction.  I did discuss risk and benefits including 1% stroke risk.  Will get scheduled [facility] next week.  Discussed need to start an 81 mg aspirin  daily in addition to his Plavix  and statin.  This should not be interrupted around surgery.  All questions answered.     Jason DOROTHA Gaskins, MD [facility] Office: [phone]

## 2024-06-20 NOTE — Op Note (Addendum)
 Date: June 20, 2024  Preoperative diagnosis: High-grade asymptomatic right ICA stenosis greater than 80%  Postoperative diagnosis: Same  Procedure: 1.  Ultrasound-guided access left common femoral vein for delivery of TCAR venous sheath 2.  Transcatheter placement of right cervical carotid artery stent including angioplasty with flow reversal for distal embolic protection (right TCAR)  Surgeon: Dr. Lonni DOROTHA Gaskins, MD  Assistant: Ahmed Holster, PA  Indications: 68 year old male seen with high-grade right internal carotid artery stenosis over 80% that is asymptomatic.  He presents today for right TCAR for stroke risk reduction with flow reversal for distal embolic protection.  Risk benefits discussed.  Assistant was needed given the complexity the case and also for access to the artery with cutdown and deploying the stent.  Findings: Ultrasound-guided access left common femoral vein for delivery of TCAR venous sheath.  Transverse incision above the right clavicle with cutdown on the common carotid artery.  This was accessed percutaneously through a pursestring and then once we were on active flow reversal the lesion was crossed antegrade and then treated with angioplasty using a 6 mm x 35 mm angioplasty balloon and stented with a 9 mm x 40 mm Enroute stent.  Widely patent stent at completion.  Anesthesia: General  Details: Patient was taken to the operating room after informed consent was obtained.  Placed on table in the supine position.  General endotracheal anesthesia was induced.  We did place a bump and turned his neck away.  I did mark the common carotid artery above the right clavicle.  The right neck and both groins were prepped and draped standard sterile fashion.  Antibiotics were given and timeout performed.  I went ahead and evaluated the left common femoral vein with ultrasound, was patent, an image was saved.  This was accessed with micro access needle placed a microwire  and micro sheath.  I then used a Bentson wire and exchanged for the TCAR venous working sheath in the left groin.  This flushed and aspirated easily.  Then turned our attention to the right neck where a transverse incision was made 1 fingerbreadth above clavicle.  Dissected down through the platysma and used wheatlander's.  Then divided between the heads of the sternocleidomastoid and visualized the common carotid artery was dissected in the base of the wound by mobilized the internal jugular vein laterally.  The vagus nerve was preserved.  We controlled this with an umbilical tape and a large vessel loop.  The patient was given 100 units per kilogram IV heparin .  ACT was checked to maintain greater than 250.  I placed a 5-0 Prolene pursestring on the anterior wall the common carotid artery.  This was accessed with a micro access needle placed a microwire and a micro sheath.  I then got a right carotid angiogram to identify the carotid bifurcation.  I advanced a J-wire and then upsized to the working arterial sheath.  I then connected this to a filter attached to the venous sheath in the groin.  We were on passive flow reversal.  Injected through the sheath again to identify the lesion in the proximal internal carotid artery over 80%.  Once we had a therapeutic ACT and a MAP above 100 we clamped the common carotid artery.  Confirmed that we had good flow and active flow reversal.  Ultimately lesion was crossed in the ICA with a wire and then this was treated with a 6 mm x 35 mm angioplasty balloon.  This was then stented with a  9 mm x 40 mm Enroute stent in the distal common carotid proximal internal carotid artery with the help of my assistant.  We allowed another 3 minutes of flow reversal.  Final imaging showed widely patent stent with with no residual stenosis.  Wires and catheters removed and we came off clamp.  Removed the arterial working sheath tied in the pursestring with good hemostasis.  Listened with  pencil Doppler and had good flow.  The wound was irrigated out and we closed this with Surgicel snow and the platysma was closed with 3-0 Vicryl the skin was closed with 4-0 Monocryl and Dermabond.  Sheath in the groin was then removed holding manual pressure for 5 minutes.  Was awakened neuro intact and taken to recovery.  Complication: None  Condition: Stable  Jason DOROTHA Gaskins, MD [facility] [place] Office: [phone]   Jason Velasquez

## 2024-06-20 NOTE — Anesthesia Procedure Notes (Signed)
 Procedure Name: Intubation Date/Time: 06/20/2024 8:48 AM  Performed by: Elby Raelene SAUNDERS, CRNAPre-anesthesia Checklist: Patient identified, Emergency Drugs available, Suction available and Patient being monitored Patient Re-evaluated:Patient Re-evaluated prior to induction Oxygen Delivery Method: Circle System Utilized Preoxygenation: Pre-oxygenation with 100% oxygen Induction Type: IV induction Ventilation: Mask ventilation without difficulty Laryngoscope Size: Mac and 4 Grade View: Grade III Tube type: Oral Tube size: 7.5 mm Number of attempts: 1 Airway Equipment and Method: Stylet and Oral airway Placement Confirmation: ETT inserted through vocal cords under direct vision, positive ETCO2 and breath sounds checked- equal and bilateral Secured at: 23 cm Tube secured with: Tape Dental Injury: Teeth and Oropharynx as per pre-operative assessment

## 2024-06-20 NOTE — Progress Notes (Signed)
 Mobility Specialist Progress Note:    06/20/24 1500  Mobility  Activity Ambulated with assistance  Level of Assistance Standby assist, set-up cues, supervision of patient - no hands on  Assistive Device None  Distance Ambulated (ft) 20 ft  Activity Response Tolerated well  Mobility Referral Yes  Mobility visit 1 Mobility  Mobility Specialist Start Time (ACUTE ONLY) 1500  Mobility Specialist Stop Time (ACUTE ONLY) 1510  Mobility Specialist Time Calculation (min) (ACUTE ONLY) 10 min   Pt received in bed, requesting assistance to bathroom. No AD, Independent after set-up. Tolerated well, asx throughout. Void successful. Returned pt to bed, no complaints. Call bell in reach.     Nimco Bivens Mobility Specialist Please contact via Special educational needs teacher or  Rehab office at [phone]

## 2024-06-20 NOTE — Plan of Care (Signed)

## 2024-06-20 NOTE — Anesthesia Procedure Notes (Signed)
 Arterial Line Insertion Start/End9/17/2025 8:08 AM, 06/20/2024 8:11 AM Performed by: Leopoldo Bruckner, MD, Zelphia Norleen HERO, CRNA, CRNA  Patient location: Pre-op. Preanesthetic checklist: patient identified, IV checked, site marked, risks and benefits discussed, surgical consent, monitors and equipment checked, pre-op evaluation, timeout performed and anesthesia consent Lidocaine  1% used for infiltration Left, radial was placed Catheter size: 20 G Hand hygiene performed   Attempts: 1 Procedure performed without using ultrasound guided technique. Following insertion, Biopatch and dressing applied. Post procedure assessment: normal  Patient tolerated the procedure well with no immediate complications.

## 2024-06-21 ENCOUNTER — Encounter (HOSPITAL_COMMUNITY): Payer: Self-pay | Admitting: Vascular Surgery

## 2024-06-21 ENCOUNTER — Other Ambulatory Visit: Payer: Self-pay

## 2024-06-21 ENCOUNTER — Other Ambulatory Visit (HOSPITAL_COMMUNITY): Payer: Self-pay

## 2024-06-21 DIAGNOSIS — I6523 Occlusion and stenosis of bilateral carotid arteries: Secondary | ICD-10-CM

## 2024-06-21 LAB — LIPID PANEL
Cholesterol: 105 mg/dL (ref 0–200)
HDL: 23 mg/dL — ABNORMAL LOW (ref >40)
LDL Cholesterol: 51 mg/dL (ref 0–99)
Total CHOL/HDL Ratio: 4.6 ratio
Triglycerides: 154 mg/dL — ABNORMAL HIGH (ref <150)
VLDL: 31 mg/dL (ref 0–40)

## 2024-06-21 LAB — CBC
HCT: 34.4 % — ABNORMAL LOW (ref 39.0–52.0)
Hemoglobin: 11.2 g/dL — ABNORMAL LOW (ref 13.0–17.0)
MCH: 26.1 pg (ref 26.0–34.0)
MCHC: 32.6 g/dL (ref 30.0–36.0)
MCV: 80.2 fL (ref 80.0–100.0)
Platelets: 226 K/uL (ref 150–400)
RBC: 4.29 MIL/uL (ref 4.22–5.81)
RDW: 17.6 % — ABNORMAL HIGH (ref 11.5–15.5)
WBC: 6.3 K/uL (ref 4.0–10.5)
nRBC: 0 % (ref 0.0–0.2)

## 2024-06-21 LAB — BASIC METABOLIC PANEL WITH GFR
Anion gap: 14 (ref 5–15)
BUN: 11 mg/dL (ref 8–23)
CO2: 21 mmol/L — ABNORMAL LOW (ref 22–32)
Calcium: 8.8 mg/dL — ABNORMAL LOW (ref 8.9–10.3)
Chloride: 102 mmol/L (ref 98–111)
Creatinine, Ser: 0.97 mg/dL (ref 0.61–1.24)
GFR, Estimated: >60 mL/min (ref >60)
Glucose, Bld: 142 mg/dL — ABNORMAL HIGH (ref 70–99)
Potassium: 3.8 mmol/L (ref 3.5–5.1)
Sodium: 137 mmol/L (ref 135–145)

## 2024-06-21 LAB — GLUCOSE, CAPILLARY: Glucose-Capillary: 125 mg/dL — ABNORMAL HIGH (ref 70–99)

## 2024-06-21 MED ORDER — OXYCODONE-ACETAMINOPHEN 5-325 MG PO TABS
1.0000 | ORAL_TABLET | Freq: Four times a day (QID) | ORAL | 0 refills | Status: AC | PRN
  Filled 2024-06-21: qty 8, 2d supply, fill #0

## 2024-06-21 MED ORDER — METOPROLOL SUCCINATE ER 100 MG PO TB24
200.0000 mg | ORAL_TABLET | Freq: Every day | ORAL | Status: DC
Start: 2024-06-21 — End: 2024-06-21

## 2024-06-21 MED ORDER — ASPIRIN 81 MG PO CHEW
81.0000 mg | CHEWABLE_TABLET | Freq: Every day | ORAL | 3 refills | Status: AC
  Filled 2024-06-21: qty 90, 90d supply, fill #0

## 2024-06-21 NOTE — Progress Notes (Signed)
 Discharge   Pt verbally understands discharge plan of care.   Review stroke risk and education included with AVS.   2 PIV removed. Pressure dressings applied.  Tele placed at nurse station.  CCMD/Darrell.  Transferring to Lounge.  TOC meds ready.

## 2024-06-21 NOTE — Progress Notes (Signed)
 RN transferring to [facility] and Discharge lounge.

## 2024-06-21 NOTE — Anesthesia Postprocedure Evaluation (Signed)
 Anesthesia Post Note  Patient: Jason Velasquez  Procedure(s) Performed: LERETHA ARTERY REVASCULARIZATION (TCAR) (Right) ULTRASOUND GUIDANCE, FOR LEFT FEMORAL VEIN ACCESS     Patient location during evaluation: PACU Anesthesia Type: General Level of consciousness: awake and alert Pain management: pain level controlled Vital Signs Assessment: post-procedure vital signs reviewed and stable Respiratory status: spontaneous breathing, nonlabored ventilation and respiratory function stable Cardiovascular status: blood pressure returned to baseline and stable Postop Assessment: no apparent nausea or vomiting Anesthetic complications: no   No notable events documented.                 Chellie Vanlue

## 2024-06-21 NOTE — Progress Notes (Signed)
   06/21/24 1137  TOC Brief Assessment  Insurance and Status Reviewed  Patient has primary care physician Yes  Home environment has been reviewed home  Prior level of function: independent  Prior/Current Home Services No current home services  Social Drivers of Health Review SDOH reviewed no interventions necessary  Readmission risk has been reviewed Yes  Transition of care needs no transition of care needs at this time    Pt s/p TCAR, stable for transition home today, no HH or DME needs noted. Pt has transporation home.

## 2024-06-21 NOTE — Discharge Summary (Signed)
 Discharge Summary     Jason Velasquez 1956/06/29 68 y.o. male  [id number]  Admission Date: 06/20/2024  Discharge Date: 06/21/2024  Physician: Gretta Lonni PARAS, MD  Admission Diagnosis: Bilateral carotid artery stenosis [I65.23] Carotid stenosis [I65.29] Carotid artery stenosis, asymptomatic, right [I65.21]   HPI:   This is a 68 y.o. male ***  Hospital Course:  The patient was admitted to the hospital and taken to the operating room on 06/20/2024 and underwent ***    Findings: ***  The pt tolerated the procedure well and was transported to the PACU in {DESC; GOOD/FAIR/POOR:18685} condition.   By POD 1, the pt neuro status ***   Recent Labs    06/20/24 0707 06/21/24 0615  NA 138 137  K 3.8 3.8  CL 104 102  CO2 21* 21*  GLUCOSE 129* 142*  BUN 12 11  CALCIUM  8.9 8.8*   Recent Labs    06/20/24 0707 06/21/24 0615  WBC 4.0 6.3  HGB 11.8* 11.2*  HCT 37.5* 34.4*  PLT 204 226   No results for input(s): INR in the last 72 hours.   Discharge Instructions     Discharge patient   Complete by: As directed    Discharge disposition: 01-Home or Self Care   Discharge patient date: 06/21/2024       Discharge Diagnosis:  Bilateral carotid artery stenosis [I65.23] Carotid stenosis [I65.29] Carotid artery stenosis, asymptomatic, right [I65.21]  Secondary Diagnosis: Patient Active Problem List   Diagnosis Date Noted   Carotid artery stenosis, asymptomatic, right 06/20/2024   Osteoarthritis of left hip 12/22/2023   Allergic rhinitis 10/16/2021   C6 radiculopathy 02/14/2020   Erectile dysfunction 07/12/2019   Benign prostatic hyperplasia 07/12/2019   Diverticulosis of sigmoid colon 03/26/2019   Angiodysplasia of stomach and duodenum    Iron deficiency anemia due to chronic blood loss suspect from AVMs on Plavix     CAD S/P percutaneous coronary angioplasty: Complex PCI with DES 8/22/218 05/25/2017   Progressive angina (HCC) 05/23/2017   Seborrheic  keratoses 11/04/2016   PVD (peripheral vascular disease) (HCC) 09/28/2016   Dyslipidemia 09/28/2016   Carotid stenosis 03/03/2016   Hypertensive heart disease 03/03/2016   Claudication (HCC) 09/02/2015   Bilateral hypertensive retinopathy 08/11/2015   Hx of adenomatous colonic polyps 05/27/2015   S/P CABG (coronary artery bypass graft) 03/28/2015   DM (diabetes mellitus), type 2 with peripheral vascular complications (HCC) 12/07/2014   Hepatic steatosis 12/07/2014   History of CVA (cerebrovascular accident) 07/12/2014   Essential hypertension 06/25/2014   Healthcare maintenance 06/25/2014   Past Medical History:  Diagnosis Date   Angina, class III (HCC) 08/24/2015   Arthritis    hands (01/25/2018)   AVM (arteriovenous malformation) 08/15/2017   CAD (coronary artery disease) of artery bypass graft    CABG- 2007    Carotid stenosis 03/03/2016   40-59% RICA, 1-39% LICA, >50% RECA stenosis Repeat 12/2016.   Claudication [facility])    Controlled type 2 diabetes mellitus without complication (HCC) 12/07/2014   Dx March of 2016. GAD65 negative Anti Islet cell negative     DKA (diabetic ketoacidoses) 12/07/2014   Dyspnea    History of blood transfusion ~ 08/2017   related to LGIB   History of hiatal hernia    Hx of adenomatous colonic polyps 05/27/2015   Hypercholesterolemia    Hypertension    Hypertensive heart disease 03/03/2016   Iron deficiency anemia due to chronic blood loss suspect from AVMs on Plavix     Left nephrolithiasis 12/07/2014  Migraine    only in my 30's (01/25/2018)   Non-ST elevation (NSTEMI) myocardial infarction [facility])    Pneumonia ~ 1990   Stroke (HCC) 07/12/2014   I didn't realize I'd had one; hospital found it; denies residual on 01/25/2018   Tubular adenoma of colon 05/31/2015   Colonoscopy 05/20/15 with Dr Avram: 3 tubullar adenoma's largest 2.3cm.  Plan for repeat Colonoscopy in 2019.    Allergies as of 06/21/2024   No Known Allergies       Medication List     PAUSE taking these medications    lisinopril  10 MG tablet Wait to take this until: June 22, 2024 Commonly known as: ZESTRIL  Take 1 tablet (10 mg total) by mouth daily. What changed: additional instructions   metoprolol  200 MG 24 hr tablet Wait to take this until: June 22, 2024 Commonly known as: TOPROL -XL Take 1 tablet (200 mg total) by mouth daily.   Synjardy  02-999 MG Tabs Wait to take this until: June 23, 2024 Generic drug: Empagliflozin -metFORMIN  HCl Take 1 tablet by mouth 2 (two) times daily with a meal.       TAKE these medications    acetaminophen  500 MG tablet Commonly known as: TYLENOL  Take 1,000 mg by mouth every 6 (six) hours as needed for moderate pain or headache.   ARTIFICIAL TEARS OP Place 1 drop into both eyes daily as needed (for dry eyes).   aspirin  81 MG chewable tablet Chew 1 tablet (81 mg total) by mouth daily.   atorvastatin  80 MG tablet Commonly known as: Lipitor  Take 1 tablet (80 mg total) by mouth every evening.   clopidogrel  75 MG tablet Commonly known as: PLAVIX  Take 1 tablet by mouth once daily   ferrous sulfate  325 (65 FE) MG tablet Take 1 tablet (325 mg total) by mouth every other day.   isosorbide  mononitrate 120 MG 24 hr tablet Commonly known as: IMDUR  TAKE 1/2 (ONE-HALF) TABLET BY MOUTH IN THE MORNING AND 1 TABLET IN THE EVENING . APPOINTMENT REQUIRED FOR FUTURE REFILLS   nitroGLYCERIN  0.4 MG SL tablet Commonly known as: NITROSTAT  Place 1 tablet (0.4 mg total) under the tongue every 5 (five) minutes as needed for chest pain.   OneTouch Delica Plus Lancet33G Misc 1 Device by Does not apply route in the morning and at bedtime.   OneTouch Verio test strip Generic drug: glucose blood Use as instructed   oxyCODONE -acetaminophen  5-325 MG tablet Commonly known as: Percocet Take 1 tablet by mouth every 6 (six) hours as needed for severe pain (pain score 7-10).   ranolazine  1000 MG SR  tablet Commonly known as: RANEXA  TAKE 1 TABLET BY MOUTH 2 TIMES A DAY; NEW DOSE FROM DOCTOR   Semaglutide (0.25 or 0.5MG /DOS) 2 MG/3ML Sopn Inject 0.5 mg into the skin once a week.         [facility] Discharge Instructions Carotid Endarterectomy (CEA)  Please refer to the following instructions for your post-procedure care. Your surgeon or physician assistant will discuss any changes with you.  Activity  You are encouraged to walk as much as you can. You can slowly return to normal activities but must avoid strenuous activity and heavy lifting until your doctor tell you it's OK. Avoid activities such as vacuuming or swinging a golf club. You can drive after one week if you are comfortable and you are no longer taking prescription pain medications. It is normal to feel tired for serval weeks after your surgery. It is also normal  to have difficulty with sleep habits, eating, and bowel movements after surgery. These will go away with time.  Bathing/Showering  You may shower after you come home. Do not soak in a bathtub, hot tub, or swim until the incision heals completely.  Incision Care  Shower every day. Clean your incision with mild soap and water. Pat the area dry with a clean towel. You do not need a bandage unless otherwise instructed. Do not apply any ointments or creams to your incision. You may have skin glue on your incision. Do not peel it off. It will come off on its own in about one week. Your incision may feel thickened and raised for several weeks after your surgery. This is normal and the skin will soften over time. For Men Only: It's OK to shave around the incision but do not shave the incision itself for 2 weeks. It is common to have numbness under your chin that could last for several months.  Diet  Resume your normal diet. There are no special food restrictions following this procedure. A low fat/low cholesterol diet is recommended for  all patients with vascular disease. In order to heal from your surgery, it is CRITICAL to get adequate nutrition. Your body requires vitamins, minerals, and protein. Vegetables are the best source of vitamins and minerals. Vegetables also provide the perfect balance of protein. Processed food has little nutritional value, so try to avoid this.  Medications  Resume taking all of your medications unless your doctor or physician assistant tells you not to.  If your incision is causing pain, you may take over-the- counter pain relievers such as acetaminophen  (Tylenol ). If you were prescribed a stronger pain medication, please be aware these medications can cause nausea and constipation.  Prevent nausea by taking the medication with a snack or meal. Avoid constipation by drinking plenty of fluids and eating foods with a high amount of fiber, such as fruits, vegetables, and grains.  Do not take Tylenol  if you are taking prescription pain medications.  Follow Up  Our office will schedule a follow up appointment 2-3 weeks following discharge.  Please call us  immediately for any of the following conditions  Increased pain, redness, drainage (pus) from your incision site. Fever of 101 degrees or higher. If you should develop stroke (slurred speech, difficulty swallowing, weakness on one side of your body, loss of vision) you should call 911 and go to the nearest emergency room.  Reduce your risk of vascular disease:  Stop smoking. If you would like help call [facility] at 1-800-QUIT-NOW ((913) 356-4060) or  at 8183709902. Manage your cholesterol Maintain a desired weight Control your diabetes Keep your blood pressure down  If you have any questions, please call the office at [phone].  Prescriptions given: 1.   Roxicet #8 No Refill 2.  Asa 81mg  daily #90 three refills  Disposition: home  Patient's condition: is Good  Follow up: 1. [facility] in 4 weeks with carotid duplex on Dr.  Gretta clinic day   Lucie Apt, PA-C [facility] [phone]   --- For [facility] Registry use ---   Modified Rankin score at D/C (0-6): 0  IV medication needed for:  1. Hypertension: No 2. Hypotension: No  Post-op Complications: No  1. Post-op CVA or TIA: No  If yes: Event classification (right eye, left eye, right cortical, left cortical, verterobasilar, other): n/a  If yes: Timing of event (intra-op, <6 hrs post-op, >=6 hrs post-op, unknown): n/a  2. CN injury:  No  If yes: CN n/a injuried   3. Myocardial infarction: No  If yes: Dx by (EKG or clinical, Troponin): n/a  4.  CHF: No  5.  Dysrhythmia (new): No  6. Wound infection: No  7. Reperfusion symptoms: No  8. Return to OR: No  If yes: return to OR for (bleeding, neurologic, other CEA incision, other): n/a  Discharge medications: Statin use:  Yes ASA use:  Yes   Beta blocker use:  Yes ACE-Inhibitor use:  Yes  ARB use:  No CCB use: No P2Y12 Antagonist use: Yes, [ x] Plavix , [ ]  Plasugrel, [ ]  Ticlopinine, [ ]  Ticagrelor, [ ]  Other, [ ]  No for medical reason, [ ]  Non-compliant, [ ]  Not-indicated Anti-coagulant use:  No, [ ]  Warfarin, [ ]  Rivaroxaban, [ ]  Dabigatran,

## 2024-06-21 NOTE — Progress Notes (Signed)
 Mobility Specialist Progress Note:    06/21/24 0849  Mobility  Activity Ambulated independently  Level of Assistance Independent  Assistive Device None  Distance Ambulated (ft) 400 ft  Activity Response Tolerated well  Mobility Referral Yes  Mobility visit 1 Mobility  Mobility Specialist Start Time (ACUTE ONLY) 0849  Mobility Specialist Stop Time (ACUTE ONLY) 0853  Mobility Specialist Time Calculation (min) (ACUTE ONLY) 4 min   Pt received in bed, agreeable to mobility session. Ambulated in hallway independently, no AD. Tolerated well, asx throughout. Returned to room, All needs met.   Ellouise Mcwhirter Mobility Specialist Please contact via Special educational needs teacher or  Rehab office at [phone]

## 2024-06-21 NOTE — Discharge Instructions (Addendum)
 [facility]  Discharge Instructions   Carotid Surgery  Please refer to the following instructions for your post-procedure care. Your surgeon or physician assistant will discuss any changes with you.  Activity  You are encouraged to walk as much as you can. You can slowly return to normal activities but must avoid strenuous activity and heavy lifting until your doctor tell you it's okay. Avoid activities such as vacuuming or swinging a golf club. You can drive after one week if you are comfortable and you are no longer taking prescription pain medications. It is normal to feel tired for serval weeks after your surgery. It is also normal to have difficulty with sleep habits, eating, and bowel movements after surgery. These will go away with time.  Bathing/Showering  Shower daily after you go home. Do not soak in a bathtub, hot tub, or swim until the incision heals completely.  Incision Care  Shower every day. Clean your incision with mild soap and water. Pat the area dry with a clean towel. You do not need a bandage unless otherwise instructed. Do not apply any ointments or creams to your incision. You may have skin glue on your incision. Do not peel it off. It will come off on its own in about one week. Your incision may feel thickened and raised for several weeks after your surgery. This is normal and the skin will soften over time.   For Men Only: It's okay to shave around the incision but do not shave the incision itself for 2 weeks. It is common to have numbness under your chin that could last for several months.  Diet  Resume your normal diet. There are no special food restrictions following this procedure. A low fat/low cholesterol diet is recommended for all patients with vascular disease. In order to heal from your surgery, it is CRITICAL to get adequate nutrition. Your body requires vitamins, minerals, and protein. Vegetables are the best source of  vitamins and minerals. Vegetables also provide the perfect balance of protein. Processed food has little nutritional value, so try to avoid this.  Medications  Resume taking all of your medications unless your doctor or physician assistant tells you not to. If your incision is causing pain, you may take over-the- counter pain relievers such as acetaminophen  (Tylenol ). If you were prescribed a stronger pain medication, please be aware these medications can cause nausea and constipation. Prevent nausea by taking the medication with a snack or meal. Avoid constipation by drinking plenty of fluids and eating foods with a high amount of fiber, such as fruits, vegetables, and grains.  Do not take Tylenol  if you are taking prescription pain medications.  Restart Coreg  and Lisinopril  06/22/2024  Hold taking Synjardy  and resume on 06/23/2024 since you received IV contrast.  Follow Up  Our office will schedule a follow up appointment 2-3 weeks following discharge.  Please call us  immediately for any of the following conditions  Increased pain, redness, drainage (pus) from your incision site. Fever of 101 degrees or higher. If you should develop stroke (slurred speech, difficulty swallowing, weakness on one side of your body, loss of vision) you should call 911 and go to the nearest emergency room.  Reduce your risk of vascular disease:  Stop smoking. If you would like help call [facility] at 1-800-QUIT-NOW (747-338-7716) or [place] at [phone]. Manage your cholesterol Maintain a desired weight Control your diabetes Keep your blood pressure down  If you have any questions,  please call the office at [phone].

## 2024-06-21 NOTE — Progress Notes (Addendum)
 Progress Note    06/21/2024 7:02 AM 1 Day Post-Op  Subjective:  feels good this morning.  He has been able to void.  He has not walked yet.  He does not have any pain.  He is not having any difficulty swallowing.   Tm 99.3  HR 70's-90's NSR 90's-140's systolic 100% RA  Gtts:  none  Vitals:   06/20/24 2200 06/21/24 0000  BP:  (!) 142/65  Pulse: 76 94  Resp: 16 20  Temp:  99.3 F (37.4 C)  SpO2: 96% 100%     Physical Exam: Neuro:  in tact; tongue midline Lungs:  non labored Incision:  clean and dry   CBC    Component Value Date/Time   WBC 6.3 06/21/2024 0615   RBC 4.29 06/21/2024 0615   HGB 11.2 (L) 06/21/2024 0615   HGB 13.0 12/22/2023 0937   HCT 34.4 (L) 06/21/2024 0615   HCT 40.7 12/22/2023 0937   PLT 226 06/21/2024 0615   PLT 252 12/22/2023 0937   MCV 80.2 06/21/2024 0615   MCV 80 12/22/2023 0937   MCH 26.1 06/21/2024 0615   MCHC 32.6 06/21/2024 0615   RDW 17.6 (H) 06/21/2024 0615   RDW 16.7 (H) 12/22/2023 0937   LYMPHSABS 2.2 02/21/2019 1522   LYMPHSABS 1.6 09/21/2018 1141   MONOABS 0.6 02/21/2019 1522   EOSABS 0.1 02/21/2019 1522   EOSABS 0.1 09/21/2018 1141   BASOSABS 0.1 02/21/2019 1522   BASOSABS 0.0 09/21/2018 1141    BMET    Component Value Date/Time   NA 138 06/20/2024 0707   NA 139 12/22/2023 0937   K 3.8 06/20/2024 0707   CL 104 06/20/2024 0707   CO2 21 (L) 06/20/2024 0707   GLUCOSE 129 (H) 06/20/2024 0707   BUN 12 06/20/2024 0707   BUN 12 12/22/2023 0937   CREATININE 1.04 06/20/2024 0707   CREATININE 0.99 08/25/2015 1046   CALCIUM  8.9 06/20/2024 0707   GFRNONAA >60 06/20/2024 0707   GFRNONAA >89 01/07/2015 1207   GFRAA 78 02/14/2020 1103   GFRAA >89 01/07/2015 1207     Intake/Output Summary (Last 24 hours) at 06/21/2024 0702 Last data filed at 06/21/2024 0542 Gross per 24 hour  Intake 2550 ml  Output 825 ml  Net 1725 ml     Assessment/Plan:  This is a 68 y.o. male who is s/p left TCAR for asx carotid artery stenosis  1  Day Post-Op  -pt is doing well this am.  Neuro in tact.  His BP is a little soft and received a fluid bolus this am.   He is on BB and ACEI PTA.   Dr. Gretta checked on pt yesterday afternoon and pt had systolic BP of 200.  Will restart his antihypertensives tomorrow.  Would follow up with Dr. Raford for post hospitalization visit.  -hgb stable; BMP pending -pt neuro exam is in tact -pt has not ambulated -pt has voided -f/u with [facility] in 4 weeks with carotid duplex.    Lucie Apt, PA-C [facility] [phone]   I have seen and evaluated the patient. I agree with the PA note as documented above.  Postop day 1 status post right TCAR for an asymptomatic high-grade stenosis.  Looks good this morning and grossly neurologically intact.  Blood pressure is better as he had 200 systolics on the floor postop yesterday evening.  Neck looks great.  Discussed plan for aspirin  statin Plavix  for stent patency.  As long as he does okay this  morning hopefully discharge later today and follow-up in 1 month with carotid duplex.  Lonni DOROTHA Gaskins, MD [facility] [place] Office: [phone]

## 2024-06-22 ENCOUNTER — Telehealth: Payer: Self-pay

## 2024-06-22 NOTE — Transitions of Care (Post Inpatient/ED Visit) (Signed)
   06/22/2024  Name: Jason Velasquez MRN: [id number] DOB: [birth year]  Today's TOC FU Call Status: Today's TOC FU Call Status:: Unsuccessful Call (1st Attempt) Unsuccessful Call (1st Attempt) Date: 06/22/24  Attempted to reach the patient regarding the most recent Inpatient/ED visit.  Follow Up Plan: Additional outreach attempts will be made to reach the patient to complete the Transitions of Care (Post Inpatient/ED visit) call.   Medford Balboa, BSN, RN [place]  VBCI - [employer] [facility] RN Care Manager [phone]

## 2024-06-25 ENCOUNTER — Other Ambulatory Visit: Payer: Self-pay

## 2024-06-25 ENCOUNTER — Telehealth: Payer: Self-pay

## 2024-06-25 NOTE — Addendum Note (Signed)
 Addended by: CAMMIE GAETANA DEL on: 06/25/2024 03:57 PM   Modules accepted: Orders

## 2024-06-25 NOTE — Transitions of Care (Post Inpatient/ED Visit) (Signed)
 06/25/2024  Name: Jason Velasquez MRN: [id number] DOB: [birth year]  Today's TOC FU Call Status: Today's TOC FU Call Status:: Successful TOC FU Call Completed TOC FU Call Complete Date: 06/25/24 Patient's Name and Date of Birth confirmed.  Transition Care Management Follow-up Telephone Call Date of Discharge: 06/21/24 Discharge Facility: [facility] Type of Discharge: Inpatient Admission Primary Inpatient Discharge Diagnosis:: TCAR How have you been since you were released from the hospital?: Better Any questions or concerns?: Yes Patient Questions/Concerns:: Need a refill on Metoprolol  Patient Questions/Concerns Addressed: Notified Provider of Patient Questions/Concerns  Items Reviewed: Did you receive and understand the discharge instructions provided?: Yes Medications obtained,verified, and reconciled?: Yes (Medications Reviewed) Any new allergies since your discharge?: No Dietary orders reviewed?: Yes Type of Diet Ordered:: Low Sodium Heart Healthy Do you have support at home?: Yes People in Home [RPT]: friend(s) Name of Support/Comfort Primary Source: Darby Broker  Medications Reviewed Today: Medications Reviewed Today     Reviewed by Moises Reusing, RN (Case Manager) on 06/25/24 at 1401  Med List Status: <None>   Medication Order Taking? Sig Documenting Provider Last Dose Status Informant  acetaminophen  (TYLENOL ) 500 MG tablet [id number] No Take 1,000 mg by mouth every 6 (six) hours as needed for moderate pain or headache. [provider] Past Month Active Self  aspirin  81 MG chewable tablet [id number]  Chew 1 tablet (81 mg total) by mouth daily. Rhyne, Samantha J, PA-C  Active   atorvastatin  (LIPITOR ) 80 MG tablet [id number]  Take 1 tablet (80 mg total) by mouth every evening. Rosan Dayton BROCKS, DO  Active   clopidogrel  (PLAVIX ) 75 MG tablet [id number] No Take 1 tablet by mouth once daily Raford Riggs, MD 06/19/2024 Active Self  Empagliflozin -metFORMIN  HCl  (SYNJARDY ) 02-999 MG TABS [id number] No Take 1 tablet by mouth 2 (two) times daily with a meal. Rosan Dayton BROCKS, DO 06/19/2024 Active Self  ferrous sulfate  325 (65 FE) MG tablet [id number] No Take 1 tablet (325 mg total) by mouth every other day. Rosan Dayton BROCKS, DO Past Week Expired 06/20/24 2359 Self  glucose blood (ONETOUCH VERIO) test strip [id number]  Use as instructed Rosan Dayton BROCKS, DO  Active Self  Hypromellose (ARTIFICIAL TEARS OP) [id number]  Place 1 drop into both eyes daily as needed (for dry eyes).  [provider]  Active Self  isosorbide  mononitrate (IMDUR ) 120 MG 24 hr tablet [id number] No TAKE 1/2 (ONE-HALF) TABLET BY MOUTH IN THE MORNING AND 1 TABLET IN THE EVENING . APPOINTMENT REQUIRED FOR FUTURE REFILLS Raford Riggs, MD 06/19/2024 Active Self  Lancets [facility] DELICA PLUS [place]) MISC [id number]  1 Device by Does not apply route in the morning and at bedtime. Rosan Dayton BROCKS, DO  Active Self  lisinopril  (ZESTRIL ) 10 MG tablet [id number]  Take 1 tablet (10 mg total) by mouth daily. Rosan Dayton BROCKS, DO  Active   metoprolol  (TOPROL -XL) 200 MG 24 hr tablet [id number] No Take 1 tablet (200 mg total) by mouth daily. Rosan Dayton BROCKS, DO 06/19/2024 Active Self  nitroGLYCERIN  (NITROSTAT ) 0.4 MG SL tablet [id number]  Place 1 tablet (0.4 mg total) under the tongue every 5 (five) minutes as needed for chest pain. Jerilynn Lamarr HERO, NP  Active Self           Med Note SOILA LYLE BROCKS Pablo Jun 18, 2024 11:55 AM)    oxyCODONE -acetaminophen  (PERCOCET) 5-325 MG tablet [id number]  Take 1 tablet by mouth every 6 (six) hours as needed for severe pain (pain score  7-10). Rhyne, Samantha J, PA-C  Active   ranolazine  (RANEXA ) 1000 MG SR tablet [id number] No TAKE 1 TABLET BY MOUTH 2 TIMES A DAY; NEW DOSE FROM KENDALL Scarce, [place], MD 06/19/2024 Active Self  Semaglutide ,0.25 or 0.5MG /DOS, 2 MG/3ML SOPN [id number] No Inject 0.5 mg into the skin once a week. Rosan Dayton BROCKS, DO 06/09/2024 Active Self             Home Care and Equipment/Supplies: Were Home Health Services Ordered?: NA Any new equipment or medical supplies ordered?: NA  Functional Questionnaire: Do you need assistance with bathing/showering or dressing?: No Do you need assistance with meal preparation?: No Do you need assistance with eating?: No Do you have difficulty maintaining continence: No Do you need assistance with getting out of bed/getting out of a chair/moving?: No Do you have difficulty managing or taking your medications?: No  Follow up appointments reviewed: PCP Follow-up appointment confirmed?: No (CM sent message to PCP office) MD Provider Line [phone] Given: No Specialist Hospital Follow-up appointment confirmed?: Yes Date of Specialist follow-up appointment?: 07/24/24 Follow-Up Specialty Provider:: [facility] Do you need transportation to your follow-up appointment?: No Do you understand care options if your condition(s) worsen?: Yes-patient verbalized understanding  SDOH Interventions Today    Flowsheet Row Most Recent Value  SDOH Interventions   Food Insecurity Interventions Intervention Not Indicated  Housing Interventions Intervention Not Indicated  Transportation Interventions Intervention Not Indicated  Utilities Interventions Intervention Not Indicated    Medford Balboa, BSN, RN [place]  VBCI - Population Health RN Care Manager [phone]

## 2024-06-25 NOTE — Telephone Encounter (Signed)
 I called the patient to see which pharmacy he would like us  to send the medication to. Unable to reach the patient. I lvm for him to give us  a call back.

## 2024-06-25 NOTE — Telephone Encounter (Signed)
 Homsher, Christine, RN  P Imp Clinical Pool During [facility] call today the patient states his Metoprolol  200mg  XL tablet has expired and he is unable to refill this medication.  Medford Balboa, BSN, RN [place]  VBCI - [employer] [facility] RN Care Manager [phone]

## 2024-06-25 NOTE — Telephone Encounter (Signed)
-----   Message from [facility] sent at 06/25/2024  3:34 PM EDT ----- During [facility] call today the patient states his Metoprolol  200mg  XL tablet has expired and he is unable to refill this medication.   Medford Balboa, BSN, RN [place]  VBCI - [employer] [facility] RN Care Manager [phone]

## 2024-06-26 MED ORDER — METOPROLOL SUCCINATE ER 200 MG PO TB24: 200.0000 mg | ORAL_TABLET | Freq: Every day | ORAL | 3 refills | Status: AC

## 2024-06-26 NOTE — Telephone Encounter (Signed)
 Medication sent to pharmacy

## 2024-06-28 ENCOUNTER — Telehealth: Payer: Self-pay

## 2024-06-28 NOTE — Transitions of Care (Post Inpatient/ED Visit) (Signed)
   06/28/2024  Name: Jason Velasquez MRN: [id number] DOB: [birth year]  Today's TOC FU Call Status:   Patient's Name and Date of Birth confirmed.  Transition Care Management Follow-up Telephone Call   Returned the patient's call from an old Voicemail. Reviewed his meds and encouraged him to make and appointment with his PCP. Reviewed TOC Program and he declined again. States he is doing well. He was out mowing the grass today      Medford Balboa, BSN, RN [place]  VBCI - [facility] RN Care Manager [phone]

## 2024-06-29 ENCOUNTER — Other Ambulatory Visit (HOSPITAL_COMMUNITY): Payer: Self-pay

## 2024-07-24 ENCOUNTER — Encounter

## 2024-07-24 ENCOUNTER — Encounter (HOSPITAL_COMMUNITY)

## 2024-08-01 ENCOUNTER — Ambulatory Visit (HOSPITAL_COMMUNITY)
Admission: RE | Admit: 2024-08-01 | Discharge: 2024-08-01 | Disposition: A | Discharge status: Home / Self Care | Source: Ambulatory Visit | Attending: Vascular Surgery | Admitting: Vascular Surgery

## 2024-08-01 ENCOUNTER — Ambulatory Visit (INDEPENDENT_AMBULATORY_CARE_PROVIDER_SITE_OTHER): Admitting: Physician Assistant

## 2024-08-01 ENCOUNTER — Encounter: Payer: Self-pay | Admitting: Physician Assistant

## 2024-08-01 ENCOUNTER — Other Ambulatory Visit: Payer: Self-pay

## 2024-08-01 VITALS — BP 134/86 | HR 77 | Temp 97.9°F | Wt 193.6 lb

## 2024-08-01 DIAGNOSIS — I6523 Occlusion and stenosis of bilateral carotid arteries: Secondary | ICD-10-CM

## 2024-08-01 MED ORDER — SEMAGLUTIDE(0.25 OR 0.5MG/DOS) 2 MG/3ML ~~LOC~~ SOPN: 0.5000 mg | PEN_INJECTOR | SUBCUTANEOUS | 1 refills | Status: DC

## 2024-08-01 NOTE — Progress Notes (Signed)
  TCAR post operative follow up    CC:  follow up. Requesting Provider:  Rosan Dayton BROCKS, DO  HPI: This is a 68 y.o. male here for follow up for carotid artery stenosis.  Pt is s/p right TCAR 06/20/2024 by Dr. Gretta for asymptomatic carotid artery stenosis   Pt returns today for follow up.    Pt denies any amaurosis fugax, speech difficulties, weakness, numbness, paralysis or clumsiness or facial droop.  He states he can still hear the swishing sound on the right side.  He is not bothered by this.    He is compliant with his asa/statin/plavix   The pt is on a statin for cholesterol management.  The pt is on a daily aspirin .   Other AC:  Plavix  The pt is on ACEI, BB for hypertension.   The pt is  on medication for diabetes Tobacco hx:  former   PHYSICAL EXAMINATION:  Today's Vitals   08/01/24 1224 08/01/24 1225  BP: 129/82 134/86  Pulse: 77 77  Temp: 97.9 F (36.6 C)   TempSrc: Temporal   Weight: 193 lb 9.6 oz (87.8 kg)   PainSc: 0-No pain    Body mass index is 27.78 kg/m.   General:  WDWN in NAD; vital signs documented above Gait: Not observed HENT: WNL, normocephalic Pulmonary: normal non-labored breathing Cardiac: regular HR Skin: without rashes Vascular Exam/Pulses: Palpable radial pulses bilaterally Extremities: without open wounds Neurologic: A&O X 3; moving all extremities equally; speech is fluent/normal Psychiatric:  The pt has Normal affect.   Non-Invasive Vascular Imaging:   Carotid Duplex on 08/01/2024 Right:  patent right ICA stent  Left:  40-59% ICA stenosis Vertebrals:  Bilateral vertebral arteries demonstrate antegrade flow.  Subclavians: Normal flow hemodynamics were seen in bilateral subclavian arteries.   Previous Carotid duplex on 03/23/2024: Right: 80-99% ICA stenosis Left:   40-59% ICA stenosis    ASSESSMENT/PLAN:: 68 y.o. male here for follow up carotid artery stenosis and is s/p right TCAR 06/20/2024 by Dr. Gretta for asymptomatic  carotid artery stenosis    -duplex today reveals right ICA stent is patent without stenosis and left ICA stenosis is 40-59%. -discussed s/s of stroke with pt and he understands should he develop any of these sx, he will go to the nearest ER or call 911. -pt will f/u in 9 months with carotid duplex.  He will see Dr. Gretta since he was not in the office today.   -pt will call sooner should he have any issues. -continue statin/asa/plavix    Lucie Apt, [facility] [phone]  Clinic MD:  Sheree

## 2024-08-15 ENCOUNTER — Other Ambulatory Visit: Payer: Self-pay | Admitting: Internal Medicine

## 2024-08-15 MED ORDER — ISOSORBIDE MONONITRATE ER 120 MG PO TB24: ORAL_TABLET | ORAL | 1 refills | Status: AC

## 2024-08-15 NOTE — Telephone Encounter (Signed)
 Scheduled follow up for patient  Refilled Rx   Raford Riggs, MD to [place] Kenneth FALCON, RN  Rosan Dayton BROCKS, DO  Fredirick Newell NOVAK, LPN (Selected Message)    08/15/24  4:37 PM Newell, can we please help refill for Mr. Ndiaye?  Thanks!   TCR

## 2024-08-15 NOTE — Telephone Encounter (Signed)
 Copied from CRM [id number]. Topic: Clinical - Medication Refill >> Aug 15, 2024  1:33 PM Laurier C wrote: Medication: isosorbide  mononitrate (IMDUR ) 120 MG 24 hr tablet  Has the patient contacted their pharmacy? Yes Patient would need to contact his provider  This is the patient's preferred pharmacy:  [id number] - [place] - [address] [place] ([place]) [place] [address] Phone: [phone] Fax: [phone]  Is this the correct pharmacy for this prescription?Yes  Has the prescription been filled recently? No  Is the patient out of the medication? Yes  Has the patient been seen for an appointment in the last year OR does the patient have an upcoming appointment? Yes  Can we respond through MyChart? Yes  Agent: Please be advised that Rx refills may take up to 3 business days. We ask that you follow-up with your pharmacy.

## 2024-09-03 ENCOUNTER — Other Ambulatory Visit: Payer: Self-pay

## 2024-09-03 DIAGNOSIS — E1151 Type 2 diabetes mellitus with diabetic peripheral angiopathy without gangrene: Secondary | ICD-10-CM

## 2024-09-03 MED ORDER — SYNJARDY 5-1000 MG PO TABS: 1.0000 | ORAL_TABLET | Freq: Two times a day (BID) | ORAL | 3 refills | Status: AC

## 2024-09-03 NOTE — Telephone Encounter (Signed)
 Medication sent to pharmacy

## 2024-09-13 ENCOUNTER — Telehealth: Payer: Self-pay

## 2024-09-13 NOTE — Telephone Encounter (Signed)
 Telephone call placed to patient, VM obtained and message left.  Per recall assessment, patient is due for colonoscopy with Dr. Avram.  However, patient is on a blood thinner, no recent OV's noted w/ GI so patient will need an OV with Dr. Avram or APP before scheduling Colonoscopy.  Message left to call back and schedule OV.

## 2024-09-24 ENCOUNTER — Other Ambulatory Visit: Payer: Self-pay

## 2024-09-24 MED ORDER — ONETOUCH DELICA PLUS LANCET33G MISC: 1.0000 | Freq: Two times a day (BID) | 11 refills | Status: AC

## 2024-09-24 NOTE — Telephone Encounter (Signed)
 Medication sent to pharmacy

## 2024-10-15 ENCOUNTER — Other Ambulatory Visit: Payer: Self-pay

## 2024-10-15 ENCOUNTER — Other Ambulatory Visit: Payer: Self-pay | Admitting: Internal Medicine

## 2024-10-15 NOTE — Telephone Encounter (Unsigned)
 Copied from CRM [id number]. Topic: Clinical - Medication Refill >> Oct 15, 2024 10:50 AM Diannia H wrote: Medication: Semaglutide ,0.25 or 0.5MG /DOS, 2 MG/3ML SOPN    Has the patient contacted their pharmacy? Yes (Agent: If no, request that the patient contact the pharmacy for the refill. If patient does not wish to contact the pharmacy document the reason why and proceed with request.) (Agent: If yes, when and what did the pharmacy advise?)  This is the patient's preferred pharmacy:  [facility] - [place] ([place]), [place] - [address] [place] ([place]) [place] [address] Phone: [phone] Fax: [phone]  Is this the correct pharmacy for this prescription? Yes If no, delete pharmacy and type the correct one.   Has the prescription been filled recently? Yes  Is the patient out of the medication? Yes  Has the patient been seen for an appointment in the last year OR does the patient have an upcoming appointment? Yes  Can we respond through MyChart? Yes  Agent: Please be advised that Rx refills may take up to 3 business days. We ask that you follow-up with your pharmacy.

## 2024-10-16 MED ORDER — SEMAGLUTIDE(0.25 OR 0.5MG/DOS) 2 MG/3ML ~~LOC~~ SOPN: 0.5000 mg | PEN_INJECTOR | SUBCUTANEOUS | 1 refills | Status: AC

## 2024-10-16 NOTE — Progress Notes (Signed)
 Jason Velasquez                                          MRN: [id number]   10/16/2024   The VBCI Quality Team Specialist reviewed this patient medical record for the purposes of chart review for care gap closure. The following were reviewed: chart review for care gap closure-kidney health evaluation for diabetes:eGFR  and uACR.    VBCI Quality Team

## 2024-11-06 ENCOUNTER — Telehealth: Payer: Self-pay | Admitting: Pharmacy Technician

## 2024-11-14 ENCOUNTER — Ambulatory Visit: Payer: Self-pay

## 2024-11-26 ENCOUNTER — Ambulatory Visit: Payer: Self-pay | Admitting: Internal Medicine

## 2024-11-29 ENCOUNTER — Ambulatory Visit (HOSPITAL_BASED_OUTPATIENT_CLINIC_OR_DEPARTMENT_OTHER): Admitting: Cardiovascular Disease

## 2024-12-03 ENCOUNTER — Ambulatory Visit (HOSPITAL_BASED_OUTPATIENT_CLINIC_OR_DEPARTMENT_OTHER): Admitting: Cardiovascular Disease
